# Patient Record
Sex: Female | Born: 1990 | Race: Black or African American | Hispanic: No | Smoking: Never smoker
Health system: Southern US, Community
[De-identification: ages and names within clinical notes are randomized; demographics above are authoritative.]

## PROBLEM LIST (undated history)

## (undated) DIAGNOSIS — E039 Hypothyroidism, unspecified: Secondary | ICD-10-CM

## (undated) DIAGNOSIS — E079 Disorder of thyroid, unspecified: Secondary | ICD-10-CM

## (undated) DIAGNOSIS — G932 Benign intracranial hypertension: Secondary | ICD-10-CM

## (undated) DIAGNOSIS — T783XXA Angioneurotic edema, initial encounter: Secondary | ICD-10-CM

## (undated) DIAGNOSIS — I10 Essential (primary) hypertension: Secondary | ICD-10-CM

## (undated) DIAGNOSIS — M329 Systemic lupus erythematosus, unspecified: Secondary | ICD-10-CM

## (undated) DIAGNOSIS — IMO0002 Reserved for concepts with insufficient information to code with codable children: Secondary | ICD-10-CM

## (undated) DIAGNOSIS — N289 Disorder of kidney and ureter, unspecified: Secondary | ICD-10-CM

## (undated) DIAGNOSIS — M3214 Glomerular disease in systemic lupus erythematosus: Secondary | ICD-10-CM

## (undated) HISTORY — PX: INSERTION OF DIALYSIS CATHETER: SHX1324

## (undated) HISTORY — PX: RENAL BIOPSY: SHX156

## (undated) HISTORY — DX: Angioneurotic edema, initial encounter: T78.3XXA

## (undated) HISTORY — PX: SHOULDER SURGERY: SHX246

---
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
## 2010-12-02 ENCOUNTER — Emergency Department (HOSPITAL_BASED_OUTPATIENT_CLINIC_OR_DEPARTMENT_OTHER)
Admission: EM | Admit: 2010-12-02 | Discharge: 2010-12-02 | Disposition: A | Discharge status: Home / Self Care | Payer: 59 | Attending: Emergency Medicine | Admitting: Emergency Medicine

## 2010-12-02 ENCOUNTER — Encounter: Payer: Self-pay | Admitting: *Deleted

## 2010-12-02 DIAGNOSIS — K589 Irritable bowel syndrome without diarrhea: Secondary | ICD-10-CM | POA: Insufficient documentation

## 2010-12-02 DIAGNOSIS — M549 Dorsalgia, unspecified: Secondary | ICD-10-CM | POA: Insufficient documentation

## 2010-12-02 DIAGNOSIS — R109 Unspecified abdominal pain: Secondary | ICD-10-CM | POA: Insufficient documentation

## 2010-12-02 LAB — BASIC METABOLIC PANEL
CO2: 27 mEq/L (ref 19–32)
Calcium: 8.1 mg/dL — ABNORMAL LOW (ref 8.4–10.5)
Creatinine, Ser: 0.6 mg/dL (ref 0.50–1.10)
GFR calc non Af Amer: >60 mL/min (ref >60)

## 2010-12-02 LAB — URINALYSIS, ROUTINE W REFLEX MICROSCOPIC
Hgb urine dipstick: NEGATIVE
Nitrite: NEGATIVE
Protein, ur: >300 mg/dL — AB
Specific Gravity, Urine: 1.028 (ref 1.005–1.030)
Urobilinogen, UA: 0.2 mg/dL (ref 0.0–1.0)

## 2010-12-02 LAB — URINE MICROSCOPIC-ADD ON

## 2010-12-02 LAB — PREGNANCY, URINE: Preg Test, Ur: NEGATIVE

## 2010-12-02 MED ORDER — DICYCLOMINE HCL 10 MG/ML IM SOLN
20.0000 mg | Freq: Once | INTRAMUSCULAR | Status: AC
  Administered 2010-12-02: 20 mg via INTRAMUSCULAR
  Filled 2010-12-02: qty 2

## 2010-12-02 NOTE — ED Notes (Signed)
Abd pain. Gas and bloating. Hx IBS.

## 2010-12-02 NOTE — ED Provider Notes (Signed)
History     CSN: [id number] Arrival date & time: 12/02/2010  2:31 PM  Chief Complaint  Patient presents with  . Abdominal Pain    (Consider location/radiation/quality/duration/timing/severity/associated sxs/prior treatment) HPI Comments: Pt states that she was being treated for ibs and she quit taking her medication because it didn't seem like it was working pt state that she feels like she is having a recurrence or symptoms  Patient is a 20 y.o. female presenting with abdominal pain. The history is provided by the patient. No language interpreter was used.  Abdominal Pain The primary symptoms of the illness include abdominal pain. The primary symptoms of the illness do not include fever, nausea or vomiting. The current episode started more than 2 days ago. The onset of the illness was sudden. The problem has not changed since onset. The patient states that she believes she is currently not pregnant. Additional symptoms associated with the illness include back pain. Symptoms associated with the illness do not include hematuria. Significant associated medical issues include GERD and inflammatory bowel disease.    Past Medical History  Diagnosis Date  . IBS (irritable bowel syndrome)     History reviewed. No pertinent past surgical history.  No family history on file.  History  Substance Use Topics  . Smoking status: Never Smoker   . Smokeless tobacco: Not on file  . Alcohol Use: No    OB History    Grav Para Term Preterm Abortions TAB SAB Ect Mult Living                  Review of Systems  Constitutional: Negative for fever.  Gastrointestinal: Positive for abdominal pain. Negative for nausea and vomiting.  Genitourinary: Negative for hematuria.  Musculoskeletal: Positive for back pain.  All other systems reviewed and are negative.    Allergies  Review of patient's allergies indicates no known allergies.  Home Medications  No current outpatient prescriptions on  file.  BP 113/69  Pulse 77  Temp(Src) 98.2 F (36.8 C) (Oral)  Resp 20  SpO2 100%  Physical Exam  Nursing note and vitals reviewed. Constitutional: She is oriented to person, place, and time. She appears well-developed and well-nourished.  HENT:  Head: Normocephalic and atraumatic.  Neck: Normal range of motion.  Cardiovascular: Normal rate and regular rhythm.   Pulmonary/Chest: Effort normal and breath sounds normal.  Abdominal: Bowel sounds are normal. There is no tenderness.  Musculoskeletal: Normal range of motion.  Neurological: She is alert and oriented to person, place, and time.  Skin: Skin is warm and dry.  Psychiatric: She has a normal mood and affect.    ED Course  Procedures (including critical care time)  Results for orders placed during the hospital encounter of 12/02/10  URINALYSIS, ROUTINE W REFLEX MICROSCOPIC      Component Value Range   Color, Urine YELLOW  YELLOW    Appearance CLEAR  CLEAR    Specific Gravity, Urine 1.028  1.005 - 1.030    pH 7.5  5.0 - 8.0    Glucose, UA NEGATIVE  NEGATIVE (mg/dL)   Hgb urine dipstick NEGATIVE  NEGATIVE    Bilirubin Urine NEGATIVE  NEGATIVE    Ketones, ur NEGATIVE  NEGATIVE (mg/dL)   Protein, ur >300 (*) NEGATIVE (mg/dL)   Urobilinogen, UA 0.2  0.0 - 1.0 (mg/dL)   Nitrite NEGATIVE  NEGATIVE    Leukocytes, UA NEGATIVE  NEGATIVE   PREGNANCY, URINE      Component Value Range  Preg Test, Ur NEGATIVE    BASIC METABOLIC PANEL      Component Value Range   Sodium 139  135 - 145 (mEq/L)   Potassium 3.5  3.5 - 5.1 (mEq/L)   Chloride 108  96 - 112 (mEq/L)   CO2 27  19 - 32 (mEq/L)   Glucose, Bld 107 (*) 70 - 99 (mg/dL)   BUN 8  6 - 23 (mg/dL)   Creatinine, Ser 0.60  0.50 - 1.10 (mg/dL)   Calcium 8.1 (*) 8.4 - 10.5 (mg/dL)   GFR calc non Af Amer >60  >60 (mL/min)   GFR calc Af Amer >60  >60 (mL/min)  URINE MICROSCOPIC-ADD ON      Component Value Range   Squamous Epithelial / LPF RARE  RARE    Bacteria, UA RARE   RARE    No results found.    No diagnosis found.    MDM  Pt is aware of her proteinuria and is being worked up by her pcp:pt in no acute distress:likely related to ibs;abdomen non surgical       Glendell Docker, NP 12/02/10 1557

## 2010-12-02 NOTE — ED Provider Notes (Signed)
Medical screening examination/treatment/procedure(s) were performed by non-physician practitioner and as supervising physician I was immediately available for consultation/collaboration.   Trisha Mangle, MD 12/02/10 1558

## 2010-12-02 NOTE — ED Notes (Signed)
Pt. Aware of results with BMP and Urine test

## 2011-09-04 ENCOUNTER — Emergency Department (HOSPITAL_BASED_OUTPATIENT_CLINIC_OR_DEPARTMENT_OTHER)
Admission: EM | Admit: 2011-09-04 | Discharge: 2011-09-05 | Disposition: A | Discharge status: Home / Self Care | Payer: 59 | Attending: Emergency Medicine | Admitting: Emergency Medicine

## 2011-09-04 ENCOUNTER — Emergency Department (HOSPITAL_BASED_OUTPATIENT_CLINIC_OR_DEPARTMENT_OTHER): Payer: 59

## 2011-09-04 ENCOUNTER — Encounter (HOSPITAL_BASED_OUTPATIENT_CLINIC_OR_DEPARTMENT_OTHER): Payer: Self-pay | Admitting: *Deleted

## 2011-09-04 DIAGNOSIS — R05 Cough: Secondary | ICD-10-CM

## 2011-09-04 DIAGNOSIS — R0982 Postnasal drip: Secondary | ICD-10-CM

## 2011-09-04 DIAGNOSIS — R059 Cough, unspecified: Secondary | ICD-10-CM | POA: Insufficient documentation

## 2011-09-04 DIAGNOSIS — Z79899 Other long term (current) drug therapy: Secondary | ICD-10-CM | POA: Insufficient documentation

## 2011-09-04 NOTE — ED Provider Notes (Signed)
History  This chart was scribed for [name], MD by Lovena Le Day. This patient was seen in room MH02/MH02 and the patient's care was started at 2317.  CSN: [id number]  Arrival date & time 09/04/11  2229   First MD Initiated Contact with Patient 09/04/11 2317      Chief Complaint  Patient presents with  . Cough     Patient is a 21 y.o. female presenting with cough. The history is provided by the patient. No language interpreter was used.  Cough This is a new problem. The current episode started more than 1 week ago. The problem occurs constantly. The problem has not changed since onset.The cough is productive of sputum. There has been no fever. Pertinent negatives include no chest pain, no chills, no headaches, no sore throat, no shortness of breath and no eye redness. She has tried nothing for the symptoms. The treatment provided no relief. Risk factors: road-trip to [facility] and stopped somewhere at a gas-station halfway there.  Her past medical history does not include COPD.    Anne Robinson is a 21 y.o. female who presents to the Emergency Department complaining of one week of gradual onset, gradually worsening, constant productive cough of yellow to clear mucus. She denies having any modifying factors. She reports taking benadryl and Claritin with no relief in her symptoms. She has not seen a MD for the symptoms prior to today. She reports a recent car trip to [place] one week ago but denies any new leg swelling. She is currently on oral contraceptive. She denies wheezing, sore throat, nausea, HA, back pain and urinary symptoms as associated symptoms. She has a h/o IBS. She denies smoking and alcohol use.  Pt followed by [facility]   Past Medical History  Diagnosis Date  . IBS (irritable bowel syndrome)     History reviewed. No pertinent past surgical history.  History reviewed. No pertinent family history.  History  Substance Use Topics  . Smoking status: Never  Smoker   . Smokeless tobacco: Not on file  . Alcohol Use: No    No OB history provided.  Review of Systems  Constitutional: Negative for fever and chills.  HENT: Negative for congestion, sore throat and neck pain.   Eyes: Negative for redness and itching.  Respiratory: Positive for cough. Negative for chest tightness and shortness of breath.   Cardiovascular: Negative for chest pain, palpitations and leg swelling.  Gastrointestinal: Negative for nausea, vomiting, abdominal pain and diarrhea.  Genitourinary: Negative for dysuria, urgency and hematuria.  Musculoskeletal: Negative for back pain.  Skin: Negative for rash.  Neurological: Negative for light-headedness and headaches.  All other systems reviewed and are negative.    Allergies  Review of patient's allergies indicates no known allergies.  Home Medications   Current Outpatient Rx  Name Route Sig Dispense Refill  . DIPHENHYDRAMINE HCL 25 MG PO TABS Oral Take 25 mg by mouth every 6 (six) hours as needed. For cough    . LEVONORGEST-ETH ESTRAD 91-DAY 0.15-0.03 MG PO TABS Oral Take 1 tablet by mouth daily.    Marland Kitchen LISINOPRIL 20 MG PO TABS Oral Take 20 mg by mouth daily.    Marland Kitchen LORATADINE 10 MG PO TABS Oral Take 10 mg by mouth daily.    Marland Kitchen MYCOPHENOLATE MOFETIL 500 MG PO TABS Oral Take 500 mg by mouth 2 (two) times daily.    Marland Kitchen OMEPRAZOLE 20 MG PO CPDR Oral Take 20 mg by mouth daily.    Marland Kitchen  POTASSIUM PO Oral Take 1 tablet by mouth daily as needed. For low potassium    . PREDNISONE 20 MG PO TABS Oral Take 20 mg by mouth daily.    Marland Kitchen VITAMIN D (CHOLECALCIFEROL) PO Oral Take 1 tablet by mouth daily.      Triage Vitals: BP 146/115  Pulse 103  Temp 98.4 F (36.9 C) (Oral)  Resp 18  Ht 4\' 11"  (1.499 m)  Wt 230 lb (104.327 kg)  BMI 46.45 kg/m2  SpO2 100%  LMP 08/12/2011  Physical Exam  Nursing note and vitals reviewed. Constitutional: She is oriented to person, place, and time. She appears well-developed and well-nourished. No  distress.  HENT:  Head: Normocephalic.  Right Ear: External ear normal.  Mouth/Throat: Oropharynx is clear and moist. No oropharyngeal exudate.       [place] noted, moist mucus membranes, normal TMs bilaterally  Eyes: Conjunctivae and EOM are normal. Pupils are equal, round, and reactive to light.       Cobblestoning of posterior oropharynx  Neck: Normal range of motion. Neck supple. No tracheal deviation present.  Cardiovascular: Normal rate and regular rhythm.  Exam reveals no gallop and no friction rub.   No murmur heard. Pulmonary/Chest: Effort normal and breath sounds normal. No respiratory distress. She has no wheezes. She exhibits no tenderness.  Abdominal: Soft. Bowel sounds are normal. She exhibits no distension. There is no tenderness. There is no rebound and no guarding.  Musculoskeletal: Normal range of motion. She exhibits no edema.  Lymphadenopathy:    She has no cervical adenopathy.  Neurological: She is alert and oriented to person, place, and time. She has normal reflexes. No cranial nerve deficit.  Skin: Skin is warm and dry.  Psychiatric: She has a normal mood and affect. Her behavior is normal.    ED Course  Procedures (including critical care time)  DIAGNOSTIC STUDIES: Oxygen Saturation is 100% on room air, normal by my interpretation.    COORDINATION OF CARE: 12:15AM-Discussed treatment plan which includes a chest x-ray and blood work with pt and pt agreed to plan.   Labs Reviewed  CBC WITH DIFFERENTIAL - Abnormal; Notable for the following:    Hemoglobin 11.8 (*)     HCT 35.9 (*)     All other components within normal limits  BASIC METABOLIC PANEL - Abnormal; Notable for the following:    Glucose, Bld 116 (*)     All other components within normal limits  PREGNANCY, URINE  D-DIMER, QUANTITATIVE   Dg Chest 2 View  09/04/2011  *RADIOLOGY REPORT*  Clinical Data: Cough.  CHEST - 2 VIEW  Comparison: None.  Findings: Heart and mediastinal contours are  within normal limits. No focal opacities or effusions.  No acute bony abnormality.  IMPRESSION: No active cardiopulmonary disease.  Original Report Authenticated By: Raelyn Number, M.D.     No diagnosis found.    MDM  Pt's symptoms are consistent with post nasal drip/   Advised pt to follow up with PCP and return if she develops fevers, chills, hemoptysis, chest pain or shortness of breath.  Patient verbalizes understanding and agrees to follow up      I personally performed the services described in this documentation, which was scribed in my presence. The recorded information has been reviewed and considered.    Carlisle Beers, MD 09/05/11 [phone]

## 2011-09-04 NOTE — ED Notes (Signed)
Pt has dry cough in triage

## 2011-09-04 NOTE — ED Notes (Signed)
Pt has no hx of asthma or COPD but presented to the ED with a slight tickle in her throat.

## 2011-09-04 NOTE — ED Notes (Signed)
Pt presents to ED today with cough for the last week.  Pt has tried several OTC meds with no relief in sx.  Pt states this happens every year but seems worse this time.

## 2011-09-05 LAB — CBC WITH DIFFERENTIAL/PLATELET
Basophils Relative: 0 % (ref 0–1)
Eosinophils Relative: 0 % (ref 0–5)
Hemoglobin: 11.8 g/dL — ABNORMAL LOW (ref 12.0–15.0)
Lymphs Abs: 3 10*3/uL (ref 0.7–4.0)
MCH: 29.6 pg (ref 26.0–34.0)
MCHC: 32.9 g/dL (ref 30.0–36.0)
MCV: 90.2 fL (ref 78.0–100.0)
Monocytes Absolute: 0.7 10*3/uL (ref 0.1–1.0)
Platelets: 274 10*3/uL (ref 150–400)
RBC: 3.98 MIL/uL (ref 3.87–5.11)

## 2011-09-05 LAB — BASIC METABOLIC PANEL
BUN: 13 mg/dL (ref 6–23)
CO2: 27 mEq/L (ref 19–32)
Chloride: 105 mEq/L (ref 96–112)
Creatinine, Ser: 0.8 mg/dL (ref 0.50–1.10)

## 2011-09-05 MED ORDER — BENZONATATE 100 MG PO CAPS: 100.0000 mg | ORAL_CAPSULE | Freq: Three times a day (TID) | ORAL | Status: AC

## 2011-09-05 MED ORDER — FLUTICASONE PROPIONATE 50 MCG/ACT NA SUSP: 2.0000 | Freq: Every day | NASAL | Status: DC

## 2011-09-05 NOTE — Discharge Instructions (Signed)
Cool Mist Vaporizers Vaporizers may help relieve the symptoms of a cough and cold. By adding water to the air, mucus may become thinner and less sticky. This makes it easier to breathe and cough up secretions. Vaporizers have not been proven to show they help with colds. You should not use a vaporizer if you are allergic to mold. Cool mist vaporizers do not cause serious burns like hot mist vaporizers ("steamers"). HOME CARE INSTRUCTIONS  Follow the package instructions for your vaporizer.   Use a vaporizer that holds a large volume of water (1 to 2 gallons [5.7 to 7.5 liters]).   Do not use anything other than distilled water in the vaporizer.   Do not run the vaporizer all of the time. This can cause mold or bacteria to grow in the vaporizer.   Clean the vaporizer after each time you use it.   Clean and dry the vaporizer well before you store it.   Stop using a vaporizer if you develop worsening respiratory symptoms.  Document Released: 11/19/2003 Document Revised: 02/10/2011 Document Reviewed: 10/16/2008 [facility] Patient Information 2012 [place].Cough, Adult  A cough is a reflex. It helps you clear your throat and airways. A cough can help heal your body. A cough can last 2 or 3 weeks (acute) or may last more than 8 weeks (chronic). Some common causes of a cough can include an infection, allergy, or a cold. HOME CARE  Only take medicine as told by your doctor.   If given, take your medicines (antibiotics) as told. Finish them even if you start to feel better.   Use a cold steam vaporizer or humidier in your home. This can help loosen thick spit (secretions).   Sleep so you are almost sitting up (semi-upright). Use pillows to do this. This helps reduce coughing.   Rest as needed.   Stop smoking if you smoke.  GET HELP RIGHT AWAY IF:  You have yellowish-white fluid (pus) in your thick spit.   Your cough gets worse.   Your medicine does not reduce coughing, and you are  losing sleep.   You cough up blood.   You have trouble breathing.   Your pain gets worse and medicine does not help.   You have a fever.  MAKE SURE YOU:   Understand these instructions.   Will watch your condition.   Will get help right away if you are not doing well or get worse.  Document Released: 11/04/2010 Document Revised: 02/10/2011 Document Reviewed: 11/04/2010 [facility] Patient Information 2012 [place].

## 2011-10-06 ENCOUNTER — Emergency Department (HOSPITAL_BASED_OUTPATIENT_CLINIC_OR_DEPARTMENT_OTHER)
Admission: EM | Admit: 2011-10-06 | Discharge: 2011-10-06 | Disposition: A | Discharge status: Home / Self Care | Payer: Self-pay | Attending: Emergency Medicine | Admitting: Emergency Medicine

## 2011-10-06 ENCOUNTER — Encounter (HOSPITAL_BASED_OUTPATIENT_CLINIC_OR_DEPARTMENT_OTHER): Payer: Self-pay | Admitting: Emergency Medicine

## 2011-10-06 DIAGNOSIS — E860 Dehydration: Secondary | ICD-10-CM | POA: Insufficient documentation

## 2011-10-06 DIAGNOSIS — R112 Nausea with vomiting, unspecified: Secondary | ICD-10-CM | POA: Insufficient documentation

## 2011-10-06 DIAGNOSIS — M329 Systemic lupus erythematosus, unspecified: Secondary | ICD-10-CM | POA: Insufficient documentation

## 2011-10-06 DIAGNOSIS — K589 Irritable bowel syndrome without diarrhea: Secondary | ICD-10-CM | POA: Insufficient documentation

## 2011-10-06 HISTORY — DX: Systemic lupus erythematosus, unspecified: M32.9

## 2011-10-06 HISTORY — DX: Reserved for concepts with insufficient information to code with codable children: IMO0002

## 2011-10-06 LAB — URINALYSIS, ROUTINE W REFLEX MICROSCOPIC
Glucose, UA: NEGATIVE mg/dL
Leukocytes, UA: NEGATIVE
Nitrite: NEGATIVE
Protein, ur: >300 mg/dL — AB
Urobilinogen, UA: 1 mg/dL (ref 0.0–1.0)

## 2011-10-06 LAB — COMPREHENSIVE METABOLIC PANEL
Albumin: 1.2 g/dL — ABNORMAL LOW (ref 3.5–5.2)
BUN: 10 mg/dL (ref 6–23)
Chloride: 108 mEq/L (ref 96–112)
Creatinine, Ser: 0.8 mg/dL (ref 0.50–1.10)
Total Bilirubin: 0.2 mg/dL — ABNORMAL LOW (ref 0.3–1.2)
Total Protein: 6.1 g/dL (ref 6.0–8.3)

## 2011-10-06 LAB — CBC WITH DIFFERENTIAL/PLATELET
Basophils Relative: 0 % (ref 0–1)
Eosinophils Absolute: 0 10*3/uL (ref 0.0–0.7)
HCT: 43.4 % (ref 36.0–46.0)
Hemoglobin: 14.5 g/dL (ref 12.0–15.0)
MCH: 29.2 pg (ref 26.0–34.0)
MCHC: 33.4 g/dL (ref 30.0–36.0)
MCV: 87.5 fL (ref 78.0–100.0)
Monocytes Absolute: 0.4 10*3/uL (ref 0.1–1.0)
Neutro Abs: 2.3 10*3/uL (ref 1.7–7.7)

## 2011-10-06 LAB — PREGNANCY, URINE: Preg Test, Ur: NEGATIVE

## 2011-10-06 LAB — URINE MICROSCOPIC-ADD ON

## 2011-10-06 MED ORDER — KETOROLAC TROMETHAMINE 30 MG/ML IJ SOLN
30.0000 mg | Freq: Once | INTRAMUSCULAR | Status: AC
  Administered 2011-10-06: 30 mg via INTRAVENOUS
  Filled 2011-10-06: qty 1

## 2011-10-06 MED ORDER — PROMETHAZINE HCL 25 MG PO TABS: 25.0000 mg | ORAL_TABLET | Freq: Four times a day (QID) | ORAL | Status: DC | PRN

## 2011-10-06 MED ORDER — SODIUM CHLORIDE 0.9 % IV SOLN
Freq: Once | INTRAVENOUS | Status: AC
  Administered 2011-10-06: 15:00:00 via INTRAVENOUS

## 2011-10-06 MED ORDER — ONDANSETRON HCL 4 MG/2ML IJ SOLN
4.0000 mg | Freq: Once | INTRAMUSCULAR | Status: AC
  Administered 2011-10-06: 4 mg via INTRAVENOUS
  Filled 2011-10-06: qty 2

## 2011-10-06 NOTE — ED Notes (Signed)
States yesterday had episode of n/v without diarrhea.  Thinks its because she stopped medicine.  States she has not vomited today but has nausea.

## 2011-10-06 NOTE — ED Provider Notes (Signed)
History     CSN: [id number]  Arrival date & time 10/06/11  1401   First MD Initiated Contact with Patient 10/06/11 1414      Chief Complaint  Patient presents with  . Emesis    (Consider location/radiation/quality/duration/timing/severity/associated sxs/prior treatment) Patient is a 21 y.o. female presenting with vomiting. The history is provided by the patient. No language interpreter was used.  Emesis  This is a new problem. The current episode started yesterday. The problem occurs 5 to 10 times per day. The problem has been gradually improving. The emesis has an appearance of stomach contents. There has been no fever. Pertinent negatives include no abdominal pain, no chills and no diarrhea.  Pt complains of feeling bloated and decreased urine output.  Pt reports she has had a kidney biopsy.   Pt reports she is followed by a nephrologist but is not currently being seen.   Pt stopped taking her medications.  Pt thinks she may be sick because she stop medications.  (Pt now has rx's)   Past Medical History  Diagnosis Date  . IBS (irritable bowel syndrome)   . Lupus     Past Surgical History  Procedure Date  . Renal biopsy     No family history on file.  History  Substance Use Topics  . Smoking status: Never Smoker   . Smokeless tobacco: Not on file  . Alcohol Use: No    OB History    Grav Para Term Preterm Abortions TAB SAB Ect Mult Living                  Review of Systems  Constitutional: Negative for chills.  Gastrointestinal: Positive for vomiting. Negative for abdominal pain and diarrhea.  Genitourinary: Positive for decreased urine volume.  All other systems reviewed and are negative.    Allergies  Review of patient's allergies indicates no known allergies.  Home Medications   Current Outpatient Rx  Name Route Sig Dispense Refill  . DIPHENHYDRAMINE HCL 25 MG PO TABS Oral Take 25 mg by mouth every 6 (six) hours as needed. For cough    . FLUTICASONE  PROPIONATE 50 MCG/ACT NA SUSP Nasal Place 2 sprays into the nose daily. 16 g 0  . LEVONORGEST-ETH ESTRAD 91-DAY 0.15-0.03 MG PO TABS Oral Take 1 tablet by mouth daily.    Marland Kitchen LISINOPRIL 20 MG PO TABS Oral Take 20 mg by mouth daily.    Marland Kitchen LORATADINE 10 MG PO TABS Oral Take 10 mg by mouth daily.    Marland Kitchen MYCOPHENOLATE MOFETIL 500 MG PO TABS Oral Take 500 mg by mouth 2 (two) times daily.    Marland Kitchen OMEPRAZOLE 20 MG PO CPDR Oral Take 20 mg by mouth daily.    Marland Kitchen POTASSIUM PO Oral Take 1 tablet by mouth daily as needed. For low potassium    . PREDNISONE 20 MG PO TABS Oral Take 20 mg by mouth daily.    Marland Kitchen VITAMIN D (CHOLECALCIFEROL) PO Oral Take 1 tablet by mouth daily.      BP 117/102  Pulse 73  Temp 98.2 F (36.8 C) (Oral)  Resp 16  Ht 4\' 11"  (1.499 m)  Wt 244 lb 8 oz (110.904 kg)  BMI 49.38 kg/m2  SpO2 97%  LMP 09/19/2011  Physical Exam  Nursing note and vitals reviewed. Constitutional: She is oriented to person, place, and time. She appears well-developed and well-nourished.  HENT:  Head: Normocephalic and atraumatic.  Right Ear: External ear normal.  Left Ear: External  ear normal.  Nose: Nose normal.  Mouth/Throat: Oropharynx is clear and moist.  Eyes: Conjunctivae and EOM are normal. Pupils are equal, round, and reactive to light.  Neck: Normal range of motion. Neck supple.  Cardiovascular: Normal rate and normal heart sounds.   Pulmonary/Chest: Effort normal and breath sounds normal.  Abdominal: Soft. There is no tenderness.  Musculoskeletal: Normal range of motion.  Neurological: She is alert and oriented to person, place, and time. She has normal reflexes.  Skin: Skin is warm and dry.  Psychiatric: She has a normal mood and affect.    ED Course  Procedures (including critical care time)  Labs Reviewed - No data to display No results found.   No diagnosis found.  Results for orders placed during the hospital encounter of 10/06/11  CBC WITH DIFFERENTIAL      Component Value  Range   WBC 4.1  4.0 - 10.5 K/uL   RBC 4.96  3.87 - 5.11 MIL/uL   Hemoglobin 14.5  12.0 - 15.0 g/dL   HCT 43.4  36.0 - 46.0 %   MCV 87.5  78.0 - 100.0 fL   MCH 29.2  26.0 - 34.0 pg   MCHC 33.4  30.0 - 36.0 g/dL   RDW 14.1  11.5 - 15.5 %   Platelets 289  150 - 400 K/uL   Neutrophils Relative 57  43 - 77 %   Lymphocytes Relative 34  12 - 46 %   Monocytes Relative 9  3 - 12 %   Eosinophils Relative 0  0 - 5 %   Basophils Relative 0  0 - 1 %   Neutro Abs 2.3  1.7 - 7.7 K/uL   Lymphs Abs 1.4  0.7 - 4.0 K/uL   Monocytes Absolute 0.4  0.1 - 1.0 K/uL   Eosinophils Absolute 0.0  0.0 - 0.7 K/uL   Basophils Absolute 0.0  0.0 - 0.1 K/uL  COMPREHENSIVE METABOLIC PANEL      Component Value Range   Sodium 141  135 - 145 mEq/L   Potassium 3.8  3.5 - 5.1 mEq/L   Chloride 108  96 - 112 mEq/L   CO2 25  19 - 32 mEq/L   Glucose, Bld 97  70 - 99 mg/dL   BUN 10  6 - 23 mg/dL   Creatinine, Ser 0.80  0.50 - 1.10 mg/dL   Calcium 8.3 (*) 8.4 - 10.5 mg/dL   Total Protein 6.1  6.0 - 8.3 g/dL   Albumin 1.2 (*) 3.5 - 5.2 g/dL   AST 19  0 - 37 U/L   ALT 10  0 - 35 U/L   Alkaline Phosphatase 82  39 - 117 U/L   Total Bilirubin 0.2 (*) 0.3 - 1.2 mg/dL   GFR calc non Af Amer >90  >90 mL/min   GFR calc Af Amer >90  >90 mL/min  URINALYSIS, ROUTINE W REFLEX MICROSCOPIC      Component Value Range   Color, Urine AMBER (*) YELLOW   APPearance CLEAR  CLEAR   Specific Gravity, Urine >1.046 (*) 1.005 - 1.030   pH 7.0  5.0 - 8.0   Glucose, UA NEGATIVE  NEGATIVE mg/dL   Hgb urine dipstick SMALL (*) NEGATIVE   Bilirubin Urine SMALL (*) NEGATIVE   Ketones, ur 15 (*) NEGATIVE mg/dL   Protein, ur >300 (*) NEGATIVE mg/dL   Urobilinogen, UA 1.0  0.0 - 1.0 mg/dL   Nitrite NEGATIVE  NEGATIVE   Leukocytes, UA NEGATIVE  NEGATIVE  PREGNANCY, URINE      Component Value Range   Preg Test, Ur NEGATIVE  NEGATIVE  URINE MICROSCOPIC-ADD ON      Component Value Range   Squamous Epithelial / LPF RARE  RARE   RBC / HPF 3-6   <3 RBC/hpf   Bacteria, UA RARE  RARE   No results found.   MDM  Pt given 2 liters IV fluids, pain and nausea have resolved.  Pt advised she needs to continue her medications.   Pt will see her Md for recheck        Fransico Meadow, [place] 10/06/11 1815

## 2011-10-06 NOTE — ED Notes (Signed)
Pt states that she is unable to give urine at this time d/t dehydration

## 2011-10-07 NOTE — ED Provider Notes (Signed)
Medical screening examination/treatment/procedure(s) were performed by non-physician practitioner and as supervising physician I was immediately available for consultation/collaboration.   Ezequiel Essex, MD 10/07/11 [phone]

## 2011-10-09 ENCOUNTER — Emergency Department (HOSPITAL_BASED_OUTPATIENT_CLINIC_OR_DEPARTMENT_OTHER): Payer: Self-pay

## 2011-10-09 ENCOUNTER — Encounter (HOSPITAL_BASED_OUTPATIENT_CLINIC_OR_DEPARTMENT_OTHER): Payer: Self-pay | Admitting: *Deleted

## 2011-10-09 ENCOUNTER — Emergency Department (HOSPITAL_BASED_OUTPATIENT_CLINIC_OR_DEPARTMENT_OTHER)
Admission: EM | Admit: 2011-10-09 | Discharge: 2011-10-09 | Disposition: A | Discharge status: Home / Self Care | Payer: Self-pay | Attending: Emergency Medicine | Admitting: Emergency Medicine

## 2011-10-09 DIAGNOSIS — K8020 Calculus of gallbladder without cholecystitis without obstruction: Secondary | ICD-10-CM | POA: Insufficient documentation

## 2011-10-09 DIAGNOSIS — M329 Systemic lupus erythematosus, unspecified: Secondary | ICD-10-CM | POA: Insufficient documentation

## 2011-10-09 DIAGNOSIS — K589 Irritable bowel syndrome without diarrhea: Secondary | ICD-10-CM | POA: Insufficient documentation

## 2011-10-09 LAB — CBC WITH DIFFERENTIAL/PLATELET
Basophils Absolute: 0 10*3/uL (ref 0.0–0.1)
Basophils Relative: 0 % (ref 0–1)
MCHC: 33.7 g/dL (ref 30.0–36.0)
Monocytes Absolute: 0.3 10*3/uL (ref 0.1–1.0)
Neutro Abs: 4.2 10*3/uL (ref 1.7–7.7)
Neutrophils Relative %: 77 % (ref 43–77)
RDW: 13.8 % (ref 11.5–15.5)

## 2011-10-09 LAB — URINE MICROSCOPIC-ADD ON

## 2011-10-09 LAB — COMPREHENSIVE METABOLIC PANEL
AST: 22 U/L (ref 0–37)
Albumin: 1.2 g/dL — ABNORMAL LOW (ref 3.5–5.2)
Chloride: 106 mEq/L (ref 96–112)
Creatinine, Ser: 0.7 mg/dL (ref 0.50–1.10)
Potassium: 3.8 mEq/L (ref 3.5–5.1)
Total Bilirubin: 0.1 mg/dL — ABNORMAL LOW (ref 0.3–1.2)

## 2011-10-09 LAB — URINALYSIS, ROUTINE W REFLEX MICROSCOPIC
Bilirubin Urine: NEGATIVE
Glucose, UA: NEGATIVE mg/dL
Ketones, ur: 15 mg/dL — AB
pH: 6.5 (ref 5.0–8.0)

## 2011-10-09 MED ORDER — ONDANSETRON HCL 4 MG/2ML IJ SOLN
4.0000 mg | Freq: Once | INTRAMUSCULAR | Status: AC
Start: 2011-10-09 — End: 2011-10-09
  Administered 2011-10-09: 4 mg via INTRAVENOUS
  Filled 2011-10-09: qty 2

## 2011-10-09 MED ORDER — GI COCKTAIL ~~LOC~~
30.0000 mL | Freq: Once | ORAL | Status: AC
  Administered 2011-10-09: 30 mL via ORAL
  Filled 2011-10-09: qty 30

## 2011-10-09 MED ORDER — HYDROCODONE-ACETAMINOPHEN 5-500 MG PO TABS: 1.0000 | ORAL_TABLET | Freq: Four times a day (QID) | ORAL | Status: AC | PRN

## 2011-10-09 NOTE — ED Provider Notes (Signed)
History     CSN: [id number]  Arrival date & time 10/09/11  1925   First MD Initiated Contact with Patient 10/09/11 1944      Chief Complaint  Patient presents with  . Abdominal Pain    (Consider location/radiation/quality/duration/timing/severity/associated sxs/prior treatment) HPI Comments: Pt states that she was seen a couple of days ago with epigastric pain and vomiting:pt states that she has been eating very mild food but she is continuing to have the symptoms:pt states that she has not had any fever:pt states that she sees a nephrologist for mild lupus  Patient is a 21 y.o. female presenting with abdominal pain. The history is provided by the patient. No language interpreter was used.  Abdominal Pain The primary symptoms of the illness include abdominal pain and nausea. The primary symptoms of the illness do not include vomiting, diarrhea or dysuria. The current episode started more than 2 days ago. The onset of the illness was gradual. The problem has not changed since onset. The patient states that she believes she is currently not pregnant. The patient has not had a change in bowel habit. Significant associated medical issues include GERD.    Past Medical History  Diagnosis Date  . IBS (irritable bowel syndrome)   . Lupus     Past Surgical History  Procedure Date  . Renal biopsy     History reviewed. No pertinent family history.  History  Substance Use Topics  . Smoking status: Never Smoker   . Smokeless tobacco: Not on file  . Alcohol Use: No    OB History    Grav Para Term Preterm Abortions TAB SAB Ect Mult Living                  Review of Systems  Constitutional: Negative.   Respiratory: Negative.   Cardiovascular: Negative.   Gastrointestinal: Positive for nausea and abdominal pain. Negative for vomiting and diarrhea.  Genitourinary: Negative for dysuria.    Allergies  Review of patient's allergies indicates no known allergies.  Home Medications    Current Outpatient Rx  Name Route Sig Dispense Refill  . FLUTICASONE PROPIONATE 50 MCG/ACT NA SUSP Nasal Place 2 sprays into the nose daily. 16 g 0  . LEVONORGEST-ETH ESTRAD 91-DAY 0.15-0.03 MG PO TABS Oral Take 1 tablet by mouth daily.    Marland Kitchen LORATADINE 10 MG PO TABS Oral Take 10 mg by mouth daily.    Marland Kitchen OMEPRAZOLE 20 MG PO CPDR Oral Take 20 mg by mouth daily.    Marland Kitchen GAS-X PO Oral Take 1 tablet by mouth daily as needed. For flatulence.      BP 121/76  Pulse 92  Temp 98.1 F (36.7 C) (Oral)  Resp 20  Ht 4\' 11"  (1.499 m)  Wt 244 lb (110.678 kg)  BMI 49.28 kg/m2  SpO2 98%  LMP 09/19/2011  Physical Exam  Nursing note and vitals reviewed. Constitutional: She is oriented to person, place, and time. She appears well-developed and well-nourished.  HENT:  Head: Atraumatic.  Eyes: Conjunctivae and EOM are normal.  Neck: Neck supple.  Cardiovascular: Normal rate and regular rhythm.   Pulmonary/Chest: Effort normal and breath sounds normal.  Abdominal: Soft. Bowel sounds are normal. There is tenderness in the right upper quadrant and epigastric area.  Musculoskeletal: Normal range of motion.  Neurological: She is alert and oriented to person, place, and time.  Skin: Skin is warm and dry.  Psychiatric: She has a normal mood and affect.    ED  Course  Procedures (including critical care time)  Labs Reviewed  URINALYSIS, ROUTINE W REFLEX MICROSCOPIC - Abnormal; Notable for the following:    Hgb urine dipstick MODERATE (*)     Ketones, ur 15 (*)     Protein, ur >300 (*)     All other components within normal limits  COMPREHENSIVE METABOLIC PANEL - Abnormal; Notable for the following:    Glucose, Bld 106 (*)     Calcium 8.3 (*)     Total Protein 5.9 (*)     Albumin 1.2 (*)     Total Bilirubin 0.1 (*)     All other components within normal limits  URINE MICROSCOPIC-ADD ON - Abnormal; Notable for the following:    Squamous Epithelial / LPF FEW (*)     Bacteria, UA MANY (*)      Casts HYALINE CASTS (*)     All other components within normal limits  PREGNANCY, URINE  CBC WITH DIFFERENTIAL  LIPASE, BLOOD   US Abdomen Complete  10/09/2011  *RADIOLOGY REPORT*  Clinical Data:  Right upper quadrant pain  ABDOMINAL ULTRASOUND COMPLETE  Comparison:  None.  Findings:  Gallbladder:  Echogenic shadowing gallstone noted measuring 12 mm. No associated Murphy's sign or wall thickening.  No pericholecystic fluid.  Wall thickness measures 2 mm.  Common Bile Duct:  Within normal limits in caliber.  Liver: No focal mass lesion identified.  Within normal limits in parenchymal echogenicity.  IVC:  Appears normal.  Pancreas:  No abnormality identified.  Spleen:  Within normal limits in size and echotexture.  Right kidney:  Normal in size and parenchymal echogenicity.  No evidence of mass or hydronephrosis.  Left kidney:  Normal in size and parenchymal echogenicity.  No evidence of mass or hydronephrosis.  Abdominal Aorta:  No aneurysm identified.  IMPRESSION: Limited exam because of patient body habitus and bowel gas.  Cholelithiasis.  No other acute finding.  Original Report Authenticated By: Jerilynn Mages. Daryll Brod, M.D.     1. Cholelithiases       MDM  Pt comfortable at this time:urine is consistent with previous and pt is follow by neurology:pt is okay to follow up with sugery        Glendell Docker, NP 10/09/11 2240

## 2011-10-09 NOTE — ED Notes (Signed)
Patient reports that she has abd pain after every time she eats.

## 2011-10-09 NOTE — ED Notes (Signed)
Pt presents to ED today without indwelling IV catheter.  Not charted as removed from previous [facility] encounter.

## 2011-10-09 NOTE — ED Provider Notes (Signed)
History/physical exam/procedure(s) were performed by non-physician practitioner and as supervising physician I was immediately available for consultation/collaboration. I have reviewed all notes and am in agreement with care and plan.   Shaune Pollack, MD 10/09/11 [phone]

## 2011-10-09 NOTE — ED Notes (Signed)
Pt presents to ED today with epigastric upper abdominal pain since Thurs.  Pt has been seen here recently for same

## 2012-07-17 ENCOUNTER — Encounter (HOSPITAL_BASED_OUTPATIENT_CLINIC_OR_DEPARTMENT_OTHER): Payer: Self-pay

## 2012-07-17 ENCOUNTER — Emergency Department (HOSPITAL_BASED_OUTPATIENT_CLINIC_OR_DEPARTMENT_OTHER)
Admission: EM | Admit: 2012-07-17 | Discharge: 2012-07-17 | Disposition: A | Discharge status: Home / Self Care | Payer: 59 | Attending: Emergency Medicine | Admitting: Emergency Medicine

## 2012-07-17 DIAGNOSIS — IMO0002 Reserved for concepts with insufficient information to code with codable children: Secondary | ICD-10-CM | POA: Insufficient documentation

## 2012-07-17 DIAGNOSIS — J029 Acute pharyngitis, unspecified: Secondary | ICD-10-CM | POA: Insufficient documentation

## 2012-07-17 DIAGNOSIS — J3489 Other specified disorders of nose and nasal sinuses: Secondary | ICD-10-CM | POA: Insufficient documentation

## 2012-07-17 DIAGNOSIS — J019 Acute sinusitis, unspecified: Secondary | ICD-10-CM | POA: Insufficient documentation

## 2012-07-17 DIAGNOSIS — Z79899 Other long term (current) drug therapy: Secondary | ICD-10-CM | POA: Insufficient documentation

## 2012-07-17 DIAGNOSIS — Z8739 Personal history of other diseases of the musculoskeletal system and connective tissue: Secondary | ICD-10-CM | POA: Insufficient documentation

## 2012-07-17 DIAGNOSIS — Z87448 Personal history of other diseases of urinary system: Secondary | ICD-10-CM | POA: Insufficient documentation

## 2012-07-17 DIAGNOSIS — Z8719 Personal history of other diseases of the digestive system: Secondary | ICD-10-CM | POA: Insufficient documentation

## 2012-07-17 HISTORY — DX: Glomerular disease in systemic lupus erythematosus: M32.14

## 2012-07-17 HISTORY — DX: Disorder of kidney and ureter, unspecified: N28.9

## 2012-07-17 MED ORDER — AZITHROMYCIN 250 MG PO TABS: 250.0000 mg | ORAL_TABLET | Freq: Every day | ORAL | Status: DC

## 2012-07-17 NOTE — ED Provider Notes (Signed)
History     CSN: [id number]  Arrival date & time 07/17/12  [id number]   First MD Initiated Contact with Patient 07/17/12 0914      Chief Complaint  Patient presents with  . Facial Pain    (Consider location/radiation/quality/duration/timing/severity/associated sxs/prior treatment) HPI Comments: Patient presents with a one month history of sinus congestion, facial pain.  She has seasonal allergies but is getting no relief with otc meds, allergy pills.  No fevers or chills.  Patient is a 22 y.o. female presenting with URI. The history is provided by the patient.  URI Presenting symptoms: congestion, facial pain and sore throat   Severity:  Moderate Duration:  1 month Timing:  Constant Progression:  Worsening Chronicity:  New Relieved by:  Nothing Worsened by:  Nothing tried Ineffective treatments: loratadine. Associated symptoms: no neck pain     Past Medical History  Diagnosis Date  . IBS (irritable bowel syndrome)   . Lupus   . Renal disorder   . Lupus nephritis     Past Surgical History  Procedure Laterality Date  . Renal biopsy      No family history on file.  History  Substance Use Topics  . Smoking status: Never Smoker   . Smokeless tobacco: Not on file  . Alcohol Use: No    OB History   Grav Para Term Preterm Abortions TAB SAB Ect Mult Living                  Review of Systems  HENT: Positive for congestion and sore throat. Negative for neck pain.   All other systems reviewed and are negative.    Allergies  Review of patient's allergies indicates no known allergies.  Home Medications   Current Outpatient Rx  Name  Route  Sig  Dispense  Refill  . lisinopril (PRINIVIL,ZESTRIL) 20 MG tablet   Oral   Take 20 mg by mouth daily.         . predniSONE (DELTASONE) 10 MG tablet   Oral   Take 10 mg by mouth daily.         . fluticasone (FLONASE) 50 MCG/ACT nasal spray   Nasal   Place 2 sprays into the nose daily.   16 g   0   .  levonorgestrel-ethinyl estradiol (SEASONALE,INTROVALE,JOLESSA) 0.15-0.03 MG tablet   Oral   Take 1 tablet by mouth daily.         Marland Kitchen loratadine (CLARITIN) 10 MG tablet   Oral   Take 10 mg by mouth daily.         Marland Kitchen omeprazole (PRILOSEC) 20 MG capsule   Oral   Take 20 mg by mouth daily.         . Simethicone (GAS-X PO)   Oral   Take 1 tablet by mouth daily as needed. For flatulence.           BP 135/76  Pulse 97  Temp(Src) 98 F (36.7 C) (Oral)  Resp 18  SpO2 98%  LMP 06/29/2012  Physical Exam  Nursing note and vitals reviewed. Constitutional: She is oriented to person, place, and time. She appears well-developed and well-nourished. No distress.  HENT:  Head: Normocephalic and atraumatic.  Mouth/Throat: Oropharynx is clear and moist.  Bilateral TM's are clear.  There is ttp over the frontal and maxillary sinuses.  Neck: Normal range of motion. Neck supple.  Cardiovascular: Normal rate and regular rhythm.  Exam reveals no gallop and no friction rub.   No murmur  heard. Pulmonary/Chest: Effort normal and breath sounds normal. No respiratory distress. She has no wheezes.  Abdominal: Soft. Bowel sounds are normal. She exhibits no distension. There is no tenderness.  Musculoskeletal: Normal range of motion.  Neurological: She is alert and oriented to person, place, and time.  Skin: Skin is warm and dry. She is not diaphoretic.    ED Course  Procedures (including critical care time)  Labs Reviewed - No data to display No results found.   No diagnosis found.    MDM  Will treat as sinusitis.          Veryl Speak, MD 07/17/12 [phone]

## 2012-07-17 NOTE — ED Notes (Signed)
Pt states she is having pain in sinus area with post-nasal drainage. Pain radiates from sinus area to throat/neck; with "throat scratchyness." Pt states she has been having congestion with mild episodes of coughing.

## 2012-12-10 ENCOUNTER — Encounter (HOSPITAL_BASED_OUTPATIENT_CLINIC_OR_DEPARTMENT_OTHER): Payer: Self-pay | Admitting: *Deleted

## 2012-12-10 ENCOUNTER — Emergency Department (HOSPITAL_BASED_OUTPATIENT_CLINIC_OR_DEPARTMENT_OTHER)
Admission: EM | Admit: 2012-12-10 | Discharge: 2012-12-10 | Disposition: A | Discharge status: Home / Self Care | Payer: 59 | Attending: Emergency Medicine | Admitting: Emergency Medicine

## 2012-12-10 DIAGNOSIS — Z8719 Personal history of other diseases of the digestive system: Secondary | ICD-10-CM | POA: Insufficient documentation

## 2012-12-10 DIAGNOSIS — IMO0002 Reserved for concepts with insufficient information to code with codable children: Secondary | ICD-10-CM | POA: Insufficient documentation

## 2012-12-10 DIAGNOSIS — Z79899 Other long term (current) drug therapy: Secondary | ICD-10-CM | POA: Insufficient documentation

## 2012-12-10 DIAGNOSIS — Z87448 Personal history of other diseases of urinary system: Secondary | ICD-10-CM | POA: Insufficient documentation

## 2012-12-10 DIAGNOSIS — Z792 Long term (current) use of antibiotics: Secondary | ICD-10-CM | POA: Insufficient documentation

## 2012-12-10 DIAGNOSIS — J069 Acute upper respiratory infection, unspecified: Secondary | ICD-10-CM | POA: Insufficient documentation

## 2012-12-10 DIAGNOSIS — Z8739 Personal history of other diseases of the musculoskeletal system and connective tissue: Secondary | ICD-10-CM | POA: Insufficient documentation

## 2012-12-10 MED ORDER — GUAIFENESIN-CODEINE 100-10 MG/5ML PO SOLN: 10.0000 mL | Freq: Three times a day (TID) | ORAL | Status: DC | PRN

## 2012-12-10 NOTE — ED Provider Notes (Signed)
CSN: [id number]     Arrival date & time 12/10/12  [id number] History   First MD Initiated Contact with Patient 12/10/12 [phone]     Chief Complaint  Patient presents with  . Cough  . Sore Throat  . Nasal Congestion   (Consider location/radiation/quality/duration/timing/severity/associated sxs/prior Treatment) Patient is a 22 y.o. female presenting with cough.  Cough Cough characteristics:  Productive Sputum characteristics:  Yellow Severity:  Moderate Onset quality:  Gradual Duration:  5 days Timing:  Constant Progression:  Worsening Chronicity:  New Context: weather changes   Context: not sick contacts   Relieved by:  Nothing Ineffective treatments: Tessalon, Flonase. Associated symptoms: rhinorrhea, sinus congestion and sore throat (Improving)   Associated symptoms: no chest pain, no fever and no shortness of breath     Past Medical History  Diagnosis Date  . IBS (irritable bowel syndrome)   . Lupus   . Renal disorder   . Lupus nephritis    Past Surgical History  Procedure Laterality Date  . Renal biopsy     No family history on file. History  Substance Use Topics  . Smoking status: Never Smoker   . Smokeless tobacco: Not on file  . Alcohol Use: No   OB History   Grav Para Term Preterm Abortions TAB SAB Ect Mult Living                 Review of Systems  Constitutional: Negative for fever.  HENT: Positive for sore throat (Improving) and rhinorrhea. Negative for congestion.   Respiratory: Positive for cough. Negative for shortness of breath.   Cardiovascular: Negative for chest pain.  Gastrointestinal: Negative for nausea, vomiting, abdominal pain and diarrhea.  All other systems reviewed and are negative.    Allergies  Review of patient's allergies indicates no known allergies.  Home Medications   Current Outpatient Rx  Name  Route  Sig  Dispense  Refill  . azithromycin (ZITHROMAX) 250 MG tablet   Oral   Take 1 tablet (250 mg total) by mouth daily. Take  first 2 tablets together, then 1 every day until finished.   6 tablet   0   . EXPIRED: fluticasone (FLONASE) 50 MCG/ACT nasal spray   Nasal   Place 2 sprays into the nose daily.   16 g   0   . levonorgestrel-ethinyl estradiol (SEASONALE,INTROVALE,JOLESSA) 0.15-0.03 MG tablet   Oral   Take 1 tablet by mouth daily.         Marland Kitchen lisinopril (PRINIVIL,ZESTRIL) 20 MG tablet   Oral   Take 20 mg by mouth daily.         Marland Kitchen loratadine (CLARITIN) 10 MG tablet   Oral   Take 10 mg by mouth daily.         Marland Kitchen omeprazole (PRILOSEC) 20 MG capsule   Oral   Take 20 mg by mouth daily.         . predniSONE (DELTASONE) 10 MG tablet   Oral   Take 10 mg by mouth daily.         . Simethicone (GAS-X PO)   Oral   Take 1 tablet by mouth daily as needed. For flatulence.          BP 121/85  Pulse 88  Temp(Src) 98.4 F (36.9 C) (Oral)  Resp 16  Ht 4\' 11"  (1.499 m)  Wt 245 lb (111.131 kg)  BMI 49.46 kg/m2  SpO2 99%  LMP 09/28/2012 Physical Exam  Nursing note and vitals reviewed. Constitutional: She  is oriented to person, place, and time. She appears well-developed and well-nourished. No distress.  HENT:  Head: Normocephalic and atraumatic.  Nose: No mucosal edema. Right sinus exhibits no maxillary sinus tenderness and no frontal sinus tenderness. Left sinus exhibits no maxillary sinus tenderness and no frontal sinus tenderness.  Mouth/Throat: Oropharynx is clear and moist.  Eyes: Conjunctivae are normal. Pupils are equal, round, and reactive to light. No scleral icterus.  Neck: Neck supple.  Cardiovascular: Normal rate, regular rhythm, normal heart sounds and intact distal pulses.   No murmur heard. Pulmonary/Chest: Effort normal and breath sounds normal. No stridor. No respiratory distress. She has no rales.  Abdominal: Soft. Bowel sounds are normal. She exhibits no distension. There is no tenderness.  Musculoskeletal: Normal range of motion. She exhibits no edema.  Neurological:  She is alert and oriented to person, place, and time.  Skin: Skin is warm and dry. No rash noted.  Psychiatric: She has a normal mood and affect. Her behavior is normal.    ED Course  Procedures (including critical care time) Labs Review Labs Reviewed - No data to display Imaging Review No results found.  MDM   1. Acute URI    22 year old well-appearing female with 5 days of cough and URI symptoms. Seen in urgent care 2 days ago and given Tessalon, which has not helped. Since that time her hoarse voice and and sore throat have near resolved.  Her cough has persisted. No fevers. Lungs clear. Very low suspicion for pneumonia. Likely URI + seasonal allergies.    Houston Siren, MD 12/10/12 [phone]

## 2012-12-10 NOTE — ED Notes (Signed)
Pt reports diagnosed with laryngitis on Saturday. Having sore throat, cough, congestion since last Wednesday.

## 2013-06-06 ENCOUNTER — Encounter (HOSPITAL_BASED_OUTPATIENT_CLINIC_OR_DEPARTMENT_OTHER): Payer: Self-pay | Admitting: Emergency Medicine

## 2013-06-06 ENCOUNTER — Emergency Department (HOSPITAL_BASED_OUTPATIENT_CLINIC_OR_DEPARTMENT_OTHER): Payer: 59

## 2013-06-06 ENCOUNTER — Emergency Department (HOSPITAL_BASED_OUTPATIENT_CLINIC_OR_DEPARTMENT_OTHER)
Admission: EM | Admit: 2013-06-06 | Discharge: 2013-06-06 | Disposition: A | Discharge status: Home / Self Care | Payer: 59 | Attending: Emergency Medicine | Admitting: Emergency Medicine

## 2013-06-06 DIAGNOSIS — M329 Systemic lupus erythematosus, unspecified: Secondary | ICD-10-CM | POA: Insufficient documentation

## 2013-06-06 DIAGNOSIS — Z79899 Other long term (current) drug therapy: Secondary | ICD-10-CM | POA: Insufficient documentation

## 2013-06-06 DIAGNOSIS — Z8742 Personal history of other diseases of the female genital tract: Secondary | ICD-10-CM | POA: Insufficient documentation

## 2013-06-06 DIAGNOSIS — N058 Unspecified nephritic syndrome with other morphologic changes: Secondary | ICD-10-CM | POA: Insufficient documentation

## 2013-06-06 DIAGNOSIS — Z792 Long term (current) use of antibiotics: Secondary | ICD-10-CM | POA: Insufficient documentation

## 2013-06-06 DIAGNOSIS — J209 Acute bronchitis, unspecified: Secondary | ICD-10-CM | POA: Insufficient documentation

## 2013-06-06 DIAGNOSIS — IMO0002 Reserved for concepts with insufficient information to code with codable children: Secondary | ICD-10-CM | POA: Insufficient documentation

## 2013-06-06 MED ORDER — ALBUTEROL SULFATE HFA 108 (90 BASE) MCG/ACT IN AERS
2.0000 | INHALATION_SPRAY | RESPIRATORY_TRACT | Status: DC | PRN
Start: 2013-06-06 — End: 2013-06-06
  Administered 2013-06-06: 2 via RESPIRATORY_TRACT
  Filled 2013-06-06: qty 6.7

## 2013-06-06 MED ORDER — HYDROCOD POLST-CHLORPHEN POLST 10-8 MG/5ML PO LQCR: 5.0000 mL | Freq: Two times a day (BID) | ORAL | Status: DC | PRN

## 2013-06-06 NOTE — ED Notes (Signed)
Cough, nasal drainage, and allergy sxs since Sunday

## 2013-06-06 NOTE — Discharge Instructions (Signed)
Acute Bronchitis Bronchitis is inflammation of the airways that extend from the windpipe into the lungs (bronchi). The inflammation often causes mucus to develop. This leads to a cough, which is the most common symptom of bronchitis.  In acute bronchitis, the condition usually develops suddenly and goes away over time, usually in a couple weeks. Smoking, allergies, and asthma can make bronchitis worse. Repeated episodes of bronchitis may cause further lung problems.  CAUSES Acute bronchitis is most often caused by the same virus that causes a cold. The virus can spread from person to person (contagious).  SIGNS AND SYMPTOMS   Cough.   Fever.   Coughing up mucus.   Body aches.   Chest congestion.   Chills.   Shortness of breath.   Sore throat.  DIAGNOSIS  Acute bronchitis is usually diagnosed through a physical exam. Tests, such as chest X-rays, are sometimes done to rule out other conditions.  TREATMENT  Acute bronchitis usually goes away in a couple weeks. Often times, no medical treatment is necessary. Medicines are sometimes given for relief of fever or cough. Antibiotics are usually not needed but may be prescribed in certain situations. In some cases, an inhaler may be recommended to help reduce shortness of breath and control the cough. A cool mist vaporizer may also be used to help thin bronchial secretions and make it easier to clear the chest.  HOME CARE INSTRUCTIONS  Get plenty of rest.   Drink enough fluids to keep your urine clear or pale yellow (unless you have a medical condition that requires fluid restriction). Increasing fluids may help thin your secretions and will prevent dehydration.   Only take over-the-counter or prescription medicines as directed by your health care provider.   Avoid smoking and secondhand smoke. Exposure to cigarette smoke or irritating chemicals will make bronchitis worse. If you are a smoker, consider using nicotine gum or skin  patches to help control withdrawal symptoms. Quitting smoking will help your lungs heal faster.   Reduce the chances of another bout of acute bronchitis by washing your hands frequently, avoiding people with cold symptoms, and trying not to touch your hands to your mouth, nose, or eyes.   Follow up with your health care provider as directed.  SEEK MEDICAL CARE IF: Your symptoms do not improve after 1 week of treatment.  SEEK IMMEDIATE MEDICAL CARE IF:  You develop an increased fever or chills.   You have chest pain.   You have severe shortness of breath.  You have bloody sputum.   You develop dehydration.  You develop fainting.  You develop repeated vomiting.  You develop a severe headache. MAKE SURE YOU:   Understand these instructions.  Will watch your condition.  Will get help right away if you are not doing well or get worse. Document Released: 03/31/2004 Document Revised: 10/24/2012 Document Reviewed: 08/14/2012 [employer] Patient Information 2014 [employer].  

## 2013-06-06 NOTE — ED Notes (Signed)
MD at bedside. 

## 2013-06-06 NOTE — ED Provider Notes (Addendum)
CSN: [id number]     Arrival date & time 06/06/13  0027 History   First MD Initiated Contact with Patient 06/06/13 0241     Chief Complaint  Patient presents with  . Cough     (Consider location/radiation/quality/duration/timing/severity/associated sxs/prior Treatment) HPI This is a 23 year old female with a four-day history of cough, sometimes severe enough to make her feel like she is going to vomit. It is occasionally productive of white or yellow sputum. She has had no fever. She feels she is not moving air as well as she should. She is also having nasal congestion which she attributes to allergies. She has been using Flonase and Zyrtec without relief of her nasal congestion. She has been taking guaifenesin with codeine without relief of her cough. She has not vomited or had diarrhea. She denies chest pain or abdominal pain.  Past Medical History  Diagnosis Date  . Lupus   . Renal disorder   . Lupus nephritis    Past Surgical History  Procedure Laterality Date  . Renal biopsy     History reviewed. No pertinent family history. History  Substance Use Topics  . Smoking status: Never Smoker   . Smokeless tobacco: Not on file  . Alcohol Use: No   OB History   Grav Para Term Preterm Abortions TAB SAB Ect Mult Living                 Review of Systems  All other systems reviewed and are negative.   Allergies  Review of patient's allergies indicates no known allergies.  Home Medications   Current Outpatient Rx  Name  Route  Sig  Dispense  Refill  . cetirizine (ZYRTEC) 10 MG tablet   Oral   Take 10 mg by mouth daily.         . fluticasone (FLONASE) 50 MCG/ACT nasal spray   Nasal   Place 2 sprays into the nose daily.   16 g   0   . guaiFENesin-codeine 100-10 MG/5ML syrup   Oral   Take 10 mLs by mouth 3 (three) times daily as needed for cough.   60 mL   0   . levonorgestrel-ethinyl estradiol (SEASONALE,INTROVALE,JOLESSA) 0.15-0.03 MG tablet   Oral   Take 1  tablet by mouth daily.         Marland Kitchen lisinopril (PRINIVIL,ZESTRIL) 20 MG tablet   Oral   Take 20 mg by mouth daily.         Marland Kitchen omeprazole (PRILOSEC) 20 MG capsule   Oral   Take 20 mg by mouth daily.         . predniSONE (DELTASONE) 10 MG tablet   Oral   Take 10 mg by mouth daily.         Marland Kitchen azithromycin (ZITHROMAX) 250 MG tablet   Oral   Take 1 tablet (250 mg total) by mouth daily. Take first 2 tablets together, then 1 every day until finished.   6 tablet   0   . loratadine (CLARITIN) 10 MG tablet   Oral   Take 10 mg by mouth daily.         . Simethicone (GAS-X PO)   Oral   Take 1 tablet by mouth daily as needed. For flatulence.          BP 143/81  Pulse 104  Temp(Src) 98.2 F (36.8 C) (Oral)  Resp 20  Ht 4\' 11"  (1.499 m)  Wt 250 lb (113.399 kg)  BMI 50.47 kg/m2  SpO2 99%  LMP 04/11/2013  Physical Exam General: Well-developed, well-nourished female in no acute distress; appearance consistent with age of record HENT: normocephalic; atraumatic; nasal congestion; no pharyngeal erythema or exudate Eyes: pupils equal, round and reactive to light; extraocular muscles intact Neck: supple Heart: regular rate and rhythm Lungs: Decreased air movement bilaterally with faint expiratory wheezing Abdomen: soft; nondistended; nontender; bowel sounds present Extremities: No deformity; full range of motion; pulses normal; trace edema of lower leg Neurologic: Awake, alert and oriented; motor function intact in all extremities and symmetric; no facial droop Skin: Warm and dry Psychiatric: Normal mood and affect   ED Course  Procedures (including critical care time)    MDM  Nursing notes and vitals signs, including pulse oximetry, reviewed.  Summary of this visit's results, reviewed by myself:  Imaging Studies: Dg Chest 2 View  06/06/2013   CLINICAL DATA:  Cough and nasal drainage.  Sore throat.  EXAM: CHEST  2 VIEW  COMPARISON:  Chest radiograph performed  09/04/2011  FINDINGS: The lungs are well-aerated. Mild vascular congestion is noted. There is no evidence of focal opacification, pleural effusion or pneumothorax.  The heart is normal in size; the mediastinal contour is within normal limits. No acute osseous abnormalities are seen.  IMPRESSION: Mild vascular congestion noted; lungs otherwise clear.   Electronically Signed   By: Garald Balding M.D.   On: 06/06/2013 00:54   2:51 AM We will treat with albuterol inhaler and switch her to Tussionex. She was advised of the mild vascular congestion noted on her chest x-ray. 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  3:08 AM Air movement improved, patient feels better after albuterol treatment.      Wynetta Fines, MD 06/06/13 [id number]  Wynetta Fines, MD 06/06/13 [place], MD 06/06/13 [id number]

## 2013-06-06 NOTE — ED Notes (Signed)
Patient transported to X-ray 

## 2013-06-15 ENCOUNTER — Encounter (HOSPITAL_BASED_OUTPATIENT_CLINIC_OR_DEPARTMENT_OTHER): Payer: Self-pay | Admitting: Emergency Medicine

## 2013-06-15 ENCOUNTER — Emergency Department (HOSPITAL_BASED_OUTPATIENT_CLINIC_OR_DEPARTMENT_OTHER)
Admission: EM | Admit: 2013-06-15 | Discharge: 2013-06-15 | Disposition: A | Discharge status: Home / Self Care | Payer: 59 | Attending: Emergency Medicine | Admitting: Emergency Medicine

## 2013-06-15 DIAGNOSIS — Z87448 Personal history of other diseases of urinary system: Secondary | ICD-10-CM | POA: Insufficient documentation

## 2013-06-15 DIAGNOSIS — T783XXA Angioneurotic edema, initial encounter: Secondary | ICD-10-CM

## 2013-06-15 DIAGNOSIS — M329 Systemic lupus erythematosus, unspecified: Secondary | ICD-10-CM | POA: Insufficient documentation

## 2013-06-15 DIAGNOSIS — Z79899 Other long term (current) drug therapy: Secondary | ICD-10-CM | POA: Insufficient documentation

## 2013-06-15 DIAGNOSIS — I10 Essential (primary) hypertension: Secondary | ICD-10-CM | POA: Insufficient documentation

## 2013-06-15 DIAGNOSIS — IMO0002 Reserved for concepts with insufficient information to code with codable children: Secondary | ICD-10-CM | POA: Insufficient documentation

## 2013-06-15 NOTE — ED Provider Notes (Signed)
CSN: [id number]     Arrival date & time 06/15/13  1131 History   First MD Initiated Contact with Patient 06/15/13 1213     Chief Complaint  Patient presents with  . Lip Swelling      (Consider location/radiation/quality/duration/timing/severity/associated sxs/prior Treatment) Patient is a 23 y.o. female presenting with allergic reaction. The history is provided by the patient. No language interpreter was used.  Allergic Reaction Presenting symptoms: swelling   Presenting symptoms: no difficulty swallowing and no rash   Severity:  Mild Context comment:  Swelling limited to upper lip starting last night. No rash, itching, trouble swallowing or SOB.   Past Medical History  Diagnosis Date  . Lupus   . Renal disorder   . Lupus nephritis    Past Surgical History  Procedure Laterality Date  . Renal biopsy     No family history on file. History  Substance Use Topics  . Smoking status: Never Smoker   . Smokeless tobacco: Not on file  . Alcohol Use: No   OB History   Grav Para Term Preterm Abortions TAB SAB Ect Mult Living                 Review of Systems  Constitutional: Negative for fever and chills.  HENT: Positive for facial swelling. Negative for sore throat and trouble swallowing.   Respiratory: Negative.  Negative for shortness of breath.   Cardiovascular: Negative.   Gastrointestinal: Negative.  Negative for nausea.  Skin: Negative.  Negative for rash.      Allergies  Review of patient's allergies indicates no known allergies.  Home Medications   Current Outpatient Rx  Name  Route  Sig  Dispense  Refill  . chlorpheniramine-HYDROcodone (TUSSIONEX PENNKINETIC ER) 10-8 MG/5ML LQCR   Oral   Take 5 mLs by mouth every 12 (twelve) hours as needed for cough.   70 mL   0   . cetirizine (ZYRTEC) 10 MG tablet   Oral   Take 10 mg by mouth daily.         Marland Kitchen EXPIRED: fluticasone (FLONASE) 50 MCG/ACT nasal spray   Nasal   Place 2 sprays into the nose daily.  16 g   0   . levonorgestrel-ethinyl estradiol (SEASONALE,INTROVALE,JOLESSA) 0.15-0.03 MG tablet   Oral   Take 1 tablet by mouth daily.         Marland Kitchen lisinopril (PRINIVIL,ZESTRIL) 20 MG tablet   Oral   Take 20 mg by mouth daily.         Marland Kitchen loratadine (CLARITIN) 10 MG tablet   Oral   Take 10 mg by mouth daily.         Marland Kitchen omeprazole (PRILOSEC) 20 MG capsule   Oral   Take 20 mg by mouth daily.         . predniSONE (DELTASONE) 10 MG tablet   Oral   Take 10 mg by mouth daily.         . Simethicone (GAS-X PO)   Oral   Take 1 tablet by mouth daily as needed. For flatulence.          BP 153/84  Pulse 125  Temp(Src) 99 F (37.2 C) (Oral)  Resp 18  SpO2 99%  LMP 04/11/2013 Physical Exam  Constitutional: She is oriented to person, place, and time. She appears well-developed and well-nourished.  HENT:  Upper lip uniformly swollen without ulceration, discoloration. No tongue or oropharyngeal swelling.   Eyes: Conjunctivae are normal.  Neck: Normal range of  motion.  Pulmonary/Chest: Effort normal. No stridor.  Neurological: She is alert and oriented to person, place, and time.  Skin: Skin is warm and dry.    ED Course  Procedures (including critical care time) Labs Review Labs Reviewed - No data to display Imaging Review No results found.   EKG Interpretation None      MDM   Final diagnoses:  None    1. Angioedema  Mild reaction of greater than 12 hours without migration or worsening. No other sxs of allergic reaction such as rash, urticaria, SOB, wheezing. Feel the symptoms related to ACE inhibitor and do not require further intervention other than cessation of ACE. Discussed care with patient. She will recheck with her nephrologist (taking Lisinopril for Lupus) Monday for medication reassessment.     Dewaine Oats, PA-C 06/15/13 1320

## 2013-06-15 NOTE — ED Notes (Signed)
Patient reports that she awoke this am with top lip swelling after eating spicy doritos last pm, felt the tingling then and took 1 benadryl at bedtime, no distress

## 2013-06-15 NOTE — Discharge Instructions (Signed)
Angioedema Angioedema is a sudden swelling of tissues, often of the skin. It can occur on the face or genitals or in the abdomen or other body parts. The swelling usually develops over a short period and gets better in 24 to 48 hours. It often begins during the night and is found when the person wakes up. The person may also get red, itchy patches of skin (hives). Angioedema can be dangerous if it involves swelling of the air passages.  Depending on the cause, episodes of angioedema may only happen once, come back in unpredictable patterns, or repeat for several years and then gradually fade away.  CAUSES  Angioedema can be caused by an allergic reaction to various triggers. It can also result from nonallergic causes, including reactions to drugs, immune system disorders, viral infections, or an abnormal gene that is passed to you from your parents (hereditary). For some people with angioedema, the cause is unknown.  Some things that can trigger angioedema include:   Foods.   Medicines, such as ACE inhibitors, ARBs, nonsteroidal anti-inflammatory agents, or estrogen.   Latex.   Animal saliva.   Insect stings.   Dyes used in X-rays.   Mild injury.   Dental work.  Surgery.  Stress.   Sudden changes in temperature.   Exercise. SIGNS AND SYMPTOMS   Swelling of the skin.  Hives. If these are present, there is also intense itching.  Redness in the affected area.   Pain in the affected area.  Swollen lips or tongue.  Breathing problems. This may happen if the air passages swell.  Wheezing. If internal organs are involved, there may be:   Nausea.   Abdominal pain.   Vomiting.   Difficulty swallowing.   Difficulty passing urine. DIAGNOSIS   Your health care provider will examine the affected area and take a medical and family history.  Various tests may be done to help determine the cause. Tests may include:  Allergy skin tests to see if the problem  is an allergic reaction.   Blood tests to check for hereditary angioedema.   Tests to check for underlying diseases that could cause the condition.   A review of your medicines, including over the counter medicines, may be done. TREATMENT  Treatment will depend on the cause of the angioedema. Possible treatments include:   Removal of anything that triggered the condition (such as stopping certain medicines).   Medicines to treat symptoms or prevent attacks. Medicines given may include:   Antihistamines.   Epinephrine injection.   Steroids.   Hospitalization may be required for severe attacks. If the air passages are affected, it can be an emergency. Tubes may need to be placed to keep the airway open. HOME CARE INSTRUCTIONS   Only take over-the-counter or prescription medicines as directed by your health care provider.  If you were given medicines for emergency allergy treatment, always carry them with you.  Wear a medical bracelet as directed by your health care provider.   Avoid known triggers. SEEK MEDICAL CARE IF:   You have repeat attacks of angioedema.   Your attacks are more frequent or more severe despite preventive measures.   You have hereditary angioedema and are considering having children. It is important to discuss the risks of passing the condition on to your children with your health care provider. SEEK IMMEDIATE MEDICAL CARE IF:   You have severe swelling of the mouth, tongue, or lips.  You have difficulty breathing.   You have difficulty swallowing.     You faint. MAKE SURE YOU:  Understand these instructions.  Will watch your condition.  Will get help right away if you are not doing well or get worse. Document Released: 05/02/2001 Document Revised: 12/12/2012 Document Reviewed: 10/15/2012 [employer] Patient Information 2014 [employer].  

## 2013-06-16 NOTE — ED Provider Notes (Signed)
Medical screening examination/treatment/procedure(s) were performed by non-physician practitioner and as supervising physician I was immediately available for consultation/collaboration.   EKG Interpretation None        Saddie Benders. Dorna Mai, MD 06/16/13 [id number]

## 2013-07-03 ENCOUNTER — Emergency Department (HOSPITAL_BASED_OUTPATIENT_CLINIC_OR_DEPARTMENT_OTHER): Payer: 59

## 2013-07-03 ENCOUNTER — Encounter (HOSPITAL_BASED_OUTPATIENT_CLINIC_OR_DEPARTMENT_OTHER): Payer: Self-pay | Admitting: Emergency Medicine

## 2013-07-03 ENCOUNTER — Emergency Department (HOSPITAL_BASED_OUTPATIENT_CLINIC_OR_DEPARTMENT_OTHER)
Admission: EM | Admit: 2013-07-03 | Discharge: 2013-07-03 | Disposition: A | Discharge status: Home / Self Care | Payer: 59 | Attending: Emergency Medicine | Admitting: Emergency Medicine

## 2013-07-03 DIAGNOSIS — Z8742 Personal history of other diseases of the female genital tract: Secondary | ICD-10-CM | POA: Insufficient documentation

## 2013-07-03 DIAGNOSIS — IMO0002 Reserved for concepts with insufficient information to code with codable children: Secondary | ICD-10-CM | POA: Insufficient documentation

## 2013-07-03 DIAGNOSIS — Z79899 Other long term (current) drug therapy: Secondary | ICD-10-CM | POA: Insufficient documentation

## 2013-07-03 DIAGNOSIS — M25569 Pain in unspecified knee: Secondary | ICD-10-CM

## 2013-07-03 DIAGNOSIS — N058 Unspecified nephritic syndrome with other morphologic changes: Secondary | ICD-10-CM | POA: Insufficient documentation

## 2013-07-03 DIAGNOSIS — M329 Systemic lupus erythematosus, unspecified: Secondary | ICD-10-CM | POA: Insufficient documentation

## 2013-07-03 LAB — BASIC METABOLIC PANEL
BUN: 12 mg/dL (ref 6–23)
CO2: 23 meq/L (ref 19–32)
Calcium: 9.1 mg/dL (ref 8.4–10.5)
Chloride: 104 mEq/L (ref 96–112)
Creatinine, Ser: 0.6 mg/dL (ref 0.50–1.10)
GFR calc Af Amer: >90 mL/min (ref >90)
GFR calc non Af Amer: >90 mL/min (ref >90)
GLUCOSE: 120 mg/dL — AB (ref 70–99)
POTASSIUM: 3.6 meq/L — AB (ref 3.7–5.3)
SODIUM: 138 meq/L (ref 137–147)

## 2013-07-03 MED ORDER — HYDROCODONE-ACETAMINOPHEN 5-325 MG PO TABS: 1.0000 | ORAL_TABLET | ORAL | Status: DC | PRN

## 2013-07-03 MED ORDER — KETOROLAC TROMETHAMINE 60 MG/2ML IM SOLN
30.0000 mg | Freq: Once | INTRAMUSCULAR | Status: AC
  Administered 2013-07-03: 30 mg via INTRAMUSCULAR
  Filled 2013-07-03: qty 2

## 2013-07-03 MED ORDER — HYDROCODONE-ACETAMINOPHEN 5-325 MG PO TABS: 1.0000 | ORAL_TABLET | Freq: Once | ORAL | Status: DC

## 2013-07-03 NOTE — ED Notes (Signed)
Pt states she is driving and needs to return to work-advised unable to give narcotics-pt agreeable-EDP notified-no new orders at this time

## 2013-07-03 NOTE — ED Provider Notes (Signed)
CSN: [id number]     Arrival date & time 07/03/13  1131 History   First MD Initiated Contact with Patient 07/03/13 1140     Chief Complaint  Patient presents with  . Knee Pain     (Consider location/radiation/quality/duration/timing/severity/associated sxs/prior Treatment) HPI  This is a 28 are old obese patient with history of lupus who presents with right knee pain. Patient reports bilateral knee pain for him through to 4 months worse on the right. She states over the last several days "it has gotten unbearable." She is taking Motrin, Aleve, and Tylenol without relief. Currently her pain is 8/10. She states that she called her neurologist about this but "I was at [place]." She denies any fevers or chills. She denies any injury.  Past Medical History  Diagnosis Date  . Lupus   . Renal disorder   . Lupus nephritis    Past Surgical History  Procedure Laterality Date  . Renal biopsy     No family history on file. History  Substance Use Topics  . Smoking status: Never Smoker   . Smokeless tobacco: Not on file  . Alcohol Use: No   OB History   Grav Para Term Preterm Abortions TAB SAB Ect Mult Living                 Review of Systems  Constitutional: Negative for fever and chills.  Musculoskeletal:       Bilateral knee pain right greater than left  All other systems reviewed and are negative.     Allergies  Lisinopril  Home Medications   Prior to Admission medications   Medication Sig Start Date End Date Taking? Authorizing Provider  cetirizine (ZYRTEC) 10 MG tablet Take 10 mg by mouth daily.    Historical Provider, MD  chlorpheniramine-HYDROcodone (TUSSIONEX PENNKINETIC ER) 10-8 MG/5ML LQCR Take 5 mLs by mouth every 12 (twelve) hours as needed for cough. 06/06/13   John L Molpus, MD  fluticasone (FLONASE) 50 MCG/ACT nasal spray Place 2 sprays into the nose daily. 09/05/11 06/06/13  April K Palumbo-Rasch, MD  levonorgestrel-ethinyl estradiol (SEASONALE,INTROVALE,JOLESSA)  0.15-0.03 MG tablet Take 1 tablet by mouth daily.    Historical Provider, MD  lisinopril (PRINIVIL,ZESTRIL) 20 MG tablet Take 20 mg by mouth daily.    Historical Provider, MD  loratadine (CLARITIN) 10 MG tablet Take 10 mg by mouth daily.    Historical Provider, MD  omeprazole (PRILOSEC) 20 MG capsule Take 20 mg by mouth daily.    Historical Provider, MD  predniSONE (DELTASONE) 10 MG tablet Take 10 mg by mouth daily.    Historical Provider, MD  Simethicone (GAS-X PO) Take 1 tablet by mouth daily as needed. For flatulence.    Historical Provider, MD   BP 134/80  Pulse 89  Temp(Src) 98.1 F (36.7 C) (Oral)  Resp 18  Ht 4\' 11"  (1.499 m)  Wt 249 lb (112.946 kg)  BMI 50.27 kg/m2  SpO2 97%  LMP 04/11/2013 Physical Exam  Nursing note and vitals reviewed. Constitutional: She is oriented to person, place, and time. No distress.  Obese  HENT:  Head: Normocephalic and atraumatic.  Eyes: Pupils are equal, round, and reactive to light.  Neck: Neck supple.  Cardiovascular: Normal rate, regular rhythm and normal heart sounds.   No murmur heard. Pulmonary/Chest: Effort normal and breath sounds normal. No respiratory distress.  Musculoskeletal:  Knee exam limited secondary to body habitus. Full range of motion of bilateral knees. No significant tenderness with range of motion. No overlying  skin changes.  No joint line tenderness. Difficult to assess effusion.  Neurological: She is alert and oriented to person, place, and time.  Skin: Skin is warm and dry.  Psychiatric: She has a normal mood and affect.    ED Course  Procedures (including critical care time) Labs Review Labs Reviewed  BASIC METABOLIC PANEL - Abnormal; Notable for the following:    Potassium 3.6 (*)    Glucose, Bld 120 (*)    All other components within normal limits    Imaging Review Dg Knee Complete 4 Views Right  07/03/2013   CLINICAL DATA:  Right knee pain for 2 months.  EXAM: RIGHT KNEE - COMPLETE 4+ VIEW  COMPARISON:   None.  FINDINGS: A small right knee joint effusion is questioned. There is no evidence of acute fracture or dislocation. Joint spaces appear preserved. No lytic or blastic osseous lesion is seen.  IMPRESSION: No acute osseous abnormality identified. Likely small knee joint effusion.   Electronically Signed   By: Logan Bores   On: 07/03/2013 13:19     EKG Interpretation None      MDM   Final diagnoses:  Knee pain    Patient presents with knee pain right greater than left worsening over the last several months. She denies any injury or fevers. Patient is morbidly obese and exam is limited secondary to body habitus. There is no signs of infection and have low suspicion at this time for infectious arthropathy. Patient has taken NSAIDs and Tylenol without relief. Ordered Norco for patient; however she is driving and has refused. Will check kidney function prior to giving Toradol. Plain films are suggestive of a small knee effusion. History physical exam is consistent with arthritis. Discuss with patient pain management and weight loss. She is to followup with her primary care physician.  After history, exam, and medical workup I feel the patient has been appropriately medically screened and is safe for discharge home. Pertinent diagnoses were discussed with the patient. Patient was given return precautions.     Merryl Hacker, MD 07/03/13 1340

## 2013-07-03 NOTE — Discharge Instructions (Signed)
Knee Pain Knee pain can be a result of an injury or other medical conditions. Treatment will depend on the cause of your pain. HOME CARE  Only take medicine as told by your doctor.  Keep a healthy weight. Being overweight can make the knee hurt more.  Stretch before exercising or playing sports.  If there is constant knee pain, change the way you exercise. Ask your doctor for advice.  Make sure shoes fit well. Choose the right shoe for the sport or activity.  Protect your knees. Wear kneepads if needed.  Rest when you are tired. GET HELP RIGHT AWAY IF:   Your knee pain does not stop.  Your knee pain does not get better.  Your knee joint feels hot to the touch.  You have a fever. MAKE SURE YOU:   Understand these instructions.  Will watch this condition.  Will get help right away if you are not doing well or get worse. Document Released: 05/20/2008 Document Revised: 05/16/2011 Document Reviewed: 05/20/2008 [facility] Patient Information 2014 [place], [place].  The patient has been given a narcotic pain medication at discharge. He/she was instructed to avoid operating heavy machinery or drive while taking this.

## 2013-07-03 NOTE — ED Notes (Signed)
C/o bilat knee pain "for months"-worse to right knee x 2 days-pt states has reports c/o to her nephrologist r/t lupus with no tx plan-pt denies any injury with pain onset

## 2014-03-16 IMAGING — CR DG CHEST 2V
2 series · 2 of 2 positions shown · non-contrast
Comparison: None.

CLINICAL DATA: Cough.

CHEST - 2 VIEW

[w chest pa]
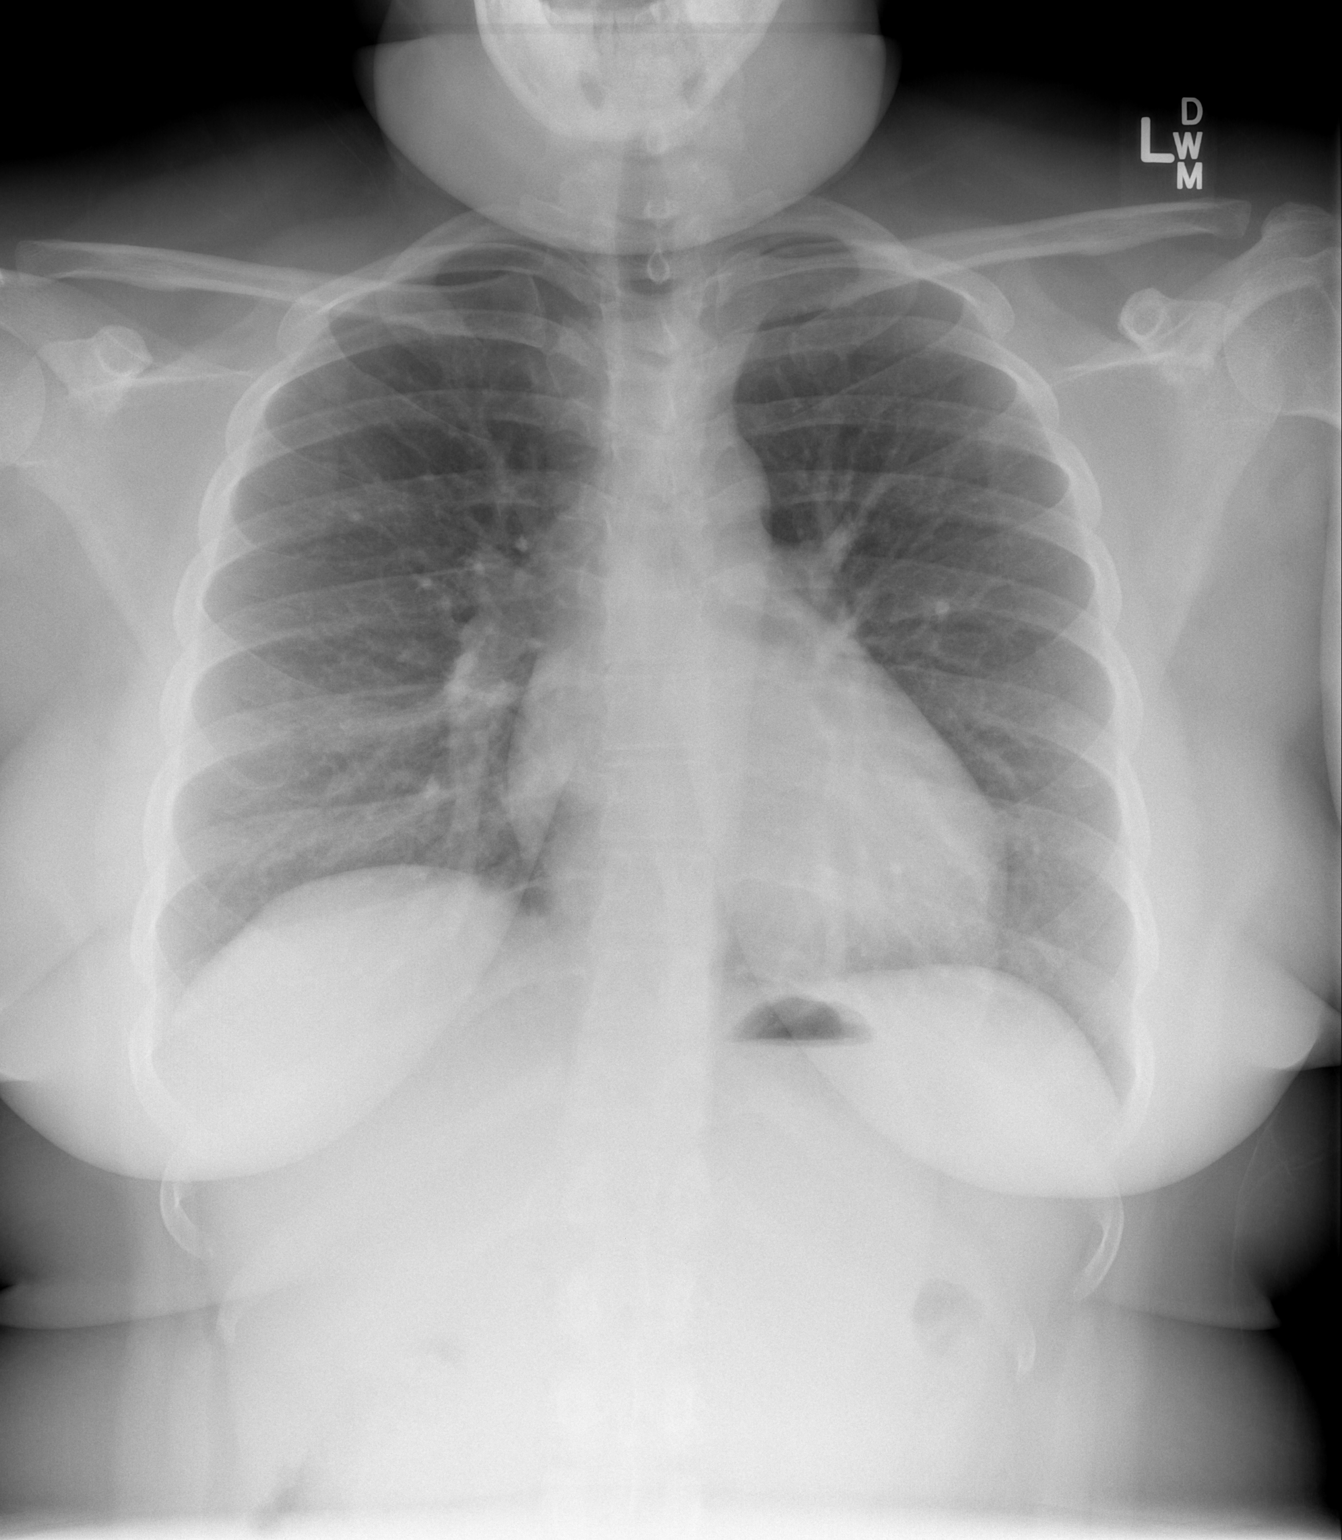

[w chest lat]
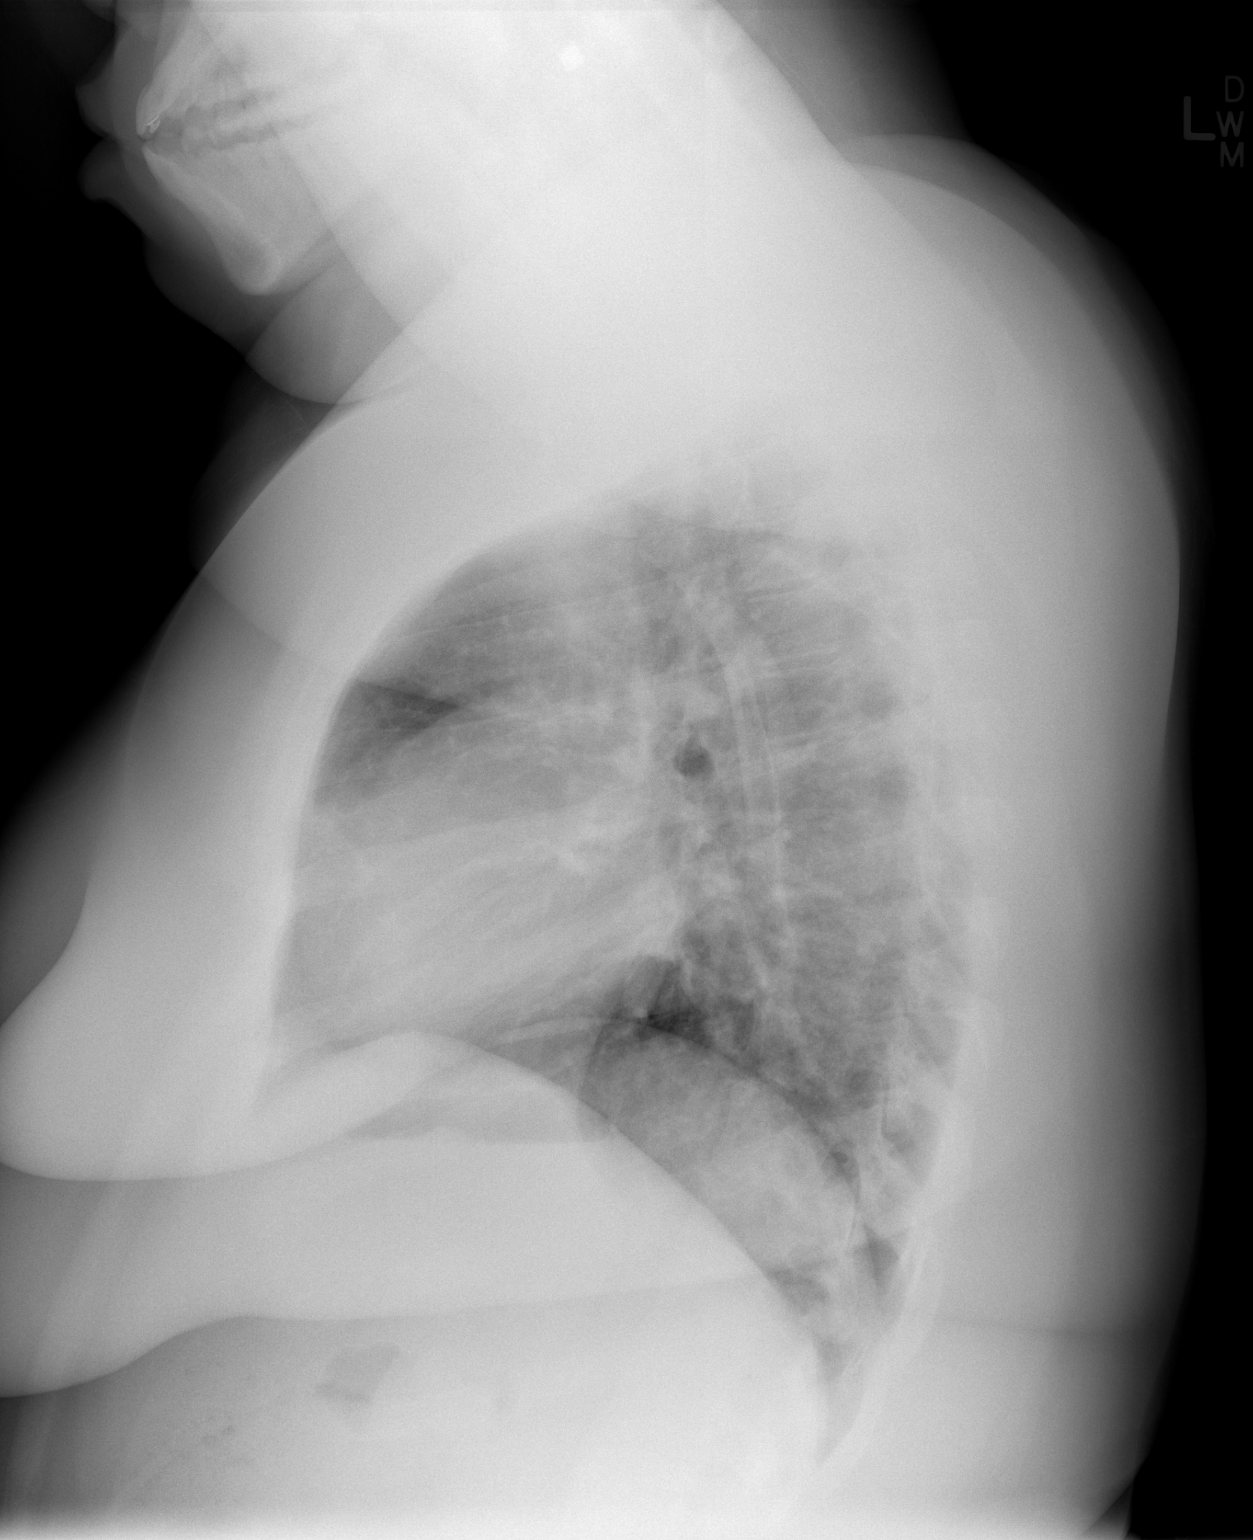

[2 of 2 positions shown; findings below may reference images not displayed]

FINDINGS: Heart and mediastinal contours are within normal limits.
No focal opacities or effusions.  No acute bony abnormality.
IMPRESSION: No active cardiopulmonary disease.

## 2015-11-16 ENCOUNTER — Emergency Department (HOSPITAL_BASED_OUTPATIENT_CLINIC_OR_DEPARTMENT_OTHER)
Admission: EM | Admit: 2015-11-16 | Discharge: 2015-11-16 | Disposition: A | Discharge status: Home / Self Care | Payer: 59 | Attending: Emergency Medicine | Admitting: Emergency Medicine

## 2015-11-16 ENCOUNTER — Encounter (HOSPITAL_BASED_OUTPATIENT_CLINIC_OR_DEPARTMENT_OTHER): Payer: Self-pay | Admitting: Adult Health

## 2015-11-16 DIAGNOSIS — M79602 Pain in left arm: Secondary | ICD-10-CM | POA: Diagnosis present

## 2015-11-16 DIAGNOSIS — M5412 Radiculopathy, cervical region: Secondary | ICD-10-CM

## 2015-11-16 DIAGNOSIS — Z79899 Other long term (current) drug therapy: Secondary | ICD-10-CM | POA: Diagnosis not present

## 2015-11-16 MED ORDER — PREDNISONE 10 MG PO TABS: 20.0000 mg | ORAL_TABLET | Freq: Two times a day (BID) | ORAL | 0 refills | Status: DC

## 2015-11-16 MED ORDER — ACETAMINOPHEN-CODEINE #3 300-30 MG PO TABS: 1.0000 | ORAL_TABLET | Freq: Four times a day (QID) | ORAL | 0 refills | Status: DC | PRN

## 2015-11-16 MED ORDER — TRAMADOL HCL 50 MG PO TABS: 50.0000 mg | ORAL_TABLET | Freq: Four times a day (QID) | ORAL | 0 refills | Status: DC | PRN

## 2015-11-16 NOTE — ED Notes (Signed)
MD at bedside. 

## 2015-11-16 NOTE — ED Triage Notes (Signed)
Presents with one year of bilateral arm pain, pt has lupus and sees a rhuematologist and was told if it got worse to call her, the pain is worse today and she is having difficulty gripping things, tried aleve and Ibuprofen with no relief. Pain is described as sharp.

## 2015-11-16 NOTE — Discharge Instructions (Addendum)
Prednisone as prescribed.  Tylenol No. 3 as prescribed as needed for pain.  Please follow-up with your primary Dr. or rheumatologist if not improving in the next week, and return to the ER if symptoms significantly worsen or change.

## 2015-11-16 NOTE — ED Provider Notes (Signed)
[facility] Provider Note   CSN: [id number] Arrival date & time: 11/16/15  1309  By signing my name below, I, Emmanuella Mensah, attest that this documentation has been prepared under the direction and in the presence of Veryl Speak, MD. Electronically Signed: Judithann Sauger, ED Scribe. 11/16/15. 3:21 PM.   History   Chief Complaint Chief Complaint  Patient presents with  . Arm Pain    HPI Comments: Anne Robinson is a 25 y.o. female with a hx of Lupus and Lupus nephritis who presents to the Emergency Department complaining of gradually worsening constant moderate bilateral arm pain (worse on left) onset 2 months ago. She notes that her pain has been intermittent for one year but has been constant for 2 months. She reports associated bilateral hand numbness and difficulty gripping things. No alleviating factors noted. Pt has tried Aleve and Ibuprofen with no significant relief. She explains that she has seen her rheumatologist in May 2017 for these symptoms when it was intermittent and was told she would receive "pneumonia shots" for relief but has not been able to. She denies that she is currently on steroids for her Lupus. She also denies any neck pain, headaches, or any visual disturbances.   The history is provided by the patient. No language interpreter was used.      Past Medical History:  Diagnosis Date  . Lupus ([place])   . Lupus nephritis ([place])   . Renal disorder     There are no active problems to display for this patient.   Past Surgical History:  Procedure Laterality Date  . RENAL BIOPSY      OB History    No data available       Home Medications    Prior to Admission medications   Medication Sig Start Date End Date Taking? Authorizing Provider  cetirizine (ZYRTEC) 10 MG tablet Take 10 mg by mouth daily.   Yes Historical Provider, MD  hydroxychloroquine (PLAQUENIL) 200 MG tablet Take 500 mg by mouth 2 (two) times daily.   Yes Historical  Provider, MD  mycophenolate (CELLCEPT) 500 MG tablet Take 500 mg by mouth 2 (two) times daily.   Yes Historical Provider, MD  chlorpheniramine-HYDROcodone (TUSSIONEX PENNKINETIC ER) 10-8 MG/5ML LQCR Take 5 mLs by mouth every 12 (twelve) hours as needed for cough. 06/06/13   John Molpus, MD  fluticasone (FLONASE) 50 MCG/ACT nasal spray Place 2 sprays into the nose daily. 09/05/11 06/06/13  April Palumbo, MD  HYDROcodone-acetaminophen (NORCO/VICODIN) 5-325 MG per tablet Take 1 tablet by mouth every 4 (four) hours as needed. 07/03/13   Merryl Hacker, MD  levonorgestrel-ethinyl estradiol (SEASONALE,INTROVALE,JOLESSA) 0.15-0.03 MG tablet Take 1 tablet by mouth daily.    Historical Provider, MD  lisinopril (PRINIVIL,ZESTRIL) 20 MG tablet Take 20 mg by mouth daily.    Historical Provider, MD  loratadine (CLARITIN) 10 MG tablet Take 10 mg by mouth daily.    Historical Provider, MD  omeprazole (PRILOSEC) 20 MG capsule Take 20 mg by mouth daily.    Historical Provider, MD  predniSONE (DELTASONE) 10 MG tablet Take 10 mg by mouth daily.    Historical Provider, MD  Simethicone (GAS-X PO) Take 1 tablet by mouth daily as needed. For flatulence.    Historical Provider, MD    Family History History reviewed. No pertinent family history.  Social History Social History  Substance Use Topics  . Smoking status: Never Smoker  . Smokeless tobacco: Not on file  . Alcohol use No  Allergies   Lisinopril   Review of Systems Review of Systems  A complete 10 system review of systems was obtained and all systems are negative except as noted in the HPI and PMH.    Physical Exam Updated Vital Signs BP 140/90   Pulse 93   Temp 98.5 F (36.9 C) (Oral)   Resp 18   Ht 4\' 11"  (1.499 m)   Wt 250 lb (113.4 kg)   LMP 10/07/2015 (Exact Date)   SpO2 98%   BMI 50.49 kg/m   Physical Exam  Constitutional: She is oriented to person, place, and time. She appears well-developed and well-nourished. No distress.    HENT:  Head: Normocephalic and atraumatic.  Eyes: EOM are normal.  Neck: Normal range of motion.  Cardiovascular: Normal rate, regular rhythm and normal heart sounds.   Pulmonary/Chest: Effort normal and breath sounds normal.  Abdominal: Soft. She exhibits no distension. There is no tenderness.  Musculoskeletal: Normal range of motion.  Left shoulder appears grossly normal.  Good ROM with the shoulder, elbow, and wrist. Strength 5/5 in BUE. She is able to flex, extend, and oppose all fingers. Sensation is intact throughout entire hand.   Neurological: She is alert and oriented to person, place, and time.  Skin: Skin is warm and dry.  Psychiatric: She has a normal mood and affect. Judgment normal.  Nursing note and vitals reviewed.    ED Treatments / Results  DIAGNOSTIC STUDIES: Oxygen Saturation is 98% on RA, normal by my interpretation.    COORDINATION OF CARE: 3:20 PM- Pt advised of plan for treatment and pt agrees. Pt will receive Prednisone and pain medication. Advised to follow up with Rheumatologist or PCP in one week if pain persists. Return precautions discussed.    Labs (all labs ordered are listed, but only abnormal results are displayed) Labs Reviewed - No data to display  EKG  EKG Interpretation None       Radiology No results found.  Procedures Procedures (including critical care time)  Medications Ordered in ED Medications - No data to display   Initial Impression / Assessment and Plan / ED Course  Veryl Speak, MD has reviewed the triage vital signs and the nursing notes.  Pertinent labs & imaging results that were available during my care of the patient were reviewed by me and considered in my medical decision making (see chart for details).  Clinical Course    Patient presents with complaints of pain in both arms that has worsened over the past month. She has a history of lupus and describes some numbness and tingling. She is neurologically  intact and appears in no distress. She will be treated with a course of prednisone for what may possibly be a cervical radiculopathy, or even possibly a flareup of her lupus. If she is not improving in the next week, she is to follow-up with her primary doctor.  Final Clinical Impressions(s) / ED Diagnoses   Final diagnoses:  None    New Prescriptions New Prescriptions   No medications on file   I personally performed the services described in this documentation, which was scribed in my presence. The recorded information has been reviewed and is accurate.        Veryl Speak, MD 11/19/15 [id number]

## 2016-05-15 ENCOUNTER — Emergency Department (HOSPITAL_BASED_OUTPATIENT_CLINIC_OR_DEPARTMENT_OTHER): Payer: 59

## 2016-05-15 ENCOUNTER — Encounter (HOSPITAL_BASED_OUTPATIENT_CLINIC_OR_DEPARTMENT_OTHER): Payer: Self-pay | Admitting: Emergency Medicine

## 2016-05-15 ENCOUNTER — Inpatient Hospital Stay (HOSPITAL_BASED_OUTPATIENT_CLINIC_OR_DEPARTMENT_OTHER)
Admission: EM | Admit: 2016-05-15 | Discharge: 2016-05-19 | DRG: 872 | Disposition: A | Discharge status: Home / Self Care | Payer: 59 | Attending: Nephrology | Admitting: Nephrology

## 2016-05-15 DIAGNOSIS — R590 Localized enlarged lymph nodes: Secondary | ICD-10-CM | POA: Diagnosis present

## 2016-05-15 DIAGNOSIS — A419 Sepsis, unspecified organism: Principal | ICD-10-CM | POA: Diagnosis present

## 2016-05-15 DIAGNOSIS — Z7951 Long term (current) use of inhaled steroids: Secondary | ICD-10-CM | POA: Diagnosis not present

## 2016-05-15 DIAGNOSIS — R7989 Other specified abnormal findings of blood chemistry: Secondary | ICD-10-CM | POA: Diagnosis present

## 2016-05-15 DIAGNOSIS — L02411 Cutaneous abscess of right axilla: Secondary | ICD-10-CM | POA: Diagnosis present

## 2016-05-15 DIAGNOSIS — R740 Nonspecific elevation of levels of transaminase and lactic acid dehydrogenase [LDH]: Secondary | ICD-10-CM | POA: Diagnosis present

## 2016-05-15 DIAGNOSIS — I959 Hypotension, unspecified: Secondary | ICD-10-CM

## 2016-05-15 DIAGNOSIS — M329 Systemic lupus erythematosus, unspecified: Secondary | ICD-10-CM | POA: Diagnosis present

## 2016-05-15 DIAGNOSIS — R748 Abnormal levels of other serum enzymes: Secondary | ICD-10-CM

## 2016-05-15 DIAGNOSIS — N179 Acute kidney failure, unspecified: Secondary | ICD-10-CM | POA: Diagnosis present

## 2016-05-15 DIAGNOSIS — Z6841 Body Mass Index (BMI) 40.0 and over, adult: Secondary | ICD-10-CM

## 2016-05-15 DIAGNOSIS — M328 Other forms of systemic lupus erythematosus: Secondary | ICD-10-CM | POA: Diagnosis not present

## 2016-05-15 DIAGNOSIS — R0602 Shortness of breath: Secondary | ICD-10-CM | POA: Diagnosis present

## 2016-05-15 DIAGNOSIS — M3215 Tubulo-interstitial nephropathy in systemic lupus erythematosus: Secondary | ICD-10-CM

## 2016-05-15 DIAGNOSIS — I248 Other forms of acute ischemic heart disease: Secondary | ICD-10-CM | POA: Diagnosis present

## 2016-05-15 DIAGNOSIS — Z888 Allergy status to other drugs, medicaments and biological substances status: Secondary | ICD-10-CM

## 2016-05-15 DIAGNOSIS — Z79899 Other long term (current) drug therapy: Secondary | ICD-10-CM | POA: Diagnosis not present

## 2016-05-15 DIAGNOSIS — I10 Essential (primary) hypertension: Secondary | ICD-10-CM | POA: Diagnosis present

## 2016-05-15 DIAGNOSIS — R000 Tachycardia, unspecified: Secondary | ICD-10-CM | POA: Diagnosis present

## 2016-05-15 DIAGNOSIS — R778 Other specified abnormalities of plasma proteins: Secondary | ICD-10-CM | POA: Diagnosis present

## 2016-05-15 HISTORY — DX: Essential (primary) hypertension: I10

## 2016-05-15 LAB — HEPATIC FUNCTION PANEL
ALBUMIN: 2.8 g/dL — AB (ref 3.5–5.0)
ALT: 470 U/L — AB (ref 14–54)
AST: 609 U/L — AB (ref 15–41)
Alkaline Phosphatase: 45 U/L (ref 38–126)
Bilirubin, Direct: 0.3 mg/dL (ref 0.1–0.5)
Indirect Bilirubin: 0.3 mg/dL (ref 0.3–0.9)
Total Bilirubin: 0.6 mg/dL (ref 0.3–1.2)
Total Protein: 8.4 g/dL — ABNORMAL HIGH (ref 6.5–8.1)

## 2016-05-15 LAB — CBC WITH DIFFERENTIAL/PLATELET
BASOS ABS: 0 10*3/uL (ref 0.0–0.1)
Basophils Relative: 0 %
EOS ABS: 0 10*3/uL (ref 0.0–0.7)
Eosinophils Relative: 0 %
HCT: 32.8 % — ABNORMAL LOW (ref 36.0–46.0)
Hemoglobin: 11.2 g/dL — ABNORMAL LOW (ref 12.0–15.0)
Lymphocytes Relative: 25 %
Lymphs Abs: 0.8 10*3/uL (ref 0.7–4.0)
MCH: 29.4 pg (ref 26.0–34.0)
MCHC: 34.1 g/dL (ref 30.0–36.0)
MCV: 86.1 fL (ref 78.0–100.0)
Monocytes Absolute: 0.1 10*3/uL (ref 0.1–1.0)
Monocytes Relative: 4 %
Neutro Abs: 2.4 10*3/uL (ref 1.7–7.7)
Neutrophils Relative %: 71 %
PLATELETS: 174 10*3/uL (ref 150–400)
RBC: 3.81 MIL/uL — AB (ref 3.87–5.11)
RDW: 14.5 % (ref 11.5–15.5)
WBC: 3.4 10*3/uL — AB (ref 4.0–10.5)

## 2016-05-15 LAB — INFLUENZA PANEL BY PCR (TYPE A & B)
INFLBPCR: NEGATIVE
Influenza A By PCR: NEGATIVE

## 2016-05-15 LAB — URINALYSIS, MICROSCOPIC (REFLEX)

## 2016-05-15 LAB — BASIC METABOLIC PANEL
Anion gap: 6 (ref 5–15)
BUN: 18 mg/dL (ref 6–20)
CO2: 19 mmol/L — ABNORMAL LOW (ref 22–32)
CREATININE: 1.33 mg/dL — AB (ref 0.44–1.00)
Calcium: 8.1 mg/dL — ABNORMAL LOW (ref 8.9–10.3)
Chloride: 103 mmol/L (ref 101–111)
GFR calc Af Amer: >60 mL/min (ref >60)
GFR, EST NON AFRICAN AMERICAN: 55 mL/min — AB (ref >60)
Glucose, Bld: 98 mg/dL (ref 65–99)
Potassium: 4.6 mmol/L (ref 3.5–5.1)
Sodium: 128 mmol/L — ABNORMAL LOW (ref 135–145)

## 2016-05-15 LAB — URINALYSIS, ROUTINE W REFLEX MICROSCOPIC
Glucose, UA: NEGATIVE mg/dL
Ketones, ur: 15 mg/dL — AB
LEUKOCYTES UA: NEGATIVE
Nitrite: NEGATIVE
SPECIFIC GRAVITY, URINE: 1.036 — AB (ref 1.005–1.030)
pH: 6 (ref 5.0–8.0)

## 2016-05-15 LAB — I-STAT CG4 LACTIC ACID, ED
LACTIC ACID, VENOUS: 1.51 mmol/L (ref 0.5–1.9)
Lactic Acid, Venous: 0.88 mmol/L (ref 0.5–1.9)

## 2016-05-15 LAB — SEDIMENTATION RATE: Sed Rate: 65 mm/hr — ABNORMAL HIGH (ref 0–22)

## 2016-05-15 LAB — TROPONIN I
TROPONIN I: 0.41 ng/mL — AB (ref <0.03)
TROPONIN I: 0.48 ng/mL — AB (ref <0.03)

## 2016-05-15 LAB — D-DIMER, QUANTITATIVE (NOT AT ARMC): D DIMER QUANT: 14.69 ug{FEU}/mL — AB (ref 0.00–0.50)

## 2016-05-15 LAB — PREGNANCY, URINE: PREG TEST UR: NEGATIVE

## 2016-05-15 MED ORDER — ONDANSETRON HCL 4 MG/2ML IJ SOLN
4.0000 mg | Freq: Four times a day (QID) | INTRAMUSCULAR | Status: DC | PRN
  Administered 2016-05-16: 4 mg via INTRAVENOUS
  Filled 2016-05-15: qty 2

## 2016-05-15 MED ORDER — SODIUM CHLORIDE 0.9 % IV BOLUS (SEPSIS)
1000.0000 mL | Freq: Once | INTRAVENOUS | Status: AC
  Administered 2016-05-15: 1000 mL via INTRAVENOUS

## 2016-05-15 MED ORDER — SODIUM CHLORIDE 0.9 % IV SOLN
1000.0000 mL | INTRAVENOUS | Status: DC
  Administered 2016-05-15: 1000 mL via INTRAVENOUS

## 2016-05-15 MED ORDER — MYCOPHENOLATE MOFETIL 250 MG PO CAPS: 500.0000 mg | ORAL_CAPSULE | Freq: Two times a day (BID) | ORAL | Status: DC

## 2016-05-15 MED ORDER — IBUPROFEN 400 MG PO TABS
400.0000 mg | ORAL_TABLET | Freq: Once | ORAL | Status: AC
  Administered 2016-05-15: 400 mg via ORAL
  Filled 2016-05-15: qty 1

## 2016-05-15 MED ORDER — VANCOMYCIN HCL IN DEXTROSE 1-5 GM/200ML-% IV SOLN
1000.0000 mg | Freq: Once | INTRAVENOUS | Status: AC
  Administered 2016-05-15: 1000 mg via INTRAVENOUS
  Filled 2016-05-15: qty 200

## 2016-05-15 MED ORDER — SODIUM CHLORIDE 0.9% FLUSH
3.0000 mL | Freq: Two times a day (BID) | INTRAVENOUS | Status: DC
  Administered 2016-05-16 – 2016-05-19 (×5): 3 mL via INTRAVENOUS

## 2016-05-15 MED ORDER — ENOXAPARIN SODIUM 60 MG/0.6ML ~~LOC~~ SOLN
60.0000 mg | SUBCUTANEOUS | Status: DC
  Administered 2016-05-16 – 2016-05-18 (×3): 60 mg via SUBCUTANEOUS
  Filled 2016-05-15 (×3): qty 0.6

## 2016-05-15 MED ORDER — PIPERACILLIN-TAZOBACTAM 3.375 G IVPB 30 MIN
3.3750 g | Freq: Once | INTRAVENOUS | Status: AC
  Administered 2016-05-15: 3.375 g via INTRAVENOUS
  Filled 2016-05-15 (×2): qty 50

## 2016-05-15 MED ORDER — IOPAMIDOL (ISOVUE-370) INJECTION 76%
100.0000 mL | Freq: Once | INTRAVENOUS | Status: AC | PRN
  Administered 2016-05-15: 100 mL via INTRAVENOUS

## 2016-05-15 MED ORDER — HYDROXYCHLOROQUINE SULFATE 200 MG PO TABS: 400.0000 mg | ORAL_TABLET | Freq: Every day | ORAL | Status: DC

## 2016-05-15 MED ORDER — VANCOMYCIN HCL IN DEXTROSE 750-5 MG/150ML-% IV SOLN
750.0000 mg | Freq: Two times a day (BID) | INTRAVENOUS | Status: DC
  Administered 2016-05-16: 750 mg via INTRAVENOUS
  Filled 2016-05-15 (×2): qty 150

## 2016-05-15 MED ORDER — PIPERACILLIN-TAZOBACTAM 3.375 G IVPB
3.3750 g | Freq: Three times a day (TID) | INTRAVENOUS | Status: DC
  Administered 2016-05-16 – 2016-05-19 (×11): 3.375 g via INTRAVENOUS
  Filled 2016-05-15 (×14): qty 50

## 2016-05-15 MED ORDER — ACETAMINOPHEN 325 MG PO TABS
650.0000 mg | ORAL_TABLET | Freq: Once | ORAL | Status: AC
  Administered 2016-05-15: 650 mg via ORAL
  Filled 2016-05-15: qty 2

## 2016-05-15 MED ORDER — SODIUM CHLORIDE 0.9 % IV SOLN: INTRAVENOUS | Status: DC

## 2016-05-15 MED ORDER — ALBUTEROL SULFATE (2.5 MG/3ML) 0.083% IN NEBU
2.5000 mg | INHALATION_SOLUTION | Freq: Once | RESPIRATORY_TRACT | Status: AC
  Administered 2016-05-15: 2.5 mg via RESPIRATORY_TRACT
  Filled 2016-05-15: qty 3

## 2016-05-15 MED ORDER — PLASMA-LYTE 148 IV SOLN
INTRAVENOUS | Status: DC
  Administered 2016-05-16: 02:00:00 via INTRAVENOUS
  Filled 2016-05-15: qty 500

## 2016-05-15 MED ORDER — LEVONORGEST-ETH ESTRAD 91-DAY 0.15-0.03 MG PO TABS
1.0000 | ORAL_TABLET | Freq: Every day | ORAL | Status: DC
  Administered 2016-05-16: 1 via ORAL

## 2016-05-15 NOTE — ED Triage Notes (Signed)
Pt c/o SHOB since yesterday, worse with exertion

## 2016-05-15 NOTE — ED Notes (Signed)
EDPA into room

## 2016-05-15 NOTE — ED Notes (Signed)
Two unsuccessful IV attempts by two separate staff members.

## 2016-05-15 NOTE — ED Notes (Signed)
ED Provider at bedside. 

## 2016-05-15 NOTE — Progress Notes (Addendum)
Sepsis, I suspect probably from arm pit abscess that just got drained on 3/8 by surgeon.  Locally looks fine but now has sepsis with fever.  No murmur on exam.  No cultures from abscess drainage it seems.  CT chest just shows reactive lymph nodes near abscess site, no PNA or lung findings.  Trop of 0.41 (presumably from sepsis)  Patient going to SDU.  Patient has gotten 2 L.  BP has been coming down, having them give the full weight based dosing (so another 2 L or something being given).  Also has h/o SLE on celcept and hydrochloroquin.  Zosyn and vanc.

## 2016-05-15 NOTE — ED Notes (Addendum)
No changes, talking on phone, sitting up in bed. Alert, NAD, calm, interactive, resps e/u, speaking in clear complete sentences, no dyspnea noted, skin W&D, VSS, BP remains low, (denies: pain, nausea, dizziness or visual changes). [facility].

## 2016-05-15 NOTE — ED Notes (Signed)
Resting/ sleeping, arousable to voice, alert, NAD, calm, interactive, resps e/u, speech clear, no dyspnea noted, skin W&D, VSS, (denies: pain, nausea, dizziness). States, "just feel tired".

## 2016-05-15 NOTE — Progress Notes (Addendum)
Pharmacy Antibiotic Note  Anne Robinson is a 26 y.o. female r/o sepsis.  Pharmacy has been consulted for vancomycin and zosyn dosing. Vancomycin 1000mg  and Zosyn 3.375gm has been ordered.  -Tmax= 102.9 -SCr= 1.33, CrCl ~ 70  Plan: -Zosyn 3.375gm IV q8h -Vancomycin 1000mg  IV x1  (for total of 2000mg  load) followed by 750mg  IV q12h   Height: 4\' 11"  (149.9 cm) Weight: 259 lb (117.5 kg) IBW/kg (Calculated) : 43.2  Temp (24hrs), Avg:101.4 F (38.6 C), Min:99.9 F (37.7 C), Max:102.9 F (39.4 C)  No results for input(s): WBC, CREATININE, LATICACIDVEN, VANCOTROUGH, VANCOPEAK, VANCORANDOM, GENTTROUGH, GENTPEAK, GENTRANDOM, TOBRATROUGH, TOBRAPEAK, TOBRARND, AMIKACINPEAK, AMIKACINTROU, AMIKACIN in the last 168 hours.  CrCl cannot be calculated (Patient's most recent lab result is older than the maximum 21 days allowed.).    Allergies  Allergen Reactions  . Lisinopril Swelling    Antimicrobials this admission: 3/11 vanc 3/11 zosyn  Dose adjustments this admission:   Microbiology results:  Thank you for allowing pharmacy to be a part of this patient's care.  Hildred Laser, Pharm D 05/15/2016 5:08 PM

## 2016-05-15 NOTE — ED Notes (Addendum)
Ambulated. SPO2 remained 100% throughout 50' walk. HR at rest 111 pre-walk, up to 130 during ambulation, HR back to 115 lying in stretcher. Pt denies pain, weakness, dizziness or nausea, reports walk was difficult d/t "just sob" (subjective) .

## 2016-05-15 NOTE — ED Provider Notes (Signed)
[facility] Provider Note   CSN: [id number] Arrival date & time: 05/15/16  1544   By signing my name below, I, Eunice Blase, attest that this documentation has been prepared under the direction and in the presence of Carmon Sails, PA-C. Electronically signed, Eunice Blase, ED Scribe. 05/15/16. 4:23 PM.   History   Chief Complaint Chief Complaint  Patient presents with  . Shortness of Breath   The history is provided by the patient and medical records. No language interpreter was used.    HPI Comments: Anne Robinson is a 26 y.o. female who presents to the Emergency Department complaining of SOB on exertion onset 05/14/2016. She states regular activities such as showering and getting dressed bring on her SOB, and rest improves her SOB over time. She notes associated nasal congestion and subjective fever. She adds she has not had any shortness of breath or chest pain like symptoms before. She reports a recent abscess I&D, and she states she was prescribed antibiotics and is on day 13 of 14.  Patient had surgical I&D three days ago, reports her abscess has gotten much better and has no pain, bleeding or discharge.  Patient states she had a yeast infection from antibiotics earlier this week which was treated and symptoms have since resolved. Otherwise, no recent new medications.  Pt on birth control pills x 5-6 years and she is a non-smoker. FMHx of multiple heart attacks and heart disease; her father had multiple heart attacks prior to the age of 28 and her uncle died ~62 years of age from heart disease. Hx of bronchitis noted, treated with an albuterol inhaler in the past.  No known lung disease including asthma or COPD.  No previous DVT/PE.  Pt denies leg swelling, chest pain, cough, N/V/D, bloody cough, sore throat, chills, recent surgery, prolonged travel, Hx of blood clots, Hx of asthma or Hx of Dm. No urinary or vaginal symptoms.   Past Medical History:  Diagnosis Date  .  Lupus   . Lupus nephritis ([place])   . Renal disorder     Patient Active Problem List   Diagnosis Date Noted  . Sepsis ([place]) 05/15/2016    Past Surgical History:  Procedure Laterality Date  . RENAL BIOPSY      OB History    No data available       Home Medications    Prior to Admission medications   Medication Sig Start Date End Date Taking? Authorizing Provider  acetaminophen-codeine (TYLENOL #3) 300-30 MG tablet Take 1-2 tablets by mouth every 6 (six) hours as needed for moderate pain. 11/16/15   Veryl Speak, MD  cetirizine (ZYRTEC) 10 MG tablet Take 10 mg by mouth daily.    Historical Provider, MD  chlorpheniramine-HYDROcodone (TUSSIONEX PENNKINETIC ER) 10-8 MG/5ML LQCR Take 5 mLs by mouth every 12 (twelve) hours as needed for cough. 06/06/13   John Molpus, MD  fluticasone (FLONASE) 50 MCG/ACT nasal spray Place 2 sprays into the nose daily. 09/05/11 06/06/13  April Palumbo, MD  HYDROcodone-acetaminophen (NORCO/VICODIN) 5-325 MG per tablet Take 1 tablet by mouth every 4 (four) hours as needed. 07/03/13   Merryl Hacker, MD  hydroxychloroquine (PLAQUENIL) 200 MG tablet Take 500 mg by mouth 2 (two) times daily.    Historical Provider, MD  levonorgestrel-ethinyl estradiol (SEASONALE,INTROVALE,JOLESSA) 0.15-0.03 MG tablet Take 1 tablet by mouth daily.    Historical Provider, MD  lisinopril (PRINIVIL,ZESTRIL) 20 MG tablet Take 20 mg by mouth daily.    Historical Provider, MD  loratadine (CLARITIN) 10 MG tablet Take 10 mg by mouth daily.    Historical Provider, MD  mycophenolate (CELLCEPT) 500 MG tablet Take 500 mg by mouth 2 (two) times daily.    Historical Provider, MD  omeprazole (PRILOSEC) 20 MG capsule Take 20 mg by mouth daily.    Historical Provider, MD  predniSONE (DELTASONE) 10 MG tablet Take 2 tablets (20 mg total) by mouth 2 (two) times daily. 11/16/15   Veryl Speak, MD  predniSONE (DELTASONE) 10 MG tablet Take 2 tablets (20 mg total) by mouth 2 (two) times daily. 11/16/15    Veryl Speak, MD  Simethicone (GAS-X PO) Take 1 tablet by mouth daily as needed. For flatulence.    Historical Provider, MD  traMADol (ULTRAM) 50 MG tablet Take 1 tablet (50 mg total) by mouth every 6 (six) hours as needed. 11/16/15   Veryl Speak, MD    Family History History reviewed. No pertinent family history.  Social History Social History  Substance Use Topics  . Smoking status: Never Smoker  . Smokeless tobacco: Never Used  . Alcohol use No     Allergies   Lisinopril   Review of Systems Review of Systems  Constitutional: Positive for fever. Negative for activity change, chills and diaphoresis.  HENT: Positive for congestion and rhinorrhea. Negative for sore throat.   Eyes: Negative for visual disturbance.  Respiratory: Positive for shortness of breath. Negative for cough and chest tightness.   Cardiovascular: Negative for chest pain, palpitations and leg swelling.  Gastrointestinal: Negative for abdominal pain, constipation, diarrhea, nausea and vomiting.  Genitourinary: Negative for dysuria.  Musculoskeletal: Negative for myalgias.  Skin: Negative for rash.  Neurological: Negative for dizziness, light-headedness and headaches.    Physical Exam Updated Vital Signs BP 105/66 (BP Location: Left Arm)   Pulse (!) 121   Temp 99.9 F (37.7 C) (Oral)   Resp (!) 28   Ht 4\' 11"  (1.499 m)   Wt 259 lb (117.5 kg)   LMP 04/08/2016   SpO2 99%   BMI 52.31 kg/m   Physical Exam  Constitutional: She is oriented to person, place, and time. She appears well-developed and well-nourished.  Non-toxic appearance.  HENT:  Head: Normocephalic and atraumatic.  Nose: Mucosal edema present.  Mouth/Throat: Uvula is midline, oropharynx is clear and moist and mucous membranes are normal. No trismus in the jaw. No uvula swelling. No oropharyngeal exudate, posterior oropharyngeal edema or posterior oropharyngeal erythema. Tonsils are 1+ on the right. Tonsils are 1+ on the left. No  tonsillar exudate.  Eyes: Conjunctivae, EOM and lids are normal. Pupils are equal, round, and reactive to light.  Neck: Trachea normal, normal range of motion and full passive range of motion without pain. Neck supple. No JVD present. No spinous process tenderness present. No neck rigidity. Normal range of motion present.  Cardiovascular: Regular rhythm, normal heart sounds and intact distal pulses.  Tachycardia present.   No murmur heard. Pulses:      Carotid pulses are 2+ on the right side, and 2+ on the left side.      Radial pulses are 2+ on the right side, and 2+ on the left side.       Dorsalis pedis pulses are 2+ on the right side, and 2+ on the left side.  No murmur, no pleuritic rub.  Pulmonary/Chest: Effort normal and breath sounds normal. Tachypnea noted. No respiratory distress. She has no decreased breath sounds. She has no wheezes. She has no rales. Chest wall is not  dull to percussion. She exhibits no tenderness.  Abdominal: Soft. Bowel sounds are normal. She exhibits no distension. There is no tenderness. There is no rebound and no guarding.  Musculoskeletal: Normal range of motion. She exhibits no edema or deformity.  No lower extremity edema  Lymphadenopathy:    She has no cervical adenopathy.  Neurological: She is alert and oriented to person, place, and time. No sensory deficit.  Skin: Skin is warm and dry. Capillary refill takes less than 2 seconds.  Psychiatric: She has a normal mood and affect. Her behavior is normal. Judgment and thought content normal.  Nursing note and vitals reviewed.    ED Treatments / Results  DIAGNOSTIC STUDIES: Oxygen Saturation is 99% on RA, normal by my interpretation.    COORDINATION OF CARE: 4:19 PM Discussed treatment plan with pt at bedside and pt agreed to plan. Will order labs, imaging, blood work and medications.  Labs (all labs ordered are listed, but only abnormal results are displayed) Labs Reviewed  TROPONIN I - Abnormal;  Notable for the following:       Result Value   Troponin I 0.41 (*)    All other components within normal limits  BASIC METABOLIC PANEL - Abnormal; Notable for the following:    Sodium 128 (*)    CO2 19 (*)    Creatinine, Ser 1.33 (*)    Calcium 8.1 (*)    GFR calc non Af Amer 55 (*)    All other components within normal limits  CBC WITH DIFFERENTIAL/PLATELET - Abnormal; Notable for the following:    WBC 3.4 (*)    RBC 3.81 (*)    Hemoglobin 11.2 (*)    HCT 32.8 (*)    All other components within normal limits  D-DIMER, QUANTITATIVE (NOT AT [facility]) - Abnormal; Notable for the following:    D-Dimer, Quant 14.69 (*)    All other components within normal limits  URINALYSIS, ROUTINE W REFLEX MICROSCOPIC - Abnormal; Notable for the following:    Color, Urine AMBER (*)    APPearance CLOUDY (*)    Specific Gravity, Urine 1.036 (*)    Hgb urine dipstick SMALL (*)    Bilirubin Urine SMALL (*)    Ketones, ur 15 (*)    Protein, ur >300 (*)    All other components within normal limits  HEPATIC FUNCTION PANEL - Abnormal; Notable for the following:    Total Protein 8.4 (*)    Albumin 2.8 (*)    AST 609 (*)    ALT 470 (*)    All other components within normal limits  URINALYSIS, MICROSCOPIC (REFLEX) - Abnormal; Notable for the following:    Bacteria, UA MANY (*)    Squamous Epithelial / LPF 6-30 (*)    All other components within normal limits  SEDIMENTATION RATE - Abnormal; Notable for the following:    Sed Rate 65 (*)    All other components within normal limits  TROPONIN I - Abnormal; Notable for the following:    Troponin I 0.48 (*)    All other components within normal limits  CULTURE, BLOOD (ROUTINE X 2)  CULTURE, BLOOD (ROUTINE X 2)  URINE CULTURE  AEROBIC CULTURE (SUPERFICIAL SPECIMEN)  PREGNANCY, URINE  INFLUENZA PANEL BY PCR (TYPE A & B)  C-REACTIVE PROTEIN  I-STAT CG4 LACTIC ACID, ED  I-STAT CG4 LACTIC ACID, ED    EKG  EKG Interpretation  Date/Time:  Sunday May 15 2016 16:06:05 EDT Ventricular Rate:  115 PR Interval:  QRS Duration: 74 QT Interval:  292 QTC Calculation: 404 R Axis:   81 Text Interpretation:  Sinus tachycardia Low voltage, precordial leads Abnormal Q suggests anterior infarct No old tracing to compare Confirmed by BELFI  MD, [place] ([id number]) on 05/15/2016 4:51:15 PM       Radiology Dg Chest 2 View  Result Date: 05/15/2016 CLINICAL DATA:  Fever, difficulty breathing for 2 days EXAM: CHEST  2 VIEW COMPARISON:  06/06/2013 FINDINGS: The heart size and mediastinal contours are within normal limits. Both lungs are clear. The visualized skeletal structures are unremarkable. IMPRESSION: No active cardiopulmonary disease. Electronically Signed   By: Kathreen Devoid   On: 05/15/2016 17:18   Ct Angio Chest Pe W/cm &/or Wo Cm  Result Date: 05/15/2016 CLINICAL DATA:  Shortness of breath since yesterday, elevated D-dimer EXAM: CT ANGIOGRAPHY CHEST WITH CONTRAST TECHNIQUE: Multidetector CT imaging of the chest was performed using the standard protocol during bolus administration of intravenous contrast. Multiplanar CT image reconstructions and MIPs were obtained to evaluate the vascular anatomy. CONTRAST:  100 mL Isovue 300 IV COMPARISON:  Chest radiographs dated 05/15/2016 FINDINGS: Cardiovascular: Satisfactory opacification of the pulmonary arteries to the lobar level. No evidence of pulmonary embolism. No evidence of thoracic aortic aneurysm or dissection. The heart is normal in size.  No pericardial effusion. Mediastinum/Nodes: Small mediastinal lymph nodes nodes which do not meet pathologic CT size criteria. Mildly prominent right perihilar nodes, also within normal limits. Enlarged bilateral axillary nodes measuring up to 2.0 cm on the left (series 7/ image 49) and 2.2 cm on the right (series 7/image 80). Triangular soft tissue along the anterior mediastinum (series 2/image 27), possibly reflecting residual thymic tissue. Visualized thyroid is  unremarkable. Lungs/Pleura: Lungs are clear. No suspicious pulmonary nodules. No focal consolidation. No pleural effusion or pneumothorax. Upper Abdomen: Visualized upper abdomen is unremarkable. Spleen is normal in size. Musculoskeletal: Visualized osseous structures are within normal limits. Review of the MIP images confirms the above findings. IMPRESSION: No evidence of pulmonary embolism. Bilateral axillary lymphadenopathy. This appearance may be reactive, related to a systemic disorder such as the patient's known lupus. However, if this patient does not have a known history of bilateral axillary lymphadenopathy, percutaneous sampling should be considered to exclude lymphoma. Electronically Signed   By: Julian Hy M.D.   On: 05/15/2016 19:00    Procedures Procedures (including critical care time)  Medications Ordered in ED Medications  albuterol (PROVENTIL) (2.5 MG/3ML) 0.083% nebulizer solution 2.5 mg (2.5 mg Nebulization Given 05/15/16 1652)  acetaminophen (TYLENOL) tablet 650 mg (650 mg Oral Given 05/15/16 1643)  piperacillin-tazobactam (ZOSYN) IVPB 3.375 g (0 g Intravenous Stopped 05/15/16 1803)  vancomycin (VANCOCIN) IVPB 1000 mg/200 mL premix (0 mg Intravenous Stopped 05/15/16 1928)  sodium chloride 0.9 % bolus 1,000 mL (0 mLs Intravenous Stopped 05/15/16 1749)  vancomycin (VANCOCIN) IVPB 1000 mg/200 mL premix (0 mg Intravenous Stopped 05/15/16 2033)  iopamidol (ISOVUE-370) 76 % injection 100 mL (100 mLs Intravenous Contrast Given 05/15/16 1817)  ibuprofen (ADVIL,MOTRIN) tablet 400 mg (400 mg Oral Given 05/15/16 1858)  sodium chloride 0.9 % bolus 1,000 mL (0 mLs Intravenous Stopped 05/15/16 2011)  sodium chloride 0.9 % bolus 1,000 mL (0 mLs Intravenous Stopped 05/15/16 2202)    And  sodium chloride 0.9 % bolus 1,000 mL (0 mLs Intravenous Stopped 05/15/16 2202)     Initial Impression / Assessment and Plan / ED Course  I have reviewed the triage vital signs and the nursing  notes.  Pertinent labs &  imaging results that were available during my care of the patient were reviewed by me and considered in my medical decision making (see chart for details).  Clinical Course as of May 16 2211  Sun May 15, 2016  1750 Sinus tachycardia Low voltage, precordial leads Abnormal Q suggests anterior infarct No old tracing to compare ED EKG [CG]  1750 FINDINGS: The heart size and mediastinal contours are within normal limits. Both lungs are clear. The visualized skeletal structures are unremarkable. DG Chest 2 View [CG]  1751 Temp: 99.9 F (37.7 C) [CG]  1751 Pulse Rate: (!) 122 [CG]  1751 BP: (!) 100/54 [CG]  1751 Resp: (!) 38 [CG]  1751 SpO2: 100 % [CG]  1751 Creatinine: (!) 1.33 [CG]  1751 AST: (!) 609 [CG]  1751 ALT: (!) 470 [CG]  1751 Alkaline Phosphatase: 45 [CG]  1751 Hemoglobin: (!) 11.2 [CG]  1751 WBC: (!) 3.4 [CG]  1751 D-Dimer, Quant: (!) 14.69 [CG]  1751 Troponin I: (!!) 0.41 [CG]  1825 Appearance: (!) CLOUDY [CG]  1825 Hgb urine dipstick: (!) SMALL [CG]  1825 Bilirubin Urine: (!) SMALL [CG]  1825 Ketones, ur: (!) 15 [CG]  1825 Protein: (!) >300 [CG]  1927 IMPRESSION: No evidence of pulmonary embolism.  Bilateral axillary lymphadenopathy. This appearance may be reactive, related to a systemic disorder such as the patient's known lupus.  However, if this patient does not have a known history of bilateral axillary lymphadenopathy, percutaneous sampling should be considered to exclude lymphoma. CT Angio Chest PE W/Cm &/Or Wo Cm [CG]  2146 Troponin I: (!!) 0.48 [CG]    Clinical Course User Index [CG] Kinnie Feil, PA-C   26 year old female with pertinent past medical history of obesity, SLE on CellCept and hydrochloroquine presents to the emergency department with exertional shortness of breath since yesterday. Patient had right axillary abscess surgically drained 3 days ago, she reports improvement, no discharge or bleeding. Patient states  she was prescribed antibiotics by her PCP 2 weeks ago and has continued to take antibiotics, is on day 13 of 14 without missing doses. On exam patient is febrile, tachycardic, tachypneic without hypoxia. Right axillary incision from abscess looks well, no tenderness, edema, discharge or surrounding cellulitis.  Regular rate rhythm, no murmur no pleuritic rub.  Lungs clear to auscultation bilaterally, abdomen is nontender.  Patient meets SIRS criteria, code sepsis activated. Patient received a total of 4 L of IV fluid in the ED, vanc and zosyn started. Chest x-ray and urinalysis did not show signs of pneumonia or urinary tract infection. No leukocytosis, white blood cells are 3.4.  Lactic acid normal.  Negative influenza.  Elevated LFTs.  Elevated creatinine at 1.33, likely AKI.  D-dimer 4.69.  No evidence of PE on CT angiogram, CT scan did show bilateral axillary lymphadenopathy likely from systemic lupus but unable to r/u lymphoma per radiology report.  Troponin 2 elevated, 0.41 and 0.48. EKG without acute ischemic changes.  Sed rate elevated at 64, CRP pending.  Fever has started to decrease. SBP remains soft around low 100s. The patient remains tachypneic around 25-35 and borderline tachycardic now around low 100s.  Blood cultures and wound cultures sent and pending.  Unclear as to source of SIRS, considering axillary abscess or myocarditis/pericarditis.  Overall patient does not appear uncomfortable, is not ill appearing. Is requesting food.  Discussed plan to transfer to [facility] with patient and husband at bedside, patient is surprised she needs admission but is agreeable.  Spoke to [facility] hospitalist Dr. Alcario Drought  who will admit.   Patient, ED treatment and discharge plan was discussed with supervising physician who also evaluated the patient and is agreeable with plan.   I personally performed the services described in this documentation, which was scribed in my presence. The recorded information has been  reviewed and is accurate.   Final Clinical Impressions(s) / ED Diagnoses   Final diagnoses:  Shortness of breath    New Prescriptions New Prescriptions   No medications on file     Arlean Hopping 05/15/16 2214    Malvin Johns, MD 05/15/16 [phone]

## 2016-05-15 NOTE — ED Notes (Signed)
One set blood cx obtained. Okay per Dr. Tamera Punt to hang antibiotics prior to obtaining 2nd set of blood cx d/t pt being a difficult stick.

## 2016-05-15 NOTE — H&P (Signed)
History and Physical    KOSISOCHUKWU GOLDBERG XNT:[id number] DOB: November 24, 1990 DOA: 05/15/2016  PCP: Hayden Rasmussen., MD  Patient coming from: Home  I have personally briefly reviewed patient's old medical records in [place]  Chief Complaint: SOB, fevers  HPI: Anne Robinson is a 26 y.o. female with medical history significant of SLE who presents to the ED with c/o DOE onset 05/14/16.  Associated nasal congestion and subjective fever (102.9 objectively in ED).  Developed new onset Nausea during my evaluation of the patient after arriving to [facility].  She has history significant for a recent I+D of a R armpit abscess, is on ABx day 13 out of 14.  Initially was insufficiently drained with just needle stick so had to be drained by surgeon 3 days ago.  Denies chest pain or abdominal pain.  Does have new onset nausea.  No cough.   ED Course: Tm 102.9, patient appears septic with initial HR 127, tachypnea to 38, BP 86 systolic initially.  All of this improves after IVF resuscitation.  She has now received 3.5L NS bolus by the time I am evaluating her (remainder of 4th L going in).  Influenza PCR negative, BCx pending, CTA chest shows bilateral axilary lymphadenopathy, probably due to SLE but cant entirely exclude lymphoma if this is new onset.   Review of Systems: As per HPI otherwise 10 point review of systems negative.   Past Medical History:  Diagnosis Date  . Lupus   . Lupus nephritis ([place])   . Renal disorder     Past Surgical History:  Procedure Laterality Date  . RENAL BIOPSY       reports that she has never smoked. She has never used smokeless tobacco. She reports that she does not drink alcohol or use drugs.  Allergies  Allergen Reactions  . Lisinopril Swelling    History reviewed. No pertinent family history. No recent illness in family nor sick contacts per patient.  Prior to Admission medications   Medication Sig Start Date End Date Taking? Authorizing Provider    cetirizine (ZYRTEC) 10 MG tablet Take 10 mg by mouth daily.    Historical Provider, MD  fluticasone (FLONASE) 50 MCG/ACT nasal spray Place 2 sprays into the nose daily. 09/05/11 06/06/13  April Palumbo, MD  hydroxychloroquine (PLAQUENIL) 200 MG tablet Take 500 mg by mouth 2 (two) times daily.    Historical Provider, MD  levonorgestrel-ethinyl estradiol (SEASONALE,INTROVALE,JOLESSA) 0.15-0.03 MG tablet Take 1 tablet by mouth daily.    Historical Provider, MD  lisinopril (PRINIVIL,ZESTRIL) 20 MG tablet Take 20 mg by mouth daily.    Historical Provider, MD  loratadine (CLARITIN) 10 MG tablet Take 10 mg by mouth daily.    Historical Provider, MD  mycophenolate (CELLCEPT) 500 MG tablet Take 500 mg by mouth 2 (two) times daily.    Historical Provider, MD  Simethicone (GAS-X PO) Take 1 tablet by mouth daily as needed. For flatulence.    Historical Provider, MD  traMADol (ULTRAM) 50 MG tablet Take 1 tablet (50 mg total) by mouth every 6 (six) hours as needed. 11/16/15   Veryl Speak, MD    Physical Exam: Vitals:   05/15/16 2154 05/15/16 2155 05/15/16 2201 05/15/16 2300  BP: (!) 86/49 (!) 86/49 132/99 98/76  Pulse: 102 105 101 99  Resp: 22 (!) 29 (!) 33 (!) 26  Temp:    98.4 F (36.9 C)  TempSrc:    Oral  SpO2: 98% 100% 100% 95%  Weight:  Height:        Constitutional: NAD, calm, comfortable Eyes: PERRL, lids and conjunctivae normal ENMT: Mucous membranes are moist. Posterior pharynx clear of any exudate or lesions.Normal dentition.  Neck: normal, supple, no masses, no thyromegaly Respiratory: clear to auscultation bilaterally, no wheezing, no crackles. Normal respiratory effort. No accessory muscle use.  Cardiovascular: Mildly tachycardic, regular. Abdomen: no tenderness, no masses palpated. No hepatosplenomegaly. Bowel sounds positive.  Musculoskeletal: no clubbing / cyanosis. No joint deformity upper and lower extremities. Good ROM, no contractures. Normal muscle tone.  Skin: Healing I+D  to R axilla Neurologic: CN 2-12 grossly intact. Sensation intact, DTR normal. Strength 5/5 in all 4.  Psychiatric: Normal judgment and insight. Alert and oriented x 3. Normal mood.    Labs on Admission: I have personally reviewed following labs and imaging studies  CBC:  Recent Labs Lab 05/15/16 1644  WBC 3.4*  NEUTROABS 2.4  HGB 11.2*  HCT 32.8*  MCV 86.1  PLT 277   Basic Metabolic Panel:  Recent Labs Lab 05/15/16 1644  NA 128*  K 4.6  CL 103  CO2 19*  GLUCOSE 98  BUN 18  CREATININE 1.33*  CALCIUM 8.1*   GFR: Estimated Creatinine Clearance: 73.8 mL/min (by C-G formula based on SCr of 1.33 mg/dL (H)). Liver Function Tests:  Recent Labs Lab 05/15/16 1644  AST 609*  ALT 470*  ALKPHOS 45  BILITOT 0.6  PROT 8.4*  ALBUMIN 2.8*   No results for input(s): LIPASE, AMYLASE in the last 168 hours. No results for input(s): AMMONIA in the last 168 hours. Coagulation Profile: No results for input(s): INR, PROTIME in the last 168 hours. Cardiac Enzymes:  Recent Labs Lab 05/15/16 1644 05/15/16 2007  TROPONINI 0.41* 0.48*   BNP (last 3 results) No results for input(s): PROBNP in the last 8760 hours. HbA1C: No results for input(s): HGBA1C in the last 72 hours. CBG: No results for input(s): GLUCAP in the last 168 hours. Lipid Profile: No results for input(s): CHOL, HDL, LDLCALC, TRIG, CHOLHDL, LDLDIRECT in the last 72 hours. Thyroid Function Tests: No results for input(s): TSH, T4TOTAL, FREET4, T3FREE, THYROIDAB in the last 72 hours. Anemia Panel: No results for input(s): VITAMINB12, FOLATE, FERRITIN, TIBC, IRON, RETICCTPCT in the last 72 hours. Urine analysis:    Component Value Date/Time   COLORURINE AMBER (A) 05/15/2016 1743   APPEARANCEUR CLOUDY (A) 05/15/2016 1743   LABSPEC 1.036 (H) 05/15/2016 1743   PHURINE 6.0 05/15/2016 1743   GLUCOSEU NEGATIVE 05/15/2016 1743   HGBUR SMALL (A) 05/15/2016 1743   BILIRUBINUR SMALL (A) 05/15/2016 1743   KETONESUR  15 (A) 05/15/2016 1743   PROTEINUR >300 (A) 05/15/2016 1743   UROBILINOGEN 1.0 10/09/2011 1941   NITRITE NEGATIVE 05/15/2016 1743   LEUKOCYTESUR NEGATIVE 05/15/2016 1743    Radiological Exams on Admission: Dg Chest 2 View  Result Date: 05/15/2016 CLINICAL DATA:  Fever, difficulty breathing for 2 days EXAM: CHEST  2 VIEW COMPARISON:  06/06/2013 FINDINGS: The heart size and mediastinal contours are within normal limits. Both lungs are clear. The visualized skeletal structures are unremarkable. IMPRESSION: No active cardiopulmonary disease. Electronically Signed   By: Kathreen Devoid   On: 05/15/2016 17:18   Ct Angio Chest Pe W/cm &/or Wo Cm  Result Date: 05/15/2016 CLINICAL DATA:  Shortness of breath since yesterday, elevated D-dimer EXAM: CT ANGIOGRAPHY CHEST WITH CONTRAST TECHNIQUE: Multidetector CT imaging of the chest was performed using the standard protocol during bolus administration of intravenous contrast. Multiplanar CT image reconstructions and  MIPs were obtained to evaluate the vascular anatomy. CONTRAST:  100 mL Isovue 300 IV COMPARISON:  Chest radiographs dated 05/15/2016 FINDINGS: Cardiovascular: Satisfactory opacification of the pulmonary arteries to the lobar level. No evidence of pulmonary embolism. No evidence of thoracic aortic aneurysm or dissection. The heart is normal in size.  No pericardial effusion. Mediastinum/Nodes: Small mediastinal lymph nodes nodes which do not meet pathologic CT size criteria. Mildly prominent right perihilar nodes, also within normal limits. Enlarged bilateral axillary nodes measuring up to 2.0 cm on the left (series 7/ image 49) and 2.2 cm on the right (series 7/image 80). Triangular soft tissue along the anterior mediastinum (series 2/image 27), possibly reflecting residual thymic tissue. Visualized thyroid is unremarkable. Lungs/Pleura: Lungs are clear. No suspicious pulmonary nodules. No focal consolidation. No pleural effusion or pneumothorax. Upper  Abdomen: Visualized upper abdomen is unremarkable. Spleen is normal in size. Musculoskeletal: Visualized osseous structures are within normal limits. Review of the MIP images confirms the above findings. IMPRESSION: No evidence of pulmonary embolism. Bilateral axillary lymphadenopathy. This appearance may be reactive, related to a systemic disorder such as the patient's known lupus. However, if this patient does not have a known history of bilateral axillary lymphadenopathy, percutaneous sampling should be considered to exclude lymphoma. Electronically Signed   By: Julian Hy M.D.   On: 05/15/2016 19:00    EKG: Independently reviewed.  Assessment/Plan Principal Problem:   Sepsis ([place]) Active Problems:   Elevated troponin   HTN (hypertension)   SLE (systemic lupus erythematosus) ([place])    1. Sepsis - ? systemic spread of infection from recent R axillary abscess in setting of immunosuppression for SLE. 1. Empiric zosyn and vanc 2. 4L IVF per sepsis pathway, another 0.5 NS bolus now, and Normosol at 125 cc/hr thereafter. 3. BCx and wound Cx pending 4. If no improvement, then perhaps consider lymph node Bx to r/o lymphoma as suggested on CT scan.  Far more suspicious that these are just reactive from SLE and/or the abscess that is also Axillary. 2. Elevated troponin - 1. Serial trops ordered 2. No ischemic changes on EKG 3. No chest pain 4. ? Myocarditis? But no rub, murmur, nor effusion on CT scan 3. SLE - will hold immunosuppression in presence of sepsis 1. Keep close eye on kidney function with this 4. HTN - mildly hypotensive still, holding home BP meds  DVT prophylaxis: Lovenox Code Status: Full Family Communication: Family at bedside Disposition Plan: Home after admit Consults called: None Admission status: Admit to inpatient   [place], [place] [facility] Pager [phone]  If 7AM-7PM, please contact day team taking care of patient [email or web address] Password  [facility]  05/15/2016, 11:56 PM

## 2016-05-16 LAB — CORTISOL: Cortisol, Plasma: 22.1 ug/dL

## 2016-05-16 LAB — BASIC METABOLIC PANEL
ANION GAP: 9 (ref 5–15)
BUN: 17 mg/dL (ref 6–20)
CO2: 14 mmol/L — ABNORMAL LOW (ref 22–32)
Calcium: 7 mg/dL — ABNORMAL LOW (ref 8.9–10.3)
Chloride: 109 mmol/L (ref 101–111)
Creatinine, Ser: 1.43 mg/dL — ABNORMAL HIGH (ref 0.44–1.00)
GFR calc Af Amer: 58 mL/min — ABNORMAL LOW (ref >60)
GFR, EST NON AFRICAN AMERICAN: 50 mL/min — AB (ref >60)
Glucose, Bld: 108 mg/dL — ABNORMAL HIGH (ref 65–99)
Potassium: 4.2 mmol/L (ref 3.5–5.1)
SODIUM: 132 mmol/L — AB (ref 135–145)

## 2016-05-16 LAB — HIV ANTIBODY (ROUTINE TESTING W REFLEX): HIV Screen 4th Generation wRfx: NONREACTIVE

## 2016-05-16 LAB — CBC
HCT: 30.9 % — ABNORMAL LOW (ref 36.0–46.0)
Hemoglobin: 10 g/dL — ABNORMAL LOW (ref 12.0–15.0)
MCH: 28.1 pg (ref 26.0–34.0)
MCHC: 32.4 g/dL (ref 30.0–36.0)
MCV: 86.8 fL (ref 78.0–100.0)
PLATELETS: 175 10*3/uL (ref 150–400)
RBC: 3.56 MIL/uL — AB (ref 3.87–5.11)
RDW: 15 % (ref 11.5–15.5)
WBC: 3.4 10*3/uL — AB (ref 4.0–10.5)

## 2016-05-16 LAB — TSH: TSH: 1.383 u[IU]/mL (ref 0.350–4.500)

## 2016-05-16 LAB — C-REACTIVE PROTEIN: CRP: 9.9 mg/dL — ABNORMAL HIGH (ref <1.0)

## 2016-05-16 LAB — MRSA PCR SCREENING: MRSA by PCR: NEGATIVE

## 2016-05-16 LAB — TROPONIN I
TROPONIN I: 0.19 ng/mL — AB (ref <0.03)
TROPONIN I: 0.32 ng/mL — AB (ref <0.03)
Troponin I: 0.15 ng/mL (ref <0.03)

## 2016-05-16 MED ORDER — METOPROLOL TARTRATE 12.5 MG HALF TABLET
12.5000 mg | ORAL_TABLET | Freq: Three times a day (TID) | ORAL | Status: DC
  Administered 2016-05-16 – 2016-05-17 (×3): 12.5 mg via ORAL
  Filled 2016-05-16 (×4): qty 1

## 2016-05-16 MED ORDER — ORAL CARE MOUTH RINSE: 15.0000 mL | Freq: Two times a day (BID) | OROMUCOSAL | Status: DC

## 2016-05-16 MED ORDER — SODIUM CHLORIDE 0.9 % IV SOLN
INTRAVENOUS | Status: DC
  Administered 2016-05-16 – 2016-05-18 (×2): via INTRAVENOUS

## 2016-05-16 MED ORDER — HYDROCORTISONE NA SUCCINATE PF 100 MG IJ SOLR
50.0000 mg | Freq: Three times a day (TID) | INTRAMUSCULAR | Status: DC
  Administered 2016-05-16 – 2016-05-17 (×3): 50 mg via INTRAVENOUS
  Filled 2016-05-16 (×3): qty 2

## 2016-05-16 MED ORDER — TRAMADOL HCL 50 MG PO TABS: 50.0000 mg | ORAL_TABLET | Freq: Four times a day (QID) | ORAL | Status: DC | PRN

## 2016-05-16 MED ORDER — ACETAMINOPHEN 325 MG PO TABS: 650.0000 mg | ORAL_TABLET | Freq: Four times a day (QID) | ORAL | Status: DC | PRN

## 2016-05-16 MED ORDER — CHLORHEXIDINE GLUCONATE 0.12 % MT SOLN
15.0000 mL | Freq: Two times a day (BID) | OROMUCOSAL | Status: DC
  Filled 2016-05-16: qty 15

## 2016-05-16 MED ORDER — SODIUM CHLORIDE 0.9 % IV BOLUS (SEPSIS)
500.0000 mL | Freq: Once | INTRAVENOUS | Status: AC
  Administered 2016-05-16: 500 mL via INTRAVENOUS

## 2016-05-16 MED ORDER — SIMETHICONE 80 MG PO CHEW: 80.0000 mg | CHEWABLE_TABLET | Freq: Four times a day (QID) | ORAL | Status: DC | PRN

## 2016-05-16 NOTE — Care Management Note (Addendum)
Case Management Note  Patient Details  Name: Anne Robinson MRN: [id number] Date of Birth: 1990-06-04  Subjective/Objective:   Presents from home with  Spouse, pta indep,Sepsis, elevated trops, hx of SLE, HTN.  She has PCP, Dr. Fabian November at [facility] on [address], she has medication coverage, has no problem getting meds.  She will have transportation at dc.    NCM will cont to follow for dc needs.                 Action/Plan:   Expected Discharge Date:                  Expected Discharge Plan:  Home/Self Care  In-House Referral:     Discharge planning Services  CM Consult  Post Acute Care Choice:    Choice offered to:     DME Arranged:    DME Agency:     HH Arranged:    HH Agency:     Status of Service:  In process, will continue to follow  If discussed at Long Length of Stay Meetings, dates discussed:    Additional Comments:  Zenon Mayo, RN 05/16/2016, 8:57 AM

## 2016-05-16 NOTE — Progress Notes (Signed)
[facility] TEAM 1 - Stepdown/ICU TEAM  Anne Robinson  JKD:[id number] DOB: [birth year] DOA: 05/15/2016 PCP: Hayden Rasmussen., MD    Brief Narrative:  26 y.o. female with history of SLE who who presented to the ED with c/o DOE onset 05/14/16 associated w/ nasal congestion and fever (102.9 in ED).  She had recently undergone an I+D of a R armpit abscess and was on ABx day 13 of 14 (though the wound was not fully opened until she saw a surgeon 3 days prior to this admit).   Subjective: Patient complains primarily of "shortness of breath."  She states that when she exerts herself even just in bed she feels that she can't catch her breath.  She denies chest pain fevers chills nausea or vomiting.  Assessment & Plan:  SIRS v/s Sepsis due to R axillary abscess Stop Vanc as MRSA screen negative - cont Zosyn - follow blood cx and wound cx    B axillary LAD Noted on CT chest - ?related to SLE or recent abscess - follow clinically - may require eventual bx   Sinus tachycardia - ?acute adrenal insufficieny  Likely due to sepsis + an element of adrenal insuff - unclear if pt has been taking steroids of late or not, though she reports she has not - begin stress dose hydrocortisone and monitor for response - wean off asap - check TSH - CTangio negative for PE   Elevated troponin  Due to tachycardia and sepsis - no indication for further eval, or further troponin testing in this 25yo female pt  Recent Labs Lab 05/15/16 1644 05/15/16 2007 05/16/16 0010 05/16/16 0458 05/16/16 1100  TROPONINI 0.41* 0.48* 0.32* 0.19* 0.15*   SLE Pt tells me she is on no meds at present, though prednisone is listed on her home med list   Transaminitis  ?due to shock liver - check viral hepatitis panel - follow trend   Acute kidney injury  Keep hydrated - follow tend - avoid nephrotoxins   Hx of HTN mildly hypotensive still, holding home BP meds - start stress dose steroids as discussed above  Morbid Obesity -  Body mass index is 52.31 kg/m.    DVT prophylaxis: lovenox  Code Status: FULL CODE Family Communication: no family present at time of exam  Disposition Plan: SDU  Consultants:  None   Procedures: None  Antimicrobials:  Zosyn 3/11 > Vanc 3/11 > 3/12  Objective: Blood pressure 98/69, pulse (!) 124, temperature 99.3 F (37.4 C), temperature source Oral, resp. rate (!) 40, height 4\' 11"  (1.499 m), weight 117.5 kg (259 lb), last menstrual period 04/08/2016, SpO2 99 %.  Intake/Output Summary (Last 24 hours) at 05/16/16 1442 Last data filed at 05/16/16 1221  Gross per 24 hour  Intake          5517.08 ml  Output                0 ml  Net          5517.08 ml   Filed Weights   05/15/16 1546  Weight: 117.5 kg (259 lb)    Examination:  General: No acute respiratory distress Lungs: Clear to auscultation bilaterally without wheezes or crackles Cardiovascular: Tachycardic but regular with no appreciable murmur or gallop Abdomen: Nontender, obese, soft, bowel sounds positive, no rebound, no ascites, no appreciable mass Extremities: No significant cyanosis, clubbing, or edema bilateral lower extremities  CBC:  Recent Labs Lab 05/15/16 1644 05/16/16 0458  WBC 3.4* 3.4*  NEUTROABS  2.4  --   HGB 11.2* 10.0*  HCT 32.8* 30.9*  MCV 86.1 86.8  PLT 174 169   Basic Metabolic Panel:  Recent Labs Lab 05/15/16 1644 05/16/16 0010  NA 128* 132*  K 4.6 4.2  CL 103 109  CO2 19* 14*  GLUCOSE 98 108*  BUN 18 17  CREATININE 1.33* 1.43*  CALCIUM 8.1* 7.0*   GFR: Estimated Creatinine Clearance: 68.6 mL/min (by C-G formula based on SCr of 1.43 mg/dL (H)).  Liver Function Tests:  Recent Labs Lab 05/15/16 1644  AST 609*  ALT 470*  ALKPHOS 45  BILITOT 0.6  PROT 8.4*  ALBUMIN 2.8*    Cardiac Enzymes:  Recent Labs Lab 05/15/16 1644 05/15/16 2007 05/16/16 0010 05/16/16 0458 05/16/16 1100  TROPONINI 0.41* 0.48* 0.32* 0.19* 0.15*    Recent Results (from the past 240  hour(s))  MRSA PCR Screening     Status: None   Collection Time: 05/16/16 12:00 AM  Result Value Ref Range Status   MRSA by PCR NEGATIVE NEGATIVE Final    Comment:        The GeneXpert MRSA Assay (FDA approved for NASAL specimens only), is one component of a comprehensive MRSA colonization surveillance program. It is not intended to diagnose MRSA infection nor to guide or monitor treatment for MRSA infections.   Aerobic Culture (superficial specimen)     Status: None (Preliminary result)   Collection Time: 05/16/16 12:01 AM  Result Value Ref Range Status   Specimen Description WOUND RIGHT ARM  Final   Special Requests NONE  Final   Gram Stain   Final    RARE WBC PRESENT, PREDOMINANTLY MONONUCLEAR RARE GRAM POSITIVE COCCOBACILLUS    Culture PENDING  Incomplete   Report Status PENDING  Incomplete     Scheduled Meds: . chlorhexidine  15 mL Mouth Rinse BID  . enoxaparin (LOVENOX) injection  60 mg Subcutaneous Q24H  . levonorgestrel-ethinyl estradiol  1 tablet Oral Daily  . mouth rinse  15 mL Mouth Rinse q12n4p  . piperacillin-tazobactam (ZOSYN)  IV  3.375 g Intravenous Q8H  . sodium chloride flush  3 mL Intravenous Q12H  . vancomycin  750 mg Intravenous Q12H     LOS: 1 day   Cherene Altes, MD [facility] Office  [phone] Pager - Text Page per Shea Evans as per below:  On-Call/Text Page:      [email or web address]      password [email or web address]  If 7PM-7AM, please contact night-coverage [email or web address] Password [facility] 05/16/2016, 2:42 PM

## 2016-05-17 LAB — CBC
HEMATOCRIT: 28.8 % — AB (ref 36.0–46.0)
HEMOGLOBIN: 9.6 g/dL — AB (ref 12.0–15.0)
MCH: 28.8 pg (ref 26.0–34.0)
MCHC: 33.3 g/dL (ref 30.0–36.0)
MCV: 86.5 fL (ref 78.0–100.0)
Platelets: 193 10*3/uL (ref 150–400)
RBC: 3.33 MIL/uL — ABNORMAL LOW (ref 3.87–5.11)
RDW: 15.1 % (ref 11.5–15.5)
WBC: 2.7 10*3/uL — AB (ref 4.0–10.5)

## 2016-05-17 LAB — COMPREHENSIVE METABOLIC PANEL
ALBUMIN: 2.3 g/dL — AB (ref 3.5–5.0)
ALT: 712 U/L — ABNORMAL HIGH (ref 14–54)
AST: 700 U/L — AB (ref 15–41)
Alkaline Phosphatase: 46 U/L (ref 38–126)
Anion gap: 5 (ref 5–15)
BUN: 14 mg/dL (ref 6–20)
CHLORIDE: 108 mmol/L (ref 101–111)
CO2: 17 mmol/L — ABNORMAL LOW (ref 22–32)
Calcium: 7 mg/dL — ABNORMAL LOW (ref 8.9–10.3)
Creatinine, Ser: 0.98 mg/dL (ref 0.44–1.00)
GFR calc Af Amer: >60 mL/min (ref >60)
Glucose, Bld: 156 mg/dL — ABNORMAL HIGH (ref 65–99)
Potassium: 4.9 mmol/L (ref 3.5–5.1)
Sodium: 130 mmol/L — ABNORMAL LOW (ref 135–145)
Total Bilirubin: 0.7 mg/dL (ref 0.3–1.2)
Total Protein: 7.4 g/dL (ref 6.5–8.1)

## 2016-05-17 LAB — HEPATITIS PANEL, ACUTE
HCV Ab: 0.1 s/co ratio (ref 0.0–0.9)
HEP B C IGM: NEGATIVE
Hep A IgM: NEGATIVE
Hepatitis B Surface Ag: NEGATIVE

## 2016-05-17 LAB — URINE CULTURE

## 2016-05-17 MED ORDER — HYDROCORTISONE NA SUCCINATE PF 100 MG IJ SOLR
50.0000 mg | Freq: Two times a day (BID) | INTRAMUSCULAR | Status: DC
  Administered 2016-05-17 – 2016-05-18 (×2): 50 mg via INTRAVENOUS
  Filled 2016-05-17 (×2): qty 2

## 2016-05-17 NOTE — Progress Notes (Signed)
NURSING PROGRESS NOTE  SILVIE OBREMSKI [id number] Transfer Data: 05/17/2016 6:24 PM Attending Provider: Damita Lack, MD KCL:EXNTZGY,FVCBS L., MD Code Status: Full  TERIN DIEROLF is a 26 y.o. female patient transferred from 4E -No acute distress noted.  -No complaints of shortness of breath.  -No complaints of chest pain.   Cardiac Monitoring: Box # 22 in place. Cardiac monitor yields:normal sinus rhythm.  Blood pressure 116/69, pulse 91, temperature 97.3 F (36.3 C), resp. rate 20, height 4\' 11"  (1.499 m), weight 117.5 kg (259 lb), last menstrual period 04/08/2016, SpO2 98 %.   IV Fluids:  IV in place, occlusive dsg intact without redness, IV cath antecubital right, condition patent and no redness normal saline.   Allergies:  Lisinopril  Past Medical History:   has a past medical history of HTN (hypertension) (05/15/2016); Lupus; Lupus nephritis ([place]); and Renal disorder.  Past Surgical History:   has a past surgical history that includes Renal biopsy.  Social History:   reports that she has never smoked. She has never used smokeless tobacco. She reports that she does not drink alcohol or use drugs.  Skin: foam dressing to incision under right arm  Patient/Family orientated to room. Information packet given to patient/family. Admission inpatient armband information verified with patient/family to include name and date of birth and placed on patient arm. Side rails up x 2, fall assessment and education completed with patient/family. Patient/family able to verbalize understanding of risk associated with falls and verbalized understanding to call for assistance before getting out of bed. Call light within reach. Patient/family able to voice and demonstrate understanding of unit orientation instructions.    Will continue to evaluate and treat per MD orders.

## 2016-05-17 NOTE — Progress Notes (Signed)
Report received from [place], [place] for transfer to [phone]

## 2016-05-17 NOTE — Progress Notes (Signed)
PROGRESS NOTE    Anne Robinson  GUY:[id number] DOB: 03/16/1990 DOA: 05/15/2016 PCP: Hayden Rasmussen., MD   Brief Narrative:  26 year old female with history of systemic lupus came to the ED with complaints of shortness of breath, nasal congestion and fever of 102.9 in ED. About 2 weeks ago she was diagnosed with right armpit abscess which she was initially treated with Bactrim but failed the therapy and her abscess, negative. She then saw a general surgeon who drained it 3 days prior to her admission which he continued to be febrile therefore came to the ED.   Assessment & Plan:   Principal Problem:   Sepsis ([place]) Active Problems:   Elevated troponin   HTN (hypertension)   SLE (systemic lupus erythematosus) ([place])  Sepsis likely secondary to right axillary abscess causing bacteremia-improved -Her blood cultures are negative to date, MRSA screening is negative. -Urine culture at this time shows multiple species present but speciation and sensitivities are still pending area -Continue IV Zosyn for now and follow cultures -Encourage oral intake. Continue stress dose steroid due to borderline low blood pressure. Continue IV fluids 100 mL an hour at this time.  Right axillary abscess with lymphadenopathy -Lymphadenopathy likely due to active infection but no signs of pneumonia at this time. -Continue treatment infection with IV antibiotics. I don't see any drainable abscess today. We'll continue to monitor the site -Continue to monitor improvement of her lymph nodes by repeating scans later/maybe outpatient. If they persist kidney biopsy otherwise follow him clinically for now.  Elevated troponin -Likely demand ischemia in the setting of history of present illness -They've trended down at this time. No further workup needed at this time.  Transaminitis -Likely due to shock liver from hypotension -Viral hepatitis panel at this time is negative. We'll continue to monitor her LFTs. If  they don't trend down she may require right upper quadrant ultrasound. I will hold off on at this time as she is not having any pain and tolerating her diet.  Acute kidney injury - improved -Likely was prerenal in nature as it has resolved with IV fluid hydration -Avoid nephrotoxic drugs and continue to monitor creatinine along with her urine output  SLE -She has not been any medication for about 2 years. -She has been on stress dose steroids here as she is listed as home dose of prednisone and at the time of presentation she was hypotensive. -Her blood pressure still remains borderline low therefore I will continue it for now  Morbid obesity with BMI over 50. Advised regarding weight loss  Even though in the EMR is documented as respiratory rate being around 30 I am not sure if that's true. Upon my evaluation her respiration little around 17-20.  DVT prophylaxis: Lovenox Code Status: Full Family Communication:  Patient comprehends condition well and no family present at bedside. Disposition Plan: Transfer to telemetry as she is doing well.  Consultants:   None  Procedures:   None  Antimicrobials:   Zosyn   Subjective: Patient states she feels much better this morning and does not any shortness of breath. No acute events overnight. She is tolerating oral diet.  Objective: Vitals:   05/16/16 2207 05/17/16 0436 05/17/16 0736 05/17/16 1143  BP: 91/72 104/71 102/69 (!) 97/58  Pulse: 93 85 86 88  Resp: 18 (!) 24 18 (!) 30  Temp: 98.3 F (36.8 C) 98.3 F (36.8 C) 98 F (36.7 C) 97.6 F (36.4 C)  TempSrc: Oral Oral Oral Oral  SpO2:  91% 98% 98% 100%  Weight:      Height:        Intake/Output Summary (Last 24 hours) at 05/17/16 1246 Last data filed at 05/17/16 1140  Gross per 24 hour  Intake             2125 ml  Output              550 ml  Net             1575 ml   Filed Weights   05/15/16 1546  Weight: 117.5 kg (259 lb)    Examination:  General exam: Appears  calm and comfortable. Obese Respiratory system: Clear to auscultation. Respiratory effort normal. Cardiovascular system: S1 & S2 heard, RRR. No JVD, murmurs, rubs, gallops or clicks. No pedal edema. Gastrointestinal system: Abdomen is nondistended, soft and nontender. No organomegaly or masses felt. Normal bowel sounds heard. Central nervous system: Alert and oriented. No focal neurological deficits. Extremities: Symmetric 5 x 5 power. Skin: No rashes, lesions or ulcers Right axillary bandage noted without any drainage.  Psychiatry: Judgement and insight appear normal. Mood & affect appropriate.     Data Reviewed:   CBC:  Recent Labs Lab 05/15/16 1644 05/16/16 0458 05/17/16 0159  WBC 3.4* 3.4* 2.7*  NEUTROABS 2.4  --   --   HGB 11.2* 10.0* 9.6*  HCT 32.8* 30.9* 28.8*  MCV 86.1 86.8 86.5  PLT 174 175 017   Basic Metabolic Panel:  Recent Labs Lab 05/15/16 1644 05/16/16 0010 05/17/16 0159  NA 128* 132* 130*  K 4.6 4.2 4.9  CL 103 109 108  CO2 19* 14* 17*  GLUCOSE 98 108* 156*  BUN 18 17 14   CREATININE 1.33* 1.43* 0.98  CALCIUM 8.1* 7.0* 7.0*   GFR: Estimated Creatinine Clearance: 100.1 mL/min (by C-G formula based on SCr of 0.98 mg/dL). Liver Function Tests:  Recent Labs Lab 05/15/16 1644 05/17/16 0159  AST 609* 700*  ALT 470* 712*  ALKPHOS 45 46  BILITOT 0.6 0.7  PROT 8.4* 7.4  ALBUMIN 2.8* 2.3*   No results for input(s): LIPASE, AMYLASE in the last 168 hours. No results for input(s): AMMONIA in the last 168 hours. Coagulation Profile: No results for input(s): INR, PROTIME in the last 168 hours. Cardiac Enzymes:  Recent Labs Lab 05/15/16 1644 05/15/16 2007 05/16/16 0010 05/16/16 0458 05/16/16 1100  TROPONINI 0.41* 0.48* 0.32* 0.19* 0.15*   BNP (last 3 results) No results for input(s): PROBNP in the last 8760 hours. HbA1C: No results for input(s): HGBA1C in the last 72 hours. CBG: No results for input(s): GLUCAP in the last 168 hours. Lipid  Profile: No results for input(s): CHOL, HDL, LDLCALC, TRIG, CHOLHDL, LDLDIRECT in the last 72 hours. Thyroid Function Tests:  Recent Labs  05/16/16 1543  TSH 1.383   Anemia Panel: No results for input(s): VITAMINB12, FOLATE, FERRITIN, TIBC, IRON, RETICCTPCT in the last 72 hours. Sepsis Labs:  Recent Labs Lab 05/15/16 1703 05/15/16 2019  LATICACIDVEN 1.51 0.88    Recent Results (from the past 240 hour(s))  Blood Culture (routine x 2)     Status: None (Preliminary result)   Collection Time: 05/15/16  5:11 PM  Result Value Ref Range Status   Specimen Description   Final    BLOOD RIGHT ARM BOTTLES DRAWN AEROBIC AND ANAEROBIC   Special Requests NONE  Final   Culture   Final    NO GROWTH < 24 HOURS Performed at [facility], [address] [place], [place] [address]    Report Status PENDING  Incomplete  Urine culture     Status: Abnormal   Collection Time: 05/15/16  5:43 PM  Result Value Ref Range Status   Specimen Description URINE, RANDOM  Final   Special Requests NONE  Final   Culture MULTIPLE SPECIES PRESENT, SUGGEST RECOLLECTION (A)  Final   Report Status 05/17/2016 FINAL  Final  Blood Culture (routine x 2)     Status: None (Preliminary result)   Collection Time: 05/15/16  6:00 PM  Result Value Ref Range Status   Specimen Description BLOOD FOREARM BOTTLES DRAWN AEROBIC AND ANAEROBIC  Final   Special Requests NONE  Final   Culture   Final    NO GROWTH < 24 HOURS Performed at [facility], [place] [address], [place], [place] [address]    Report Status PENDING  Incomplete  MRSA PCR Screening     Status: None   Collection Time: 05/16/16 12:00 AM  Result Value Ref Range Status   MRSA by PCR NEGATIVE NEGATIVE Final    Comment:        The GeneXpert MRSA Assay (FDA approved for NASAL specimens only), is one component of a comprehensive MRSA colonization surveillance program. It is not intended to diagnose MRSA infection nor to guide or monitor treatment  for MRSA infections.   Aerobic Culture (superficial specimen)     Status: None (Preliminary result)   Collection Time: 05/16/16 12:01 AM  Result Value Ref Range Status   Specimen Description WOUND RIGHT ARM  Final   Special Requests NONE  Final   Gram Stain   Final    RARE WBC PRESENT, PREDOMINANTLY MONONUCLEAR RARE GRAM POSITIVE COCCOBACILLUS    Culture PENDING  Incomplete   Report Status PENDING  Incomplete         Radiology Studies: Dg Chest 2 View  Result Date: 05/15/2016 CLINICAL DATA:  Fever, difficulty breathing for 2 days EXAM: CHEST  2 VIEW COMPARISON:  06/06/2013 FINDINGS: The heart size and mediastinal contours are within normal limits. Both lungs are clear. The visualized skeletal structures are unremarkable. IMPRESSION: No active cardiopulmonary disease. Electronically Signed   By: Kathreen Devoid   On: 05/15/2016 17:18   Ct Angio Chest Pe W/cm &/or Wo Cm  Result Date: 05/15/2016 CLINICAL DATA:  Shortness of breath since yesterday, elevated D-dimer EXAM: CT ANGIOGRAPHY CHEST WITH CONTRAST TECHNIQUE: Multidetector CT imaging of the chest was performed using the standard protocol during bolus administration of intravenous contrast. Multiplanar CT image reconstructions and MIPs were obtained to evaluate the vascular anatomy. CONTRAST:  100 mL Isovue 300 IV COMPARISON:  Chest radiographs dated 05/15/2016 FINDINGS: Cardiovascular: Satisfactory opacification of the pulmonary arteries to the lobar level. No evidence of pulmonary embolism. No evidence of thoracic aortic aneurysm or dissection. The heart is normal in size.  No pericardial effusion. Mediastinum/Nodes: Small mediastinal lymph nodes nodes which do not meet pathologic CT size criteria. Mildly prominent right perihilar nodes, also within normal limits. Enlarged bilateral axillary nodes measuring up to 2.0 cm on the left (series 7/ image 49) and 2.2 cm on the right (series 7/image 80). Triangular soft tissue along the anterior  mediastinum (series 2/image 27), possibly reflecting residual thymic tissue. Visualized thyroid is unremarkable. Lungs/Pleura: Lungs are clear. No suspicious pulmonary nodules. No focal consolidation. No pleural effusion or pneumothorax. Upper Abdomen: Visualized upper abdomen is unremarkable. Spleen is normal in size. Musculoskeletal: Visualized osseous structures are within normal limits. Review of  the MIP images confirms the above findings. IMPRESSION: No evidence of pulmonary embolism. Bilateral axillary lymphadenopathy. This appearance may be reactive, related to a systemic disorder such as the patient's known lupus. However, if this patient does not have a known history of bilateral axillary lymphadenopathy, percutaneous sampling should be considered to exclude lymphoma. Electronically Signed   By: Julian Hy M.D.   On: 05/15/2016 19:00        Scheduled Meds: . enoxaparin (LOVENOX) injection  60 mg Subcutaneous Q24H  . hydrocortisone sod succinate (SOLU-CORTEF) inj  50 mg Intravenous Q8H  . metoprolol tartrate  12.5 mg Oral TID  . piperacillin-tazobactam (ZOSYN)  IV  3.375 g Intravenous Q8H  . sodium chloride flush  3 mL Intravenous Q12H   Continuous Infusions: . sodium chloride 100 mL/hr at 05/16/16 1607     LOS: 2 days    Time spent: 35 mins     Paulla Mcclaskey Arsenio Loader, MD [facility] Pager [phone]   If 7PM-7AM, please contact night-coverage [email or web address] Password [facility] 05/17/2016, 12:46 PM

## 2016-05-18 DIAGNOSIS — M328 Other forms of systemic lupus erythematosus: Secondary | ICD-10-CM

## 2016-05-18 LAB — COMPREHENSIVE METABOLIC PANEL
ALT: 381 U/L — ABNORMAL HIGH (ref 14–54)
ANION GAP: 5 (ref 5–15)
AST: 188 U/L — AB (ref 15–41)
Albumin: 2.1 g/dL — ABNORMAL LOW (ref 3.5–5.0)
Alkaline Phosphatase: 41 U/L (ref 38–126)
BILIRUBIN TOTAL: 0.5 mg/dL (ref 0.3–1.2)
BUN: 10 mg/dL (ref 6–20)
CO2: 22 mmol/L (ref 22–32)
Calcium: 7.4 mg/dL — ABNORMAL LOW (ref 8.9–10.3)
Chloride: 109 mmol/L (ref 101–111)
Creatinine, Ser: 0.85 mg/dL (ref 0.44–1.00)
GFR calc Af Amer: >60 mL/min (ref >60)
Glucose, Bld: 178 mg/dL — ABNORMAL HIGH (ref 65–99)
POTASSIUM: 4.3 mmol/L (ref 3.5–5.1)
Sodium: 136 mmol/L (ref 135–145)
TOTAL PROTEIN: 6.6 g/dL (ref 6.5–8.1)

## 2016-05-18 LAB — AEROBIC CULTURE W GRAM STAIN (SUPERFICIAL SPECIMEN)

## 2016-05-18 LAB — CBC
HEMATOCRIT: 30.4 % — AB (ref 36.0–46.0)
Hemoglobin: 9.9 g/dL — ABNORMAL LOW (ref 12.0–15.0)
MCH: 28.4 pg (ref 26.0–34.0)
MCHC: 32.6 g/dL (ref 30.0–36.0)
MCV: 87.4 fL (ref 78.0–100.0)
Platelets: 258 10*3/uL (ref 150–400)
RBC: 3.48 MIL/uL — ABNORMAL LOW (ref 3.87–5.11)
RDW: 15.7 % — AB (ref 11.5–15.5)
WBC: 6.5 10*3/uL (ref 4.0–10.5)

## 2016-05-18 LAB — HEMOGLOBIN A1C
Hgb A1c MFr Bld: 5.7 % — ABNORMAL HIGH (ref 4.8–5.6)
MEAN PLASMA GLUCOSE: 117 mg/dL

## 2016-05-18 LAB — PROTIME-INR
INR: 1.21
PROTHROMBIN TIME: 15.4 s — AB (ref 11.4–15.2)

## 2016-05-18 LAB — AEROBIC CULTURE  (SUPERFICIAL SPECIMEN): CULTURE: NORMAL

## 2016-05-18 MED ORDER — METOPROLOL TARTRATE 12.5 MG HALF TABLET: 12.5000 mg | ORAL_TABLET | Freq: Two times a day (BID) | ORAL | Status: DC

## 2016-05-18 NOTE — Progress Notes (Signed)
PROGRESS NOTE    Anne Robinson  GOT:[id number] DOB: September 16, 1990 DOA: 05/15/2016 PCP: Hayden Rasmussen., MD   Brief Narrative: 26 year old female with history of systemic lupus came to the ED with complaints of shortness of breath, nasal congestion and fever of 102.9 in ED. About 2 weeks prior to admission,  she was diagnosed with right armpit abscess which she was initially treated with Bactrim but failed the therapy. She then saw a general surgeon who drained it 3 days prior to her admission after which she continued to be febrile therefore came to the ED.  Assessment & Plan:  # Presumed Sepsis likely secondary to right axillary abscess:  -Blood cultures are negative so far, continue Zosyn for now. May be able to switch to oral Augmentin by tomorrow. -Recommended to follow up with surgeon after discharge. -Bilateral axillary lymphadenopathy likely reactive. If does not improve then recommended outpatient follow-up and possible biopsy.  # Elevated troponin likely demand ischemia. Patient has no chest pain.   # transaminitis likely in the setting of hypotension. Liver enzymes are trending down today. Hepatitis panel negative. HIV negative. Discontinue Tylenol prn order.  #Acute kidney injury likely prerenal. Serum creatinine level improved. DC IVF  #SLE: Patient reported that she is in remission and not on medications. Patient was treated with  stress dose hydrocortisone. Blood pressure improved. I'll discontinue hydrocortisone. Recommended to follow-up with her rheumatologist outpatient to discuss if patient needs further evaluation.  #Morbid obesity: Recommended healthy diet and outpatient follow-up.  # Hypotension of unknown etiology: Blood pressure is improved. Discontinue metoprolol. Holding IV fluid. Encourage oral intake.  Principal Problem:   Sepsis ([place]) Active Problems:   Elevated troponin   HTN (hypertension)   SLE (systemic lupus erythematosus) ([facility])   DVT prophylaxis:  Lovenox subcutaneous Code Status: Full code Family Communication: No family present at bedside  Disposition Plan: Likely discharge home in 1-2 days  Consultants:   None  Procedures: None Antimicrobials: IV Zosyn since 3/12 Subjective: Patient was seen and examined at bedside. Denied fever, chills, nausea, vomiting, chest pain, shortness of breath, and headache or dizziness.  Objective: Vitals:   05/17/16 1818 05/18/16 0654 05/18/16 1003 05/18/16 1445  BP: 116/69 103/71 (!) 96/58 109/79  Pulse: 91 80 87 86  Resp: 20 17  20   Temp: 97.3 F (36.3 C) 97.6 F (36.4 C)  97.4 F (36.3 C)  TempSrc:  Oral  Oral  SpO2: 98% 100%  100%  Weight:      Height:        Intake/Output Summary (Last 24 hours) at 05/18/16 1505 Last data filed at 05/18/16 0118  Gross per 24 hour  Intake          1113.33 ml  Output                0 ml  Net          1113.33 ml   Filed Weights   05/15/16 1546  Weight: 117.5 kg (259 lb)    Examination:  General exam: Appears calm and comfortable  Respiratory system: Clear to auscultation. Respiratory effort normal. No wheezing or crackle Cardiovascular system: S1 & S2 heard, RRR.  No pedal edema. Gastrointestinal system: Abdomen is nondistended, soft and nontender. Normal bowel sounds heard. Central nervous system: Alert and oriented. No focal neurological deficits. Extremities: Symmetric 5 x 5 power. Skin: Right axillary lesion has bandage without any drainage. Psychiatry: Judgement and insight appear normal. Mood & affect appropriate.     Data  Reviewed: I have personally reviewed following labs and imaging studies  CBC:  Recent Labs Lab 05/15/16 1644 05/16/16 0458 05/17/16 0159 05/18/16 1229  WBC 3.4* 3.4* 2.7* 6.5  NEUTROABS 2.4  --   --   --   HGB 11.2* 10.0* 9.6* 9.9*  HCT 32.8* 30.9* 28.8* 30.4*  MCV 86.1 86.8 86.5 87.4  PLT 174 175 193 573   Basic Metabolic Panel:  Recent Labs Lab 05/15/16 1644 05/16/16 0010 05/17/16 0159  05/18/16 1229  NA 128* 132* 130* 136  K 4.6 4.2 4.9 4.3  CL 103 109 108 109  CO2 19* 14* 17* 22  GLUCOSE 98 108* 156* 178*  BUN 18 17 14 10   CREATININE 1.33* 1.43* 0.98 0.85  CALCIUM 8.1* 7.0* 7.0* 7.4*   GFR: Estimated Creatinine Clearance: 115.4 mL/min (by C-G formula based on SCr of 0.85 mg/dL). Liver Function Tests:  Recent Labs Lab 05/15/16 1644 05/17/16 0159 05/18/16 1229  AST 609* 700* 188*  ALT 470* 712* 381*  ALKPHOS 45 46 41  BILITOT 0.6 0.7 0.5  PROT 8.4* 7.4 6.6  ALBUMIN 2.8* 2.3* 2.1*   No results for input(s): LIPASE, AMYLASE in the last 168 hours. No results for input(s): AMMONIA in the last 168 hours. Coagulation Profile:  Recent Labs Lab 05/18/16 1229  INR 1.21   Cardiac Enzymes:  Recent Labs Lab 05/15/16 1644 05/15/16 2007 05/16/16 0010 05/16/16 0458 05/16/16 1100  TROPONINI 0.41* 0.48* 0.32* 0.19* 0.15*   BNP (last 3 results) No results for input(s): PROBNP in the last 8760 hours. HbA1C:  Recent Labs  05/17/16 0203  HGBA1C 5.7*   CBG: No results for input(s): GLUCAP in the last 168 hours. Lipid Profile: No results for input(s): CHOL, HDL, LDLCALC, TRIG, CHOLHDL, LDLDIRECT in the last 72 hours. Thyroid Function Tests:  Recent Labs  05/16/16 1543  TSH 1.383   Anemia Panel: No results for input(s): VITAMINB12, FOLATE, FERRITIN, TIBC, IRON, RETICCTPCT in the last 72 hours. Sepsis Labs:  Recent Labs Lab 05/15/16 1703 05/15/16 2019  LATICACIDVEN 1.51 0.88    Recent Results (from the past 240 hour(s))  Blood Culture (routine x 2)     Status: None (Preliminary result)   Collection Time: 05/15/16  5:11 PM  Result Value Ref Range Status   Specimen Description   Final    BLOOD RIGHT ARM BOTTLES DRAWN AEROBIC AND ANAEROBIC   Special Requests NONE  Final   Culture   Final    NO GROWTH 3 DAYS Performed at [facility], [place] [address], [place], [place] [address]    Report Status PENDING  Incomplete  Urine culture      Status: Abnormal   Collection Time: 05/15/16  5:43 PM  Result Value Ref Range Status   Specimen Description URINE, RANDOM  Final   Special Requests NONE  Final   Culture MULTIPLE SPECIES PRESENT, SUGGEST RECOLLECTION (A)  Final   Report Status 05/17/2016 FINAL  Final  Blood Culture (routine x 2)     Status: None (Preliminary result)   Collection Time: 05/15/16  6:00 PM  Result Value Ref Range Status   Specimen Description BLOOD FOREARM BOTTLES DRAWN AEROBIC AND ANAEROBIC  Final   Special Requests NONE  Final   Culture   Final    NO GROWTH 3 DAYS Performed at [facility], [place] [address] [place], [place] [address]    Report Status PENDING  Incomplete  MRSA PCR Screening     Status: None  Collection Time: 05/16/16 12:00 AM  Result Value Ref Range Status   MRSA by PCR NEGATIVE NEGATIVE Final    Comment:        The GeneXpert MRSA Assay (FDA approved for NASAL specimens only), is one component of a comprehensive MRSA colonization surveillance program. It is not intended to diagnose MRSA infection nor to guide or monitor treatment for MRSA infections.   Aerobic Culture (superficial specimen)     Status: None   Collection Time: 05/16/16 12:01 AM  Result Value Ref Range Status   Specimen Description WOUND RIGHT ARM  Final   Special Requests NONE  Final   Gram Stain   Final    RARE WBC PRESENT, PREDOMINANTLY MONONUCLEAR RARE GRAM POSITIVE COCCOBACILLUS    Culture NORMAL SKIN FLORA  Final   Report Status 05/18/2016 FINAL  Final         Radiology Studies: No results found.      Scheduled Meds: . enoxaparin (LOVENOX) injection  60 mg Subcutaneous Q24H  . hydrocortisone sod succinate (SOLU-CORTEF) inj  50 mg Intravenous Q12H  . metoprolol tartrate  12.5 mg Oral TID  . piperacillin-tazobactam (ZOSYN)  IV  3.375 g Intravenous Q8H  . sodium chloride flush  3 mL Intravenous Q12H   Continuous Infusions: . sodium chloride 100 mL/hr at 05/18/16 0347     LOS: 3  days    Dron Tanna Furry, MD [facility] Pager [phone]  If 7PM-7AM, please contact night-coverage [email or web address] Password [facility] 05/18/2016, 3:05 PM

## 2016-05-18 NOTE — Progress Notes (Signed)
Pharmacy Antibiotic Note  Anne Robinson is a 26 y.o. female r/o sepsis.  Pharmacy has been consulted for Zosyn dosing for sepsis with right axillary abscess, today is day #4. Right arm wound culture could be a contaminant, other cultures ngtd. Afebrile, renal function back to normal.   Plan: -Continue Zosyn 3.375 gm IV q8h (ibfused over 4 hrs) -Pharmacy signing off -Consider changing to Augmentin as an oral agent    Height: 4\' 11"  (149.9 cm) Weight: 259 lb (117.5 kg) IBW/kg (Calculated) : 43.2  Temp (24hrs), Avg:97.6 F (36.4 C), Min:97.3 F (36.3 C), Max:97.9 F (36.6 C)   Recent Labs Lab 05/15/16 1644 05/15/16 1703 05/15/16 2019 05/16/16 0010 05/16/16 0458 05/17/16 0159  WBC 3.4*  --   --   --  3.4* 2.7*  CREATININE 1.33*  --   --  1.43*  --  0.98  LATICACIDVEN  --  1.51 0.88  --   --   --     Estimated Creatinine Clearance: 100.1 mL/min (by C-G formula based on SCr of 0.98 mg/dL).    Allergies  Allergen Reactions  . Lisinopril Swelling    Antimicrobials this admission: Zosyn 3/11 >>  Vancomycin 3/11 >> 3/12  Dose adjustments this admission:   Microbiology results: 3/11 BCx: ngtd 3/11 UCx: multiple species present 3/12 R arm wound Cx: rare GP coccobacillus (pending)  3/12 MRSA PCR: negative   Thank you for allowing pharmacy to be a part of this patient's care.  Renold Genta, PharmD, BCPS Clinical Pharmacist Phone for toda y - [place] - [phone] 05/18/2016 9:43 AM

## 2016-05-19 DIAGNOSIS — I959 Hypotension, unspecified: Secondary | ICD-10-CM

## 2016-05-19 MED ORDER — AMOXICILLIN-POT CLAVULANATE 875-125 MG PO TABS: 1.0000 | ORAL_TABLET | Freq: Two times a day (BID) | ORAL | Status: DC

## 2016-05-19 MED ORDER — AMOXICILLIN-POT CLAVULANATE 875-125 MG PO TABS: 1.0000 | ORAL_TABLET | Freq: Two times a day (BID) | ORAL | 0 refills | Status: AC

## 2016-05-19 NOTE — Progress Notes (Signed)
Anne Robinson to be D/C'd Home per MD order.  Discussed with the patient and all questions fully answered.  VSS, Skin clean, dry and intact without evidence of skin break down, no evidence of skin tears noted. IV catheter discontinued intact. Site without signs and symptoms of complications. Dressing and pressure applied.  An After Visit Summary was printed and given to the patient. Patient received prescription.  D/c education completed with patient/family including follow up instructions, medication list, d/c activities limitations if indicated, with other d/c instructions as indicated by MD - patient able to verbalize understanding, all questions fully answered.   Patient instructed to return to ED, call 911, or call MD for any changes in condition.   Patient escorted via [place], and D/C home via private auto.  Anne Robinson 05/19/2016 1:04 PM

## 2016-05-19 NOTE — Discharge Summary (Signed)
Physician Discharge Summary  Anne Robinson ZOX:[id number] DOB: January 24, 1991 DOA: 05/15/2016  PCP: Hayden Rasmussen., MD  Admit date: 05/15/2016 Discharge date: 05/19/2016  Admitted From:home Disposition:home  Recommendations for Outpatient Follow-up:  1. Follow up with PCP in 1-2 weeks 2. Please obtain BMP/CBC in one week  Home Health:no Equipment/Devices:no Discharge Condition:stable CODE STATUS:full Diet recommendation:heart healthy  Brief/Interim Summary: 26 year old female with history of systemic lupus came to the ED with complaints of shortness of breath, nasal congestion and fever of 102.9 in ED. About 2 weeks prior to admission,  she was diagnosed with right armpit abscess which she was initially treated with Bactrim but failed the therapy. She then saw a general surgeon who drained it 3 days prior to her admission after which she continued to be febrile therefore came to the ED.  # Presumed Sepsis likely secondary to right axillary abscess:  -treated with zosyn for 5 days and switched to augmentin for 5 days on discharge. Blood culture is negative. Wound culture growing the skin flora. Patient clinically improved. Patient reported that she has follow-up with surgery within a week. I recommended patient to continue to follow-up with surgery and PCP as an outpatient. Patient verbalized understanding. -Bilateral axillary lymphadenopathy likely reactive. If does not improve then recommended outpatient follow-up and possible biopsy.  # Elevated troponin likely demand ischemia. Patient has no chest pain.   # transaminitis likely in the setting of hypotension. Liver enzymes are trending down . Hepatitis panel negative. HIV negative. Advised to follow-up with PCP.  #Acute kidney injury likely prerenal. Serum creatinine level improved.   #SLE: Patient reported that she is in remission and not on medications. Patient was not on steroid outpatient. I recommended patient to follow-up with  her rheumatologist.  #Morbid obesity: Recommended healthy diet and outpatient follow-up.  # Hypotension of unknown etiology: Blood pressure is improved. Encourage oral intake. Recommended to follow up with PCP. Patient stated that she does not have history of hypertension.  Discharge Diagnoses:  Principal Problem:   Sepsis ([place]) Active Problems:   Elevated troponin   Hypotension.   SLE (systemic lupus erythematosus) ([place])    Discharge Instructions  Discharge Instructions    Call MD for:  difficulty breathing, headache or visual disturbances    Complete by:  As directed    Call MD for:  extreme fatigue    Complete by:  As directed    Call MD for:  hives    Complete by:  As directed    Call MD for:  persistant dizziness or light-headedness    Complete by:  As directed    Call MD for:  persistant nausea and vomiting    Complete by:  As directed    Call MD for:  severe uncontrolled pain    Complete by:  As directed    Call MD for:  temperature >100.4    Complete by:  As directed    Diet - low sodium heart healthy    Complete by:  As directed    Discharge instructions    Complete by:  As directed    Please follow up with Surgery as already scheduled.   Increase activity slowly    Complete by:  As directed      Allergies as of 05/19/2016      Reactions   Lisinopril Swelling      Medication List    STOP taking these medications   fluticasone 50 MCG/ACT nasal spray Commonly known as:  FLONASE  traMADol 50 MG tablet Commonly known as:  ULTRAM     TAKE these medications   amoxicillin-clavulanate 875-125 MG tablet Commonly known as:  AUGMENTIN Take 1 tablet by mouth every 12 (twelve) hours.   GAS-X PO Take 1 tablet by mouth daily as needed (for flatulence).   levonorgestrel-ethinyl estradiol 0.15-0.03 MG tablet Commonly known as:  SEASONALE,INTROVALE,JOLESSA Take 1 tablet by mouth daily.   loratadine 10 MG tablet Commonly known as:  CLARITIN Take 10 mg by  mouth daily as needed for allergies.      Follow-up Information    Hayden Rasmussen., MD. Schedule an appointment as soon as possible for a visit in 1 week(s).   Specialty:  Family Medicine Contact information: [place] [address] [phone]          Allergies  Allergen Reactions  . Lisinopril Swelling    Consultations: None  Procedures/Studies: None  Subjective: Patient was seen and examined at bedside. Reported doing well. Denied headache, dizziness, nausea, vomiting, chest pain or shortness of breath. Denies any pain.  Discharge Exam: Vitals:   05/18/16 2206 05/19/16 0606  BP: 102/62 109/63  Pulse: 90 (!) 108  Resp: 18 20  Temp: 97.6 F (36.4 C) 98.6 F (37 C)   Vitals:   05/18/16 1003 05/18/16 1445 05/18/16 2206 05/19/16 0606  BP: (!) 96/58 109/79 102/62 109/63  Pulse: 87 86 90 (!) 108  Resp:  20 18 20   Temp:  97.4 F (36.3 C) 97.6 F (36.4 C) 98.6 F (37 C)  TempSrc:  Oral Oral   SpO2:  100% 100% 100%  Weight:      Height:        General: Pt is alert, awake, not in acute distress Cardiovascular: RRR, S1/S2 +, no rubs, no gallops Respiratory: CTA bilaterally, no wheezing, no rhonchi Abdominal: Soft, NT, ND, bowel sounds + Extremities: no edema, no cyanosis.     The results of significant diagnostics from this hospitalization (including imaging, microbiology, ancillary and laboratory) are listed below for reference.     Microbiology: Recent Results (from the past 240 hour(s))  Blood Culture (routine x 2)     Status: None (Preliminary result)   Collection Time: 05/15/16  5:11 PM  Result Value Ref Range Status   Specimen Description   Final    BLOOD RIGHT ARM BOTTLES DRAWN AEROBIC AND ANAEROBIC   Special Requests NONE  Final   Culture   Final    NO GROWTH 3 DAYS Performed at [facility], [address] [place], [place] [address]    Report Status PENDING  Incomplete  Urine culture     Status: Abnormal    Collection Time: 05/15/16  5:43 PM  Result Value Ref Range Status   Specimen Description URINE, RANDOM  Final   Special Requests NONE  Final   Culture MULTIPLE SPECIES PRESENT, SUGGEST RECOLLECTION (A)  Final   Report Status 05/17/2016 FINAL  Final  Blood Culture (routine x 2)     Status: None (Preliminary result)   Collection Time: 05/15/16  6:00 PM  Result Value Ref Range Status   Specimen Description BLOOD FOREARM BOTTLES DRAWN AEROBIC AND ANAEROBIC  Final   Special Requests NONE  Final   Culture   Final    NO GROWTH 3 DAYS Performed at [facility], [place] [address], [place], [place] [address]    Report Status PENDING  Incomplete  MRSA PCR Screening     Status: None  Collection Time: 05/16/16 12:00 AM  Result Value Ref Range Status   MRSA by PCR NEGATIVE NEGATIVE Final    Comment:        The GeneXpert MRSA Assay (FDA approved for NASAL specimens only), is one component of a comprehensive MRSA colonization surveillance program. It is not intended to diagnose MRSA infection nor to guide or monitor treatment for MRSA infections.   Aerobic Culture (superficial specimen)     Status: None   Collection Time: 05/16/16 12:01 AM  Result Value Ref Range Status   Specimen Description WOUND RIGHT ARM  Final   Special Requests NONE  Final   Gram Stain   Final    RARE WBC PRESENT, PREDOMINANTLY MONONUCLEAR RARE GRAM POSITIVE COCCOBACILLUS    Culture NORMAL SKIN FLORA  Final   Report Status 05/18/2016 FINAL  Final     Labs: BNP (last 3 results) No results for input(s): BNP in the last 8760 hours. Basic Metabolic Panel:  Recent Labs Lab 05/15/16 1644 05/16/16 0010 05/17/16 0159 05/18/16 1229  NA 128* 132* 130* 136  K 4.6 4.2 4.9 4.3  CL 103 109 108 109  CO2 19* 14* 17* 22  GLUCOSE 98 108* 156* 178*  BUN 18 17 14 10   CREATININE 1.33* 1.43* 0.98 0.85  CALCIUM 8.1* 7.0* 7.0* 7.4*   Liver Function Tests:  Recent Labs Lab 05/15/16 1644 05/17/16 0159  05/18/16 1229  AST 609* 700* 188*  ALT 470* 712* 381*  ALKPHOS 45 46 41  BILITOT 0.6 0.7 0.5  PROT 8.4* 7.4 6.6  ALBUMIN 2.8* 2.3* 2.1*   No results for input(s): LIPASE, AMYLASE in the last 168 hours. No results for input(s): AMMONIA in the last 168 hours. CBC:  Recent Labs Lab 05/15/16 1644 05/16/16 0458 05/17/16 0159 05/18/16 1229  WBC 3.4* 3.4* 2.7* 6.5  NEUTROABS 2.4  --   --   --   HGB 11.2* 10.0* 9.6* 9.9*  HCT 32.8* 30.9* 28.8* 30.4*  MCV 86.1 86.8 86.5 87.4  PLT 174 175 193 258   Cardiac Enzymes:  Recent Labs Lab 05/15/16 1644 05/15/16 2007 05/16/16 0010 05/16/16 0458 05/16/16 1100  TROPONINI 0.41* 0.48* 0.32* 0.19* 0.15*   BNP: Invalid input(s): POCBNP CBG: No results for input(s): GLUCAP in the last 168 hours. D-Dimer No results for input(s): DDIMER in the last 72 hours. Hgb A1c  Recent Labs  05/17/16 0203  HGBA1C 5.7*   Lipid Profile No results for input(s): CHOL, HDL, LDLCALC, TRIG, CHOLHDL, LDLDIRECT in the last 72 hours. Thyroid function studies  Recent Labs  05/16/16 1543  TSH 1.383   Anemia work up No results for input(s): VITAMINB12, FOLATE, FERRITIN, TIBC, IRON, RETICCTPCT in the last 72 hours. Urinalysis    Component Value Date/Time   COLORURINE AMBER (A) 05/15/2016 1743   APPEARANCEUR CLOUDY (A) 05/15/2016 1743   LABSPEC 1.036 (H) 05/15/2016 1743   PHURINE 6.0 05/15/2016 1743   GLUCOSEU NEGATIVE 05/15/2016 1743   HGBUR SMALL (A) 05/15/2016 1743   BILIRUBINUR SMALL (A) 05/15/2016 1743   KETONESUR 15 (A) 05/15/2016 1743   PROTEINUR >300 (A) 05/15/2016 1743   UROBILINOGEN 1.0 10/09/2011 1941   NITRITE NEGATIVE 05/15/2016 1743   LEUKOCYTESUR NEGATIVE 05/15/2016 1743   Sepsis Labs Invalid input(s): PROCALCITONIN,  WBC,  LACTICIDVEN Microbiology Recent Results (from the past 240 hour(s))  Blood Culture (routine x 2)     Status: None (Preliminary result)   Collection Time: 05/15/16  5:11 PM  Result Value Ref Range Status  Specimen Description   Final    BLOOD RIGHT ARM BOTTLES DRAWN AEROBIC AND ANAEROBIC   Special Requests NONE  Final   Culture   Final    NO GROWTH 3 DAYS Performed at [facility], [address] [place], [place] [address]    Report Status PENDING  Incomplete  Urine culture     Status: Abnormal   Collection Time: 05/15/16  5:43 PM  Result Value Ref Range Status   Specimen Description URINE, RANDOM  Final   Special Requests NONE  Final   Culture MULTIPLE SPECIES PRESENT, SUGGEST RECOLLECTION (A)  Final   Report Status 05/17/2016 FINAL  Final  Blood Culture (routine x 2)     Status: None (Preliminary result)   Collection Time: 05/15/16  6:00 PM  Result Value Ref Range Status   Specimen Description BLOOD FOREARM BOTTLES DRAWN AEROBIC AND ANAEROBIC  Final   Special Requests NONE  Final   Culture   Final    NO GROWTH 3 DAYS Performed at [facility], [place] [address], [place], [place] [address]    Report Status PENDING  Incomplete  MRSA PCR Screening     Status: None   Collection Time: 05/16/16 12:00 AM  Result Value Ref Range Status   MRSA by PCR NEGATIVE NEGATIVE Final    Comment:        The GeneXpert MRSA Assay (FDA approved for NASAL specimens only), is one component of a comprehensive MRSA colonization surveillance program. It is not intended to diagnose MRSA infection nor to guide or monitor treatment for MRSA infections.   Aerobic Culture (superficial specimen)     Status: None   Collection Time: 05/16/16 12:01 AM  Result Value Ref Range Status   Specimen Description WOUND RIGHT ARM  Final   Special Requests NONE  Final   Gram Stain   Final    RARE WBC PRESENT, PREDOMINANTLY MONONUCLEAR RARE GRAM POSITIVE COCCOBACILLUS    Culture NORMAL SKIN FLORA  Final   Report Status 05/18/2016 FINAL  Final     Time coordinating discharge: 27 minutes  SIGNED:   Rosita Fire, MD  [facility] 05/19/2016, 11:06 AM  If 7PM-7AM, please contact  night-coverage [email or web address] Password [email or web address]

## 2016-05-20 LAB — CULTURE, BLOOD (ROUTINE X 2)
CULTURE: NO GROWTH
CULTURE: NO GROWTH

## 2017-01-23 ENCOUNTER — Emergency Department (HOSPITAL_BASED_OUTPATIENT_CLINIC_OR_DEPARTMENT_OTHER): Payer: 59

## 2017-01-23 ENCOUNTER — Encounter (HOSPITAL_BASED_OUTPATIENT_CLINIC_OR_DEPARTMENT_OTHER): Payer: Self-pay | Admitting: *Deleted

## 2017-01-23 ENCOUNTER — Other Ambulatory Visit: Payer: Self-pay

## 2017-01-23 ENCOUNTER — Inpatient Hospital Stay (HOSPITAL_BASED_OUTPATIENT_CLINIC_OR_DEPARTMENT_OTHER)
Admission: EM | Admit: 2017-01-23 | Discharge: 2017-01-28 | DRG: 872 | Disposition: A | Discharge status: Home / Self Care | Payer: 59 | Attending: Family Medicine | Admitting: Family Medicine

## 2017-01-23 DIAGNOSIS — R0609 Other forms of dyspnea: Secondary | ICD-10-CM | POA: Diagnosis not present

## 2017-01-23 DIAGNOSIS — R21 Rash and other nonspecific skin eruption: Secondary | ICD-10-CM | POA: Diagnosis not present

## 2017-01-23 DIAGNOSIS — L03112 Cellulitis of left axilla: Secondary | ICD-10-CM | POA: Diagnosis present

## 2017-01-23 DIAGNOSIS — I10 Essential (primary) hypertension: Secondary | ICD-10-CM | POA: Diagnosis present

## 2017-01-23 DIAGNOSIS — N179 Acute kidney failure, unspecified: Secondary | ICD-10-CM | POA: Diagnosis not present

## 2017-01-23 DIAGNOSIS — Z6841 Body Mass Index (BMI) 40.0 and over, adult: Secondary | ICD-10-CM

## 2017-01-23 DIAGNOSIS — L03313 Cellulitis of chest wall: Secondary | ICD-10-CM | POA: Diagnosis present

## 2017-01-23 DIAGNOSIS — M329 Systemic lupus erythematosus, unspecified: Secondary | ICD-10-CM | POA: Diagnosis present

## 2017-01-23 DIAGNOSIS — R0682 Tachypnea, not elsewhere classified: Secondary | ICD-10-CM | POA: Diagnosis not present

## 2017-01-23 DIAGNOSIS — A419 Sepsis, unspecified organism: Secondary | ICD-10-CM | POA: Diagnosis present

## 2017-01-23 DIAGNOSIS — L02419 Cutaneous abscess of limb, unspecified: Secondary | ICD-10-CM

## 2017-01-23 DIAGNOSIS — E669 Obesity, unspecified: Secondary | ICD-10-CM | POA: Diagnosis present

## 2017-01-23 DIAGNOSIS — R0600 Dyspnea, unspecified: Secondary | ICD-10-CM | POA: Diagnosis present

## 2017-01-23 DIAGNOSIS — R231 Pallor: Secondary | ICD-10-CM | POA: Diagnosis present

## 2017-01-23 DIAGNOSIS — Z79899 Other long term (current) drug therapy: Secondary | ICD-10-CM

## 2017-01-23 DIAGNOSIS — I5030 Unspecified diastolic (congestive) heart failure: Secondary | ICD-10-CM | POA: Diagnosis not present

## 2017-01-23 DIAGNOSIS — L02412 Cutaneous abscess of left axilla: Secondary | ICD-10-CM | POA: Insufficient documentation

## 2017-01-23 DIAGNOSIS — L0390 Cellulitis, unspecified: Secondary | ICD-10-CM

## 2017-01-23 LAB — CBC WITH DIFFERENTIAL/PLATELET
BASOS PCT: 0 %
Basophils Absolute: 0 10*3/uL (ref 0.0–0.1)
Eosinophils Absolute: 0 10*3/uL (ref 0.0–0.7)
Eosinophils Relative: 0 %
HCT: 31.4 % — ABNORMAL LOW (ref 36.0–46.0)
Hemoglobin: 10.3 g/dL — ABNORMAL LOW (ref 12.0–15.0)
LYMPHS PCT: 18 %
Lymphs Abs: 0.9 10*3/uL (ref 0.7–4.0)
MCH: 28.8 pg (ref 26.0–34.0)
MCHC: 32.8 g/dL (ref 30.0–36.0)
MCV: 87.7 fL (ref 78.0–100.0)
MONO ABS: 0.1 10*3/uL (ref 0.1–1.0)
MONOS PCT: 1 %
NEUTROS ABS: 3.9 10*3/uL (ref 1.7–7.7)
NEUTROS PCT: 81 %
PLATELETS: 268 10*3/uL (ref 150–400)
RBC: 3.58 MIL/uL — ABNORMAL LOW (ref 3.87–5.11)
RDW: 13.6 % (ref 11.5–15.5)
WBC: 4.8 10*3/uL (ref 4.0–10.5)

## 2017-01-23 LAB — COMPREHENSIVE METABOLIC PANEL
ALBUMIN: 2.9 g/dL — AB (ref 3.5–5.0)
ALT: 38 U/L (ref 14–54)
ANION GAP: 4 — AB (ref 5–15)
AST: 90 U/L — ABNORMAL HIGH (ref 15–41)
Alkaline Phosphatase: 44 U/L (ref 38–126)
BUN: 14 mg/dL (ref 6–20)
CALCIUM: 8.2 mg/dL — AB (ref 8.9–10.3)
CHLORIDE: 109 mmol/L (ref 101–111)
CO2: 21 mmol/L — AB (ref 22–32)
Creatinine, Ser: 0.8 mg/dL (ref 0.44–1.00)
GFR calc non Af Amer: >60 mL/min (ref >60)
GLUCOSE: 99 mg/dL (ref 65–99)
POTASSIUM: 2.8 mmol/L — AB (ref 3.5–5.1)
SODIUM: 134 mmol/L — AB (ref 135–145)
Total Bilirubin: 0.6 mg/dL (ref 0.3–1.2)
Total Protein: 8.9 g/dL — ABNORMAL HIGH (ref 6.5–8.1)

## 2017-01-23 LAB — I-STAT CG4 LACTIC ACID, ED
LACTIC ACID, VENOUS: 2.26 mmol/L — AB (ref 0.5–1.9)
LACTIC ACID, VENOUS: 1.51 mmol/L (ref 0.5–1.9)

## 2017-01-23 MED ORDER — KETOROLAC TROMETHAMINE 15 MG/ML IJ SOLN
INTRAMUSCULAR | Status: AC
  Filled 2017-01-23: qty 1

## 2017-01-23 MED ORDER — VANCOMYCIN HCL IN DEXTROSE 1-5 GM/200ML-% IV SOLN
1000.0000 mg | Freq: Three times a day (TID) | INTRAVENOUS | Status: DC
  Administered 2017-01-24: 1000 mg via INTRAVENOUS
  Filled 2017-01-23: qty 200

## 2017-01-23 MED ORDER — PIPERACILLIN-TAZOBACTAM 3.375 G IVPB 30 MIN
3.3750 g | Freq: Once | INTRAVENOUS | Status: AC
  Administered 2017-01-23: 3.375 g via INTRAVENOUS
  Filled 2017-01-23 (×2): qty 50

## 2017-01-23 MED ORDER — DIAZEPAM 2 MG PO TABS
2.0000 mg | ORAL_TABLET | Freq: Once | ORAL | Status: AC
  Administered 2017-01-23: 2 mg via ORAL
  Filled 2017-01-23: qty 1

## 2017-01-23 MED ORDER — ONDANSETRON HCL 4 MG/2ML IJ SOLN
INTRAMUSCULAR | Status: AC
  Filled 2017-01-23: qty 2

## 2017-01-23 MED ORDER — SODIUM CHLORIDE 0.9 % IV SOLN
1000.0000 mL | INTRAVENOUS | Status: DC
  Administered 2017-01-23 – 2017-01-25 (×3): 1000 mL via INTRAVENOUS

## 2017-01-23 MED ORDER — KETOROLAC TROMETHAMINE 15 MG/ML IJ SOLN
15.0000 mg | Freq: Once | INTRAMUSCULAR | Status: AC
  Administered 2017-01-23: 15 mg via INTRAVENOUS
  Filled 2017-01-23: qty 1

## 2017-01-23 MED ORDER — POTASSIUM CHLORIDE CRYS ER 20 MEQ PO TBCR
40.0000 meq | EXTENDED_RELEASE_TABLET | Freq: Once | ORAL | Status: AC
  Administered 2017-01-23: 40 meq via ORAL
  Filled 2017-01-23: qty 2

## 2017-01-23 MED ORDER — PIPERACILLIN-TAZOBACTAM 3.375 G IVPB
3.3750 g | Freq: Three times a day (TID) | INTRAVENOUS | Status: DC
  Administered 2017-01-24 – 2017-01-25 (×5): 3.375 g via INTRAVENOUS
  Filled 2017-01-23 (×5): qty 50

## 2017-01-23 MED ORDER — VANCOMYCIN HCL IN DEXTROSE 1-5 GM/200ML-% IV SOLN
1000.0000 mg | Freq: Once | INTRAVENOUS | Status: AC
  Administered 2017-01-23: 1000 mg via INTRAVENOUS
  Filled 2017-01-23: qty 200

## 2017-01-23 MED ORDER — ONDANSETRON HCL 4 MG/2ML IJ SOLN
4.0000 mg | Freq: Once | INTRAMUSCULAR | Status: AC
  Administered 2017-01-23: 4 mg via INTRAVENOUS

## 2017-01-23 MED ORDER — SODIUM CHLORIDE 0.9 % IV BOLUS (SEPSIS)
1000.0000 mL | Freq: Once | INTRAVENOUS | Status: AC
  Administered 2017-01-23: 1000 mL via INTRAVENOUS

## 2017-01-23 MED ORDER — POTASSIUM CHLORIDE 10 MEQ/100ML IV SOLN
10.0000 meq | Freq: Once | INTRAVENOUS | Status: AC
  Administered 2017-01-23: 10 meq via INTRAVENOUS
  Filled 2017-01-23: qty 100

## 2017-01-23 NOTE — ED Triage Notes (Addendum)
Sob. She is in no distress at triage. Lungs are clear. Sisters state she was seen by her MD in [place] today for abscess recheck to her left axilla. It has been draining for almost 2 weeks. It has not been I&D'd. She has been on antibiotics for almost 2 weeks. Low grade fever today.

## 2017-01-23 NOTE — ED Notes (Signed)
Patient transported to X-ray 

## 2017-01-23 NOTE — ED Provider Notes (Signed)
[facility] Provider Note  CSN: [id number] Arrival date & time: 01/23/17 1910  Chief Complaint(s) Shortness of Breath and Abscess  HPI Anne Robinson is a 26 y.o. female with a history of SLE on CellCept with history of sepsis secondary to abscess presents to the emergency department with left axillary abscess that has been managed by her dermatologist with 10 days of doxycycline that was unsuccessfully treated.  Patient followed up with the dermatologist who changed her antibiotics to Bactrim however patient presented to the emergency department today due to feeling subjective fevers, feeling short of breath and generally fatigued.  Patient reports that the abscesses draining purulence.  States that her symptoms are similar to her previous admission for sepsis in March.  Denies any chest pain, nausea, vomiting, abdominal pain.  Denies any urinary symptoms.    The history is provided by the patient.    Past Medical History Past Medical History:  Diagnosis Date  . HTN (hypertension) 05/15/2016  . Lupus   . Lupus nephritis ([place])   . Renal disorder    Patient Active Problem List   Diagnosis Date Noted  . Abscess of axilla, left 01/23/2017  . Hypotension   . Sepsis ([place]) 05/15/2016  . Elevated troponin 05/15/2016  . HTN (hypertension) 05/15/2016  . SLE (systemic lupus erythematosus) ([place]) 05/15/2016   Home Medication(s) Prior to Admission medications   Medication Sig Start Date End Date Taking? Authorizing Provider  fluconazole (DIFLUCAN) 150 MG tablet Take 150 mg daily as needed by mouth (yeast infection).  01/11/17  Yes [provider]  ibuprofen (ADVIL,MOTRIN) 800 MG tablet Take 800 mg every 8 (eight) hours as needed by mouth for headache, mild pain or moderate pain.  01/13/17  Yes [provider]  loratadine (CLARITIN) 10 MG tablet Take 10 mg by mouth daily as needed for allergies.    Yes [provider]    sulfamethoxazole-trimethoprim (BACTRIM DS,SEPTRA DS) 800-160 MG tablet Take 1 tablet 2 (two) times daily by mouth. 01/18/17  Yes [provider]                                                                                                                                    Past Surgical History Past Surgical History:  Procedure Laterality Date  . RENAL BIOPSY     Family History History reviewed. No pertinent family history.  Social History Social History   Tobacco Use  . Smoking status: Never Smoker  . Smokeless tobacco: Never Used  Substance Use Topics  . Alcohol use: No  . Drug use: No   Allergies Lisinopril  Review of Systems Review of Systems All other systems are reviewed and are negative for acute change except as noted in the HPI  Physical Exam Vital Signs  I have reviewed the triage vital signs BP 111/79   Pulse (!) 142   Temp 99.4 F (37.4 C) (Oral)  Resp 18   Ht 4\' 11"  (1.499 m)   Wt 117.5 kg (259 lb)   LMP 12/19/2016   SpO2 100%   BMI 52.31 kg/m   Physical Exam  Constitutional: She is oriented to person, place, and time. She appears well-developed and well-nourished. She appears ill. No distress.  HENT:  Head: Normocephalic and atraumatic.  Nose: Nose normal.  Eyes: Conjunctivae and EOM are normal. Pupils are equal, round, and reactive to light. Right eye exhibits no discharge. Left eye exhibits no discharge. No scleral icterus.  Neck: Normal range of motion. Neck supple.  Cardiovascular: Regular rhythm. Tachycardia present. Exam reveals no gallop and no friction rub.  No murmur heard. Pulmonary/Chest: Effort normal and breath sounds normal. No stridor. No respiratory distress. She has no rales.  Abdominal: Soft. She exhibits no distension. There is no tenderness.  Musculoskeletal: She exhibits no edema or tenderness.       Arms: Neurological: She is alert and oriented to person, place, and time.  Skin: Skin is warm and dry. No rash  noted. She is not diaphoretic. No erythema.  Psychiatric: She has a normal mood and affect.  Vitals reviewed.   ED Results and Treatments Labs (all labs ordered are listed, but only abnormal results are displayed) Labs Reviewed  COMPREHENSIVE METABOLIC PANEL - Abnormal; Notable for the following components:      Result Value   Sodium 134 (*)    Potassium 2.8 (*)    CO2 21 (*)    Calcium 8.2 (*)    Total Protein 8.9 (*)    Albumin 2.9 (*)    AST 90 (*)    Anion gap 4 (*)    All other components within normal limits  CBC WITH DIFFERENTIAL/PLATELET - Abnormal; Notable for the following components:   RBC 3.58 (*)    Hemoglobin 10.3 (*)    HCT 31.4 (*)    All other components within normal limits  I-STAT CG4 LACTIC ACID, ED - Abnormal; Notable for the following components:   Lactic Acid, Venous 2.26 (*)    All other components within normal limits  CULTURE, BLOOD (ROUTINE X 2)  CULTURE, BLOOD (ROUTINE X 2)  URINALYSIS, ROUTINE W REFLEX MICROSCOPIC  I-STAT CG4 LACTIC ACID, ED                                                                                                                         EKG  EKG Interpretation  Date/Time:  Monday January 23 2017 19:40:59 EST Ventricular Rate:  136 PR Interval:    QRS Duration: 74 QT Interval:  295 QTC Calculation: 444 R Axis:   73 Text Interpretation:  Sinus tachycardia Low voltage, precordial leads No significant change since last tracing Confirmed by Addison Lank [id number]) on 01/23/2017 7:45:47 PM      Radiology Dg Chest 2 View  Result Date: 01/23/2017 CLINICAL DATA:  Dyspnea with tachycardia. EXAM: CHEST  2 VIEW COMPARISON:  None. FINDINGS: Slightly low lung volumes  with pulmonary vascular congestion and borderline cardiomegaly. No aortic aneurysm. Slight obscuration of the costophrenic angles may be secondary to trace pleural effusions. No acute osseous abnormality. IMPRESSION: Mild pulmonary vascular congestion and borderline  cardiomegaly. Obscuration of the costophrenic angles are suspicious for small pleural effusions. Electronically Signed   By: Ashley Royalty M.D.   On: 01/23/2017 20:12   Pertinent labs & imaging results that were available during my care of the patient were reviewed by me and considered in my medical decision making (see chart for details).  Medications Ordered in ED Medications  0.9 %  sodium chloride infusion (1,000 mLs Intravenous Transfusing/Transfer 01/23/17 2308)  piperacillin-tazobactam (ZOSYN) IVPB 3.375 g (not administered)  vancomycin (VANCOCIN) IVPB 1000 mg/200 mL premix (not administered)  sodium chloride 0.9 % bolus 1,000 mL (0 mLs Intravenous Stopped 01/23/17 2137)  ondansetron (ZOFRAN) injection 4 mg (4 mg Intravenous Given 01/23/17 2018)  ketorolac (TORADOL) 15 MG/ML injection 15 mg (15 mg Intravenous Given 01/23/17 2034)  piperacillin-tazobactam (ZOSYN) IVPB 3.375 g (0 g Intravenous Stopped 01/23/17 2140)  vancomycin (VANCOCIN) IVPB 1000 mg/200 mL premix (0 mg Intravenous Stopped 01/23/17 2155)  potassium chloride 10 mEq in 100 mL IVPB (0 mEq Intravenous Stopping Infusion hung by another clincian 01/23/17 2347)  potassium chloride SA (K-DUR,KLOR-CON) CR tablet 40 mEq (40 mEq Oral Given 01/23/17 2141)  diazepam (VALIUM) tablet 2 mg (2 mg Oral Given 01/23/17 2141)  acetaminophen (TYLENOL) tablet 1,000 mg (1,000 mg Oral Given 01/24/17 0046)                                                                                                                                    Procedures Procedures  (including critical care time)  Medical Decision Making / ED Course I have reviewed the nursing notes for this encounter and the patient's prior records (if available in EHR or on provided paperwork).    Patient with low-grade temperature, ill-appearing, tachycardic.  She is immunocompromise due to the CellCept.  Septic labs obtained which revealed elevated lactic acid.  Presentation is  concerning for sepsis secondary to axillary abscess.  Code sepsis was initiated and patient started on empiric antibiotics.  Patient given IV fluid bolus.  And admitted to medicine for further management.  Final Clinical Impression(s) / ED Diagnoses Final diagnoses:  Sepsis, due to unspecified organism [facility])  Axillary abscess      This chart was dictated using voice recognition software.  Despite best efforts to proofread,  errors can occur which can change the documentation meaning.   Fatima Blank, MD 01/24/17 [phone]

## 2017-01-23 NOTE — ED Notes (Signed)
ED Provider at bedside. 

## 2017-01-23 NOTE — ED Notes (Signed)
MD made aware of pt.

## 2017-01-23 NOTE — Progress Notes (Signed)
Pharmacy Antibiotic Note  Anne Robinson is a 26 y.o. female admitted on 01/23/2017 with cellulitis.  Pharmacy has been consulted for vancomycin and Zosyn dosing. Reported abscess on left axilla that has been draining for almost 2 weeks. She reports taking antibiotic during that time, but the antibiotic is not listed on medication reconciliation. WBC 4.8, afebrile, LA 2.26  Plan: Vancomycin 1000mg  IV every 8 hours.  Goal trough 15-20 mcg/mL. Zosyn 3.375g IV q8h (4 hour infusion).  Height: 4\' 11"  (149.9 cm) Weight: 259 lb (117.5 kg) IBW/kg (Calculated) : 43.2  Temp (24hrs), Avg:99.4 F (37.4 C), Min:99.4 F (37.4 C), Max:99.4 F (37.4 C)  Recent Labs  Lab 01/23/17 1935 01/23/17 1940  WBC 4.8  --   CREATININE 0.80  --   LATICACIDVEN  --  2.26*    Estimated Creatinine Clearance: 121.6 mL/min (by C-G formula based on SCr of 0.8 mg/dL).    Allergies  Allergen Reactions  . Lisinopril Swelling   Thank you for allowing pharmacy to be a part of this patient's care.  Jodean Lima Emie Sommerfeld 01/23/2017 9:26 PM

## 2017-01-23 NOTE — ED Notes (Signed)
Report given to [employer]

## 2017-01-24 ENCOUNTER — Encounter (HOSPITAL_COMMUNITY): Payer: Self-pay

## 2017-01-24 ENCOUNTER — Inpatient Hospital Stay (HOSPITAL_COMMUNITY): Payer: 59

## 2017-01-24 DIAGNOSIS — R0682 Tachypnea, not elsewhere classified: Secondary | ICD-10-CM

## 2017-01-24 DIAGNOSIS — L02412 Cutaneous abscess of left axilla: Secondary | ICD-10-CM | POA: Diagnosis present

## 2017-01-24 DIAGNOSIS — L02419 Cutaneous abscess of limb, unspecified: Secondary | ICD-10-CM

## 2017-01-24 DIAGNOSIS — R0600 Dyspnea, unspecified: Secondary | ICD-10-CM | POA: Diagnosis present

## 2017-01-24 DIAGNOSIS — R21 Rash and other nonspecific skin eruption: Secondary | ICD-10-CM | POA: Diagnosis present

## 2017-01-24 LAB — CBC
HEMATOCRIT: 29.4 % — AB (ref 36.0–46.0)
HEMOGLOBIN: 9.5 g/dL — AB (ref 12.0–15.0)
MCH: 28.5 pg (ref 26.0–34.0)
MCHC: 32.3 g/dL (ref 30.0–36.0)
MCV: 88.3 fL (ref 78.0–100.0)
Platelets: 247 10*3/uL (ref 150–400)
RBC: 3.33 MIL/uL — ABNORMAL LOW (ref 3.87–5.11)
RDW: 14 % (ref 11.5–15.5)
WBC: 4.7 10*3/uL (ref 4.0–10.5)

## 2017-01-24 LAB — COMPREHENSIVE METABOLIC PANEL
ALBUMIN: 2.3 g/dL — AB (ref 3.5–5.0)
ALK PHOS: 37 U/L — AB (ref 38–126)
ALT: 33 U/L (ref 14–54)
ANION GAP: 6 (ref 5–15)
AST: 74 U/L — AB (ref 15–41)
BUN: 16 mg/dL (ref 6–20)
CALCIUM: 7.6 mg/dL — AB (ref 8.9–10.3)
CO2: 20 mmol/L — AB (ref 22–32)
Chloride: 113 mmol/L — ABNORMAL HIGH (ref 101–111)
Creatinine, Ser: 1.19 mg/dL — ABNORMAL HIGH (ref 0.44–1.00)
GFR calc Af Amer: >60 mL/min (ref >60)
GFR calc non Af Amer: >60 mL/min (ref >60)
GLUCOSE: 122 mg/dL — AB (ref 65–99)
POTASSIUM: 4 mmol/L (ref 3.5–5.1)
SODIUM: 139 mmol/L (ref 135–145)
Total Bilirubin: 0.8 mg/dL (ref 0.3–1.2)
Total Protein: 7.3 g/dL (ref 6.5–8.1)

## 2017-01-24 LAB — URINALYSIS, ROUTINE W REFLEX MICROSCOPIC
Bilirubin Urine: NEGATIVE
GLUCOSE, UA: NEGATIVE mg/dL
KETONES UR: NEGATIVE mg/dL
Leukocytes, UA: NEGATIVE
NITRITE: NEGATIVE
PROTEIN: 30 mg/dL — AB
Specific Gravity, Urine: 1.013 (ref 1.005–1.030)
pH: 5 (ref 5.0–8.0)

## 2017-01-24 LAB — SEDIMENTATION RATE: Sed Rate: 60 mm/hr — ABNORMAL HIGH (ref 0–22)

## 2017-01-24 LAB — C-REACTIVE PROTEIN: CRP: 10.8 mg/dL — AB (ref <1.0)

## 2017-01-24 LAB — BRAIN NATRIURETIC PEPTIDE: B Natriuretic Peptide: 133.3 pg/mL — ABNORMAL HIGH (ref 0.0–100.0)

## 2017-01-24 LAB — VANCOMYCIN, TROUGH: Vancomycin Tr: 22 ug/mL (ref 15–20)

## 2017-01-24 MED ORDER — VITAMINS A & D EX OINT
TOPICAL_OINTMENT | CUTANEOUS | Status: AC
  Administered 2017-01-24: 1
  Filled 2017-01-24: qty 5

## 2017-01-24 MED ORDER — ENOXAPARIN SODIUM 40 MG/0.4ML ~~LOC~~ SOLN
40.0000 mg | SUBCUTANEOUS | Status: DC
  Administered 2017-01-24 – 2017-01-28 (×5): 40 mg via SUBCUTANEOUS
  Filled 2017-01-24 (×6): qty 0.4

## 2017-01-24 MED ORDER — HYDROMORPHONE HCL 1 MG/ML IJ SOLN
0.5000 mg | INTRAMUSCULAR | Status: DC | PRN
  Administered 2017-01-24: 0.5 mg via INTRAVENOUS
  Administered 2017-01-25: 1 mg via INTRAVENOUS
  Filled 2017-01-24 (×2): qty 1

## 2017-01-24 MED ORDER — BISACODYL 5 MG PO TBEC: 5.0000 mg | DELAYED_RELEASE_TABLET | Freq: Every day | ORAL | Status: DC | PRN

## 2017-01-24 MED ORDER — ACETAMINOPHEN 325 MG PO TABS: 650.0000 mg | ORAL_TABLET | Freq: Four times a day (QID) | ORAL | Status: DC | PRN

## 2017-01-24 MED ORDER — ONDANSETRON HCL 4 MG PO TABS: 4.0000 mg | ORAL_TABLET | Freq: Four times a day (QID) | ORAL | Status: DC | PRN

## 2017-01-24 MED ORDER — VANCOMYCIN HCL IN DEXTROSE 1-5 GM/200ML-% IV SOLN
1000.0000 mg | INTRAVENOUS | Status: DC
Start: 2017-01-25 — End: 2017-01-25
  Administered 2017-01-25: 1000 mg via INTRAVENOUS
  Filled 2017-01-24: qty 200

## 2017-01-24 MED ORDER — ONDANSETRON HCL 4 MG/2ML IJ SOLN
4.0000 mg | Freq: Four times a day (QID) | INTRAMUSCULAR | Status: DC | PRN
  Administered 2017-01-25 – 2017-01-28 (×2): 4 mg via INTRAVENOUS
  Filled 2017-01-24 (×2): qty 2

## 2017-01-24 MED ORDER — ACETAMINOPHEN 500 MG PO TABS
1000.0000 mg | ORAL_TABLET | Freq: Once | ORAL | Status: AC
  Administered 2017-01-24: 1000 mg via ORAL
  Filled 2017-01-24: qty 2

## 2017-01-24 MED ORDER — IBUPROFEN 200 MG PO TABS
400.0000 mg | ORAL_TABLET | Freq: Once | ORAL | Status: AC
  Administered 2017-01-24: 400 mg via ORAL
  Filled 2017-01-24: qty 2

## 2017-01-24 MED ORDER — ACETAMINOPHEN 650 MG RE SUPP
650.0000 mg | Freq: Four times a day (QID) | RECTAL | Status: DC | PRN
Start: 2017-01-24 — End: 2017-01-28

## 2017-01-24 NOTE — Progress Notes (Addendum)
PROGRESS NOTE    Anne Robinson  SVX:[id number] DOB: May 07, 1990 DOA: 01/23/2017 PCP: Karleen Hampshire., MD   Brief Narrative: (Start on day 1 of progress note - keep it brief and live) Anne Robinson is a 26 y.o. female with hx of SLE, R axillary abscess Mar '18 who presented to ED with 2 wk hx of drainage from L axilla, and a 24- 48 hr hx of fevers, chills and N/V and SOB.  In ED temp was 99 then 103deg.  BP's good, RR 25-35 and HR 120- 130.  She doesn't feel good. Asked to see for admission.   Pt has a left axillary abscess that has been managed by her dermatologist with 10 days of doxycycline that was unsuccessfully treated. Patient followed up with the dermatologist who changed her antibiotics to Bactrim.  However patient presented to the emergency department today due to feeling subjective fevers, feeling short of breath and generally fatigued. Patient reports that the abscesses has been draining pus for days.   Hx SLE x 5 yrs, was on Cellcept but her "markers" recently were good , not taking anything now.  Has a rash on both LE's, been there about 1 weeks, she is not sure what it is.   Assessment & Plan:   Principal Problem:   Abscess of axilla, left Active Problems:   Sepsis ([place])   HTN (hypertension)   SLE (systemic lupus erythematosus) (HCC)   Tachypnea   Dyspnea   Macular rash   Abscess of left axilla  1. Cellulitis  Possible Abscess  Fevers:  With previous hx of R sided abscess as well, consider hydradenitis (no hx of groin abscess).   1. Will ultrasound L axilla, likely will need surgery c/s  2. Continue vanc/zosyn   2. SLE - not on any meds, continue to monitor.  Check labs with rash as below.  3. Rash - Located on L arm and bilateral LE's. unclear cause, possibly related to lupus?  Some hand swelling as well.  Will check ESR, CRP (possibly elevated with cellulitis above), complements, ANA, anti dsDNA.  Consider steroid.  4. HTN - following BP's, no need for BP med  at this time  5. SOB -  CXR with borderline cardiomegaly, mild pulm vascular congestion and possible small pleural effusions.  She's got bibasilar crackles on exam.  Will check BNP.  Notes her SOB is better overall today.      DVT prophylaxis: lovenox Code Status: full  Family Communication: sister at bedside Disposition Plan: pending improvement   Consultants:   none  Procedures: (Don't include imaging studies which can be auto populated. Include things that cannot be auto populated i.e. Echo, Carotid and venous dopplers, Foley, Bipap, HD, tubes/drains, wound vac, central lines etc)  [place] pending  Antimicrobials: (specify start and planned stop date. Auto populated tables are space occupying and do not give end dates) Anti-infectives (From admission, onward)   Start     Dose/Rate Route Frequency Ordered Stop   01/25/17 0200  vancomycin (VANCOCIN) IVPB 1000 mg/200 mL premix     1,000 mg 200 mL/hr over 60 Minutes Intravenous Every 24 hours 01/24/17 1228     01/24/17 0400  piperacillin-tazobactam (ZOSYN) IVPB 3.375 g     3.375 g 12.5 mL/hr over 240 Minutes Intravenous Every 8 hours 01/23/17 2111     01/24/17 0200  vancomycin (VANCOCIN) IVPB 1000 mg/200 mL premix  Status:  Discontinued     1,000 mg 200 mL/hr over 60 Minutes  Intravenous Every 8 hours 01/23/17 2111 01/24/17 0835   01/23/17 2045  piperacillin-tazobactam (ZOSYN) IVPB 3.375 g     3.375 g 100 mL/hr over 30 Minutes Intravenous  Once 01/23/17 2031 01/23/17 2140   01/23/17 2045  vancomycin (VANCOCIN) IVPB 1000 mg/200 mL premix     1,000 mg 200 mL/hr over 60 Minutes Intravenous  Once 01/23/17 2031 01/23/17 2155      Subjective: Notes presented with SOB and fever.  Her SOB is better today.    Objective: Vitals:   01/24/17 0248 01/24/17 0500 01/24/17 0549 01/24/17 1400  BP:   124/81 132/83  Pulse:  (!) 110 (!) 109 96  Resp:   (!) 24   Temp: (!) 100.8 F (38.2 C)  98.4 F (36.9 C) 98.4 F (36.9 C)  TempSrc: Axillary   Oral Oral  SpO2:   100% 99%  Weight:  118 kg (260 lb 2.3 oz)    Height:        Intake/Output Summary (Last 24 hours) at 01/24/2017 1622 Last data filed at 01/24/2017 1500 Gross per 24 hour  Intake 2677.5 ml  Output 1 ml  Net 2676.5 ml   Filed Weights   01/23/17 1913 01/23/17 2347 01/24/17 0500  Weight: 117.5 kg (259 lb) 117.5 kg (259 lb 0.7 oz) 118 kg (260 lb 2.3 oz)    Examination:  General exam: Appears calm and comfortable  Respiratory system: Bibasilar crackles. Cardiovascular system: S1 & S2 heard, RRR. No JVD, murmurs, rubs, gallops or clicks. No pedal edema. Gastrointestinal system: Abdomen is nondistended, soft and nontender. No organomegaly or masses felt. Normal bowel sounds heard. Central nervous system: Alert and oriented. No focal neurological deficits. Extremities: Symmetric 5 x 5 power.  Swelling of hands, LE"s.  Skin: L axilla with large indurated area (no clear fluctuance) with central area that appears to have previously drained but is no longer.  Maculopapular rash on LE bilaterally, nonblanching rash to L arm Psychiatry: Judgement and insight appear normal. Mood & affect appropriate.     Data Reviewed: I have personally reviewed following labs and imaging studies  CBC: Recent Labs  Lab 01/23/17 1935 01/24/17 0448  WBC 4.8 4.7  NEUTROABS 3.9  --   HGB 10.3* 9.5*  HCT 31.4* 29.4*  MCV 87.7 88.3  PLT 268 817   Basic Metabolic Panel: Recent Labs  Lab 01/23/17 1935 01/24/17 0448  NA 134* 139  K 2.8* 4.0  CL 109 113*  CO2 21* 20*  GLUCOSE 99 122*  BUN 14 16  CREATININE 0.80 1.19*  CALCIUM 8.2* 7.6*   GFR: Estimated Creatinine Clearance: 81.9 mL/min (A) (by C-G formula based on SCr of 1.19 mg/dL (H)). Liver Function Tests: Recent Labs  Lab 01/23/17 1935 01/24/17 0448  AST 90* 74*  ALT 38 33  ALKPHOS 44 37*  BILITOT 0.6 0.8  PROT 8.9* 7.3  ALBUMIN 2.9* 2.3*   No results for input(s): LIPASE, AMYLASE in the last 168 hours. No  results for input(s): AMMONIA in the last 168 hours. Coagulation Profile: No results for input(s): INR, PROTIME in the last 168 hours. Cardiac Enzymes: No results for input(s): CKTOTAL, CKMB, CKMBINDEX, TROPONINI in the last 168 hours. BNP (last 3 results) No results for input(s): PROBNP in the last 8760 hours. HbA1C: No results for input(s): HGBA1C in the last 72 hours. CBG: No results for input(s): GLUCAP in the last 168 hours. Lipid Profile: No results for input(s): CHOL, HDL, LDLCALC, TRIG, CHOLHDL, LDLDIRECT in the last 72 hours.  Thyroid Function Tests: No results for input(s): TSH, T4TOTAL, FREET4, T3FREE, THYROIDAB in the last 72 hours. Anemia Panel: No results for input(s): VITAMINB12, FOLATE, FERRITIN, TIBC, IRON, RETICCTPCT in the last 72 hours. Sepsis Labs: Recent Labs  Lab 01/23/17 1940 01/23/17 2233  LATICACIDVEN 2.26* 1.51    Recent Results (from the past 240 hour(s))  Blood culture (routine x 2)     Status: None (Preliminary result)   Collection Time: 01/23/17  7:35 PM  Result Value Ref Range Status   Specimen Description BLOOD RIGHT ANTECUBITAL  Final   Special Requests   Final    BOTTLES DRAWN AEROBIC AND ANAEROBIC Blood Culture adequate volume   Culture   Final    NO GROWTH < 12 HOURS Performed at [facility], [place] [address] [place], [place] [address]    Report Status PENDING  Incomplete  Blood culture (routine x 2)     Status: None (Preliminary result)   Collection Time: 01/23/17  7:49 PM  Result Value Ref Range Status   Specimen Description BLOOD BLOOD RIGHT HAND  Final   Special Requests   Final    IN PEDIATRIC BOTTLE Blood Culture results may not be optimal due to an inadequate volume of blood received in culture bottles   Culture   Final    NO GROWTH < 12 HOURS Performed at [facility], [place] [address] [place], [place] [address]    Report Status PENDING  Incomplete         Radiology Studies: Dg Chest 2 View  Result Date:  01/23/2017 CLINICAL DATA:  Dyspnea with tachycardia. EXAM: CHEST  2 VIEW COMPARISON:  None. FINDINGS: Slightly low lung volumes with pulmonary vascular congestion and borderline cardiomegaly. No aortic aneurysm. Slight obscuration of the costophrenic angles may be secondary to trace pleural effusions. No acute osseous abnormality. IMPRESSION: Mild pulmonary vascular congestion and borderline cardiomegaly. Obscuration of the costophrenic angles are suspicious for small pleural effusions. Electronically Signed   By: Ashley Royalty M.D.   On: 01/23/2017 20:12        Scheduled Meds: . enoxaparin (LOVENOX) injection  40 mg Subcutaneous Q24H   Continuous Infusions: . sodium chloride 1,000 mL (01/24/17 0549)  . piperacillin-tazobactam (ZOSYN)  IV 3.375 g (01/24/17 1335)  . [START ON 01/25/2017] vancomycin       LOS: 1 day    Time spent: over 30 minutes    Fayrene Helper, MD [facility] Pager [phone]  If 7PM-7AM, please contact night-coverage [email or web address] Password [email or web address] 01/24/2017, 4:22 PM

## 2017-01-24 NOTE — H&P (Signed)
[facility] History and Physical  KATELIN KUTSCH TMA:[id number] DOB: 1990/04/21 DOA: 01/23/2017  Referring physician: Dr Leonette Monarch PCP: Karleen Hampshire., MD   Chief Complaint: Drainage L armpit  HPI: Anne Robinson is a 26 y.o. female with hx of SLE, R axillary abscess Mar '18 who presented to ED with 2 wk hx of drainage from L axilla, and a 24- 48 hr hx of fevers, chills and N/V and SOB.  In ED temp was 99 then 103deg.  BP's good, RR 25-35 and HR 120- 130.  She doesn't feel good. Asked to see for admission.   Pt has a left axillary abscess that has been managed by her dermatologist with 10 days of doxycycline that was unsuccessfully treated.  Patient followed up with the dermatologist who changed her antibiotics to Bactrim.  However patient presented to the emergency department today due to feeling subjective fevers, feeling short of breath and generally fatigued.  Patient reports that the abscesses has been draining pus for days.   Hx SLE x 5 yrs, was on Cellcept but her "markers" recently were good , not taking anything now.  Has a rash on both LE's, been there about 1 weeks, she is not sure what it is.   Chart: Mar 2018 > sepsis due to R axillary abscess, rx IV abx for 5 d then po abx at dc for 5 more days. BCxs negative. Poss hidradenitis suppurativa.  F/u with surg OP setting. LFT's were up then normalized.  HIV was neg.    ROS  denies CP  no joint pain   no HA  no blurry vision  no rash  no diarrhea  no dysuria  no difficulty voiding  no change in urine color    Past Medical History  Past Medical History:  Diagnosis Date  . HTN (hypertension) 05/15/2016  . Lupus   . Lupus nephritis ([place])   . Renal disorder    Past Surgical History  Past Surgical History:  Procedure Laterality Date  . RENAL BIOPSY     Family History History reviewed. No pertinent family history. Social History  reports that  has never smoked. she has never used smokeless tobacco. She reports that  she does not drink alcohol or use drugs. Allergies  Allergies  Allergen Reactions  . Lisinopril Swelling   Home medications Prior to Admission medications   Medication Sig Start Date End Date Taking? Authorizing Provider  levonorgestrel-ethinyl estradiol (SEASONALE,INTROVALE,JOLESSA) 0.15-0.03 MG tablet Take 1 tablet by mouth daily.    [provider]  loratadine (CLARITIN) 10 MG tablet Take 10 mg by mouth daily as needed for allergies.     [provider]  Simethicone (GAS-X PO) Take 1 tablet by mouth daily as needed (for flatulence).     [provider]   Liver Function Tests Recent Labs  Lab 01/23/17 1935  AST 90*  ALT 38  ALKPHOS 44  BILITOT 0.6  PROT 8.9*  ALBUMIN 2.9*   No results for input(s): LIPASE, AMYLASE in the last 168 hours. CBC Recent Labs  Lab 01/23/17 1935  WBC 4.8  NEUTROABS 3.9  HGB 10.3*  HCT 31.4*  MCV 87.7  PLT 256   Basic Metabolic Panel Recent Labs  Lab 01/23/17 1935  NA 134*  K 2.8*  CL 109  CO2 21*  GLUCOSE 99  BUN 14  CREATININE 0.80  CALCIUM 8.2*     Vitals:   01/23/17 2200 01/23/17 2230 01/23/17 2231 01/23/17 2347  BP: 119/87 117/83  134/84  Pulse: (!) 121   (!) 132  Resp: 13 (!) 25  (!) 36  Temp:   99.8 F (37.7 C) (!) 103.1 F (39.5 C)  TempSrc:   Oral Oral  SpO2: 97%   100%  Weight:    117.5 kg (259 lb 0.7 oz)  Height:    4\' 11"  (1.499 m)   Exam: Gen tired appearing AAF, obese, not in distress, RR 30, no ^wob, temp 103 deg F Rash bilat LE's, patchy macular rash Sclera anicteric, throat clear and moist  No jvd or bruits Chest clear bilat RRR no MRG tachy Abd soft ntnd no mass or ascites +bs  GU defer MS no joint effusions or deformity L axilla w/ tender erythematous nodular / indurated areas, no active drainage or fluctuance R axilla wnl Ext no LE edema / no wounds or ulcers Neuro is alert, Ox 3 , nf    Home meds: -diflucan 150 prn/ advil 800 tid prn/ claritin 10 qd prn/ bactrim  DS 800-160 1 bid   Na 134  K 2.8  BUN 14  Cr 0.8  CO2 21  Alb 2.9  WBC 5k  Hb 10    EKG (independ reviewed) > sinus tach, no acute changes CXR (independ reviewed) > mild vasc congestion and CM.    Assessment: 1. Fevers/ drainage L axilla - plan admit, IV vanc/ zosyn for cellulitis/ abscess likelihood.   2. SLE - not on any meds  3. LE rash - unclear cause, this is new for her 4. HTN - following BP's, no need for BP med at this time 5. SOB - pt tachypneic from acute infection/ fevers.  CXR and lungs are clear, no signs of CHF on exam.      Plan - as above       [place] D [facility] Pager [phone]   If 7PM-7AM, please contact night-coverage [email or web address] Password [email or web address] 01/24/2017, 12:53 AM

## 2017-01-24 NOTE — Progress Notes (Signed)
Spoke to Dr Florene Glen r/t <UOP; monitor closely

## 2017-01-24 NOTE — Progress Notes (Signed)
CRITICAL VALUE ALERT  Critical Value:  vanc trough  Date & Time Notied:  01/24/17  1010  Provider Notified: Dr Florene Glen   Orders Received/Actions taken: none at this time; vanc on hold per pharm

## 2017-01-24 NOTE — Progress Notes (Addendum)
Pharmacy Antibiotic Note  Anne Robinson is a 26 y.o. female with hx SLE and left axillae boil. She was treated with doxycycline x7 days then switched to bactrim by her PCP and her dermatologist added topical clindamycin + hibiclens with office visit on 11/19.  She presented to the ED on 11/19 with worsening of left axillary abscess. Broad abx with vancomycin and zosyn started on admission for cellulitis/abscess.  Today, 01/24/2017: - day #1 of vanc/zosyn - Tmax 103.1, wbc 4.7 - scr increased noticeably from 0.8 to 1.19 today.  Patient received one dose of toradol on 11/19 and combination of vancomycin and zosyn can be nephrotoxic. - vancomycin trough level drawn after two doses of 1gm q8h came back elevated at 22  Plan: - change vancomycin to 1gm IV q24h for est AUC 470 (goal 400-500).  Next dose due at 0200 on 11/21 - continue zosyn 3.375 gm IV q8h (infuse over 4 hrs) - f/u with scr in AM and urine output ________________________________________  Height: 4\' 11"  (149.9 cm) Weight: 260 lb 2.3 oz (118 kg) IBW/kg (Calculated) : 43.2  Temp (24hrs), Avg:100.3 F (37.9 C), Min:98.4 F (36.9 C), Max:103.1 F (39.5 C)  Recent Labs  Lab 01/23/17 1935 01/23/17 1940 01/23/17 2233 01/24/17 0448 01/24/17 0919  WBC 4.8  --   --  4.7  --   CREATININE 0.80  --   --  1.19*  --   LATICACIDVEN  --  2.26* 1.51  --   --   VANCOTROUGH  --   --   --   --  22*    Estimated Creatinine Clearance: 81.9 mL/min (A) (by C-G formula based on SCr of 1.19 mg/dL (H)).    Allergies  Allergen Reactions  . Lisinopril Swelling   Antimicrobials this admission:  11/19 vanc>> 11/19 zosyn>>  Dose adjustments this admission:  11/20 at 0919 VT = 22 (after two 1gm q8h doses, scr up from 0.8 to 1.19)  Microbiology results:  11/19 BCx x2:   Thank you for allowing pharmacy to be a part of this patient's care.  Dia Sitter P 01/24/2017 10:10 AM

## 2017-01-25 ENCOUNTER — Inpatient Hospital Stay (HOSPITAL_COMMUNITY): Payer: 59

## 2017-01-25 ENCOUNTER — Encounter (HOSPITAL_COMMUNITY): Payer: Self-pay

## 2017-01-25 DIAGNOSIS — L02412 Cutaneous abscess of left axilla: Secondary | ICD-10-CM

## 2017-01-25 DIAGNOSIS — N179 Acute kidney failure, unspecified: Secondary | ICD-10-CM

## 2017-01-25 LAB — BASIC METABOLIC PANEL
Anion gap: 4 — ABNORMAL LOW (ref 5–15)
BUN: 29 mg/dL — ABNORMAL HIGH (ref 6–20)
CHLORIDE: 113 mmol/L — AB (ref 101–111)
CO2: 19 mmol/L — ABNORMAL LOW (ref 22–32)
CREATININE: 3.32 mg/dL — AB (ref 0.44–1.00)
Calcium: 7.3 mg/dL — ABNORMAL LOW (ref 8.9–10.3)
GFR calc non Af Amer: 18 mL/min — ABNORMAL LOW (ref >60)
GFR, EST AFRICAN AMERICAN: 21 mL/min — AB (ref >60)
Glucose, Bld: 91 mg/dL (ref 65–99)
Potassium: 4.1 mmol/L (ref 3.5–5.1)
SODIUM: 136 mmol/L (ref 135–145)

## 2017-01-25 LAB — PROTEIN / CREATININE RATIO, URINE
CREATININE, URINE: 87.8 mg/dL
Protein Creatinine Ratio: 0.71 mg/mg{Cre} — ABNORMAL HIGH (ref 0.00–0.15)
Total Protein, Urine: 62 mg/dL

## 2017-01-25 LAB — HEPATIC FUNCTION PANEL
ALT: 35 U/L (ref 14–54)
AST: 68 U/L — ABNORMAL HIGH (ref 15–41)
Albumin: 2.2 g/dL — ABNORMAL LOW (ref 3.5–5.0)
Alkaline Phosphatase: 31 U/L — ABNORMAL LOW (ref 38–126)
Bilirubin, Direct: <0.1 mg/dL — ABNORMAL LOW (ref 0.1–0.5)
Total Bilirubin: 0.9 mg/dL (ref 0.3–1.2)
Total Protein: 7 g/dL (ref 6.5–8.1)

## 2017-01-25 LAB — CBC
HCT: 26.3 % — ABNORMAL LOW (ref 36.0–46.0)
HEMOGLOBIN: 8.7 g/dL — AB (ref 12.0–15.0)
MCH: 29 pg (ref 26.0–34.0)
MCHC: 33.1 g/dL (ref 30.0–36.0)
MCV: 87.7 fL (ref 78.0–100.0)
Platelets: 229 10*3/uL (ref 150–400)
RBC: 3 MIL/uL — AB (ref 3.87–5.11)
RDW: 14.6 % (ref 11.5–15.5)
WBC: 4.8 10*3/uL (ref 4.0–10.5)

## 2017-01-25 LAB — SODIUM, URINE, RANDOM: SODIUM UR: 65 mmol/L

## 2017-01-25 MED ORDER — DEXTROSE 5 % IV SOLN
2.0000 g | INTRAVENOUS | Status: DC
  Administered 2017-01-25 – 2017-01-27 (×3): 2 g via INTRAVENOUS
  Filled 2017-01-25 (×4): qty 2

## 2017-01-25 MED ORDER — DEXTROSE 5 % IV SOLN
1.0000 g | INTRAVENOUS | Status: DC
  Filled 2017-01-25: qty 10

## 2017-01-25 MED ORDER — DIPHENHYDRAMINE HCL 25 MG PO CAPS
25.0000 mg | ORAL_CAPSULE | ORAL | Status: DC | PRN
  Administered 2017-01-25: 25 mg via ORAL
  Filled 2017-01-25: qty 1

## 2017-01-25 MED ORDER — DOXYCYCLINE HYCLATE 100 MG PO TABS
100.0000 mg | ORAL_TABLET | Freq: Two times a day (BID) | ORAL | Status: DC
  Administered 2017-01-25 – 2017-01-28 (×6): 100 mg via ORAL
  Filled 2017-01-25 (×6): qty 1

## 2017-01-25 MED ORDER — DEXTROSE 5 % IV SOLN
2.0000 g | INTRAVENOUS | Status: DC
  Filled 2017-01-25: qty 2

## 2017-01-25 NOTE — Consult Note (Signed)
[facility] Consult Note  Anne Robinson 1990-06-16  [id number].    Requesting MD: Fayrene Helper MD Chief Complaint/Reason for Consult: Left axillary cellulitis  HPI:  Anne Robinson is a 26yo female [place] SLE, who was admitted to [facility] 11/19 with concern for left axillary abscess. Patient reports prior h/o right axillary abscess in 05/2016; she underwent bedside I&D and required IV antibiotics, but no surgical debridement. States that about 2-3 weeks ago she noticed an area in her left axilla that was painful and started draining purulent material. She completed a 10 day course of doxycycline but symptoms did not improve. She was seen by a dermatologist who changed her antibiotics to Bactrim and started her on some prescription soap. Patient states that before she was able to start these new prescriptions she started having fevers, chills, n/v, SOB, and fatigue so she decided to come to the ED. She was admitted for broad spectrum antibiotics. Ultrasound was performed yesterday and showed soft tissue edema/cellulitis, no abscess, 2 enlarged lymph nodes. Patient states that she is feeling much better since admission. She is still tender in the left axilla, but pain is much improved. Drainage has stopped. WBC now 4.8, blood cultures NGTD, afebrile, VSS. She is on vancomycin and zosyn.  Nonsmoker Works in call center for [employer]  ROS: Review of Systems  Constitutional: Positive for chills and fever.  HENT: Negative.   Eyes: Negative.   Respiratory: Positive for shortness of breath.   Cardiovascular: Negative.   Gastrointestinal: Positive for nausea and vomiting.  Genitourinary: Negative.   Musculoskeletal: Negative.   Skin:       Left axillary cellulitis  Neurological: Negative.     All systems reviewed and otherwise negative except for as above  History reviewed. No pertinent family history.  Past Medical History:  Diagnosis Date  . HTN (hypertension) 05/15/2016  .  Lupus   . Lupus nephritis ([place])   . Renal disorder     Past Surgical History:  Procedure Laterality Date  . RENAL BIOPSY      Social History:  reports that  has never smoked. she has never used smokeless tobacco. She reports that she does not drink alcohol or use drugs.  Allergies:  Allergies  Allergen Reactions  . Lisinopril Swelling    Medications Prior to Admission  Medication Sig Dispense Refill  . fluconazole (DIFLUCAN) 150 MG tablet Take 150 mg daily as needed by mouth (yeast infection).   0  . ibuprofen (ADVIL,MOTRIN) 800 MG tablet Take 800 mg every 8 (eight) hours as needed by mouth for headache, mild pain or moderate pain.   0  . loratadine (CLARITIN) 10 MG tablet Take 10 mg by mouth daily as needed for allergies.     Marland Kitchen sulfamethoxazole-trimethoprim (BACTRIM DS,SEPTRA DS) 800-160 MG tablet Take 1 tablet 2 (two) times daily by mouth.  0    Prior to Admission medications   Medication Sig Start Date End Date Taking? Authorizing Provider  fluconazole (DIFLUCAN) 150 MG tablet Take 150 mg daily as needed by mouth (yeast infection).  01/11/17  Yes [provider]  ibuprofen (ADVIL,MOTRIN) 800 MG tablet Take 800 mg every 8 (eight) hours as needed by mouth for headache, mild pain or moderate pain.  01/13/17  Yes [provider]  loratadine (CLARITIN) 10 MG tablet Take 10 mg by mouth daily as needed for allergies.    Yes [provider]  sulfamethoxazole-trimethoprim (BACTRIM DS,SEPTRA DS) 800-160 MG tablet Take 1 tablet 2 (two)  times daily by mouth. 01/18/17  Yes [provider]    Blood pressure 116/72, pulse 97, temperature 98.3 F (36.8 C), temperature source Oral, resp. rate 18, height '4\' 11"'$  (1.499 m), weight 257 lb 15 oz (117 kg), last menstrual period 12/19/2016, SpO2 100 %. Physical Exam: General: pleasant, WD/WN AA female who is laying in bed in NAD HEENT: head is normocephalic, atraumatic.  Sclera are noninjected.  Pupils equal and  round.  Ears and nose without any masses or lesions.  Mouth is pink and moist. Dentition fair Heart: regular, rate, and rhythm.  No obvious murmurs, gallops, or rubs noted.  Palpable pedal pulses bilaterally Lungs: CTAB, no wheezes, rhonchi, or rales noted.  Respiratory effort nonlabored Abd: soft, NT/ND, +BS, no masses, hernias, or organomegaly MS: all 4 extremities are symmetrical with no cyanosis, clubbing, or edema. Skin: warm and dry with no other masses, lesions, or rashes Psych: A&Ox3 with an appropriate affect. Neuro: cranial nerves grossly intact, extremity CSM intact bilaterally, normal speech LUE: left axilla indurated but no fluctuance, no erythema, 2cm linear area likely where abscess previous drained but is no longer open or draining, no purulent material expressed with palpation  Results for orders placed or performed during the hospital encounter of 01/23/17 (from the past 48 hour(s))  Comprehensive metabolic panel     Status: Abnormal   Collection Time: 01/23/17  7:35 PM  Result Value Ref Range   Sodium 134 (L) 135 - 145 mmol/L   Potassium 2.8 (L) 3.5 - 5.1 mmol/L   Chloride 109 101 - 111 mmol/L   CO2 21 (L) 22 - 32 mmol/L   Glucose, Bld 99 65 - 99 mg/dL   BUN 14 6 - 20 mg/dL   Creatinine, Ser 0.80 0.44 - 1.00 mg/dL   Calcium 8.2 (L) 8.9 - 10.3 mg/dL   Total Protein 8.9 (H) 6.5 - 8.1 g/dL   Albumin 2.9 (L) 3.5 - 5.0 g/dL   AST 90 (H) 15 - 41 U/L   ALT 38 14 - 54 U/L   Alkaline Phosphatase 44 38 - 126 U/L   Total Bilirubin 0.6 0.3 - 1.2 mg/dL   GFR calc non Af Amer >60 >60 mL/min   GFR calc Af Amer >60 >60 mL/min    Comment: (NOTE) The eGFR has been calculated using the CKD EPI equation. This calculation has not been validated in all clinical situations. eGFR's persistently <60 mL/min signify possible Chronic Kidney Disease.    Anion gap 4 (L) 5 - 15  CBC with Differential     Status: Abnormal   Collection Time: 01/23/17  7:35 PM  Result Value Ref Range   WBC  4.8 4.0 - 10.5 K/uL   RBC 3.58 (L) 3.87 - 5.11 MIL/uL   Hemoglobin 10.3 (L) 12.0 - 15.0 g/dL   HCT 31.4 (L) 36.0 - 46.0 %   MCV 87.7 78.0 - 100.0 fL   MCH 28.8 26.0 - 34.0 pg   MCHC 32.8 30.0 - 36.0 g/dL   RDW 13.6 11.5 - 15.5 %   Platelets 268 150 - 400 K/uL   Neutrophils Relative % 81 %   Neutro Abs 3.9 1.7 - 7.7 K/uL   Lymphocytes Relative 18 %   Lymphs Abs 0.9 0.7 - 4.0 K/uL   Monocytes Relative 1 %   Monocytes Absolute 0.1 0.1 - 1.0 K/uL   Eosinophils Relative 0 %   Eosinophils Absolute 0.0 0.0 - 0.7 K/uL   Basophils Relative 0 %   Basophils  Absolute 0.0 0.0 - 0.1 K/uL  Blood culture (routine x 2)     Status: None (Preliminary result)   Collection Time: 01/23/17  7:35 PM  Result Value Ref Range   Specimen Description BLOOD RIGHT ANTECUBITAL    Special Requests      BOTTLES DRAWN AEROBIC AND ANAEROBIC Blood Culture adequate volume   Culture      NO GROWTH < 12 HOURS Performed at [facility] [address] [place], [place] [address]    Report Status PENDING   I-Stat CG4 Lactic Acid, ED     Status: Abnormal   Collection Time: 01/23/17  7:40 PM  Result Value Ref Range   Lactic Acid, Venous 2.26 (HH) 0.5 - 1.9 mmol/L   Comment NOTIFIED PHYSICIAN   Blood culture (routine x 2)     Status: None (Preliminary result)   Collection Time: 01/23/17  7:49 PM  Result Value Ref Range   Specimen Description BLOOD BLOOD RIGHT HAND    Special Requests      IN PEDIATRIC BOTTLE Blood Culture results may not be optimal due to an inadequate volume of blood received in culture bottles   Culture      NO GROWTH < 12 HOURS Performed at [facility] [address] [place], [place] [address]    Report Status PENDING   I-Stat CG4 Lactic Acid, ED     Status: None   Collection Time: 01/23/17 10:33 PM  Result Value Ref Range   Lactic Acid, Venous 1.51 0.5 - 1.9 mmol/L  CBC     Status: Abnormal   Collection Time: 01/24/17  4:48 AM  Result Value Ref Range   WBC 4.7 4.0 - 10.5  K/uL   RBC 3.33 (L) 3.87 - 5.11 MIL/uL   Hemoglobin 9.5 (L) 12.0 - 15.0 g/dL   HCT 29.4 (L) 36.0 - 46.0 %   MCV 88.3 78.0 - 100.0 fL   MCH 28.5 26.0 - 34.0 pg   MCHC 32.3 30.0 - 36.0 g/dL   RDW 14.0 11.5 - 15.5 %   Platelets 247 150 - 400 K/uL  Comprehensive metabolic panel     Status: Abnormal   Collection Time: 01/24/17  4:48 AM  Result Value Ref Range   Sodium 139 135 - 145 mmol/L   Potassium 4.0 3.5 - 5.1 mmol/L   Chloride 113 (H) 101 - 111 mmol/L   CO2 20 (L) 22 - 32 mmol/L   Glucose, Bld 122 (H) 65 - 99 mg/dL   BUN 16 6 - 20 mg/dL   Creatinine, Ser 1.19 (H) 0.44 - 1.00 mg/dL   Calcium 7.6 (L) 8.9 - 10.3 mg/dL   Total Protein 7.3 6.5 - 8.1 g/dL   Albumin 2.3 (L) 3.5 - 5.0 g/dL   AST 74 (H) 15 - 41 U/L   ALT 33 14 - 54 U/L   Alkaline Phosphatase 37 (L) 38 - 126 U/L   Total Bilirubin 0.8 0.3 - 1.2 mg/dL   GFR calc non Af Amer >60 >60 mL/min   GFR calc Af Amer >60 >60 mL/min    Comment: (NOTE) The eGFR has been calculated using the CKD EPI equation. This calculation has not been validated in all clinical situations. eGFR's persistently <60 mL/min signify possible Chronic Kidney Disease.    Anion gap 6 5 - 15  Vancomycin, trough     Status: Abnormal   Collection Time: 01/24/17  9:19 AM  Result Value Ref Range   Vancomycin Tr  22 (HH) 15 - 20 ug/mL    Comment: CRITICAL RESULT CALLED TO, READ BACK BY AND VERIFIED WITH: Phineas Real RN [phone] [email or web address]   Brain natriuretic peptide     Status: Abnormal   Collection Time: 01/24/17  1:28 PM  Result Value Ref Range   B Natriuretic Peptide 133.3 (H) 0.0 - 100.0 pg/mL  Urinalysis, Routine w reflex microscopic     Status: Abnormal   Collection Time: 01/24/17  6:19 PM  Result Value Ref Range   Color, Urine YELLOW YELLOW   APPearance CLOUDY (A) CLEAR   Specific Gravity, Urine 1.013 1.005 - 1.030   pH 5.0 5.0 - 8.0   Glucose, UA NEGATIVE NEGATIVE mg/dL   Hgb urine dipstick MODERATE (A) NEGATIVE   Bilirubin Urine  NEGATIVE NEGATIVE   Ketones, ur NEGATIVE NEGATIVE mg/dL   Protein, ur 30 (A) NEGATIVE mg/dL   Nitrite NEGATIVE NEGATIVE   Leukocytes, UA NEGATIVE NEGATIVE   RBC / HPF 6-30 0 - 5 RBC/hpf   WBC, UA 6-30 0 - 5 WBC/hpf   Bacteria, UA RARE (A) NONE SEEN   Squamous Epithelial / LPF 6-30 (A) NONE SEEN   Budding Yeast PRESENT   Sedimentation rate     Status: Abnormal   Collection Time: 01/24/17  9:12 PM  Result Value Ref Range   Sed Rate 60 (H) 0 - 22 mm/hr  C-reactive protein     Status: Abnormal   Collection Time: 01/24/17  9:12 PM  Result Value Ref Range   CRP 10.8 (H) <1.0 mg/dL    Comment: Performed at [facility], [place] [address] [place], [place] [address]  CBC     Status: Abnormal   Collection Time: 01/25/17  4:25 AM  Result Value Ref Range   WBC 4.8 4.0 - 10.5 K/uL   RBC 3.00 (L) 3.87 - 5.11 MIL/uL   Hemoglobin 8.7 (L) 12.0 - 15.0 g/dL   HCT 26.3 (L) 36.0 - 46.0 %   MCV 87.7 78.0 - 100.0 fL   MCH 29.0 26.0 - 34.0 pg   MCHC 33.1 30.0 - 36.0 g/dL   RDW 14.6 11.5 - 15.5 %   Platelets 229 150 - 400 K/uL  Basic metabolic panel     Status: Abnormal   Collection Time: 01/25/17  4:25 AM  Result Value Ref Range   Sodium 136 135 - 145 mmol/L   Potassium 4.1 3.5 - 5.1 mmol/L   Chloride 113 (H) 101 - 111 mmol/L   CO2 19 (L) 22 - 32 mmol/L   Glucose, Bld 91 65 - 99 mg/dL   BUN 29 (H) 6 - 20 mg/dL   Creatinine, Ser 3.32 (H) 0.44 - 1.00 mg/dL    Comment: DELTA CHECK NOTED REPEATED TO VERIFY    Calcium 7.3 (L) 8.9 - 10.3 mg/dL   GFR calc non Af Amer 18 (L) >60 mL/min   GFR calc Af Amer 21 (L) >60 mL/min    Comment: (NOTE) The eGFR has been calculated using the CKD EPI equation. This calculation has not been validated in all clinical situations. eGFR's persistently <60 mL/min signify possible Chronic Kidney Disease.    Anion gap 4 (L) 5 - 15  Hepatic function panel     Status: Abnormal   Collection Time: 01/25/17  4:25 AM  Result Value Ref Range   Total Protein 7.0 6.5  - 8.1 g/dL   Albumin 2.2 (L) 3.5 - 5.0 g/dL   AST 68 (H) 15 - 41 U/L  ALT 35 14 - 54 U/L   Alkaline Phosphatase 31 (L) 38 - 126 U/L   Total Bilirubin 0.9 0.3 - 1.2 mg/dL   Bilirubin, Direct <0.1 (L) 0.1 - 0.5 mg/dL   Indirect Bilirubin NOT CALCULATED 0.3 - 0.9 mg/dL   Dg Chest 2 View  Result Date: 01/23/2017 CLINICAL DATA:  Dyspnea with tachycardia. EXAM: CHEST  2 VIEW COMPARISON:  None. FINDINGS: Slightly low lung volumes with pulmonary vascular congestion and borderline cardiomegaly. No aortic aneurysm. Slight obscuration of the costophrenic angles may be secondary to trace pleural effusions. No acute osseous abnormality. IMPRESSION: Mild pulmonary vascular congestion and borderline cardiomegaly. Obscuration of the costophrenic angles are suspicious for small pleural effusions. Electronically Signed   By: Ashley Royalty M.D.   On: 01/23/2017 20:12   [place] Chest Soft Tissue  Result Date: 01/24/2017 CLINICAL DATA:  Cellulitis. Fever. Possible abscess in the left axilla. EXAM: ULTRASOUND OF Left AXILLARY SOFT TISSUES TECHNIQUE: Ultrasound examination of the axillary soft tissues was performed in the area of clinical concern. COMPARISON:  None. FINDINGS: Ultrasound was performed in the inferior left axilla/ lateral chest wall in the area of clinical concern. There is diffuse soft tissue edema. Minimal superficial fluid was noted just deep to a wound, however no organized fluid collection was seen to indicate abscess. Two enlarged lymph nodes measure 4.4 x 1.9 x 2.4 cm and 4.4 x 1.5 x 2.8 cm, both with cortical thickening but still with visible fatty hila and likely reactive in etiology. IMPRESSION: Left axillary soft tissue edema consistent with cellulitis. No evidence of abscess. Electronically Signed   By: Logan Bores M.D.   On: 01/24/2017 19:49   Anti-infectives (From admission, onward)   Start     Dose/Rate Route Frequency Ordered Stop   01/25/17 0200  vancomycin (VANCOCIN) IVPB 1000 mg/200 mL  premix  Status:  Discontinued     1,000 mg 200 mL/hr over 60 Minutes Intravenous Every 24 hours 01/24/17 1228 01/25/17 0742   01/24/17 0400  piperacillin-tazobactam (ZOSYN) IVPB 3.375 g     3.375 g 12.5 mL/hr over 240 Minutes Intravenous Every 8 hours 01/23/17 2111     01/24/17 0200  vancomycin (VANCOCIN) IVPB 1000 mg/200 mL premix  Status:  Discontinued     1,000 mg 200 mL/hr over 60 Minutes Intravenous Every 8 hours 01/23/17 2111 01/24/17 0835   01/23/17 2045  piperacillin-tazobactam (ZOSYN) IVPB 3.375 g     3.375 g 100 mL/hr over 30 Minutes Intravenous  Once 01/23/17 2031 01/23/17 2140   01/23/17 2045  vancomycin (VANCOCIN) IVPB 1000 mg/200 mL premix     1,000 mg 200 mL/hr over 60 Minutes Intravenous  Once 01/23/17 2031 01/23/17 2155        Assessment/Plan Lupus H/o right axillary abscess 05/2016  Left axillary cellulitis - likely had an abscess that has successfully drained - u/s 11/20 shows soft tissue edema/cellulitis, no abscess, 2 enlarged lymph nodes - WBC now 4.8, blood cultures NGTD, afebrile, VSS  ID - zosyn 11/19>>, vancomycin 11/19>> VTE - SCDs, lovenox FEN - IVF, clear liquids Foley - none Follow up - dermatology  Plan - Patient likely had a left axillary abscess that successfully drained. U/s shows cellulitis, no abscess. She has no fluctuance on exam. Would recommend continuing antibiotics for cellulitis. She does not need any surgical debridement at this time. Recommend following up with dermatologist if this is secondary to hidradenitis.  Wellington Hampshire, [facility] 01/25/2017, 9:27 AM Pager: [phone] Consults: [phone] Mon-Fri  7:00 am-4:30 pm Sat-Sun 7:00 am-11:30 am

## 2017-01-25 NOTE — Progress Notes (Signed)
PROGRESS NOTE  NDIDI NESBY  STM:[id number] DOB: 1990-06-27 DOA: 01/23/2017 PCP: Karleen Hampshire., MD  Outpatient Specialists: Rheumatology, Dr. Gerilyn Nestle Brief Narrative: Evadean Sproule Scottis a 26 y.o.femalewith hx of SLE, R axillary abscess Mar '18 who presented to ED with 2 wk hx of drainage from L axilla, and a 24- 48 hr hx of fevers, chills and N/V and SOB. In ED temp was 99 then 103deg. BP's good, RR 25-35 and HR 120- 130. She doesn't feel good. Asked to see for admission.   Pt has a left axillary abscess that has been managed by her dermatologist with 10 days of doxycycline that was unsuccessfully treated. Patient followed up with the dermatologist who changed her antibiotics to Bactrim. However patient presented to the emergency department today due to feeling subjective fevers, feeling short of breath and generally fatigued. Patient reports that the abscesseshas been draining pus for days.   Hx SLE x 5 yrs, was on Cellcept but her "markers" recently were good , not taking anything now. Has a rash on both LE's, been there about 1 weeks, she is not sure what it is.   Assessment & Plan: Principal Problem:   Abscess of axilla, left Active Problems:   Sepsis ([place])   HTN (hypertension)   SLE (systemic lupus erythematosus) (HCC)   Tachypnea   Dyspnea   Macular rash   Abscess of left axilla  Left axilla abscess and cellulitis: Spontaneously ruptures with no residual abscess on exam/U/S.  - Continue antibiotics. Stopped vancomycin due to concern for nephrotoxicity. Will continue ceftriaxone/doxycycline given concern for purulent infection. - Has been evaluated by general surgery, no indication for I&D or debridement. - Monitor cultures.   Acute renal failure: With supratherapeutic vancomycin, NSAID use, and infection. Pr:Cr checked, 0.7. Renal U/S without hydronephrosis.  - Monitor UOP, BMP in AM.  - Consulted nephrology, no indication for hemodialysis at this time.  - Hold  vancomycin, avoiding nephrotoxins.   SLE and history of lupus nephritis: No longer on cellcept, plaquenil, prednisone. Followed by Dr. Gerilyn Nestle every 6 months since inflammatory markers have improved. ESR, CRP elevated, though not very helpful in setting of infection.  - Concern with proteinuria and renal failure for nephritis flare as above. No other indications of synovitis/serositis.  - Complement, dsDNA pending.   Edema/rash: Diffusely, possibly related to SLE.  - Continue to monitor. If this and renal failure fail to improve, would consider empiric steroid.   Dyspnea: Possibly related to volume overload, borderline cardiomegaly on CSR, vascular congestion.  - Check echocardiogram  DVT prophylaxis: Lovenox Code Status: Full Family Communication: None at bedside Disposition Plan: DC home once improving.  Consultants:   Nephrology  Procedures:   None  Antimicrobials:  Vancomycin/zosyn 11/20 - 11/21  Ceftriaxone, doxycycline 11/21 >>    Subjective: Swelling in hands has worsened overnight, causing pain which was improved with dilaudid but now having diffuse itching. No chest pain. Minimal UOP so far today.   Objective: Vitals:   01/24/17 0549 01/24/17 1400 01/25/17 0300 01/25/17 1432  BP: 124/81 132/83 116/72 (!) 140/91  Pulse: (!) 109 96 97 83  Resp: (!) '24  18 19  ' Temp: 98.4 F (36.9 C) 98.4 F (36.9 C) 98.3 F (36.8 C) 97.9 F (36.6 C)  TempSrc: Oral Oral Oral Oral  SpO2: 100% 99% 100% 100%  Weight:   117 kg (257 lb 15 oz)   Height:        Intake/Output Summary (Last 24 hours) at 01/25/2017 1514 Last  data filed at 01/25/2017 1005 Gross per 24 hour  Intake 3155 ml  Output 900 ml  Net 2255 ml   Filed Weights   01/23/17 2347 01/24/17 0500 01/25/17 0300  Weight: 117.5 kg (259 lb 0.7 oz) 118 kg (260 lb 2.3 oz) 117 kg (257 lb 15 oz)    Gen: Obese 26yo female in no distress  Pulm: Non-labored breathing room air. Clear to auscultation bilaterally.  CV:  Regular rate and rhythm. No murmur, rub, or gallop. No JVD GI: Abdomen soft, non-tender, non-distended, with normoactive bowel sounds. No organomegaly or masses felt. Ext: Diffusely edematous upper and lower extremities Skin: Left axilla with focal tender induration without fluctuance.  Neuro: Alert and oriented. No focal neurological deficits. Psych: Judgement and insight appear normal. Mood & affect appropriate.   Data Reviewed: I have personally reviewed following labs and imaging studies  CBC: Recent Labs  Lab 01/23/17 1935 01/24/17 0448 01/25/17 0425  WBC 4.8 4.7 4.8  NEUTROABS 3.9  --   --   HGB 10.3* 9.5* 8.7*  HCT 31.4* 29.4* 26.3*  MCV 87.7 88.3 87.7  PLT 268 247 032   Basic Metabolic Panel: Recent Labs  Lab 01/23/17 1935 01/24/17 0448 01/25/17 0425  NA 134* 139 136  K 2.8* 4.0 4.1  CL 109 113* 113*  CO2 21* 20* 19*  GLUCOSE 99 122* 91  BUN 14 16 29*  CREATININE 0.80 1.19* 3.32*  CALCIUM 8.2* 7.6* 7.3*   GFR: Estimated Creatinine Clearance: 29.2 mL/min (A) (by C-G formula based on SCr of 3.32 mg/dL (H)). Liver Function Tests: Recent Labs  Lab 01/23/17 1935 01/24/17 0448 01/25/17 0425  AST 90* 74* 68*  ALT 38 33 35  ALKPHOS 44 37* 31*  BILITOT 0.6 0.8 0.9  PROT 8.9* 7.3 7.0  ALBUMIN 2.9* 2.3* 2.2*   No results for input(s): LIPASE, AMYLASE in the last 168 hours. No results for input(s): AMMONIA in the last 168 hours. Coagulation Profile: No results for input(s): INR, PROTIME in the last 168 hours. Cardiac Enzymes: No results for input(s): CKTOTAL, CKMB, CKMBINDEX, TROPONINI in the last 168 hours. BNP (last 3 results) No results for input(s): PROBNP in the last 8760 hours. HbA1C: No results for input(s): HGBA1C in the last 72 hours. CBG: No results for input(s): GLUCAP in the last 168 hours. Lipid Profile: No results for input(s): CHOL, HDL, LDLCALC, TRIG, CHOLHDL, LDLDIRECT in the last 72 hours. Thyroid Function Tests: No results for  input(s): TSH, T4TOTAL, FREET4, T3FREE, THYROIDAB in the last 72 hours. Anemia Panel: No results for input(s): VITAMINB12, FOLATE, FERRITIN, TIBC, IRON, RETICCTPCT in the last 72 hours. Urine analysis:    Component Value Date/Time   COLORURINE YELLOW 01/24/2017 1819   APPEARANCEUR CLOUDY (A) 01/24/2017 1819   LABSPEC 1.013 01/24/2017 1819   PHURINE 5.0 01/24/2017 1819   GLUCOSEU NEGATIVE 01/24/2017 1819   HGBUR MODERATE (A) 01/24/2017 1819   BILIRUBINUR NEGATIVE 01/24/2017 1819   KETONESUR NEGATIVE 01/24/2017 1819   PROTEINUR 30 (A) 01/24/2017 1819   UROBILINOGEN 1.0 10/09/2011 1941   NITRITE NEGATIVE 01/24/2017 1819   LEUKOCYTESUR NEGATIVE 01/24/2017 1819   Recent Results (from the past 240 hour(s))  Blood culture (routine x 2)     Status: None (Preliminary result)   Collection Time: 01/23/17  7:35 PM  Result Value Ref Range Status   Specimen Description BLOOD RIGHT ANTECUBITAL  Final   Special Requests   Final    BOTTLES DRAWN AEROBIC AND ANAEROBIC Blood Culture adequate volume  Culture   Final    NO GROWTH < 12 HOURS Performed at [facility], [place] [address] [place], [place] [address]    Report Status PENDING  Incomplete  Blood culture (routine x 2)     Status: None (Preliminary result)   Collection Time: 01/23/17  7:49 PM  Result Value Ref Range Status   Specimen Description BLOOD BLOOD RIGHT HAND  Final   Special Requests   Final    IN PEDIATRIC BOTTLE Blood Culture results may not be optimal due to an inadequate volume of blood received in culture bottles   Culture   Final    NO GROWTH < 12 HOURS Performed at [facility], [place] [address] [place], [place] [address]    Report Status PENDING  Incomplete      Radiology Studies: Dg Chest 2 View  Result Date: 01/23/2017 CLINICAL DATA:  Dyspnea with tachycardia. EXAM: CHEST  2 VIEW COMPARISON:  None. FINDINGS: Slightly low lung volumes with pulmonary vascular congestion and borderline cardiomegaly. No  aortic aneurysm. Slight obscuration of the costophrenic angles may be secondary to trace pleural effusions. No acute osseous abnormality. IMPRESSION: Mild pulmonary vascular congestion and borderline cardiomegaly. Obscuration of the costophrenic angles are suspicious for small pleural effusions. Electronically Signed   By: Ashley Royalty M.D.   On: 01/23/2017 20:12   US Renal  Result Date: 01/25/2017 CLINICAL DATA:  Acute renal failure EXAM: RENAL / URINARY TRACT ULTRASOUND COMPLETE COMPARISON:  None. FINDINGS: Right Kidney: Length: 13.5 cm. Increased echotexture throughout the left kidney. No hydronephrosis or mass. Left Kidney: Length: 14.1 cm. Increased echotexture throughout the left kidney. No hydronephrosis or mass. Bladder: Appears normal for degree of bladder distention. IMPRESSION: Increased echotexture within the kidneys bilaterally. No hydronephrosis. Electronically Signed   By: Rolm Baptise M.D.   On: 01/25/2017 09:36   [place] Chest Soft Tissue  Result Date: 01/24/2017 CLINICAL DATA:  Cellulitis. Fever. Possible abscess in the left axilla. EXAM: ULTRASOUND OF Left AXILLARY SOFT TISSUES TECHNIQUE: Ultrasound examination of the axillary soft tissues was performed in the area of clinical concern. COMPARISON:  None. FINDINGS: Ultrasound was performed in the inferior left axilla/ lateral chest wall in the area of clinical concern. There is diffuse soft tissue edema. Minimal superficial fluid was noted just deep to a wound, however no organized fluid collection was seen to indicate abscess. Two enlarged lymph nodes measure 4.4 x 1.9 x 2.4 cm and 4.4 x 1.5 x 2.8 cm, both with cortical thickening but still with visible fatty hila and likely reactive in etiology. IMPRESSION: Left axillary soft tissue edema consistent with cellulitis. No evidence of abscess. Electronically Signed   By: Logan Bores M.D.   On: 01/24/2017 19:49    Scheduled Meds: . enoxaparin (LOVENOX) injection  40 mg Subcutaneous Q24H    Continuous Infusions: . piperacillin-tazobactam (ZOSYN)  IV 3.375 g (01/25/17 1330)     LOS: 2 days   Time spent: 25 minutes.  Vance Gather, MD [facility] Pager [phone]  If 7PM-7AM, please contact night-coverage [email or web address] Password [facility] 01/25/2017, 3:14 PM

## 2017-01-25 NOTE — Consult Note (Signed)
[facility] Renal Consultation Note  Requesting MD: Bonner Puna Indication for Consultation: AKI  HPI:  Anne Robinson is a 26 y.o. female with past medical history significant for lupus and lupus nephritis followed by [facility] nephrology Dr. Dimas Aguas in [facility] but hasnt been there in a year (their office is closed for the holidays) .  Said had been on steroids and cellcept in the past for proteinuria but had weaned off maybe over a year ago and seemed to be in remission.  She also sees a Dr. Gerilyn Nestle a rheumatologist.  She was doing well until she failed outpatient management of a left axillary abscess.  She was admitted and started on IV antibiotics, Zosyn and vancomycin. Her presenting creatinine was 0.8. It was 1.19 the next day, then worsened to 3.32 today and that is the reason for consult. She had been receiving ibuprofen and Toradol and also had a vancomycin trough of 22.  Urine output recorded yesterday was 650, and 250 today. She's not had any significantly low blood pressures. The NSAIDs have been stopped and vancomycin stopped as well. Her only other complaint is that she swollen, and she is 5 L positive since admission. Interestingly, most recent urinalysis only showed 30 of protein - protein to creatinine ratio 0.7  which was an improvement from ones in the past. She also does not have urinary cellularity.  Lupus markers are pending  Creatinine, Ser  Date/Time Value Ref Range Status  01/25/2017 04:25 AM 3.32 (H) 0.44 - 1.00 mg/dL Final    Comment:    DELTA CHECK NOTED REPEATED TO VERIFY   01/24/2017 04:48 AM 1.19 (H) 0.44 - 1.00 mg/dL Final  01/23/2017 07:35 PM 0.80 0.44 - 1.00 mg/dL Final  05/18/2016 12:29 PM 0.85 0.44 - 1.00 mg/dL Final  05/17/2016 01:59 AM 0.98 0.44 - 1.00 mg/dL Final  05/16/2016 12:10 AM 1.43 (H) 0.44 - 1.00 mg/dL Final  05/15/2016 04:44 PM 1.33 (H) 0.44 - 1.00 mg/dL Final  07/03/2013 12:28 PM 0.60 0.50 - 1.10 mg/dL Final  10/09/2011 08:20 PM 0.70  0.50 - 1.10 mg/dL Final  10/06/2011 02:58 PM 0.80 0.50 - 1.10 mg/dL Final  09/04/2011 11:45 PM 0.80 0.50 - 1.10 mg/dL Final  12/02/2010 03:16 PM 0.60 0.50 - 1.10 mg/dL Final     PMHx:   Past Medical History:  Diagnosis Date  . HTN (hypertension) 05/15/2016  . Lupus   . Lupus nephritis ([place])   . Renal disorder     Past Surgical History:  Procedure Laterality Date  . RENAL BIOPSY      Family Hx: History reviewed. No pertinent family history.  Social History:  reports that  has never smoked. she has never used smokeless tobacco. She reports that she does not drink alcohol or use drugs.  Allergies:  Allergies  Allergen Reactions  . Lisinopril Swelling    Medications: Prior to Admission medications   Medication Sig Start Date End Date Taking? Authorizing Provider  fluconazole (DIFLUCAN) 150 MG tablet Take 150 mg daily as needed by mouth (yeast infection).  01/11/17  Yes [provider]  ibuprofen (ADVIL,MOTRIN) 800 MG tablet Take 800 mg every 8 (eight) hours as needed by mouth for headache, mild pain or moderate pain.  01/13/17  Yes [provider]  loratadine (CLARITIN) 10 MG tablet Take 10 mg by mouth daily as needed for allergies.    Yes [provider]  sulfamethoxazole-trimethoprim (BACTRIM DS,SEPTRA DS) 800-160 MG tablet Take 1 tablet 2 (two) times daily by  mouth. 01/18/17  Yes [provider]    I have reviewed the patient's current medications.  Labs:  Results for orders placed or performed during the hospital encounter of 01/23/17 (from the past 48 hour(s))  Comprehensive metabolic panel     Status: Abnormal   Collection Time: 01/23/17  7:35 PM  Result Value Ref Range   Sodium 134 (L) 135 - 145 mmol/L   Potassium 2.8 (L) 3.5 - 5.1 mmol/L   Chloride 109 101 - 111 mmol/L   CO2 21 (L) 22 - 32 mmol/L   Glucose, Bld 99 65 - 99 mg/dL   BUN 14 6 - 20 mg/dL   Creatinine, Ser 0.80 0.44 - 1.00 mg/dL   Calcium 8.2 (L) 8.9 - 10.3 mg/dL    Total Protein 8.9 (H) 6.5 - 8.1 g/dL   Albumin 2.9 (L) 3.5 - 5.0 g/dL   AST 90 (H) 15 - 41 U/L   ALT 38 14 - 54 U/L   Alkaline Phosphatase 44 38 - 126 U/L   Total Bilirubin 0.6 0.3 - 1.2 mg/dL   GFR calc non Af Amer >60 >60 mL/min   GFR calc Af Amer >60 >60 mL/min    Comment: (NOTE) The eGFR has been calculated using the CKD EPI equation. This calculation has not been validated in all clinical situations. eGFR's persistently <60 mL/min signify possible Chronic Kidney Disease.    Anion gap 4 (L) 5 - 15  CBC with Differential     Status: Abnormal   Collection Time: 01/23/17  7:35 PM  Result Value Ref Range   WBC 4.8 4.0 - 10.5 K/uL   RBC 3.58 (L) 3.87 - 5.11 MIL/uL   Hemoglobin 10.3 (L) 12.0 - 15.0 g/dL   HCT 31.4 (L) 36.0 - 46.0 %   MCV 87.7 78.0 - 100.0 fL   MCH 28.8 26.0 - 34.0 pg   MCHC 32.8 30.0 - 36.0 g/dL   RDW 13.6 11.5 - 15.5 %   Platelets 268 150 - 400 K/uL   Neutrophils Relative % 81 %   Neutro Abs 3.9 1.7 - 7.7 K/uL   Lymphocytes Relative 18 %   Lymphs Abs 0.9 0.7 - 4.0 K/uL   Monocytes Relative 1 %   Monocytes Absolute 0.1 0.1 - 1.0 K/uL   Eosinophils Relative 0 %   Eosinophils Absolute 0.0 0.0 - 0.7 K/uL   Basophils Relative 0 %   Basophils Absolute 0.0 0.0 - 0.1 K/uL  Blood culture (routine x 2)     Status: None (Preliminary result)   Collection Time: 01/23/17  7:35 PM  Result Value Ref Range   Specimen Description BLOOD RIGHT ANTECUBITAL    Special Requests      BOTTLES DRAWN AEROBIC AND ANAEROBIC Blood Culture adequate volume   Culture      NO GROWTH < 12 HOURS Performed at [facility], [address], [place], [place] [address]    Report Status PENDING   I-Stat CG4 Lactic Acid, ED     Status: Abnormal   Collection Time: 01/23/17  7:40 PM  Result Value Ref Range   Lactic Acid, Venous 2.26 (HH) 0.5 - 1.9 mmol/L   Comment NOTIFIED PHYSICIAN   Blood culture (routine x 2)     Status: None (Preliminary result)   Collection Time: 01/23/17  7:49 PM   Result Value Ref Range   Specimen Description BLOOD BLOOD RIGHT HAND    Special Requests      IN PEDIATRIC BOTTLE Blood  Culture results may not be optimal due to an inadequate volume of blood received in culture bottles   Culture      NO GROWTH < 12 HOURS Performed at [facility] [address] [place], [place] [address]    Report Status PENDING   I-Stat CG4 Lactic Acid, ED     Status: None   Collection Time: 01/23/17 10:33 PM  Result Value Ref Range   Lactic Acid, Venous 1.51 0.5 - 1.9 mmol/L  CBC     Status: Abnormal   Collection Time: 01/24/17  4:48 AM  Result Value Ref Range   WBC 4.7 4.0 - 10.5 K/uL   RBC 3.33 (L) 3.87 - 5.11 MIL/uL   Hemoglobin 9.5 (L) 12.0 - 15.0 g/dL   HCT 29.4 (L) 36.0 - 46.0 %   MCV 88.3 78.0 - 100.0 fL   MCH 28.5 26.0 - 34.0 pg   MCHC 32.3 30.0 - 36.0 g/dL   RDW 14.0 11.5 - 15.5 %   Platelets 247 150 - 400 K/uL  Comprehensive metabolic panel     Status: Abnormal   Collection Time: 01/24/17  4:48 AM  Result Value Ref Range   Sodium 139 135 - 145 mmol/L   Potassium 4.0 3.5 - 5.1 mmol/L   Chloride 113 (H) 101 - 111 mmol/L   CO2 20 (L) 22 - 32 mmol/L   Glucose, Bld 122 (H) 65 - 99 mg/dL   BUN 16 6 - 20 mg/dL   Creatinine, Ser 1.19 (H) 0.44 - 1.00 mg/dL   Calcium 7.6 (L) 8.9 - 10.3 mg/dL   Total Protein 7.3 6.5 - 8.1 g/dL   Albumin 2.3 (L) 3.5 - 5.0 g/dL   AST 74 (H) 15 - 41 U/L   ALT 33 14 - 54 U/L   Alkaline Phosphatase 37 (L) 38 - 126 U/L   Total Bilirubin 0.8 0.3 - 1.2 mg/dL   GFR calc non Af Amer >60 >60 mL/min   GFR calc Af Amer >60 >60 mL/min    Comment: (NOTE) The eGFR has been calculated using the CKD EPI equation. This calculation has not been validated in all clinical situations. eGFR's persistently <60 mL/min signify possible Chronic Kidney Disease.    Anion gap 6 5 - 15  Vancomycin, trough     Status: Abnormal   Collection Time: 01/24/17  9:19 AM  Result Value Ref Range   Vancomycin Tr 22 (HH) 15 - 20 ug/mL     Comment: CRITICAL RESULT CALLED TO, READ BACK BY AND VERIFIED WITH: Phineas Real RN [id number] [email or web address]   Brain natriuretic peptide     Status: Abnormal   Collection Time: 01/24/17  1:28 PM  Result Value Ref Range   B Natriuretic Peptide 133.3 (H) 0.0 - 100.0 pg/mL  Urinalysis, Routine w reflex microscopic     Status: Abnormal   Collection Time: 01/24/17  6:19 PM  Result Value Ref Range   Color, Urine YELLOW YELLOW   APPearance CLOUDY (A) CLEAR   Specific Gravity, Urine 1.013 1.005 - 1.030   pH 5.0 5.0 - 8.0   Glucose, UA NEGATIVE NEGATIVE mg/dL   Hgb urine dipstick MODERATE (A) NEGATIVE   Bilirubin Urine NEGATIVE NEGATIVE   Ketones, ur NEGATIVE NEGATIVE mg/dL   Protein, ur 30 (A) NEGATIVE mg/dL   Nitrite NEGATIVE NEGATIVE   Leukocytes, UA NEGATIVE NEGATIVE   RBC / HPF 6-30 0 - 5 RBC/hpf   WBC, UA 6-30 0 - 5 WBC/hpf   Bacteria,  UA RARE (A) NONE SEEN   Squamous Epithelial / LPF 6-30 (A) NONE SEEN   Budding Yeast PRESENT   Sedimentation rate     Status: Abnormal   Collection Time: 01/24/17  9:12 PM  Result Value Ref Range   Sed Rate 60 (H) 0 - 22 mm/hr  C-reactive protein     Status: Abnormal   Collection Time: 01/24/17  9:12 PM  Result Value Ref Range   CRP 10.8 (H) <1.0 mg/dL    Comment: Performed at [facility] [address], [place], [place] [address]  CBC     Status: Abnormal   Collection Time: 01/25/17  4:25 AM  Result Value Ref Range   WBC 4.8 4.0 - 10.5 K/uL   RBC 3.00 (L) 3.87 - 5.11 MIL/uL   Hemoglobin 8.7 (L) 12.0 - 15.0 g/dL   HCT 26.3 (L) 36.0 - 46.0 %   MCV 87.7 78.0 - 100.0 fL   MCH 29.0 26.0 - 34.0 pg   MCHC 33.1 30.0 - 36.0 g/dL   RDW 14.6 11.5 - 15.5 %   Platelets 229 150 - 400 K/uL  Basic metabolic panel     Status: Abnormal   Collection Time: 01/25/17  4:25 AM  Result Value Ref Range   Sodium 136 135 - 145 mmol/L   Potassium 4.1 3.5 - 5.1 mmol/L   Chloride 113 (H) 101 - 111 mmol/L   CO2 19 (L) 22 - 32 mmol/L   Glucose, Bld 91 65 -  99 mg/dL   BUN 29 (H) 6 - 20 mg/dL   Creatinine, Ser 3.32 (H) 0.44 - 1.00 mg/dL    Comment: DELTA CHECK NOTED REPEATED TO VERIFY    Calcium 7.3 (L) 8.9 - 10.3 mg/dL   GFR calc non Af Amer 18 (L) >60 mL/min   GFR calc Af Amer 21 (L) >60 mL/min    Comment: (NOTE) The eGFR has been calculated using the CKD EPI equation. This calculation has not been validated in all clinical situations. eGFR's persistently <60 mL/min signify possible Chronic Kidney Disease.    Anion gap 4 (L) 5 - 15  Hepatic function panel     Status: Abnormal   Collection Time: 01/25/17  4:25 AM  Result Value Ref Range   Total Protein 7.0 6.5 - 8.1 g/dL   Albumin 2.2 (L) 3.5 - 5.0 g/dL   AST 68 (H) 15 - 41 U/L   ALT 35 14 - 54 U/L   Alkaline Phosphatase 31 (L) 38 - 126 U/L   Total Bilirubin 0.9 0.3 - 1.2 mg/dL   Bilirubin, Direct <0.1 (L) 0.1 - 0.5 mg/dL   Indirect Bilirubin NOT CALCULATED 0.3 - 0.9 mg/dL  Protein / creatinine ratio, urine     Status: Abnormal   Collection Time: 01/25/17 10:13 AM  Result Value Ref Range   Creatinine, Urine 87.80 mg/dL   Total Protein, Urine 62 mg/dL    Comment: NO NORMAL RANGE ESTABLISHED FOR THIS TEST   Protein Creatinine Ratio 0.71 (H) 0.00 - 0.15 mg/mg[Cre]    Comment: Performed at [facility], [place] [address] [place], [place] [address]  Sodium, urine, random     Status: None   Collection Time: 01/25/17 10:13 AM  Result Value Ref Range   Sodium, Ur 65 mmol/L    Comment: Performed at [facility] [address], [place], [place] [address]     ROS:  A comprehensive review of systems was negative except for: Cardiovascular:  positive for Left axillary pain Musculoskeletal: positive for Edema  Physical Exam: Vitals:   01/24/17 1400 01/25/17 0300  BP: 132/83 116/72  Pulse: 96 97  Resp:  18  Temp: 98.4 F (36.9 C) 98.3 F (36.8 C)  SpO2: 99% 100%     General: Pleasant, obese black female alert, no acute distress HEENT: Pupils are equally round and  reactive to light, extra motions are intact, mucous members are moist Neck: Possible JVD Heart: Tachycardic Lungs: Mostly clear Abdomen: Obese, nontender Extremities: Edema 2 upper extremities and hands but no synovitis. Distal lower extremity edema as well Skin: Warm and dry Neuro: Alert, nonfocal  Assessment/Plan: 26 year old black female with 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 Now with AKI in the setting of an axillary abscess with antibiotic and NSAID therapy 1.Renal- AKI. Differential would include an infection related acute kidney injury, analgesic nephropathy, supra therapeutic vancomycin level versus flare of lupus nephritis. Given the proteinuria is actually improved from the past and that she has no hematuria I am less suspicious of an acute lupus nephritis flare.  Lupus parameters are pending- sedimentation rate and CRP are elevated but they would be elevated in the setting of infection. Offending medications have been stopped, and infection appears to be improving. If AKI due to these things I would suspect it will improve and hope that that is the case 2. Hypertension/volume  - I think her tank is full. I will stop IV fluids 3. Axillary abscess  - has improved with antibiotic therapy. Antibiotics now trimmed to Zosyn only   Cathern Tahir A 01/25/2017, 1:55 PM

## 2017-01-25 NOTE — Progress Notes (Signed)
Notified MD of increased bilateral hand swelling with limited movement and pain. Patient stated that since her admission this has gotten worse. Also decrease in urine output since her admission. More labs were drawn as well as heat applied to hands. Will continue to monitor closely.

## 2017-01-26 ENCOUNTER — Inpatient Hospital Stay (HOSPITAL_COMMUNITY): Payer: 59

## 2017-01-26 DIAGNOSIS — N179 Acute kidney failure, unspecified: Secondary | ICD-10-CM | POA: Diagnosis not present

## 2017-01-26 DIAGNOSIS — R0609 Other forms of dyspnea: Secondary | ICD-10-CM

## 2017-01-26 DIAGNOSIS — A419 Sepsis, unspecified organism: Principal | ICD-10-CM

## 2017-01-26 DIAGNOSIS — L03313 Cellulitis of chest wall: Secondary | ICD-10-CM

## 2017-01-26 DIAGNOSIS — M329 Systemic lupus erythematosus, unspecified: Secondary | ICD-10-CM

## 2017-01-26 DIAGNOSIS — I10 Essential (primary) hypertension: Secondary | ICD-10-CM

## 2017-01-26 DIAGNOSIS — R231 Pallor: Secondary | ICD-10-CM

## 2017-01-26 DIAGNOSIS — I5030 Unspecified diastolic (congestive) heart failure: Secondary | ICD-10-CM

## 2017-01-26 LAB — URINALYSIS, ROUTINE W REFLEX MICROSCOPIC
Bilirubin Urine: NEGATIVE
GLUCOSE, UA: NEGATIVE mg/dL
KETONES UR: NEGATIVE mg/dL
LEUKOCYTES UA: NEGATIVE
Nitrite: NEGATIVE
PROTEIN: NEGATIVE mg/dL
Specific Gravity, Urine: 1.008 (ref 1.005–1.030)
pH: 6 (ref 5.0–8.0)

## 2017-01-26 LAB — ECHOCARDIOGRAM COMPLETE
Height: 59 in
Weight: 4194.03 oz

## 2017-01-26 LAB — RENAL FUNCTION PANEL
ANION GAP: 7 (ref 5–15)
Albumin: 2.2 g/dL — ABNORMAL LOW (ref 3.5–5.0)
BUN: 35 mg/dL — ABNORMAL HIGH (ref 6–20)
CALCIUM: 7.8 mg/dL — AB (ref 8.9–10.3)
CO2: 18 mmol/L — AB (ref 22–32)
Chloride: 112 mmol/L — ABNORMAL HIGH (ref 101–111)
Creatinine, Ser: 4.38 mg/dL — ABNORMAL HIGH (ref 0.44–1.00)
GFR calc non Af Amer: 13 mL/min — ABNORMAL LOW (ref >60)
GFR, EST AFRICAN AMERICAN: 15 mL/min — AB (ref >60)
Glucose, Bld: 88 mg/dL (ref 65–99)
PHOSPHORUS: 5.5 mg/dL — AB (ref 2.5–4.6)
Potassium: 3.9 mmol/L (ref 3.5–5.1)
SODIUM: 137 mmol/L (ref 135–145)

## 2017-01-26 LAB — CREATININE, SERUM
Creatinine, Ser: 4.31 mg/dL — ABNORMAL HIGH (ref 0.44–1.00)
GFR calc Af Amer: 15 mL/min — ABNORMAL LOW (ref >60)
GFR calc non Af Amer: 13 mL/min — ABNORMAL LOW (ref >60)

## 2017-01-26 LAB — C3 COMPLEMENT: C3 Complement: 19 mg/dL — ABNORMAL LOW (ref 82–167)

## 2017-01-26 LAB — C4 COMPLEMENT

## 2017-01-26 MED ORDER — SODIUM CHLORIDE 0.9 % IV SOLN
250.0000 mg | Freq: Every day | INTRAVENOUS | Status: DC
  Administered 2017-01-26: 250 mg via INTRAVENOUS
  Filled 2017-01-26: qty 2
  Filled 2017-01-26: qty 4
  Filled 2017-01-26: qty 2

## 2017-01-26 NOTE — Progress Notes (Signed)
  Echocardiogram 2D Echocardiogram has been performed.  Merrie Roof F 01/26/2017, 9:31 AM

## 2017-01-26 NOTE — Progress Notes (Signed)
Subjective:  UOP picked up but creatinine worse.  Complements low- ANA and anti DSDNA still pending. Received solumedrol this AM.  U/S big kidneys- echogenic   Objective Vital signs in last 24 hours: Vitals:   01/25/17 0300 01/25/17 1432 01/25/17 2042 01/26/17 0403  BP: 116/72 (!) 140/91 (!) 150/99 134/89  Pulse: 97 83 98 (!) 103  Resp: 18 19 20 16   Temp: 98.3 F (36.8 C) 97.9 F (36.6 C) 98.9 F (37.2 C) 97.8 F (36.6 C)  TempSrc: Oral Oral Oral Oral  SpO2: 100% 100% 100% 100%  Weight: 117 kg (257 lb 15 oz)   118.9 kg (262 lb 2 oz)  Height:       Weight change: 1.9 kg (4 lb 3 oz)  Intake/Output Summary (Last 24 hours) at 01/26/2017 1224 Last data filed at 01/26/2017 1057 Gross per 24 hour  Intake 290 ml  Output 1750 ml  Net -1460 ml   Assessment/Plan: 26 year old black female with 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 Now with AKI in the setting of an axillary abscess with antibiotic and NSAID therapy 1.Renal- AKI. Differential would include an infection related acute kidney injury, analgesic nephropathy, supra therapeutic vancomycin level versus flare of lupus nephritis. Given the proteinuria is actually improved from the past and that she has no hematuria I am less suspicious of an acute lupus nephritis flare.  Lupus parameters are pending but complements are low- got some solumedrol.  Sedimentation rate and CRP are elevated but they would be elevated in the setting of infection. Offending medications have been stopped, and infection appears to be improving. If AKI due to these things I would suspect it will improve and hope that that is the case- making better urine so that is good sign 2. Hypertension/volume  - I think her tank is full.  stopped IV fluids and now with better UOP edema is improving.  Hesitant to push it with lasix.  BP may rise due to steroids- no BP meds for now 3. Axillary abscess  - has improved with antibiotic  therapy. Antibiotics now trimmed to ceftriaxone only       Christopher Hink A    Labs: Basic Metabolic Panel: Recent Labs  Lab 01/24/17 0448 01/25/17 0425 01/26/17 0444  NA 139 136 137  K 4.0 4.1 3.9  CL 113* 113* 112*  CO2 20* 19* 18*  GLUCOSE 122* 91 88  BUN 16 29* 35*  CREATININE 1.19* 3.32* 4.38*  4.31*  CALCIUM 7.6* 7.3* 7.8*  PHOS  --   --  5.5*   Liver Function Tests: Recent Labs  Lab 01/23/17 1935 01/24/17 0448 01/25/17 0425 01/26/17 0444  AST 90* 74* 68*  --   ALT 38 33 35  --   ALKPHOS 44 37* 31*  --   BILITOT 0.6 0.8 0.9  --   PROT 8.9* 7.3 7.0  --   ALBUMIN 2.9* 2.3* 2.2* 2.2*   No results for input(s): LIPASE, AMYLASE in the last 168 hours. No results for input(s): AMMONIA in the last 168 hours. CBC: Recent Labs  Lab 01/23/17 1935 01/24/17 0448 01/25/17 0425  WBC 4.8 4.7 4.8  NEUTROABS 3.9  --   --   HGB 10.3* 9.5* 8.7*  HCT 31.4* 29.4* 26.3*  MCV 87.7 88.3 87.7  PLT 268 247 229   Cardiac Enzymes: No results for input(s): CKTOTAL, CKMB, CKMBINDEX, TROPONINI in the last 168 hours. CBG: No results for input(s): GLUCAP in the last  168 hours.  Iron Studies: No results for input(s): IRON, TIBC, TRANSFERRIN, FERRITIN in the last 72 hours. Studies/Results: US Renal  Result Date: 01/25/2017 CLINICAL DATA:  Acute renal failure EXAM: RENAL / URINARY TRACT ULTRASOUND COMPLETE COMPARISON:  None. FINDINGS: Right Kidney: Length: 13.5 cm. Increased echotexture throughout the left kidney. No hydronephrosis or mass. Left Kidney: Length: 14.1 cm. Increased echotexture throughout the left kidney. No hydronephrosis or mass. Bladder: Appears normal for degree of bladder distention. IMPRESSION: Increased echotexture within the kidneys bilaterally. No hydronephrosis. Electronically Signed   By: Rolm Baptise M.D.   On: 01/25/2017 09:36   [place] Chest Soft Tissue  Result Date: 01/24/2017 CLINICAL DATA:  Cellulitis. Fever. Possible abscess in the left  axilla. EXAM: ULTRASOUND OF Left AXILLARY SOFT TISSUES TECHNIQUE: Ultrasound examination of the axillary soft tissues was performed in the area of clinical concern. COMPARISON:  None. FINDINGS: Ultrasound was performed in the inferior left axilla/ lateral chest wall in the area of clinical concern. There is diffuse soft tissue edema. Minimal superficial fluid was noted just deep to a wound, however no organized fluid collection was seen to indicate abscess. Two enlarged lymph nodes measure 4.4 x 1.9 x 2.4 cm and 4.4 x 1.5 x 2.8 cm, both with cortical thickening but still with visible fatty hila and likely reactive in etiology. IMPRESSION: Left axillary soft tissue edema consistent with cellulitis. No evidence of abscess. Electronically Signed   By: Logan Bores M.D.   On: 01/24/2017 19:49   Medications: Infusions: . cefTRIAXone (ROCEPHIN)  IV Stopped (01/25/17 2051)  . methylPREDNISolone (SOLU-MEDROL) injection Stopped (01/26/17 1048)    Scheduled Medications: . doxycycline  100 mg Oral Q12H  . enoxaparin (LOVENOX) injection  40 mg Subcutaneous Q24H    have reviewed scheduled and prn medications.  Physical Exam: General: so pleasant and nontoxic appearing Heart: tachy Lungs: clear Abdomen: soft, non tender Extremities: pitting edema- diffuse rash on extremities     01/26/2017,12:24 PM  LOS: 3 days

## 2017-01-26 NOTE — Progress Notes (Signed)
PROGRESS NOTE  Anne Robinson  LHT:[id number] DOB: 01-27-1991 DOA: 01/23/2017 PCP: Karleen Hampshire., MD  Outpatient Specialists: Rheumatology, Dr. Gerilyn Nestle Brief Narrative: Anne Vera Scottis a 26 y.o.femalewith hx of SLE, R axillary abscess Mar '18 who presented to ED with 2 wk hx of drainage from L axilla, and a 24- 48 hr hx of fevers, chills and N/V and SOB. In ED temp was 99 then 103deg. BP's good, RR 25-35 and HR 120- 130. She doesn't feel good. Asked to see for admission.   Pt has a left axillary abscess that has been managed by her dermatologist with 10 days of doxycycline that was unsuccessfully treated. Patient followed up with the dermatologist who changed her antibiotics to Bactrim. However patient presented to the emergency department today due to feeling subjective fevers, feeling short of breath and generally fatigued. Patient reports that the abscesseshas been draining pus for days.   Hx SLE x 5 yrs, was on Cellcept but her "markers" recently were good , not taking anything now. Has a rash on both LE's, been there about 1 weeks, she is not sure what it is.   Assessment & Plan: Principal Problem:   Abscess of axilla, left Active Problems:   Sepsis ([place])   HTN (hypertension)   SLE (systemic lupus erythematosus) ([facility])   Tachypnea   Dyspnea   Macular rash   Abscess of left axilla  Left axilla abscess and cellulitis: Spontaneously ruptured with no residual abscess on exam or U/S. Has been evaluated by general surgery, no indication for I&D or debridement. - Continue antibiotics. Stopped vancomycin due to concern for nephrotoxicity. Will continue ceftriaxone/doxycycline. - Monitor blood cultures from 11/19.   Acute renal failure: With supratherapeutic vancomycin trough, NSAID use, and infection. Pr:Cr checked, 0.7. Renal U/S with enlarged kidneys, increased echogenicity, and no hydronephrosis. Discussed with nephrology, Dr. Moshe Cipro who is dubious of possible lupus  nephritis.  - Monitor UOP (improving), and BMP (SCr rate of rise has improved) - Consulted nephrology, no indication for hemodialysis at this time.  - Hold vancomycin, avoiding nephrotoxins.  SLE with acute exacerbation: No longer on cellcept, plaquenil, prednisone. Followed by Dr. Gerilyn Nestle every 6 months since inflammatory markers have improved. ESR, CRP elevated, and complement very low. ds DNA still pending.  - Start steroids today, continue IV steroids daily x3 days, then taper down. Holding MMF for now.  - Will need early follow up with rheumatology.   Edema/rash: Diffusely, possibly related to SLE. Improving in LE's with augmented UOP. - Continue to monitor, no indication to augment diuresis at this time.   Dyspnea: Possibly related to volume overload, borderline cardiomegaly on CXR, vascular congestion. BNP not impressively elevated and echocardiogram showing only mild diastolic dysfunction. - Monitor with diuresis.   DVT prophylaxis: Lovenox Code Status: Full Family Communication: Sister at bedside Disposition Plan: DC home once improving.  Consultants:   Nephrology  Procedures:   Echocardiogram 01/26/2017: - Left ventricle: The cavity size was normal. Wall thickness was   increased in a pattern of mild LVH. Systolic function was normal.   The estimated ejection fraction was in the range of 60% to 65%.   Wall motion was normal; there were no regional wall motion   abnormalities. Doppler parameters are consistent with abnormal   left ventricular relaxation (grade 1 diastolic dysfunction). - Aortic valve: Valve area (VTI): 1.57 cm^2. Valve area (Vmax):   1.48 cm^2. Valve area (Vmean): 1.55 cm^2. - Left atrium: The atrium was mildly dilated. - Pericardium, extracardiac:  A trivial pericardial effusion was   identified.  Impressions: - Normal LV systolic function; mild LVH; mild diastolic   dysfunction; mild LAE; trivial pericardial  effusion.  Antimicrobials:  Vancomycin/zosyn 11/20 - 11/21  Ceftriaxone, doxycycline 11/21 >>    Subjective: Swelling in hands is stable, though feet swelling has improved, feel less dense. UOP increased. Denies chest pain and does not report dyspnea today.   Objective: Vitals:   01/25/17 0300 01/25/17 1432 01/25/17 2042 01/26/17 0403  BP: 116/72 (!) 140/91 (!) 150/99 134/89  Pulse: 97 83 98 (!) 103  Resp: _0 Temp: 98.3 F (36.8 C) 97.9 F (36.6 C) 98.9 F (37.2 C) 97.8 F (36.6 C)  TempSrc: Oral Oral Oral Oral  SpO2: 100% 100% 100% 100%  Weight: 117 kg (257 lb 15 oz)   118.9 kg (262 lb 2 oz)  Height:        Intake/Output Summary (Last 24 hours) at 01/26/2017 1428 Last data filed at 01/26/2017 1057 Gross per 24 hour  Intake 290 ml  Output 1750 ml  Net -1460 ml   Filed Weights   01/24/17 0500 01/25/17 0300 01/26/17 0403  Weight: 118 kg (260 lb 2.3 oz) 117 kg (257 lb 15 oz) 118.9 kg (262 lb 2 oz)   Gen: Obese, pleasant 26yo female in no distress  Pulm: Non-labored breathing room air. Clear to auscultation bilaterally.  CV: Regular rate and rhythm. No murmur, rub, or gallop. No JVD GI: Abdomen soft, non-tender, non-distended, with normoactive bowel sounds. No organomegaly or masses felt. Ext: Diffusely edematous upper and lower extremities, though feet and legs have improved.  Skin: Left axilla with focal tender induration without fluctuance. Livedo reticularis is stable from prior exams in legs. Neuro: Alert and oriented. No focal neurological deficits. Psych: Judgement and insight appear normal. Mood & affect appropriate.   Data Reviewed: I have personally reviewed following labs and imaging studies  CBC: Recent Labs  Lab 01/23/17 1935 01/24/17 0448 01/25/17 0425  WBC 4.8 4.7 4.8  NEUTROABS 3.9  --   --   HGB 10.3* 9.5* 8.7*  HCT 31.4* 29.4* 26.3*  MCV 87.7 88.3 87.7  PLT 268 247 242   Basic Metabolic Panel: Recent Labs  Lab 01/23/17 1935  01/24/17 0448 01/25/17 0425 01/26/17 0444  NA 134* 139 136 137  K 2.8* 4.0 4.1 3.9  CL 109 113* 113* 112*  CO2 21* 20* 19* 18*  GLUCOSE 99 122* 91 88  BUN 14 16 29* 35*  CREATININE 0.80 1.19* 3.32* 4.38*  4.31*  CALCIUM 8.2* 7.6* 7.3* 7.8*  PHOS  --   --   --  5.5*   GFR: Estimated Creatinine Clearance: 22.7 mL/min (A) (by C-G formula based on SCr of 4.31 mg/dL (H)). Liver Function Tests: Recent Labs  Lab 01/23/17 1935 01/24/17 0448 01/25/17 0425 01/26/17 0444  AST 90* 74* 68*  --   ALT 38 33 35  --   ALKPHOS 44 37* 31*  --   BILITOT 0.6 0.8 0.9  --   PROT 8.9* 7.3 7.0  --   ALBUMIN 2.9* 2.3* 2.2* 2.2*   No results for input(s): LIPASE, AMYLASE in the last 168 hours. No results for input(s): AMMONIA in the last 168 hours. Coagulation Profile: No results for input(s): INR, PROTIME in the last 168 hours. Cardiac Enzymes: No results for input(s): CKTOTAL, CKMB, CKMBINDEX, TROPONINI in the last 168 hours. BNP (last 3 results) No results for input(s): PROBNP in the last 8760  hours. HbA1C: No results for input(s): HGBA1C in the last 72 hours. CBG: No results for input(s): GLUCAP in the last 168 hours. Lipid Profile: No results for input(s): CHOL, HDL, LDLCALC, TRIG, CHOLHDL, LDLDIRECT in the last 72 hours. Thyroid Function Tests: No results for input(s): TSH, T4TOTAL, FREET4, T3FREE, THYROIDAB in the last 72 hours. Anemia Panel: No results for input(s): VITAMINB12, FOLATE, FERRITIN, TIBC, IRON, RETICCTPCT in the last 72 hours. Urine analysis:    Component Value Date/Time   COLORURINE STRAW (A) 01/25/2017 0222   APPEARANCEUR CLEAR 01/25/2017 0222   LABSPEC 1.008 01/25/2017 0222   PHURINE 6.0 01/25/2017 0222   GLUCOSEU NEGATIVE 01/25/2017 0222   HGBUR MODERATE (A) 01/25/2017 0222   BILIRUBINUR NEGATIVE 01/25/2017 0222   KETONESUR NEGATIVE 01/25/2017 0222   PROTEINUR NEGATIVE 01/25/2017 0222   UROBILINOGEN 1.0 10/09/2011 1941   NITRITE NEGATIVE 01/25/2017 0222    LEUKOCYTESUR NEGATIVE 01/25/2017 0222   Recent Results (from the past 240 hour(s))  Blood culture (routine x 2)     Status: None (Preliminary result)   Collection Time: 01/23/17  7:35 PM  Result Value Ref Range Status   Specimen Description BLOOD RIGHT ANTECUBITAL  Final   Special Requests   Final    BOTTLES DRAWN AEROBIC AND ANAEROBIC Blood Culture adequate volume   Culture   Final    NO GROWTH 2 DAYS Performed at [facility], [place] [address] [place], [place] [address]    Report Status PENDING  Incomplete  Blood culture (routine x 2)     Status: None (Preliminary result)   Collection Time: 01/23/17  7:49 PM  Result Value Ref Range Status   Specimen Description BLOOD BLOOD RIGHT HAND  Final   Special Requests   Final    IN PEDIATRIC BOTTLE Blood Culture results may not be optimal due to an inadequate volume of blood received in culture bottles   Culture   Final    NO GROWTH 2 DAYS Performed at [facility], [place] [address], [place], [place] [address]    Report Status PENDING  Incomplete      Radiology Studies: US Renal  Result Date: 01/25/2017 CLINICAL DATA:  Acute renal failure EXAM: RENAL / URINARY TRACT ULTRASOUND COMPLETE COMPARISON:  None. FINDINGS: Right Kidney: Length: 13.5 cm. Increased echotexture throughout the left kidney. No hydronephrosis or mass. Left Kidney: Length: 14.1 cm. Increased echotexture throughout the left kidney. No hydronephrosis or mass. Bladder: Appears normal for degree of bladder distention. IMPRESSION: Increased echotexture within the kidneys bilaterally. No hydronephrosis. Electronically Signed   By: Rolm Baptise M.D.   On: 01/25/2017 09:36   [place] Chest Soft Tissue  Result Date: 01/24/2017 CLINICAL DATA:  Cellulitis. Fever. Possible abscess in the left axilla. EXAM: ULTRASOUND OF Left AXILLARY SOFT TISSUES TECHNIQUE: Ultrasound examination of the axillary soft tissues was performed in the area of clinical concern. COMPARISON:  None.  FINDINGS: Ultrasound was performed in the inferior left axilla/ lateral chest wall in the area of clinical concern. There is diffuse soft tissue edema. Minimal superficial fluid was noted just deep to a wound, however no organized fluid collection was seen to indicate abscess. Two enlarged lymph nodes measure 4.4 x 1.9 x 2.4 cm and 4.4 x 1.5 x 2.8 cm, both with cortical thickening but still with visible fatty hila and likely reactive in etiology. IMPRESSION: Left axillary soft tissue edema consistent with cellulitis. No evidence of abscess. Electronically Signed   By: Logan Bores M.D.   On:  01/24/2017 19:49    Scheduled Meds: . doxycycline  100 mg Oral Q12H  . enoxaparin (LOVENOX) injection  40 mg Subcutaneous Q24H   Continuous Infusions: . cefTRIAXone (ROCEPHIN)  IV Stopped (01/25/17 2051)  . methylPREDNISolone (SOLU-MEDROL) injection Stopped (01/26/17 1048)     LOS: 3 days   Time spent: 35 minutes.  Vance Gather, MD [facility] Pager [phone]  If 7PM-7AM, please contact night-coverage [email or web address] Password [facility] 01/26/2017, 2:28 PM

## 2017-01-27 DIAGNOSIS — L03313 Cellulitis of chest wall: Secondary | ICD-10-CM

## 2017-01-27 LAB — RENAL FUNCTION PANEL
ALBUMIN: 2.4 g/dL — AB (ref 3.5–5.0)
ANION GAP: 7 (ref 5–15)
BUN: 43 mg/dL — AB (ref 6–20)
CALCIUM: 8.4 mg/dL — AB (ref 8.9–10.3)
CO2: 18 mmol/L — ABNORMAL LOW (ref 22–32)
Chloride: 113 mmol/L — ABNORMAL HIGH (ref 101–111)
Creatinine, Ser: 4.39 mg/dL — ABNORMAL HIGH (ref 0.44–1.00)
GFR calc Af Amer: 15 mL/min — ABNORMAL LOW (ref >60)
GFR, EST NON AFRICAN AMERICAN: 13 mL/min — AB (ref >60)
GLUCOSE: 159 mg/dL — AB (ref 65–99)
PHOSPHORUS: 6.4 mg/dL — AB (ref 2.5–4.6)
Potassium: 4.5 mmol/L (ref 3.5–5.1)
SODIUM: 138 mmol/L (ref 135–145)

## 2017-01-27 LAB — FANA STAINING PATTERNS

## 2017-01-27 LAB — ANTI-DNA ANTIBODY, DOUBLE-STRANDED

## 2017-01-27 LAB — ANTINUCLEAR ANTIBODIES, IFA: ANA Ab, IFA: POSITIVE — AB

## 2017-01-27 MED ORDER — PREDNISONE 20 MG PO TABS
40.0000 mg | ORAL_TABLET | Freq: Every day | ORAL | Status: DC
Start: 2017-01-28 — End: 2017-01-28
  Administered 2017-01-28: 40 mg via ORAL
  Filled 2017-01-27: qty 2

## 2017-01-27 NOTE — Progress Notes (Signed)
Subjective:  UOP picked up- creatinine stable.  Complements low-  anti DSDNA very positive. On solumedrol since Thursday.  Feels better, edema down  Objective Vital signs in last 24 hours: Vitals:   01/26/17 0403 01/26/17 1904 01/26/17 1957 01/27/17 0452  BP: 134/89 (!) 159/121 (!) 153/97 (!) 150/99  Pulse: (!) 103 93 83 72  Resp: 16 15 18 16   Temp: 97.8 F (36.6 C) 98.5 F (36.9 C) 98 F (36.7 C) 98.1 F (36.7 C)  TempSrc: Oral Axillary Oral Oral  SpO2: 100% 100% 100% 100%  Weight: 118.9 kg (262 lb 2 oz)   117.3 kg (258 lb 9.6 oz)  Height:       Weight change: -1.6 kg (-8.4 oz)  Intake/Output Summary (Last 24 hours) at 01/27/2017 1255 Last data filed at 01/27/2017 0930 Gross per 24 hour  Intake 530 ml  Output 1300 ml  Net -770 ml   Assessment/Plan: 26 year old black female with 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 Now with AKI in the setting of an axillary abscess with antibiotic and NSAID therapy 1.Renal- AKI. I feel is more an infection related acute kidney injury/ analgesic nephropathy/supra therapeutic vancomycin level versus flare of lupus nephritis since  proteinuria is actually improved from the past and that she has no hematuria   Lupus parameters seemingly positive and complements are low- got some solumedrol.  Sedimentation rate and CRP are elevated but they would be elevated in the setting of infection. Offending medications have been stopped, and infection appears to be improving. - making better urine- crt stable so that is good sign 2. Hypertension/volume  - I think her tank is full.  stopped IV fluids and now with better UOP edema is improving.  She says edema is better- I think some UOP has been missed.  BP may rise due to steroids- no BP meds for now 3. Axillary abscess  - has improved with antibiotic therapy. Antibiotics now trimmed to ceftriaxone only 4. Dispo- just from renal standpoint, I would feel comfortable with  discharge once creatinine starts to decline- I can communicate with Dr. Dimas Aguas (her priamry nephrologist) for follow up      [place]: Basic Metabolic Panel: Recent Labs  Lab 01/25/17 0425 01/26/17 0444 01/27/17 0648  NA 136 137 138  K 4.1 3.9 4.5  CL 113* 112* 113*  CO2 19* 18* 18*  GLUCOSE 91 88 159*  BUN 29* 35* 43*  CREATININE 3.32* 4.38*  4.31* 4.39*  CALCIUM 7.3* 7.8* 8.4*  PHOS  --  5.5* 6.4*   Liver Function Tests: Recent Labs  Lab 01/23/17 1935 01/24/17 0448 01/25/17 0425 01/26/17 0444 01/27/17 0648  AST 90* 74* 68*  --   --   ALT 38 33 35  --   --   ALKPHOS 44 37* 31*  --   --   BILITOT 0.6 0.8 0.9  --   --   PROT 8.9* 7.3 7.0  --   --   ALBUMIN 2.9* 2.3* 2.2* 2.2* 2.4*   No results for input(s): LIPASE, AMYLASE in the last 168 hours. No results for input(s): AMMONIA in the last 168 hours. CBC: Recent Labs  Lab 01/23/17 1935 01/24/17 0448 01/25/17 0425  WBC 4.8 4.7 4.8  NEUTROABS 3.9  --   --   HGB 10.3* 9.5* 8.7*  HCT 31.4* 29.4* 26.3*  MCV 87.7 88.3 87.7  PLT 268 247 229   Cardiac Enzymes: No  results for input(s): CKTOTAL, CKMB, CKMBINDEX, TROPONINI in the last 168 hours. CBG: No results for input(s): GLUCAP in the last 168 hours.  Iron Studies: No results for input(s): IRON, TIBC, TRANSFERRIN, FERRITIN in the last 72 hours. Studies/Results: No results found. Medications: Infusions: . cefTRIAXone (ROCEPHIN)  IV Stopped (01/26/17 2025)  . methylPREDNISolone (SOLU-MEDROL) injection Stopped (01/26/17 1048)    Scheduled Medications: . doxycycline  100 mg Oral Q12H  . enoxaparin (LOVENOX) injection  40 mg Subcutaneous Q24H    have reviewed scheduled and prn medications.  Physical Exam: General: so pleasant and nontoxic appearing Heart: tachy Lungs: clear Abdomen: soft, non tender Extremities: pitting edema- diffuse rash on extremities - both better     01/27/2017,12:55 PM  LOS: 4 days

## 2017-01-27 NOTE — Progress Notes (Signed)
PROGRESS NOTE  Anne Robinson  EML:[id number] DOB: Oct 20, 1990 DOA: 01/23/2017 PCP: Karleen Hampshire., MD  Outpatient Specialists: Rheumatology, Dr. Gerilyn Nestle Brief Narrative: Anne Robinson a 26 y.o.femalewith hx of SLE, R axillary abscess Mar '18 who presented to ED with 2 wk hx of drainage from L axilla, and a 27- 48 hr hx of fevers, chills and N/V and SOB. In ED temp was 99 then 103deg. BP's good, RR 25-35 and HR 120- 130. She doesn't feel good. Asked to see for admission.   Pt has a left axillary abscess that has been managed by her dermatologist with 10 days of doxycycline that was unsuccessfully treated. Patient followed up with the dermatologist who changed her antibiotics to Bactrim. However patient presented to the emergency department today due to feeling subjective fevers, feeling short of breath and generally fatigued. Patient reports that the abscesseshas been draining pus for days.   Hx SLE x 5 yrs, was on Cellcept but her "markers" recently were good, not taking anything now. Has a rash on both LE's, been there about 1 weeks, she is not sure what it is.   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 Has been evaluated by general surgery, no indication for I&D or debridement. - Continue antibiotics. Stopped vancomycin due to concern for nephrotoxicity. Continuing ceftriaxone/doxycycline. - Monitor blood cultures from 11/19, NGTD x4 days  Acute renal failure: With supratherapeutic vancomycin trough, NSAID use, and infection. Pr:Cr checked, 0.7, actually improving from prior, no hematuria. Renal  U/S with enlarged kidneys, increased echogenicity, and no hydronephrosis. - Monitor UOP (improving), and BMP (SCr plateau on 11/23 at 4.39) [facility] nephrology, Dr. Moshe Cipro, who will get in touch with pt's nephrologist, Dr. Dimas Aguas.  - If renal function improving, pt may be discharged from renal perspective.  - Hold vancomycin, and NSAIDs, avoiding nephrotoxins. Renally dose medications.  SLE with acute exacerbation: No longer on cellcept, plaquenil, prednisone. Followed by Dr. Gerilyn Nestle every 6 months since inflammatory markers have improved. ESR, CRP elevated, ds DNA very high (>300) complement very low (C3 is 19, C4 is undetectable <2).  - Discussed with patient's rheumatologist by phone. Solumedrol 249m IV started 11/22. Plan is to taper to prednisone 440mdaily. Her rheumatologist's office will call her on Monday to schedule follow up within 1 week. - Will follow up with rheumatology.   Edema/rash: Diffusely, suspect related to SLE. Improving. - Continue to monitor, no indication to augment diuresis at this time.   Dyspnea: Resolved. Possibly related to volume overload, borderline cardiomegaly on CXR, vascular congestion. BNP not impressively elevated and echocardiogram showing only mild diastolic dysfunction.  DVT prophylaxis: Lovenox Code Status: Full Family Communication: None at bedside Disposition Plan: DC home once improving.  Consultants:   Nephrology, Dr. GoLouie BostonProcedures:   Echocardiogram 01/26/2017: - Left ventricle: The cavity size was normal. Wall thickness was   increased in a pattern of mild LVH. Systolic function was normal.   The estimated ejection fraction was in the range of 60% to 65%.   Wall motion was normal; there were no regional wall motion   abnormalities. Doppler  parameters are consistent with abnormal   left ventricular relaxation (grade 1 diastolic dysfunction). - Aortic valve: Valve area (VTI): 1.57 cm^2. Valve area (Vmax):   1.48  cm^2. Valve area (Vmean): 1.55 cm^2. - Left atrium: The atrium was mildly dilated. - Pericardium, extracardiac: A trivial pericardial effusion was   identified.  Impressions: - Normal LV systolic function; mild LVH; mild diastolic   dysfunction; mild LAE; trivial pericardial effusion.  Antimicrobials:  Vancomycin/zosyn 11/20 - 11/21  Ceftriaxone, doxycycline 11/21 >>    Subjective: Swelling improving throughout, feet feel less dense. Rash is stable, not bothersome. UOP picked up. Left axilla tenderness is stable/slightly improved.   Objective: Vitals:   01/26/17 0403 01/26/17 1904 01/26/17 1957 01/27/17 0452  BP: 134/89 (!) 159/121 (!) 153/97 (!) 150/99  Pulse: (!) 103 93 83 72  Resp: _0 Temp: 97.8 F (36.6 C) 98.5 F (36.9 C) 98 F (36.7 C) 98.1 F (36.7 C)  TempSrc: Oral Axillary Oral Oral  SpO2: 100% 100% 100% 100%  Weight: 118.9 kg (262 lb 2 oz)   117.3 kg (258 lb 9.6 oz)  Height:        Intake/Output Summary (Last 24 hours) at 01/27/2017 1426 Last data filed at 01/27/2017 0930 Gross per 24 hour  Intake 530 ml  Output 1300 ml  Net -770 ml   Filed Weights   01/25/17 0300 01/26/17 0403 01/27/17 0452  Weight: 117 kg (257 lb 15 oz) 118.9 kg (262 lb 2 oz) 117.3 kg (258 lb 9.6 oz)   Gen: Obese, pleasant 26yo female in no distress  Pulm: Non-labored breathing room air. Clear to auscultation bilaterally.  CV: Regular rate and rhythm. No murmur, rub, or gallop. No JVD GI: Abdomen soft, obese, non-tender, non-distended, with normoactive bowel sounds. No organomegaly or masses felt. Ext: Diffusely edematous upper and lower extremities, improved from prior exams.  Skin: Left axilla with focal tender induration without fluctuance, open superficial wound is stable without purulence. 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 Neuro: Alert and oriented. No focal neurological deficits. Psych: Judgement and insight appear normal. Mood &  affect appropriate.   Data Reviewed: I have personally reviewed following labs and imaging studies  CBC: Recent Labs  Lab 01/23/17 1935 01/24/17 0448 01/25/17 0425  WBC 4.8 4.7 4.8  NEUTROABS 3.9  --   --   HGB 10.3* 9.5* 8.7*  HCT 31.4* 29.4* 26.3*  MCV 87.7 88.3 87.7  PLT 268 247 160   Basic Metabolic Panel: Recent Labs  Lab 01/23/17 1935 01/24/17 0448 01/25/17 0425 01/26/17 0444 01/27/17 0648  NA 134* 139 136 137 138  K 2.8* 4.0 4.1 3.9 4.5  CL 109 113* 113* 112* 113*  CO2 21* 20* 19* 18* 18*  GLUCOSE 99 122* 91 88 159*  BUN 14 16 29* 35* 43*  CREATININE 0.80 1.19* 3.32* 4.38*  4.31* 4.39*  CALCIUM 8.2* 7.6* 7.3* 7.8* 8.4*  PHOS  --   --   --  5.5* 6.4*   GFR: Estimated Creatinine Clearance: 22.1 mL/min (A) (by C-G formula based on SCr of 4.39 mg/dL (H)). Liver Function Tests: Recent Labs  Lab 01/23/17 1935 01/24/17 0448 01/25/17 0425 01/26/17 0444 01/27/17 0648  AST 90* 74* 68*  --   --   ALT 38 33 35  --   --   ALKPHOS 44 37* 31*  --   --   BILITOT 0.6 0.8 0.9  --   --  PROT 8.9* 7.3 7.0  --   --   ALBUMIN 2.9* 2.3* 2.2* 2.2* 2.4*   No results for input(s): LIPASE, AMYLASE in the last 168 hours. No results for input(s): AMMONIA in the last 168 hours. Coagulation Profile: No results for input(s): INR, PROTIME in the last 168 hours. Cardiac Enzymes: No results for input(s): CKTOTAL, CKMB, CKMBINDEX, TROPONINI in the last 168 hours. BNP (last 3 results) No results for input(s): PROBNP in the last 8760 hours. HbA1C: No results for input(s): HGBA1C in the last 72 hours. CBG: No results for input(s): GLUCAP in the last 168 hours. Lipid Profile: No results for input(s): CHOL, HDL, LDLCALC, TRIG, CHOLHDL, LDLDIRECT in the last 72 hours. Thyroid Function Tests: No results for input(s): TSH, T4TOTAL, FREET4, T3FREE, THYROIDAB in the last 72 hours. Anemia Panel: No results for input(s): VITAMINB12, FOLATE, FERRITIN, TIBC, IRON, RETICCTPCT in the last  72 hours. Urine analysis:    Component Value Date/Time   COLORURINE STRAW (A) 01/25/2017 0222   APPEARANCEUR CLEAR 01/25/2017 0222   LABSPEC 1.008 01/25/2017 0222   PHURINE 6.0 01/25/2017 0222   GLUCOSEU NEGATIVE 01/25/2017 0222   HGBUR MODERATE (A) 01/25/2017 0222   BILIRUBINUR NEGATIVE 01/25/2017 0222   KETONESUR NEGATIVE 01/25/2017 0222   PROTEINUR NEGATIVE 01/25/2017 0222   UROBILINOGEN 1.0 10/09/2011 1941   NITRITE NEGATIVE 01/25/2017 0222   LEUKOCYTESUR NEGATIVE 01/25/2017 0222   Recent Results (from the past 240 hour(s))  Blood culture (routine x 2)     Status: None (Preliminary result)   Collection Time: 01/23/17  7:35 PM  Result Value Ref Range Status   Specimen Description BLOOD RIGHT ANTECUBITAL  Final   Special Requests   Final    BOTTLES DRAWN AEROBIC AND ANAEROBIC Blood Culture adequate volume   Culture   Final    NO GROWTH 4 DAYS Performed at [facility], [place] [address], [place], [place] [address]    Report Status PENDING  Incomplete  Blood culture (routine x 2)     Status: None (Preliminary result)   Collection Time: 01/23/17  7:49 PM  Result Value Ref Range Status   Specimen Description BLOOD BLOOD RIGHT HAND  Final   Special Requests   Final    IN PEDIATRIC BOTTLE Blood Culture results may not be optimal due to an inadequate volume of blood received in culture bottles   Culture   Final    NO GROWTH 4 DAYS Performed at [facility], [place] [address] [place], [place] [address]    Report Status PENDING  Incomplete      Radiology Studies: No results found.  Scheduled Meds: . doxycycline  100 mg Oral Q12H  . enoxaparin (LOVENOX) injection  40 mg Subcutaneous Q24H   Continuous Infusions: . cefTRIAXone (ROCEPHIN)  IV Stopped (01/26/17 2025)  . methylPREDNISolone (SOLU-MEDROL) injection Stopped (01/26/17 1048)     LOS: 4 days   Time spent: 35 minutes.  Vance Gather, MD [facility] Pager [phone]  If 7PM-7AM, please contact  night-coverage [email or web address] Password [email or web address] 01/27/2017, 2:26 PM

## 2017-01-28 LAB — CULTURE, BLOOD (ROUTINE X 2)
Culture: NO GROWTH
Culture: NO GROWTH
Special Requests: ADEQUATE

## 2017-01-28 LAB — RENAL FUNCTION PANEL
ALBUMIN: 2.3 g/dL — AB (ref 3.5–5.0)
ANION GAP: 6 (ref 5–15)
BUN: 44 mg/dL — ABNORMAL HIGH (ref 6–20)
CHLORIDE: 114 mmol/L — AB (ref 101–111)
CO2: 19 mmol/L — AB (ref 22–32)
Calcium: 8.5 mg/dL — ABNORMAL LOW (ref 8.9–10.3)
Creatinine, Ser: 3.89 mg/dL — ABNORMAL HIGH (ref 0.44–1.00)
GFR calc Af Amer: 17 mL/min — ABNORMAL LOW (ref >60)
GFR calc non Af Amer: 15 mL/min — ABNORMAL LOW (ref >60)
GLUCOSE: 94 mg/dL (ref 65–99)
PHOSPHORUS: 5.5 mg/dL — AB (ref 2.5–4.6)
POTASSIUM: 3.9 mmol/L (ref 3.5–5.1)
Sodium: 139 mmol/L (ref 135–145)

## 2017-01-28 MED ORDER — CEPHALEXIN 250 MG PO CAPS: 250.0000 mg | ORAL_CAPSULE | Freq: Three times a day (TID) | ORAL | 0 refills | Status: DC

## 2017-01-28 MED ORDER — ONDANSETRON HCL 4 MG PO TABS: 4.0000 mg | ORAL_TABLET | Freq: Four times a day (QID) | ORAL | 0 refills | Status: DC | PRN

## 2017-01-28 MED ORDER — PREDNISONE 20 MG PO TABS: 40.0000 mg | ORAL_TABLET | Freq: Every day | ORAL | 0 refills | Status: DC

## 2017-01-28 NOTE — Progress Notes (Signed)
Patient is a&ox4 ambulatory without assist. Discharge instructions reviewed. Questions, concerns denied. Dressing remains CDI.

## 2017-01-28 NOTE — Progress Notes (Signed)
Subjective:  1600 of UOP - creatinine down.  Complements low-  anti DSDNA, ANA very positive. On solumedrol since Thursday.  Feels better, edema down- weight down  Objective Vital signs in last 24 hours: Vitals:   01/27/17 0452 01/27/17 1530 01/27/17 2045 01/28/17 0413  BP: (!) 150/99 (!) 149/89 (!) 160/95 (!) 159/99  Pulse: 72 86 90 90  Resp: 16 16 (!) 24 (!) 22  Temp: 98.1 F (36.7 C) 97.9 F (36.6 C) 98.4 F (36.9 C) 98.5 F (36.9 C)  TempSrc: Oral Oral Oral Oral  SpO2: 100% 100% 100% 99%  Weight: 117.3 kg (258 lb 9.6 oz)   115.9 kg (255 lb 8.2 oz)  Height:       Weight change: -1.4 kg (-1.4 oz)  Intake/Output Summary (Last 24 hours) at 01/28/2017 1214 Last data filed at 01/28/2017 1100 Gross per 24 hour  Intake 390 ml  Output 1150 ml  Net -760 ml   Assessment/Plan: 26 year old black female with 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 Now with AKI in the setting of an axillary abscess with antibiotic and NSAID therapy 1.Renal- AKI. I feel is more an infection related acute kidney injury/ analgesic nephropathy/supra therapeutic vancomycin  versus flare of lupus nephritis since  proteinuria is actually improved from the past and that she has no hematuria   Lupus parameters seemingly positive and complements are low- got some solumedrol, now pred.  . Offending renal medications have been stopped, and infection appears to be improving. - making better urine- crt better today  so that is good sign 2. Hypertension/volume  - her tank is full.  stopped IV fluids and now with better UOP edema is improving.  She says edema is better-  BP may rise due to steroids- no BP meds for now as would not want to give ATN 3. Axillary abscess  - has improved with antibiotic therapy. Antibiotics now trimmed to ceftriaxone only 4. Dispo- just from renal standpoint, I would feel comfortable with discharge - follow up  with Dr. Dimas Aguas (her priamry nephrologist)       [place]: Basic Metabolic Panel: Recent Labs  Lab 01/26/17 0444 01/27/17 0648 01/28/17 0503  NA 137 138 139  K 3.9 4.5 3.9  CL 112* 113* 114*  CO2 18* 18* 19*  GLUCOSE 88 159* 94  BUN 35* 43* 44*  CREATININE 4.38*  4.31* 4.39* 3.89*  CALCIUM 7.8* 8.4* 8.5*  PHOS 5.5* 6.4* 5.5*   Liver Function Tests: Recent Labs  Lab 01/23/17 1935 01/24/17 0448 01/25/17 0425 01/26/17 0444 01/27/17 0648 01/28/17 0503  AST 90* 74* 68*  --   --   --   ALT 38 33 35  --   --   --   ALKPHOS 44 37* 31*  --   --   --   BILITOT 0.6 0.8 0.9  --   --   --   PROT 8.9* 7.3 7.0  --   --   --   ALBUMIN 2.9* 2.3* 2.2* 2.2* 2.4* 2.3*   No results for input(s): LIPASE, AMYLASE in the last 168 hours. No results for input(s): AMMONIA in the last 168 hours. CBC: Recent Labs  Lab 01/23/17 1935 01/24/17 0448 01/25/17 0425  WBC 4.8 4.7 4.8  NEUTROABS 3.9  --   --   HGB 10.3* 9.5* 8.7*  HCT 31.4* 29.4* 26.3*  MCV 87.7 88.3 87.7  PLT 268 247 229  Cardiac Enzymes: No results for input(s): CKTOTAL, CKMB, CKMBINDEX, TROPONINI in the last 168 hours. CBG: No results for input(s): GLUCAP in the last 168 hours.  Iron Studies: No results for input(s): IRON, TIBC, TRANSFERRIN, FERRITIN in the last 72 hours. Studies/Results: No results found. Medications: Infusions: . cefTRIAXone (ROCEPHIN)  IV Stopped (01/27/17 2125)    Scheduled Medications: . doxycycline  100 mg Oral Q12H  . enoxaparin (LOVENOX) injection  40 mg Subcutaneous Q24H  . predniSONE  40 mg Oral Q breakfast    have reviewed scheduled and prn medications.  Physical Exam: General: so pleasant and nontoxic appearing Heart: tachy Lungs: clear Abdomen: soft, non tender Extremities: pitting edema- diffuse rash on extremities - both better     01/28/2017,12:14 PM  LOS: 5 days

## 2017-01-28 NOTE — Discharge Summary (Signed)
Physician Discharge Summary  Anne Robinson:[id number] DOB: 1990-04-06 DOA: 01/23/2017  PCP: Karleen Hampshire., MD  Admit date: 01/23/2017 Discharge date: 01/28/2017  Admitted From: Home Disposition: Home   Recommendations for Outpatient Follow-up:  1. Follow up with PCP in 1-2 weeks 2. Please obtain BMP/CBC in one week 3. Follow up with rheumatology in the next week.  Home Health: None Equipment/Devices: None Discharge Condition: Stable CODE STATUS: Full Diet recommendation: Regular  Brief/Interim Summary: Anne Ryser Scottis a 26yo female with a history of SLE no longer on therapy who presented to the ED for left axillary abscess that had not improved despite a course of doxycycline and bactrim as an outpatient. She also reported an unbothersome rash on her legs for the past week, told by her dermatologist to be related to lupus. On arrival, she was febrile to 103F and tachycardic, admitted for IV antibiotics. Surgery was consulted, though the abscess had drained spontaneously, U/S without fluid collection. Vancomycin was started initially but stopped with AKI that developed after admission. Cr 0.8 up to 3.3 with associated edema, though proteinuria was insignificant initially, improved from prior, and ultimately resolved. Vancomycin and NSAIDs stopped, nephrology consulted. Complement and dsDNA were sent and ultimately showed very low complement and markedly elevated dsDNA. IV methylprednisolone was given with improvement in edema, and later transitioned to prednisone after conferring with the patient's rheumatologist by phone. Renal function has begun to recover, urine output is good. Ceftriaxone was started with improvement in cellulitis and defervescence.    Discharge Diagnoses:  Principal Problem:   Abscess of left axilla Active Problems:   Sepsis ([place])   HTN (hypertension)   SLE (systemic lupus erythematosus) (HCC)   Tachypnea   Dyspnea   Macular rash   Acute renal failure  (HCC)   Exacerbation of systemic lupus erythematosus (HCC)   Livedo reticularis   Cellulitis of chest wall  Left axilla abscess and cellulitis: Spontaneously ruptured with no residual abscess on exam or U/S. Has been evaluated by general surgery, no indication for I&D or debridement. - Continue antibiotics. Stopped vancomycin due to concern for nephrotoxicity. Continued ceftriaxone, converted to keflex at discharge at renal dosing. - Monitor blood cultures from 11/19, NGTD at discharge.  Acute renal failure: With supratherapeutic vancomycin trough, NSAID use, and infection. Pr:Cr checked, 0.7, actually improving from prior, no hematuria. Renal U/S with enlarged kidneys, increased echogenicity, and no hydronephrosis. - UOP improving, SCr plateau on 11/23 at 4.39 and improved on 11/24 to 3.89. Will need follow up labs. - Consulted nephrology, Dr. Moshe Cipro, who will get in touch with pt's nephrologist, Dr. Dimas Aguas.   SLE with acute exacerbation: No longer on cellcept, plaquenil, prednisone for unclear reasons. Followed by Dr. Gerilyn Nestle every 6 months since inflammatory markers have improved. ESR, CRP elevated, ds DNA very high (>300) complement very low (C3 is 19, C4 is undetectable <2).  - Discussed with patient's rheumatologist by phone. Solumedrol 221m IV started 11/22. Plan is to taper to prednisone 47mdaily. Her rheumatologist's office will call her on Monday to schedule follow up within 1 week.  Edema/rash: Diffusely, suspect related to SLE. Improving. - Continue to monitor, no indication to augment diuresis at this time.   Dyspnea: Resolved. Possibly related to volume overload, borderline cardiomegaly on CXR, vascular congestion. BNP not impressively elevated and echocardiogram showing only mild diastolic dysfunction.  Discharge Instructions Discharge Instructions    Call MD for:  persistant nausea and vomiting   Complete by:  As directed  Call MD for:  redness,  tenderness, or signs of infection (pain, swelling, redness, odor or green/yellow discharge around incision site)   Complete by:  As directed    Call MD for:  temperature >100.4   Complete by:  As directed    Discharge instructions   Complete by:  As directed    You were admitted for a skin infection which has improved. You will continue antibiotics (keflex three times daily for 7 days) to treat this. Keep the wound covered.   You developed kidney failure which has stabilized and started improving. There was evidence of a lupus flare which seems to be improving on steroids. You will continue prednisone 106m daily until you follow up with Dr. ZEarnest Conroy If you don't hear from the office by Monday afternoon, call to schedule an appointment. You will also need to follow up with your nephrologist and get recheck of kidney function. Avoid NSAIDs completely (including ibuprofen, advil, motrin, aleve, naproxen, excedrin, etc.) as these can worsen kidney problems.   If your symptoms worsen, seek medical attention.     Allergies as of 01/28/2017      Reactions   Lisinopril Swelling      Medication List    STOP taking these medications   ibuprofen 800 MG tablet Commonly known as:  ADVIL,MOTRIN   sulfamethoxazole-trimethoprim 800-160 MG tablet Commonly known as:  BACTRIM DS,SEPTRA DS     TAKE these medications   cephALEXin 250 MG capsule Commonly known as:  KEFLEX Take 1 capsule (250 mg total) by mouth 3 (three) times daily.   fluconazole 150 MG tablet Commonly known as:  DIFLUCAN Take 150 mg daily as needed by mouth (yeast infection).   loratadine 10 MG tablet Commonly known as:  CLARITIN Take 10 mg by mouth daily as needed for allergies.   ondansetron 4 MG tablet Commonly known as:  ZOFRAN Take 1 tablet (4 mg total) by mouth every 6 (six) hours as needed for nausea.   predniSONE 20 MG tablet Commonly known as:  DELTASONE Take 2 tablets (40 mg total) by mouth daily with breakfast.       Follow-up Information    FKarleen Hampshire, MD Follow up.   Specialty:  Internal Medicine Contact information: [phone]       IDimas Aguas MD. Schedule an appointment as soon as possible for a visit in 1 week(s).   Specialty:  Internal Medicine Contact information: [address] [phone]       ZHermelinda Medicus MD. Schedule an appointment as soon as possible for a visit.   Specialty:  Internal Medicine Contact information: [address] [phone]         Allergies  Allergen Reactions  . Lisinopril Swelling    Consultations:  Nephrology, Dr. GLouie Boston     Procedures/Studies: Dg Chest 2 View  Result Date: 01/23/2017 CLINICAL DATA:  Dyspnea with tachycardia. EXAM: CHEST  2 VIEW COMPARISON:  None. FINDINGS: Slightly low lung volumes with pulmonary vascular congestion and borderline cardiomegaly. No aortic aneurysm. Slight obscuration of the costophrenic angles may be secondary to trace pleural effusions. No acute osseous abnormality. IMPRESSION: Mild pulmonary vascular congestion and borderline cardiomegaly. Obscuration of the costophrenic angles are suspicious for small pleural effusions. Electronically Signed   By: DAshley RoyaltyM.D.   On: 01/23/2017 20:12   [place]  Result Date: 01/25/2017 CLINICAL  DATA:  Acute renal failure EXAM: RENAL / URINARY TRACT ULTRASOUND COMPLETE COMPARISON:  None. FINDINGS: Right Kidney: Length: 13.5 cm. Increased echotexture throughout the left kidney. No hydronephrosis or mass. Left Kidney: Length: 14.1 cm. Increased echotexture throughout the left kidney. No hydronephrosis or mass. Bladder: Appears normal for degree of bladder distention. IMPRESSION: Increased echotexture within the kidneys bilaterally. No hydronephrosis. Electronically Signed   By: Rolm Baptise M.D.   On: 01/25/2017 09:36   [place]  Chest Soft Tissue  Result Date: 01/24/2017 CLINICAL DATA:  Cellulitis. Fever. Possible abscess in the left axilla. EXAM: ULTRASOUND OF Left AXILLARY SOFT TISSUES TECHNIQUE: Ultrasound examination of the axillary soft tissues was performed in the area of clinical concern. COMPARISON:  None. FINDINGS: Ultrasound was performed in the inferior left axilla/ lateral chest wall in the area of clinical concern. There is diffuse soft tissue edema. Minimal superficial fluid was noted just deep to a wound, however no organized fluid collection was seen to indicate abscess. Two enlarged lymph nodes measure 4.4 x 1.9 x 2.4 cm and 4.4 x 1.5 x 2.8 cm, both with cortical thickening but still with visible fatty hila and likely reactive in etiology. IMPRESSION: Left axillary soft tissue edema consistent with cellulitis. No evidence of abscess. Electronically Signed   By: Logan Bores M.D.   On: 01/24/2017 19:49    Echocardiogram 01/26/2017: - Left ventricle: The cavity size was normal. Wall thickness was increased in a pattern of mild LVH. Systolic function was normal. The estimated ejection fraction was in the range of 60% to 65%. Wall motion was normal; there were no regional wall motion abnormalities. Doppler parameters are consistent with abnormal left ventricular relaxation (grade 1 diastolic dysfunction). - Aortic valve: Valve area (VTI): 1.57 cm^2. Valve area (Vmax): 1.48 cm^2. Valve area (Vmean): 1.55 cm^2. - Left atrium: The atrium was mildly dilated. - Pericardium, extracardiac: A trivial pericardial effusion was identified.  Impressions: - Normal LV systolic function; mild LVH; mild diastolic dysfunction; mild LAE; trivial pericardial effusion.  Subjective: No complaints. Swelling continues to go down. Now completely gone in LE's, hand with improved ROM and no pain. Left axilla wound is stable, not bothersome.  Discharge Exam: Vitals:   01/28/17 0413 01/28/17 1412  BP: (!)  159/99 (!) 172/98  Pulse: 90 69  Resp: (!) 22 20  Temp: 98.5 F (36.9 C) 98.2 F (36.8 C)  SpO2: 99% 100%   General: Pleasant, obese female in no distress Cardiovascular: RRR, S1/S2 +, no rubs, no gallops Respiratory: CTA bilaterally, no wheezing, no rhonchi Abdominal: Soft, NT, ND, bowel sounds + Ext: Diffusely edematous upper extremity worst in hands but improved from prior exams, and resolved edema in LE's  Skin: Left axilla with focal tender induration without fluctuance, open superficial wound is stable without purulence. 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  Labs: BNP (last 3 results) Recent Labs    01/24/17 1328  BNP 462.7*   Basic Metabolic Panel: Recent Labs  Lab 01/24/17 0448 01/25/17 0425 01/26/17 0444 01/27/17 0648 01/28/17 0503  NA 139 136 137 138 139  K 4.0 4.1 3.9 4.5 3.9  CL 113* 113* 112* 113* 114*  CO2 20* 19* 18* 18* 19*  GLUCOSE 122* 91 88 159* 94  BUN 16 29* 35* 43* 44*  CREATININE 1.19* 3.32* 4.38*  4.31* 4.39* 3.89*  CALCIUM 7.6* 7.3* 7.8* 8.4* 8.5*  PHOS  --   --  5.5* 6.4* 5.5*   Liver Function Tests: Recent  Labs  Lab 01/23/17 1935 01/24/17 0448 01/25/17 0425 01/26/17 0444 01/27/17 0648 01/28/17 0503  AST 90* 74* 68*  --   --   --   ALT 38 33 35  --   --   --   ALKPHOS 44 37* 31*  --   --   --   BILITOT 0.6 0.8 0.9  --   --   --   PROT 8.9* 7.3 7.0  --   --   --   ALBUMIN 2.9* 2.3* 2.2* 2.2* 2.4* 2.3*   No results for input(s): LIPASE, AMYLASE in the last 168 hours. No results for input(s): AMMONIA in the last 168 hours. CBC: Recent Labs  Lab 01/23/17 1935 01/24/17 0448 01/25/17 0425  WBC 4.8 4.7 4.8  NEUTROABS 3.9  --   --   HGB 10.3* 9.5* 8.7*  HCT 31.4* 29.4* 26.3*  MCV 87.7 88.3 87.7  PLT 268 247 229   Cardiac Enzymes: No results for input(s): CKTOTAL, CKMB, CKMBINDEX, TROPONINI in the last 168 hours. BNP: Invalid input(s): POCBNP CBG: No results for input(s): GLUCAP in the  last 168 hours. D-Dimer No results for input(s): DDIMER in the last 72 hours. Hgb A1c No results for input(s): HGBA1C in the last 72 hours. Lipid Profile No results for input(s): CHOL, HDL, LDLCALC, TRIG, CHOLHDL, LDLDIRECT in the last 72 hours. Thyroid function studies No results for input(s): TSH, T4TOTAL, T3FREE, THYROIDAB in the last 72 hours.  Invalid input(s): FREET3 Anemia work up No results for input(s): VITAMINB12, FOLATE, FERRITIN, TIBC, IRON, RETICCTPCT in the last 72 hours. Urinalysis    Component Value Date/Time   COLORURINE STRAW (A) 01/25/2017 0222   APPEARANCEUR CLEAR 01/25/2017 0222   LABSPEC 1.008 01/25/2017 0222   PHURINE 6.0 01/25/2017 0222   GLUCOSEU NEGATIVE 01/25/2017 0222   HGBUR MODERATE (A) 01/25/2017 0222   BILIRUBINUR NEGATIVE 01/25/2017 0222   KETONESUR NEGATIVE 01/25/2017 0222   PROTEINUR NEGATIVE 01/25/2017 0222   UROBILINOGEN 1.0 10/09/2011 1941   NITRITE NEGATIVE 01/25/2017 0222   LEUKOCYTESUR NEGATIVE 01/25/2017 0222    Microbiology Recent Results (from the past 240 hour(s))  Blood culture (routine x 2)     Status: None   Collection Time: 01/23/17  7:35 PM  Result Value Ref Range Status   Specimen Description BLOOD RIGHT ANTECUBITAL  Final   Special Requests   Final    BOTTLES DRAWN AEROBIC AND ANAEROBIC Blood Culture adequate volume   Culture   Final    NO GROWTH 5 DAYS Performed at [facility], [address] [place], [place] [address]    Report Status 01/28/2017 FINAL  Final  Blood culture (routine x 2)     Status: None   Collection Time: 01/23/17  7:49 PM  Result Value Ref Range Status   Specimen Description BLOOD BLOOD RIGHT HAND  Final   Special Requests   Final    IN PEDIATRIC BOTTLE Blood Culture results may not be optimal due to an inadequate volume of blood received in culture bottles   Culture   Final    NO GROWTH 5 DAYS Performed at [facility], [place] [address], [place], [place] [address]    Report Status  01/28/2017 FINAL  Final    Time coordinating discharge: Approximately 40 minutes  Vance Gather, MD  [facility] 01/28/2017, 3:36 PM Pager [phone]

## 2017-03-15 ENCOUNTER — Emergency Department (HOSPITAL_BASED_OUTPATIENT_CLINIC_OR_DEPARTMENT_OTHER): Payer: 59

## 2017-03-15 ENCOUNTER — Other Ambulatory Visit: Payer: Self-pay

## 2017-03-15 ENCOUNTER — Emergency Department (HOSPITAL_BASED_OUTPATIENT_CLINIC_OR_DEPARTMENT_OTHER)
Admission: EM | Admit: 2017-03-15 | Discharge: 2017-03-16 | Disposition: A | Discharge status: Home / Self Care | Payer: 59 | Attending: Emergency Medicine | Admitting: Emergency Medicine

## 2017-03-15 ENCOUNTER — Encounter (HOSPITAL_BASED_OUTPATIENT_CLINIC_OR_DEPARTMENT_OTHER): Payer: Self-pay | Admitting: *Deleted

## 2017-03-15 DIAGNOSIS — Z79899 Other long term (current) drug therapy: Secondary | ICD-10-CM | POA: Diagnosis not present

## 2017-03-15 DIAGNOSIS — R51 Headache: Secondary | ICD-10-CM | POA: Insufficient documentation

## 2017-03-15 DIAGNOSIS — H53149 Visual discomfort, unspecified: Secondary | ICD-10-CM | POA: Insufficient documentation

## 2017-03-15 DIAGNOSIS — I10 Essential (primary) hypertension: Secondary | ICD-10-CM | POA: Diagnosis not present

## 2017-03-15 DIAGNOSIS — H538 Other visual disturbances: Secondary | ICD-10-CM | POA: Insufficient documentation

## 2017-03-15 DIAGNOSIS — R519 Headache, unspecified: Secondary | ICD-10-CM

## 2017-03-15 DIAGNOSIS — M542 Cervicalgia: Secondary | ICD-10-CM | POA: Insufficient documentation

## 2017-03-15 LAB — CBC WITH DIFFERENTIAL/PLATELET
BASOS PCT: 0 %
Basophils Absolute: 0 10*3/uL (ref 0.0–0.1)
Eosinophils Absolute: 0 10*3/uL (ref 0.0–0.7)
Eosinophils Relative: 0 %
HEMATOCRIT: 32.3 % — AB (ref 36.0–46.0)
Hemoglobin: 10.5 g/dL — ABNORMAL LOW (ref 12.0–15.0)
Lymphocytes Relative: 9 %
Lymphs Abs: 0.7 10*3/uL (ref 0.7–4.0)
MCH: 29.3 pg (ref 26.0–34.0)
MCHC: 32.5 g/dL (ref 30.0–36.0)
MCV: 90.2 fL (ref 78.0–100.0)
MONO ABS: 0.2 10*3/uL (ref 0.1–1.0)
MONOS PCT: 3 %
NEUTROS ABS: 6.6 10*3/uL (ref 1.7–7.7)
Neutrophils Relative %: 88 %
Platelets: 389 10*3/uL (ref 150–400)
RBC: 3.58 MIL/uL — ABNORMAL LOW (ref 3.87–5.11)
RDW: 15.6 % — ABNORMAL HIGH (ref 11.5–15.5)
WBC: 7.5 10*3/uL (ref 4.0–10.5)

## 2017-03-15 LAB — BASIC METABOLIC PANEL
Anion gap: 9 (ref 5–15)
BUN: 13 mg/dL (ref 6–20)
CHLORIDE: 103 mmol/L (ref 101–111)
CO2: 20 mmol/L — AB (ref 22–32)
CREATININE: 0.81 mg/dL (ref 0.44–1.00)
Calcium: 9.6 mg/dL (ref 8.9–10.3)
GFR calc Af Amer: >60 mL/min (ref >60)
GFR calc non Af Amer: >60 mL/min (ref >60)
GLUCOSE: 121 mg/dL — AB (ref 65–99)
Potassium: 4.3 mmol/L (ref 3.5–5.1)
SODIUM: 132 mmol/L — AB (ref 135–145)

## 2017-03-15 LAB — PREGNANCY, URINE: Preg Test, Ur: NEGATIVE

## 2017-03-15 MED ORDER — KETOROLAC TROMETHAMINE 15 MG/ML IJ SOLN
15.0000 mg | Freq: Once | INTRAMUSCULAR | Status: AC
  Administered 2017-03-15: 15 mg via INTRAVENOUS
  Filled 2017-03-15: qty 1

## 2017-03-15 MED ORDER — METOCLOPRAMIDE HCL 5 MG/ML IJ SOLN
10.0000 mg | Freq: Once | INTRAMUSCULAR | Status: AC
  Administered 2017-03-15: 10 mg via INTRAVENOUS
  Filled 2017-03-15: qty 2

## 2017-03-15 MED ORDER — METOCLOPRAMIDE HCL 10 MG PO TABS: 10.0000 mg | ORAL_TABLET | Freq: Three times a day (TID) | ORAL | 0 refills | Status: DC | PRN

## 2017-03-15 MED ORDER — SODIUM CHLORIDE 0.9 % IV BOLUS (SEPSIS)
1000.0000 mL | Freq: Once | INTRAVENOUS | Status: AC
  Administered 2017-03-15: 1000 mL via INTRAVENOUS

## 2017-03-15 MED ORDER — DIPHENHYDRAMINE HCL 25 MG PO CAPS
50.0000 mg | ORAL_CAPSULE | Freq: Once | ORAL | Status: AC
  Administered 2017-03-15: 50 mg via ORAL
  Filled 2017-03-15: qty 2

## 2017-03-15 NOTE — ED Notes (Signed)
C/o rt side ha x 2 weeks  w blurred vision  Went to eye md  Dx w swollen optic nerv started on acetazolamide  Has hd 3 caps.  Still having pain

## 2017-03-15 NOTE — Discharge Instructions (Signed)
If you develop worsening headache, new or changing blurry vision or double vision, vomiting, or new or other concerning symptoms, return to the ER immediately.  See your doctor in 2 days.  If your symptoms are not improved you may need a lumbar puncture.  We discussed doing this tonight and the current plan is to defer and see if the medication helps.  However if your blurry vision is not improving over the next couple days you need to come back to the ER for further evaluation.

## 2017-03-15 NOTE — ED Triage Notes (Signed)
Pt c/o h/a x 4 days

## 2017-03-15 NOTE — ED Provider Notes (Addendum)
[name] EMERGENCY DEPARTMENT Provider Note   CSN: [id number] Arrival date & time: 03/15/17  1727     History   Chief Complaint Chief Complaint  Patient presents with  . Headache    HPI Anne Robinson is a 27 y.o. female.  HPI  27 year old female with a history of hypertension and lupus presents with headache.  Headache has been progressive for about 2 weeks.  Started off mild and has progressively worsened.  It is focused around her right eye and forehead.  Radiates to the neck.  No fevers or neck stiffness.  She has had blurry vision that has started office spots in her vision and has progressed over the course of the 2 weeks as well. No double vision. She has been hearing a whooshing in her right ear. No vomiting.  No weakness or numbness.  Saw her ophthalmologist a couple days ago and was prescribed acetazolamide which she has started taking last night but has not noticed a significant difference. has tried Tylenol and ibuprofen.  Headache is currently a 9/10.  Has been referred to a neurologist and is scheduled to get an MRI as an outpatient but neither of these have happened yet.  Past Medical History:  Diagnosis Date  . HTN (hypertension) 05/15/2016  . Lupus   . Lupus nephritis ([place])   . Renal disorder     Patient Active Problem List   Diagnosis Date Noted  . Cellulitis of chest wall   . Acute renal failure ([place]) 01/26/2017  . Exacerbation of systemic lupus erythematosus ([place]) 01/26/2017  . 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 Tachypnea 01/24/2017  . Dyspnea 01/24/2017  . Macular rash 01/24/2017  . Abscess of left axilla 01/24/2017  . Abscess of axilla, left 01/23/2017  . Hypotension   . Sepsis ([place]) 05/15/2016  . Elevated troponin 05/15/2016  . HTN (hypertension) 05/15/2016  . SLE (systemic lupus erythematosus) ([place]) 05/15/2016    Past Surgical History:  Procedure Laterality Date  . RENAL BIOPSY      OB History    No data available       Home  Medications    Prior to Admission medications   Medication Sig Start Date End Date Taking? Authorizing Provider  fluconazole (DIFLUCAN) 150 MG tablet Take 150 mg daily as needed by mouth (yeast infection).  01/11/17   [provider]  loratadine (CLARITIN) 10 MG tablet Take 10 mg by mouth daily as needed for allergies.     [provider]  metoCLOPramide (REGLAN) 10 MG tablet Take 1 tablet (10 mg total) by mouth every 8 (eight) hours as needed for nausea (nausea/headache). 03/15/17   Sherwood Gambler, MD  ondansetron (ZOFRAN) 4 MG tablet Take 1 tablet (4 mg total) by mouth every 6 (six) hours as needed for nausea. 01/28/17   Patrecia Pour, MD  predniSONE (DELTASONE) 20 MG tablet Take 2 tablets (40 mg total) by mouth daily with breakfast. 01/29/17   Patrecia Pour, MD    Family History History reviewed. No pertinent family history.  Social History Social History   Tobacco Use  . Smoking status: Never Smoker  . Smokeless tobacco: Never Used  Substance Use Topics  . Alcohol use: No  . Drug use: No     Allergies   Lisinopril   Review of Systems Review of Systems  Constitutional: Negative for fever.  Eyes: Positive for photophobia and visual disturbance.  Gastrointestinal: Negative for nausea and vomiting.  Musculoskeletal: Positive for neck pain. Negative  for neck stiffness.  Neurological: Positive for headaches.  All other systems reviewed and are negative.    Physical Exam Updated Vital Signs BP 126/78 (BP Location: Right Arm)   Pulse 89   Temp 98.5 F (36.9 C) (Oral)   Resp 18   Ht 4\' 11"  (1.499 m)   Wt 115.7 kg (255 lb)   SpO2 100%   BMI 51.50 kg/m   Physical Exam  Constitutional: She is oriented to person, place, and time. She appears well-developed and well-nourished.  Non-toxic appearance. She does not appear ill. No distress.  obese  HENT:  Head: Normocephalic and atraumatic.  Right Ear: External ear normal.  Left Ear: External ear normal.    Nose: Nose normal.  Eyes: EOM are normal. Pupils are equal, round, and reactive to light. Right eye exhibits no discharge. Left eye exhibits no discharge.  photophobia  Neck: Normal range of motion. Neck supple.  Cardiovascular: Normal rate, regular rhythm and normal heart sounds.  Pulmonary/Chest: Effort normal and breath sounds normal.  Abdominal: Soft. There is no tenderness.  Neurological: She is alert and oriented to person, place, and time.  CN 3-12 grossly intact. 5/5 strength in all 4 extremities. Grossly normal sensation. Normal finger to nose.   Skin: Skin is warm and dry.  Nursing note and vitals reviewed.    ED Treatments / Results  Labs (all labs ordered are listed, but only abnormal results are displayed) Labs Reviewed  CBC WITH DIFFERENTIAL/PLATELET - Abnormal; Notable for the following components:      Result Value   RBC 3.58 (*)    Hemoglobin 10.5 (*)    HCT 32.3 (*)    RDW 15.6 (*)    All other components within normal limits  BASIC METABOLIC PANEL - Abnormal; Notable for the following components:   Sodium 132 (*)    CO2 20 (*)    Glucose, Bld 121 (*)    All other components within normal limits  PREGNANCY, URINE    EKG  EKG Interpretation None       Radiology Ct Head Wo Contrast  Result Date: 03/15/2017 CLINICAL DATA:  27 year old female with right-sided headache and blurred vision. Diplopia. EXAM: CT HEAD WITHOUT CONTRAST TECHNIQUE: Contiguous axial images were obtained from the base of the skull through the vertex without intravenous contrast. COMPARISON:  None. FINDINGS: Brain: No evidence of acute infarction, hemorrhage, hydrocephalus, extra-axial collection or mass lesion/mass effect. Vascular: No hyperdense vessel or unexpected calcification. Skull: Normal. Negative for fracture or focal lesion. Sinuses/Orbits: No acute finding. Other: Nodular densities in the subcutaneous soft tissues anterior to the external auditory canals bilaterally measure  1.3 x 1.3 cm on the right. This can be further evaluated with ultrasound. IMPRESSION: 1. Unremarkable noncontrast CT of the brain. 2. Bilateral anterior auricular subcutaneous nodules, right larger than left. Nonemergent ultrasound may provide better evaluation. Electronically Signed   By: Anner Crete M.D.   On: 03/15/2017 23:34    Procedures Procedures (including critical care time)  Medications Ordered in ED Medications  ketorolac (TORADOL) 15 MG/ML injection 15 mg (not administered)  sodium chloride 0.9 % bolus 1,000 mL (1,000 mLs Intravenous New Bag/Given 03/15/17 2248)  metoCLOPramide (REGLAN) injection 10 mg (10 mg Intravenous Given 03/15/17 2254)  diphenhydrAMINE (BENADRYL) capsule 50 mg (50 mg Oral Given 03/15/17 2254)     Initial Impression / Assessment and Plan / ED Course  I have reviewed the triage vital signs and the nursing notes.  Pertinent labs & imaging results that  were available during my care of the patient were reviewed by me and considered in my medical decision making (see chart for details).     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  Patient has had the progressive symptoms for a couple weeks.  Her neuro exam is unremarkable.  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  CT does not show any other cause of the symptoms.  Lab work is at patient's baseline.  She was given Reglan and fluids and feels good improvement of her headache.  She cannot tell if the blurry vision is better or the same.  We had a long discussion about LP with her family at the bedside.  Discussed risk and benefits, including that it can be diagnostic and therapeutic.  Discussed that if she develops vision loss this could be permanent.  She understands this and at this time given improvement in her headache she wants to wait and see if the acetazolamide kicks in.  We discussed that she  should not wait too long and I suggested in the next day or 2 she should return if not improving.  If her symptoms are worsening at any point she needs to return sooner than that.  She understands this and verbalized return precautions.  She is ambulating normally.  Final Clinical Impressions(s) / ED Diagnoses   Final diagnoses:  Frontal headache    ED Discharge Orders        Ordered    metoCLOPramide (REGLAN) 10 MG tablet  Every 8 hours PRN     03/15/17 2352       Sherwood Gambler, MD 03/15/17 2358    Sherwood Gambler, MD 03/15/17 2359

## 2018-11-25 IMAGING — CR DG CHEST 2V
2 series · 2 of 2 positions shown · non-contrast
Comparison: 06/06/2013

CLINICAL DATA: Fever, difficulty breathing for 2 days

EXAM:
CHEST  2 VIEW

[w chest pa]
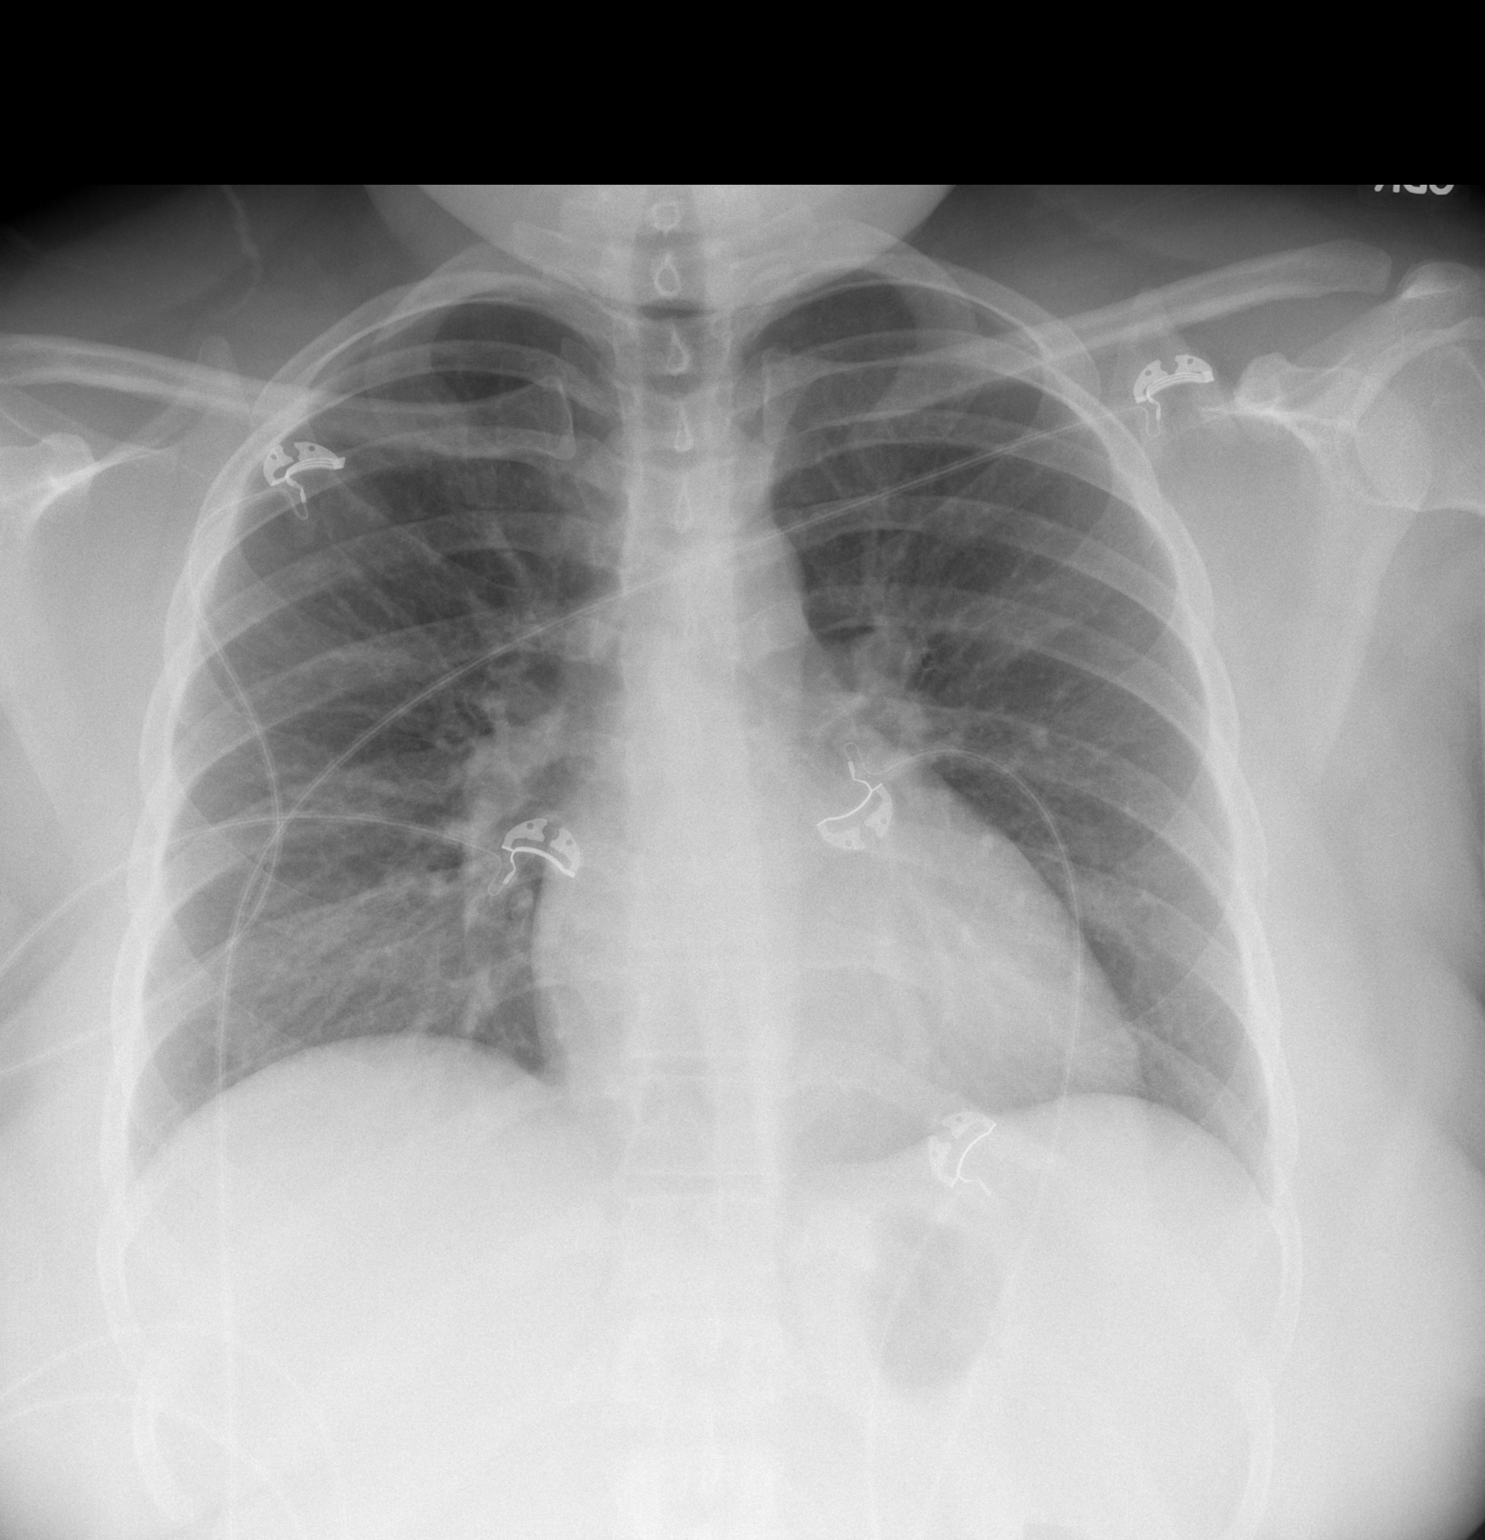

[w chest lat]
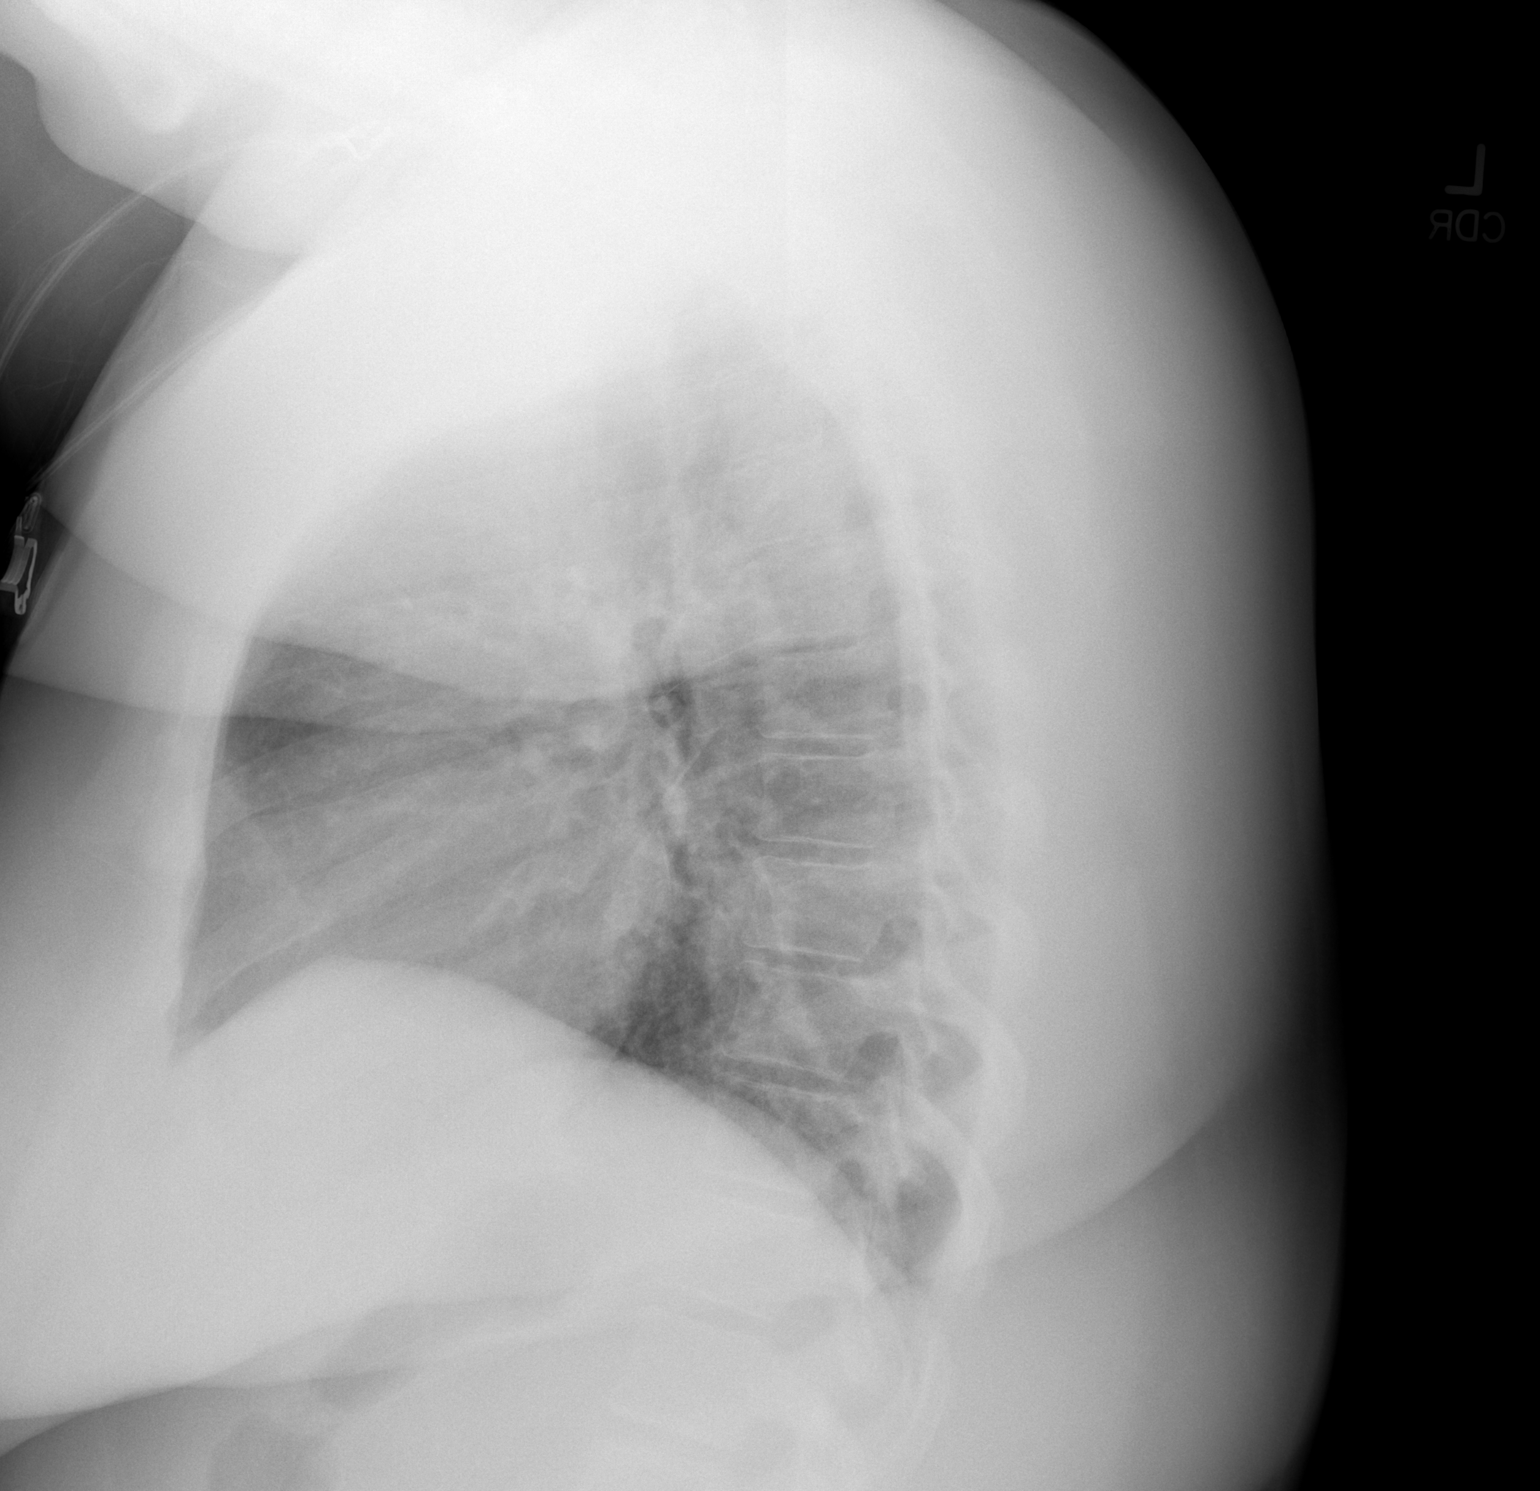

[2 of 2 positions shown; findings below may reference images not displayed]

FINDINGS: The heart size and mediastinal contours are within normal limits.
Both lungs are clear. The visualized skeletal structures are
unremarkable.
IMPRESSION: No active cardiopulmonary disease.

## 2018-11-27 HISTORY — PX: VENTRICULAR ATRIAL SHUNT: SHX2657

## 2019-08-07 ENCOUNTER — Other Ambulatory Visit: Payer: Self-pay

## 2019-08-07 ENCOUNTER — Encounter (HOSPITAL_BASED_OUTPATIENT_CLINIC_OR_DEPARTMENT_OTHER): Payer: Self-pay | Admitting: *Deleted

## 2019-08-07 ENCOUNTER — Emergency Department (HOSPITAL_BASED_OUTPATIENT_CLINIC_OR_DEPARTMENT_OTHER): Payer: 59

## 2019-08-07 ENCOUNTER — Emergency Department (HOSPITAL_BASED_OUTPATIENT_CLINIC_OR_DEPARTMENT_OTHER)
Admission: EM | Admit: 2019-08-07 | Discharge: 2019-08-08 | Disposition: A | Discharge status: Home / Self Care | Payer: 59 | Attending: Emergency Medicine | Admitting: Emergency Medicine

## 2019-08-07 DIAGNOSIS — Z888 Allergy status to other drugs, medicaments and biological substances status: Secondary | ICD-10-CM | POA: Insufficient documentation

## 2019-08-07 DIAGNOSIS — R0789 Other chest pain: Secondary | ICD-10-CM | POA: Insufficient documentation

## 2019-08-07 DIAGNOSIS — Z79899 Other long term (current) drug therapy: Secondary | ICD-10-CM | POA: Insufficient documentation

## 2019-08-07 DIAGNOSIS — M3210 Systemic lupus erythematosus, organ or system involvement unspecified: Secondary | ICD-10-CM | POA: Diagnosis not present

## 2019-08-07 DIAGNOSIS — I10 Essential (primary) hypertension: Secondary | ICD-10-CM | POA: Insufficient documentation

## 2019-08-07 LAB — CBC WITH DIFFERENTIAL/PLATELET
Abs Immature Granulocytes: 0.47 10*3/uL — ABNORMAL HIGH (ref 0.00–0.07)
Basophils Absolute: 0 10*3/uL (ref 0.0–0.1)
Basophils Relative: 1 %
Eosinophils Absolute: 0.1 10*3/uL (ref 0.0–0.5)
Eosinophils Relative: 3 %
HCT: 31.8 % — ABNORMAL LOW (ref 36.0–46.0)
Hemoglobin: 10 g/dL — ABNORMAL LOW (ref 12.0–15.0)
Immature Granulocytes: 11 %
Lymphocytes Relative: 29 %
Lymphs Abs: 1.2 10*3/uL (ref 0.7–4.0)
MCH: 28.3 pg (ref 26.0–34.0)
MCHC: 31.4 g/dL (ref 30.0–36.0)
MCV: 90.1 fL (ref 80.0–100.0)
Monocytes Absolute: 0.3 10*3/uL (ref 0.1–1.0)
Monocytes Relative: 7 %
Neutro Abs: 2.1 10*3/uL (ref 1.7–7.7)
Neutrophils Relative %: 49 %
Platelets: 269 10*3/uL (ref 150–400)
RBC: 3.53 MIL/uL — ABNORMAL LOW (ref 3.87–5.11)
RDW: 16.4 % — ABNORMAL HIGH (ref 11.5–15.5)
Smear Review: NORMAL
WBC: 4.2 10*3/uL (ref 4.0–10.5)
nRBC: 0 % (ref 0.0–0.2)

## 2019-08-07 LAB — COMPREHENSIVE METABOLIC PANEL
ALT: 12 U/L (ref 0–44)
AST: 23 U/L (ref 15–41)
Albumin: 3.5 g/dL (ref 3.5–5.0)
Alkaline Phosphatase: 52 U/L (ref 38–126)
Anion gap: 19 — ABNORMAL HIGH (ref 5–15)
BUN: 41 mg/dL — ABNORMAL HIGH (ref 6–20)
CO2: 23 mmol/L (ref 22–32)
Calcium: 8.2 mg/dL — ABNORMAL LOW (ref 8.9–10.3)
Chloride: 91 mmol/L — ABNORMAL LOW (ref 98–111)
Creatinine, Ser: 19.19 mg/dL — ABNORMAL HIGH (ref 0.44–1.00)
GFR calc Af Amer: 2 mL/min — ABNORMAL LOW (ref >60)
GFR calc non Af Amer: 2 mL/min — ABNORMAL LOW (ref >60)
Glucose, Bld: 99 mg/dL (ref 70–99)
Potassium: 3.3 mmol/L — ABNORMAL LOW (ref 3.5–5.1)
Sodium: 133 mmol/L — ABNORMAL LOW (ref 135–145)
Total Bilirubin: 0.8 mg/dL (ref 0.3–1.2)
Total Protein: 7.5 g/dL (ref 6.5–8.1)

## 2019-08-07 LAB — TROPONIN I (HIGH SENSITIVITY)
Troponin I (High Sensitivity): 9 ng/L (ref <18)
Troponin I (High Sensitivity): 8 ng/L (ref <18)

## 2019-08-07 MED ORDER — IOHEXOL 350 MG/ML SOLN
100.0000 mL | Freq: Once | INTRAVENOUS | Status: AC | PRN
  Administered 2019-08-07: 80 mL via INTRAVENOUS

## 2019-08-07 NOTE — ED Provider Notes (Signed)
[facility] EMERGENCY DEPARTMENT Provider Note   CSN: [id number] Arrival date & time: 08/07/19  2117     History Chief Complaint  Patient presents with  . Chest Pain    Anne Robinson is a 29 y.o. female.  29 yo F with a chief complaints of chest pain.  This is been an off-and-on problem for her.  Started this morning feels like a pressure across her chest.  Seems to come and go at random.  Has some exertional component to it but also positional 1.  Denies trauma to the chest denies cough congestion or fever denies history of MI.  Denies history of PE or DVT.  She was on Eliquis after she had had a clot that was related with her hemodialysis catheter.  The history is provided by the patient.  Chest Pain Pain location:  Substernal area Pain quality: pressure   Pain radiates to:  Does not radiate Pain severity:  Severe Onset quality:  Gradual Duration:  1 day Timing:  Constant Progression:  Worsening Chronicity:  New Relieved by:  Nothing Worsened by:  Nothing Ineffective treatments:  None tried Associated symptoms: shortness of breath   Associated symptoms: no dizziness, no fever, no headache, no nausea, no palpitations and no vomiting        Past Medical History:  Diagnosis Date  . HTN (hypertension) 05/15/2016  . Lupus ([place])   . Lupus nephritis ([place])   . Renal disorder     Patient Active Problem List   Diagnosis Date Noted  . Cellulitis of chest wall   . Acute renal failure ([place]) 01/26/2017  . Exacerbation of systemic lupus erythematosus ([place]) 01/26/2017  . 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 Tachypnea 01/24/2017  . Dyspnea 01/24/2017  . Macular rash 01/24/2017  . Abscess of left axilla 01/24/2017  . Abscess of axilla, left 01/23/2017  . Hypotension   . Sepsis ([place]) 05/15/2016  . Elevated troponin 05/15/2016  . HTN (hypertension) 05/15/2016  . SLE (systemic lupus erythematosus) ([place]) 05/15/2016    Past Surgical History:  Procedure Laterality Date   . RENAL BIOPSY       OB History   No obstetric history on file.     History reviewed. No pertinent family history.  Social History   Tobacco Use  . Smoking status: Never Smoker  . Smokeless tobacco: Never Used  Substance Use Topics  . Alcohol use: No  . Drug use: No    Home Medications Prior to Admission medications   Medication Sig Start Date End Date Taking? Authorizing Provider  amLODipine (NORVASC) 10 MG tablet Take by mouth. 05/10/19  Yes [provider]  EPINEPHrine 0.3 mg/0.3 mL IJ SOAJ injection Inject into the muscle. 07/12/13  Yes [provider]  mycophenolate (CELLCEPT) 500 MG tablet  02/12/18  Yes [provider]  nebivolol (BYSTOLIC) 10 MG tablet  14/43/15  Yes [provider]  predniSONE (DELTASONE) 10 MG tablet Take by mouth. 05/20/19  Yes [provider]  temazepam (RESTORIL) 15 MG capsule Take by mouth. 01/21/19  Yes [provider]  calcium carbonate (OS-CAL) 1250 (500 Ca) MG chewable tablet Chew by mouth.    [provider]  fluconazole (DIFLUCAN) 150 MG tablet Take 150 mg daily as needed by mouth (yeast infection).  01/11/17   [provider]  labetalol (NORMODYNE) 300 MG tablet Take 300 mg by mouth 3 (three) times daily. 06/07/19   [provider]  loratadine (CLARITIN) 10 MG tablet Take 10 mg  by mouth daily as needed for allergies.     [provider]  metoCLOPramide (REGLAN) 10 MG tablet Take 1 tablet (10 mg total) by mouth every 8 (eight) hours as needed for nausea (nausea/headache). 03/15/17   Sherwood Gambler, MD  ondansetron (ZOFRAN) 4 MG tablet Take 1 tablet (4 mg total) by mouth every 6 (six) hours as needed for nausea. 01/28/17   Patrecia Pour, MD  predniSONE (DELTASONE) 20 MG tablet Take 2 tablets (40 mg total) by mouth daily with breakfast. 01/29/17   Patrecia Pour, MD    Allergies    Lisinopril  Review of Systems   Review of Systems  Constitutional: Negative for  chills and fever.  HENT: Negative for congestion and rhinorrhea.   Eyes: Negative for redness and visual disturbance.  Respiratory: Positive for shortness of breath. Negative for wheezing.   Cardiovascular: Positive for chest pain. Negative for palpitations.  Gastrointestinal: Negative for nausea and vomiting.  Genitourinary: Negative for dysuria and urgency.  Musculoskeletal: Negative for arthralgias and myalgias.  Skin: Negative for pallor and wound.  Neurological: Negative for dizziness and headaches.    Physical Exam Updated Vital Signs BP 110/78 (BP Location: Right Arm)   Pulse 89   Temp 98 F (36.7 C)   Resp 20   Ht 4\' 11"  (1.499 m)   LMP 07/09/2019   SpO2 99%   BMI 51.50 kg/m   Physical Exam Vitals and nursing note reviewed.  Constitutional:      General: She is not in acute distress.    Appearance: She is well-developed. She is not diaphoretic.  HENT:     Head: Normocephalic and atraumatic.  Eyes:     Pupils: Pupils are equal, round, and reactive to light.  Cardiovascular:     Rate and Rhythm: Regular rhythm. Tachycardia present.     Heart sounds: No murmur. No friction rub. No gallop.   Pulmonary:     Effort: Pulmonary effort is normal.     Breath sounds: No wheezing or rales.  Chest:     Chest wall: Tenderness (tenderness along the sternal border) present.  Abdominal:     General: There is no distension.     Palpations: Abdomen is soft.     Tenderness: There is no abdominal tenderness.  Musculoskeletal:        General: No tenderness.     Cervical back: Normal range of motion and neck supple.  Skin:    General: Skin is warm and dry.  Neurological:     Mental Status: She is alert and oriented to person, place, and time.  Psychiatric:        Behavior: Behavior normal.     ED Results / Procedures / Treatments   Labs (all labs ordered are listed, but only abnormal results are displayed) Labs Reviewed  CBC WITH DIFFERENTIAL/PLATELET - Abnormal;  Notable for the following components:      Result Value   RBC 3.53 (*)    Hemoglobin 10.0 (*)    HCT 31.8 (*)    RDW 16.4 (*)    Abs Immature Granulocytes 0.47 (*)    All other components within normal limits  COMPREHENSIVE METABOLIC PANEL - Abnormal; Notable for the following components:   Sodium 133 (*)    Potassium 3.3 (*)    Chloride 91 (*)    BUN 41 (*)    Creatinine, Ser 19.19 (*)    Calcium 8.2 (*)    GFR calc non Af Amer 2 (*)  GFR calc Af Amer 2 (*)    Anion gap 19 (*)    All other components within normal limits  TROPONIN I (HIGH SENSITIVITY)  TROPONIN I (HIGH SENSITIVITY)    EKG EKG Interpretation  Date/Time:  Wednesday August 07 2019 21:30:14 EDT Ventricular Rate:  119 PR Interval:    QRS Duration: 79 QT Interval:  365 QTC Calculation: 514 R Axis:   28 Text Interpretation: Sinus tachycardia Borderline T abnormalities, anterior leads Prolonged QT interval Baseline wander in lead(s) V2 No significant change since last tracing Confirmed by Deno Etienne [phone]) on 08/07/2019 9:36:15 PM   Radiology CT Angio Chest PE W and/or Wo Contrast  Result Date: 08/07/2019 CLINICAL DATA:  Chest heaviness EXAM: CT ANGIOGRAPHY CHEST WITH CONTRAST TECHNIQUE: Multidetector CT imaging of the chest was performed using the standard protocol during bolus administration of intravenous contrast. Multiplanar CT image reconstructions and MIPs were obtained to evaluate the vascular anatomy. CONTRAST:  11mL OMNIPAQUE IOHEXOL 350 MG/ML SOLN COMPARISON:  07/19/2019 FINDINGS: Cardiovascular: Thoracic aorta shows no significant atherosclerotic calcifications. No cardiac enlargement is seen. Pulmonary artery shows a normal branching pattern without filling defect to suggest pulmonary embolism. 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 Mediastinum/Nodes: Thoracic inlet is within normal limits. No hilar or mediastinal adenopathy is noted. The  esophagus as visualized is within normal limits. Lungs/Pleura: Lungs are clear. No pleural effusion or pneumothorax. Upper Abdomen: Visualized upper abdomen is within normal limits. Musculoskeletal: No chest wall abnormality. No acute or significant osseous findings. Review of the MIP images confirms the above findings. IMPRESSION: No evidence of pulmonary emboli. 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 No other focal abnormality is noted. Electronically Signed   By: Inez Catalina M.D.   On: 08/07/2019 22:12    Procedures Procedures (including critical care time)  Medications Ordered in ED Medications  iohexol (OMNIPAQUE) 350 MG/ML injection 100 mL (80 mLs Intravenous Contrast Given 08/07/19 2202)    ED Course  I have reviewed the triage vital signs and the nursing notes.  Pertinent labs & imaging results that were available during my care of the patient were reviewed by me and considered in my medical decision making (see chart for details).    MDM Rules/Calculators/A&P                      29 yo F with a chief complaints of chest pain.  Patient has significant pain on exam though she will to agree with me that this is reproduces her symptoms.  She was just in the hospital with what she reports is similar symptoms though looking back at the notes it sounds like they were concerned that her peritoneal dialysis was failing and so she was placed in the hospital.  At that time she just continue peritoneal dialysis and seemed to do better and was discharged home.  Will obtain a CT angiogram of the chest delta troponin lab work reassess.  Patient's lab work is similar to when she was discharged at [facility].  She has a persistent anion gap of unknown etiology.  On her visit she had had a salicylate level, lactate all normal.  Her creatinine is significantly elevated but again is similar to when she was recently discharged.  Troponins negative PE scan is  negative.  Signed out to Dr. Randal Buba, please see their note for further details of care in the ED.  The patients results and plan were reviewed and discussed.   Any x-rays performed were independently reviewed by myself.   Differential diagnosis were considered with the presenting HPI.  Medications  iohexol (OMNIPAQUE) 350 MG/ML injection 100 mL (80 mLs Intravenous Contrast Given 08/07/19 2202)    Vitals:   08/07/19 2125 08/07/19 2132 08/07/19 2321 08/08/19 0005  BP:  127/88 107/82 110/78  Pulse:  (!) 116 90 89  Resp:    20  Temp:  98 F (36.7 C)  98 F (36.7 C)  TempSrc:  Oral    SpO2:  100% 100% 99%  Height: 4\' 11"  (1.499 m)       Final diagnoses:  Chest wall pain      Final Clinical Impression(s) / ED Diagnoses Final diagnoses:  Chest wall pain    Rx / DC Orders ED Discharge Orders    None       Deno Etienne, DO 08/08/19 1837

## 2019-08-07 NOTE — ED Notes (Signed)
ED Provider at bedside. 

## 2019-08-07 NOTE — ED Notes (Signed)
Pt transported to CT ?

## 2019-08-07 NOTE — ED Triage Notes (Signed)
Pt c/o  Mid sternal chest pain, SOB with exertion  x 2 days

## 2019-08-12 ENCOUNTER — Other Ambulatory Visit: Payer: Self-pay

## 2019-08-12 ENCOUNTER — Emergency Department (HOSPITAL_BASED_OUTPATIENT_CLINIC_OR_DEPARTMENT_OTHER)
Admission: EM | Admit: 2019-08-12 | Discharge: 2019-08-12 | Disposition: A | Discharge status: Home / Self Care | Payer: 59 | Attending: Emergency Medicine | Admitting: Emergency Medicine

## 2019-08-12 ENCOUNTER — Encounter (HOSPITAL_BASED_OUTPATIENT_CLINIC_OR_DEPARTMENT_OTHER): Payer: Self-pay

## 2019-08-12 DIAGNOSIS — Z79899 Other long term (current) drug therapy: Secondary | ICD-10-CM | POA: Insufficient documentation

## 2019-08-12 DIAGNOSIS — R079 Chest pain, unspecified: Secondary | ICD-10-CM | POA: Diagnosis present

## 2019-08-12 DIAGNOSIS — R109 Unspecified abdominal pain: Secondary | ICD-10-CM | POA: Insufficient documentation

## 2019-08-12 DIAGNOSIS — R112 Nausea with vomiting, unspecified: Secondary | ICD-10-CM | POA: Diagnosis not present

## 2019-08-12 DIAGNOSIS — Z888 Allergy status to other drugs, medicaments and biological substances status: Secondary | ICD-10-CM | POA: Insufficient documentation

## 2019-08-12 DIAGNOSIS — I10 Essential (primary) hypertension: Secondary | ICD-10-CM | POA: Insufficient documentation

## 2019-08-12 LAB — CBC WITH DIFFERENTIAL/PLATELET
Abs Immature Granulocytes: 0.69 10*3/uL — ABNORMAL HIGH (ref 0.00–0.07)
Basophils Absolute: 0.1 10*3/uL (ref 0.0–0.1)
Basophils Relative: 1 %
Eosinophils Absolute: 0.1 10*3/uL (ref 0.0–0.5)
Eosinophils Relative: 2 %
HCT: 34.9 % — ABNORMAL LOW (ref 36.0–46.0)
Hemoglobin: 10.8 g/dL — ABNORMAL LOW (ref 12.0–15.0)
Immature Granulocytes: 14 %
Lymphocytes Relative: 14 %
Lymphs Abs: 0.7 10*3/uL (ref 0.7–4.0)
MCH: 28.1 pg (ref 26.0–34.0)
MCHC: 30.9 g/dL (ref 30.0–36.0)
MCV: 90.9 fL (ref 80.0–100.0)
Monocytes Absolute: 0.3 10*3/uL (ref 0.1–1.0)
Monocytes Relative: 6 %
Neutro Abs: 3.1 10*3/uL (ref 1.7–7.7)
Neutrophils Relative %: 63 %
Platelets: 258 10*3/uL (ref 150–400)
RBC: 3.84 MIL/uL — ABNORMAL LOW (ref 3.87–5.11)
RDW: 16.9 % — ABNORMAL HIGH (ref 11.5–15.5)
WBC: 5 10*3/uL (ref 4.0–10.5)
nRBC: 0 % (ref 0.0–0.2)

## 2019-08-12 LAB — COMPREHENSIVE METABOLIC PANEL
ALT: 13 U/L (ref 0–44)
AST: 25 U/L (ref 15–41)
Albumin: 3.6 g/dL (ref 3.5–5.0)
Alkaline Phosphatase: 60 U/L (ref 38–126)
Anion gap: 20 — ABNORMAL HIGH (ref 5–15)
BUN: 36 mg/dL — ABNORMAL HIGH (ref 6–20)
CO2: 22 mmol/L (ref 22–32)
Calcium: 7.9 mg/dL — ABNORMAL LOW (ref 8.9–10.3)
Chloride: 91 mmol/L — ABNORMAL LOW (ref 98–111)
Creatinine, Ser: 22.93 mg/dL — ABNORMAL HIGH (ref 0.44–1.00)
GFR calc Af Amer: 2 mL/min — ABNORMAL LOW (ref >60)
GFR calc non Af Amer: 2 mL/min — ABNORMAL LOW (ref >60)
Glucose, Bld: 108 mg/dL — ABNORMAL HIGH (ref 70–99)
Potassium: 3.2 mmol/L — ABNORMAL LOW (ref 3.5–5.1)
Sodium: 133 mmol/L — ABNORMAL LOW (ref 135–145)
Total Bilirubin: 0.5 mg/dL (ref 0.3–1.2)
Total Protein: 7.9 g/dL (ref 6.5–8.1)

## 2019-08-12 LAB — LIPASE, BLOOD: Lipase: 81 U/L — ABNORMAL HIGH (ref 11–51)

## 2019-08-12 MED ORDER — METOCLOPRAMIDE HCL 10 MG PO TABS
10.0000 mg | ORAL_TABLET | Freq: Three times a day (TID) | ORAL | 0 refills | Status: DC | PRN
Start: 2019-08-12 — End: 2019-09-02

## 2019-08-12 MED ORDER — METOCLOPRAMIDE HCL 5 MG/ML IJ SOLN
5.0000 mg | Freq: Once | INTRAMUSCULAR | Status: AC
  Administered 2019-08-12: 5 mg via INTRAVENOUS
  Filled 2019-08-12: qty 2

## 2019-08-12 MED ORDER — SODIUM CHLORIDE 0.9 % IV BOLUS
250.0000 mL | Freq: Once | INTRAVENOUS | Status: AC
  Administered 2019-08-12: 250 mL via INTRAVENOUS

## 2019-08-12 NOTE — ED Triage Notes (Signed)
Pt c/o "chest heaviness", SOB, n/v x 2 months-states she is awaiting a GI study for same c/o-NAD-to triage in w/c

## 2019-08-12 NOTE — ED Provider Notes (Signed)
[facility] EMERGENCY DEPARTMENT Provider Note   CSN: [id number] Arrival date & time: 08/12/19  1131     History Chief Complaint  Patient presents with  . Chest Pain    Anne Robinson is a 29 y.o. female.  29 yo F with a cc of n/v.  This is been an ongoing issue for her.  She has seen a gastroenterologist and they are planning for a EGD next week.  She unfortunately felt that her symptoms have worsened and so came to the ED for evaluation.  Tells me that she has been vomiting about every other night or so.  Worsened over the past few days had a couple episodes yesterday and then a couple of this morning.  Is almost exclusively happen while she is trying to sleep.  She is usually on peritoneal dialysis when it happens.  Tells me that she feels sweaty all over and then feels a bit uncomfortable and then vomits.  She was seen by GI about 5 days ago and told to start omeprazole which she is not taking because she told me that that has not what is wrong with her.  I actually saw her in the ED a week ago for chest discomfort.  She had a negative CT angiogram of the chest and delta troponin.  The history is provided by the patient.  Chest Pain Associated symptoms: abdominal pain, nausea and vomiting   Associated symptoms: no dizziness, no fever, no headache, no palpitations and no shortness of breath   Illness Severity:  Moderate Onset quality:  Gradual Duration:  1 month Timing:  Intermittent Progression:  Worsening Chronicity:  Recurrent Associated symptoms: abdominal pain, chest pain, nausea and vomiting   Associated symptoms: no congestion, no fever, no headaches, no myalgias, no rhinorrhea, no shortness of breath and no wheezing        Past Medical History:  Diagnosis Date  . HTN (hypertension) 05/15/2016  . Lupus ([place])   . Lupus nephritis ([place])   . Renal disorder     Patient Active Problem List   Diagnosis Date Noted  . Cellulitis of chest wall   . Acute renal  failure ([place]) 01/26/2017  . Exacerbation of systemic lupus erythematosus ([place]) 01/26/2017  . 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 Tachypnea 01/24/2017  . Dyspnea 01/24/2017  . Macular rash 01/24/2017  . Abscess of left axilla 01/24/2017  . Abscess of axilla, left 01/23/2017  . Hypotension   . Sepsis ([place]) 05/15/2016  . Elevated troponin 05/15/2016  . HTN (hypertension) 05/15/2016  . SLE (systemic lupus erythematosus) ([place]) 05/15/2016    Past Surgical History:  Procedure Laterality Date  . RENAL BIOPSY       OB History   No obstetric history on file.     No family history on file.  Social History   Tobacco Use  . Smoking status: Never Smoker  . Smokeless tobacco: Never Used  Substance Use Topics  . Alcohol use: No  . Drug use: No    Home Medications Prior to Admission medications   Medication Sig Start Date End Date Taking? Authorizing Provider  amLODipine (NORVASC) 10 MG tablet Take by mouth. 05/10/19   [provider]  calcium carbonate (OS-CAL) 1250 (500 Ca) MG chewable tablet Chew by mouth.    [provider]  EPINEPHrine 0.3 mg/0.3 mL IJ SOAJ injection Inject into the muscle. 07/12/13   [provider]  fluconazole (DIFLUCAN) 150 MG tablet Take 150 mg daily as needed by mouth (  yeast infection).  01/11/17   [provider]  labetalol (NORMODYNE) 300 MG tablet Take 300 mg by mouth 3 (three) times daily. 06/07/19   [provider]  loratadine (CLARITIN) 10 MG tablet Take 10 mg by mouth daily as needed for allergies.     [provider]  metoCLOPramide (REGLAN) 10 MG tablet Take 1 tablet (10 mg total) by mouth every 8 (eight) hours as needed for nausea (nausea/headache). 03/15/17   Sherwood Gambler, MD  metoCLOPramide (REGLAN) 10 MG tablet Take 1 tablet (10 mg total) by mouth every 8 (eight) hours as needed for nausea (nausea/headache). 08/12/19   Deno Etienne, DO  mycophenolate (CELLCEPT) 500 MG tablet  02/12/18   [provider]  nebivolol (BYSTOLIC) 10 MG tablet  22/48/25   [provider]  ondansetron (ZOFRAN) 4 MG tablet Take 1 tablet (4 mg total) by mouth every 6 (six) hours as needed for nausea. 01/28/17   Patrecia Pour, MD  predniSONE (DELTASONE) 10 MG tablet Take by mouth. 05/20/19   [provider]  predniSONE (DELTASONE) 20 MG tablet Take 2 tablets (40 mg total) by mouth daily with breakfast. 01/29/17   Patrecia Pour, MD  temazepam (RESTORIL) 15 MG capsule Take by mouth. 01/21/19   [provider]    Allergies    Lisinopril  Review of Systems   Review of Systems  Constitutional: Negative for chills and fever.  HENT: Negative for congestion and rhinorrhea.   Eyes: Negative for redness and visual disturbance.  Respiratory: Negative for shortness of breath and wheezing.   Cardiovascular: Positive for chest pain. Negative for palpitations.  Gastrointestinal: Positive for abdominal pain, nausea and vomiting.  Genitourinary: Negative for dysuria and urgency.  Musculoskeletal: Negative for arthralgias and myalgias.  Skin: Negative for pallor and wound.  Neurological: Negative for dizziness and headaches.    Physical Exam Updated Vital Signs BP 122/87 (BP Location: Right Arm)   Pulse (!) 113   Temp 98.8 F (37.1 C) (Oral)   Resp 20   LMP 07/11/2019   SpO2 100%   Physical Exam Vitals and nursing note reviewed.  Constitutional:      General: She is not in acute distress.    Appearance: She is well-developed. She is not diaphoretic.  HENT:     Head: Normocephalic and atraumatic.  Eyes:     Pupils: Pupils are equal, round, and reactive to light.  Cardiovascular:     Rate and Rhythm: Normal rate and regular rhythm.     Heart sounds: No murmur. No friction rub. No gallop.   Pulmonary:     Effort: Pulmonary effort is normal.     Breath sounds: No wheezing or rales.  Abdominal:     General: There is no distension.     Palpations: Abdomen is soft.      Tenderness: There is no abdominal tenderness.     Comments: Benign abdominal exam  Musculoskeletal:        General: No tenderness.     Cervical back: Normal range of motion and neck supple.  Skin:    General: Skin is warm and dry.  Neurological:     Mental Status: She is alert and oriented to person, place, and time.  Psychiatric:        Behavior: Behavior normal.     ED Results / Procedures / Treatments   Labs (all labs ordered are listed, but only abnormal results are displayed) Labs Reviewed  CBC WITH DIFFERENTIAL/PLATELET - Abnormal;  Notable for the following components:      Result Value   RBC 3.84 (*)    Hemoglobin 10.8 (*)    HCT 34.9 (*)    RDW 16.9 (*)    Abs Immature Granulocytes 0.69 (*)    All other components within normal limits  COMPREHENSIVE METABOLIC PANEL - Abnormal; Notable for the following components:   Sodium 133 (*)    Potassium 3.2 (*)    Chloride 91 (*)    Glucose, Bld 108 (*)    BUN 36 (*)    Creatinine, Ser 22.93 (*)    Calcium 7.9 (*)    GFR calc non Af Amer 2 (*)    GFR calc Af Amer 2 (*)    Anion gap 20 (*)    All other components within normal limits  LIPASE, BLOOD - Abnormal; Notable for the following components:   Lipase 81 (*)    All other components within normal limits  PATHOLOGIST SMEAR REVIEW    EKG None  Radiology No results found.  Procedures Procedures (including critical care time)  Medications Ordered in ED Medications  sodium chloride 0.9 % bolus 250 mL (250 mLs Intravenous New Bag/Given 08/12/19 1207)  metoCLOPramide (REGLAN) injection 5 mg (5 mg Intravenous Given 08/12/19 1208)    ED Course  I have reviewed the triage vital signs and the nursing notes.  Pertinent labs & imaging results that were available during my care of the patient were reviewed by me and considered in my medical decision making (see chart for details).    MDM Rules/Calculators/A&P                      29 yo F with a chief complaint of  chest pain though the patient is really complaining of recurrent nausea and vomiting.  She was seen less than a week ago and had a negative delta troponin and negative PET scan and her pain was more likely musculoskeletal.  Do not feel that I need to work that up any further.  I will do a laboratory evaluation to assess for pancreatitis or hepatitis or electrolyte abnormality.  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  We will give a small dose of Reglan here and consider starting her on Reglan as an outpatient while she awaits her EGD.  We will have her call her gastroenterologist after discharge here if able.  Lab work is similar to her prior.  No significant uremia, no hyperkalemia.  Persistent elevated anion gap.  Her lipase is mildly elevated but she has no significant epigastric pain.    Patient feeling better after Reglan.  We will do a outpatient prescription for the same.  Have her call her GI doctor and discuss her visit here.  12:59 PM:  I have discussed the diagnosis/risks/treatment options with the patient and family and believe the pt to be eligible for discharge home to follow-up with GI. We also discussed returning to the ED immediately if new or worsening sx occur. We discussed the sx which are most concerning (e.g., sudden worsening pain, fever, inability to tolerate by mouth) that necessitate immediate return. Medications administered to the patient during their visit and any new prescriptions provided to the patient are listed below.  Medications given during this visit Medications  sodium chloride 0.9 % bolus 250 mL (250 mLs Intravenous New Bag/Given 08/12/19 1207)  metoCLOPramide (REGLAN) injection 5  mg (5 mg Intravenous Given 08/12/19 1208)     The patient appears reasonably screen and/or stabilized for discharge and I doubt any other medical condition or other [facility] requiring further screening,  evaluation, or treatment in the ED at this time prior to discharge.   Final Clinical Impression(s) / ED Diagnoses Final diagnoses:  Nausea and vomiting in adult    Rx / DC Orders ED Discharge Orders         Ordered    metoCLOPramide (REGLAN) 10 MG tablet  Every 8 hours PRN     08/12/19 [place], Deloise Marchant, DO 08/12/19 1259

## 2019-08-12 NOTE — Discharge Instructions (Signed)
Please call your gastroenterologist and let them know that you had a visit to the emergency department.  Let them know that I started you on Reglan and see if that would impact any of the studies that they have planned for you.  They wanted you on omeprazole and so he can try and take that and see if it helps her symptoms as well.  As we discussed laying back flat can make you have reflux symptoms so if you can try not to have anything to eat or drink including water 4 hours prior to going to bed and that may help you with your symptoms.

## 2019-08-12 NOTE — ED Notes (Signed)
ED Provider at bedside. 

## 2019-08-13 LAB — PATHOLOGIST SMEAR REVIEW

## 2019-09-02 ENCOUNTER — Encounter (HOSPITAL_BASED_OUTPATIENT_CLINIC_OR_DEPARTMENT_OTHER): Payer: Self-pay | Admitting: Emergency Medicine

## 2019-09-02 ENCOUNTER — Other Ambulatory Visit: Payer: Self-pay

## 2019-09-02 DIAGNOSIS — I10 Essential (primary) hypertension: Secondary | ICD-10-CM | POA: Diagnosis not present

## 2019-09-02 DIAGNOSIS — Z79899 Other long term (current) drug therapy: Secondary | ICD-10-CM | POA: Insufficient documentation

## 2019-09-02 DIAGNOSIS — R2243 Localized swelling, mass and lump, lower limb, bilateral: Secondary | ICD-10-CM | POA: Insufficient documentation

## 2019-09-02 DIAGNOSIS — R21 Rash and other nonspecific skin eruption: Secondary | ICD-10-CM | POA: Diagnosis present

## 2019-09-02 NOTE — ED Triage Notes (Signed)
Patient presents with complaints of "lesions" to bilateral breasts and left lower extremities, bilateral lower extremity swelling; sores in mouth; seen by pcp for sores on chest and lower extremity; states onset 2 weeks ago.

## 2019-09-02 NOTE — ED Triage Notes (Signed)
Patient does home peritoneal dialysis nightly; nad noted.

## 2019-09-03 ENCOUNTER — Emergency Department (HOSPITAL_BASED_OUTPATIENT_CLINIC_OR_DEPARTMENT_OTHER)
Admission: EM | Admit: 2019-09-03 | Discharge: 2019-09-03 | Disposition: A | Discharge status: Home / Self Care | Payer: 59 | Attending: Emergency Medicine | Admitting: Emergency Medicine

## 2019-09-03 ENCOUNTER — Encounter (HOSPITAL_BASED_OUTPATIENT_CLINIC_OR_DEPARTMENT_OTHER): Payer: Self-pay | Admitting: Emergency Medicine

## 2019-09-03 DIAGNOSIS — L989 Disorder of the skin and subcutaneous tissue, unspecified: Secondary | ICD-10-CM

## 2019-09-03 DIAGNOSIS — M329 Systemic lupus erythematosus, unspecified: Secondary | ICD-10-CM

## 2019-09-03 DIAGNOSIS — K120 Recurrent oral aphthae: Secondary | ICD-10-CM

## 2019-09-03 NOTE — ED Provider Notes (Signed)
[facility] EMERGENCY DEPARTMENT Provider Note   CSN: [id number] Arrival date & time: 09/02/19  2243     History Chief Complaint  Patient presents with  . Rash  . Foot Swelling    Anne Robinson is a 29 y.o. female.  The history is provided by the patient.  Rash Location: breasts hands legs and now tongue ulcers  Quality: dryness and painful   Quality: not weeping   Pain details:    Quality:  Aching   Severity:  Moderate   Onset quality:  Gradual   Timing:  Constant   Progression:  Worsening Severity:  Moderate Onset quality:  Gradual Timing:  Constant Progression:  Worsening Chronicity:  Recurrent Context: not plant contact and not sick contacts   Relieved by:  Nothing Worsened by:  Nothing Ineffective treatments: prednisone  Associated symptoms: no abdominal pain, no diarrhea, no fatigue, no fever, no headaches, no hoarse voice, no induration, no joint pain, no nausea, no periorbital edema, no shortness of breath, no sore throat, no throat swelling, no tongue swelling, no URI, not vomiting and not wheezing        Past Medical History:  Diagnosis Date  . HTN (hypertension) 05/15/2016  . Lupus ([place])   . Lupus nephritis ([place])   . Renal disorder     Patient Active Problem List   Diagnosis Date Noted  . Cellulitis of chest wall   . Acute renal failure ([place]) 01/26/2017  . Exacerbation of systemic lupus erythematosus ([place]) 01/26/2017  . 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 Tachypnea 01/24/2017  . Dyspnea 01/24/2017  . Macular rash 01/24/2017  . Abscess of left axilla 01/24/2017  . Abscess of axilla, left 01/23/2017  . Hypotension   . Sepsis ([place]) 05/15/2016  . Elevated troponin 05/15/2016  . HTN (hypertension) 05/15/2016  . SLE (systemic lupus erythematosus) ([place]) 05/15/2016    Past Surgical History:  Procedure Laterality Date  . RENAL BIOPSY       OB History   No obstetric history on file.     History reviewed. No pertinent family  history.  Social History   Tobacco Use  . Smoking status: Never Smoker  . Smokeless tobacco: Never Used  Vaping Use  . Vaping Use: Never used  Substance Use Topics  . Alcohol use: No  . Drug use: No    Home Medications Prior to Admission medications   Medication Sig Start Date End Date Taking? Authorizing Provider  famotidine (PEPCID) 20 MG tablet Take by mouth. 01/29/18 09/14/19 Yes [provider]  furosemide (LASIX) 20 MG tablet Take by mouth. 10/08/18  Yes [provider]  HYDROcodone-acetaminophen (NORCO/VICODIN) 5-325 MG tablet Take by mouth. 01/27/19  Yes [provider]  labetalol (NORMODYNE) 300 MG tablet Take by mouth. 05/01/18  Yes [provider]  pantoprazole (PROTONIX) 40 MG tablet Take by mouth. 08/29/19 08/28/20 Yes [provider]  amLODipine (NORVASC) 10 MG tablet Take by mouth. 05/10/19   [provider]  calcitRIOL (ROCALTROL) 0.25 MCG capsule  06/09/19   [provider]  calcium carbonate (OS-CAL) 1250 (500 Ca) MG chewable tablet Chew by mouth.    [provider]  EPINEPHrine 0.3 mg/0.3 mL IJ SOAJ injection Inject into the muscle. 07/12/13   [provider]  fluconazole (DIFLUCAN) 150 MG tablet Take 150 mg daily as needed by mouth (yeast infection).  01/11/17   [provider]  fluticasone (FLONASE) 50 MCG/ACT nasal spray Place into the nose.    [provider]  labetalol (NORMODYNE) 300 MG tablet Take 300 mg by mouth 3 (three) times daily. 06/07/19   [provider]  loratadine (CLARITIN) 10 MG tablet Take 10 mg by mouth daily as needed for allergies.     [provider]  mycophenolate (CELLCEPT) 500 MG tablet  02/12/18   [provider]  nebivolol (BYSTOLIC) 10 MG tablet  16/10/96   [provider]  ondansetron (ZOFRAN) 4 MG tablet Take 1 tablet (4 mg total) by mouth every 6 (six) hours as needed for nausea. 01/28/17   Patrecia Pour, MD    predniSONE (DELTASONE) 10 MG tablet Take by mouth. 05/20/19   [provider]  predniSONE (DELTASONE) 20 MG tablet Take 2 tablets (40 mg total) by mouth daily with breakfast. 01/29/17   Patrecia Pour, MD  temazepam (RESTORIL) 15 MG capsule Take by mouth. 01/21/19   [provider]    Allergies    Lisinopril  Review of Systems   Review of Systems  Constitutional: Negative for fatigue and fever.  HENT: Negative for hoarse voice and sore throat.   Respiratory: Negative for shortness of breath and wheezing.   Cardiovascular: Negative for chest pain.  Gastrointestinal: Negative for abdominal pain, diarrhea, nausea and vomiting.  Genitourinary: Negative for difficulty urinating.  Musculoskeletal: Negative for arthralgias.  Skin: Positive for rash.  Neurological: Negative for headaches.  Psychiatric/Behavioral: Negative for agitation.  All other systems reviewed and are negative.   Physical Exam Updated Vital Signs BP (!) 139/104 (BP Location: Right Wrist)   Pulse 99   Temp 98.8 F (37.1 C) (Oral)   Resp 18   Ht 4\' 11"  (1.499 m)   Wt 115 kg   LMP 07/11/2019   SpO2 100%   BMI 51.21 kg/m   Physical Exam Vitals and nursing note reviewed.  Constitutional:      General: She is not in acute distress.    Appearance: Normal appearance.  HENT:     Head: Normocephalic and atraumatic.     Nose: Nose normal.     Mouth/Throat:     Mouth: Mucous membranes are moist.     Comments: Isolated aphthous ulcer of the tongue  Eyes:     Conjunctiva/sclera: Conjunctivae normal.     Pupils: Pupils are equal, round, and reactive to light.  Cardiovascular:     Rate and Rhythm: Normal rate and regular rhythm.     Pulses: Normal pulses.     Heart sounds: Normal heart sounds.  Pulmonary:     Effort: Pulmonary effort is normal.     Breath sounds: Normal breath sounds.  Abdominal:     General: Abdomen is flat. Bowel sounds are normal.     Tenderness: There is no abdominal  tenderness. There is no guarding or rebound.  Musculoskeletal:        General: Normal range of motion.     Cervical back: Normal range of motion and neck supple.  Skin:    General: Skin is warm and dry.     Capillary Refill: Capillary refill takes less than 2 seconds.     Comments: Discoid lesions on the chest and hands and legs,  No warmth no tenderness no fluctuance   Neurological:     General: No focal deficit present.     Mental Status: She is alert and oriented to person, place, and time.     Deep Tendon Reflexes: Reflexes normal.  Psychiatric:        Mood and Affect: Mood normal.  Behavior: Behavior normal.     ED Results / Procedures / Treatments   Labs (all labs ordered are listed, but only abnormal results are displayed) Labs Reviewed - No data to display  EKG None  Radiology No results found.  Procedures Procedures (including critical care time)  Medications Ordered in ED Medications - No data to display  ED Course  I have reviewed the triage vital signs and the nursing notes.  Pertinent labs & imaging results that were available during my care of the patient were reviewed by me and considered in my medical decision making (see chart for details).    Patient's rheumatologist has seen the lesions and they are related to lupus but have progressed on current treatment.  Call the rheumatologist in the am to be seen.  I would have started steroids but patient is already on these.  She will need evaluation for DMARD treatment.    Anne Robinson was evaluated in Emergency Department on 09/03/2019 for the symptoms described in the history of present illness. She was evaluated in the context of the global COVID-19 pandemic, which necessitated consideration that the patient might be at risk for infection with the SARS-CoV-2 virus that causes COVID-19. Institutional protocols and algorithms that pertain to the evaluation of patients at risk for COVID-19 are in a state  of rapid change based on information released by regulatory bodies including the CDC and federal and state organizations. These policies and algorithms were followed during the patient's care in the ED.  Final Clinical Impression(s) / ED Diagnoses Return for intractable cough, coughing up blood,fevers >100.4 unrelieved by medication, shortness of breath, intractable vomiting, chest pain, shortness of breath, weakness,numbness, changes in speech, facial asymmetry,abdominal pain, passing out,Inability to tolerate liquids or food, cough, altered mental status or any concerns. No signs of systemic illness or infection. The patient is nontoxic-appearing on exam and vital signs are within normal limits.   I have reviewed the triage vital signs and the nursing notes. Pertinent labs &imaging results that were available during my care of the patient were reviewed by me and considered in my medical decision making (see chart for details).After history, exam, and medical workup I feel the patient has beenappropriately medically screened and is safe for discharge home. Pertinent diagnoses were discussed with the patient. Patient was given return precautions.   Selin Eisler, MD 09/03/19 [id number]

## 2019-11-03 ENCOUNTER — Emergency Department (HOSPITAL_BASED_OUTPATIENT_CLINIC_OR_DEPARTMENT_OTHER)
Admission: EM | Admit: 2019-11-03 | Discharge: 2019-11-03 | Disposition: A | Discharge status: Home / Self Care | Payer: 59 | Attending: Emergency Medicine | Admitting: Emergency Medicine

## 2019-11-03 ENCOUNTER — Other Ambulatory Visit: Payer: Self-pay

## 2019-11-03 ENCOUNTER — Encounter (HOSPITAL_BASED_OUTPATIENT_CLINIC_OR_DEPARTMENT_OTHER): Payer: Self-pay | Admitting: *Deleted

## 2019-11-03 ENCOUNTER — Emergency Department (HOSPITAL_BASED_OUTPATIENT_CLINIC_OR_DEPARTMENT_OTHER): Payer: 59

## 2019-11-03 DIAGNOSIS — I120 Hypertensive chronic kidney disease with stage 5 chronic kidney disease or end stage renal disease: Secondary | ICD-10-CM | POA: Insufficient documentation

## 2019-11-03 DIAGNOSIS — Z79899 Other long term (current) drug therapy: Secondary | ICD-10-CM | POA: Diagnosis not present

## 2019-11-03 DIAGNOSIS — N186 End stage renal disease: Secondary | ICD-10-CM | POA: Diagnosis not present

## 2019-11-03 DIAGNOSIS — R519 Headache, unspecified: Secondary | ICD-10-CM | POA: Diagnosis not present

## 2019-11-03 DIAGNOSIS — Z992 Dependence on renal dialysis: Secondary | ICD-10-CM | POA: Insufficient documentation

## 2019-11-03 DIAGNOSIS — R110 Nausea: Secondary | ICD-10-CM | POA: Diagnosis not present

## 2019-11-03 MED ORDER — DIPHENHYDRAMINE HCL 25 MG PO CAPS
25.0000 mg | ORAL_CAPSULE | Freq: Once | ORAL | Status: AC
  Administered 2019-11-03: 25 mg via ORAL
  Filled 2019-11-03: qty 1

## 2019-11-03 MED ORDER — METOCLOPRAMIDE HCL 5 MG/ML IJ SOLN
5.0000 mg | Freq: Once | INTRAMUSCULAR | Status: AC
  Administered 2019-11-03: 5 mg via INTRAMUSCULAR
  Filled 2019-11-03: qty 2

## 2019-11-03 MED ORDER — HYDROCODONE-ACETAMINOPHEN 5-325 MG PO TABS
1.0000 | ORAL_TABLET | Freq: Once | ORAL | Status: AC
  Administered 2019-11-03: 1 via ORAL
  Filled 2019-11-03: qty 1

## 2019-11-03 NOTE — Discharge Instructions (Signed)
Please read and follow all provided instructions.  Your diagnoses today include:  1. Acute nonintractable headache, unspecified headache type     Tests performed today include:  CT of your head which was normal and did not show any serious cause of your headache or problems with the shunt  Vital signs. See below for your results today.   Medications:  In the Emergency Department you received:  Reglan - antinausea/headache medication  Benadryl - antihistamine to counteract potential side effects of reglan  Vicodin - oral narcotic pain medication  Take any prescribed medications only as directed.  Additional information:  Follow any educational materials contained in this packet.  You are having a headache. No specific cause was found today for your headache. It may have been a migraine or other cause of headache. Stress, anxiety, fatigue, and depression are common triggers for headaches.   Your headache today does not appear to be life-threatening or require hospitalization, but often the exact cause of headaches is not determined in the emergency department. Therefore, follow-up with your doctor is very important to find out what may have caused your headache and whether or not you need any further diagnostic testing or treatment.   Sometimes headaches can appear benign (not harmful), but then more serious symptoms can develop which should prompt an immediate re-evaluation by your doctor or the emergency department.  BE VERY CAREFUL not to take multiple medicines containing Tylenol (also called acetaminophen). Doing so can lead to an overdose which can damage your liver and cause liver failure and possibly death.   Follow-up instructions: Please follow-up with your primary care provider in the next 3 days for further evaluation of your symptoms.   Return instructions:   Please return to the Emergency Department if you experience worsening symptoms.  Return if the medications  do not resolve your headache, if it recurs, or if you have multiple episodes of vomiting or cannot keep down fluids.  Return if you have a change from the usual headache.  RETURN IMMEDIATELY IF you:  Develop a sudden, severe headache  Develop confusion or become poorly responsive or faint  Develop a fever above 100.92F or problem breathing  Have a change in speech, vision, swallowing, or understanding  Develop new weakness, numbness, tingling, incoordination in your arms or legs  Have a seizure  Please return if you have any other emergent concerns.  Additional Information:  Your vital signs today were: BP 133/82    Pulse 100    Temp 97.8 F (36.6 C)    Resp 16    Ht 4\' 11"  (1.499 m)    Wt 113.4 kg    SpO2 99%    BMI 50.49 kg/m  If your blood pressure (BP) was elevated above 135/85 this visit, please have this repeated by your doctor within one month. --------------

## 2019-11-03 NOTE — ED Provider Notes (Signed)
[facility] EMERGENCY DEPARTMENT Provider Note   CSN: [id number] Arrival date & time: 11/03/19  1712     History Chief Complaint  Patient presents with  . Headache    Anne Robinson is a 29 y.o. female.  Patient with history of lupus, end-stage renal disease on peritoneal dialysis, history of shunt placement in the last year --presents to the emergency department for headache.  Headache began yesterday and has gradually worsened.  She has had nausea without vomiting.  No vision changes.  She is ambulating at baseline without confusion per family.  She has taken Tylenol 3 at home with mild improvement.  She states that headache is similar to when she found that she needed a shunt.  Onset of symptoms acute.  Light and sound make the symptoms worse.  She does have a history of occasional headaches.        Past Medical History:  Diagnosis Date  . HTN (hypertension) 05/15/2016  . Lupus ([place])   . Lupus nephritis ([place])   . Renal disorder     Patient Active Problem List   Diagnosis Date Noted  . Cellulitis of chest wall   . Acute renal failure ([place]) 01/26/2017  . Exacerbation of systemic lupus erythematosus ([place]) 01/26/2017  . 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 Tachypnea 01/24/2017  . Dyspnea 01/24/2017  . Macular rash 01/24/2017  . Abscess of left axilla 01/24/2017  . Abscess of axilla, left 01/23/2017  . Hypotension   . Sepsis ([place]) 05/15/2016  . Elevated troponin 05/15/2016  . HTN (hypertension) 05/15/2016  . SLE (systemic lupus erythematosus) ([place]) 05/15/2016    Past Surgical History:  Procedure Laterality Date  . RENAL BIOPSY       OB History   No obstetric history on file.     History reviewed. No pertinent family history.  Social History   Tobacco Use  . Smoking status: Never Smoker  . Smokeless tobacco: Never Used  Vaping Use  . Vaping Use: Never used  Substance Use Topics  . Alcohol use: No  . Drug use: No    Home Medications Prior  to Admission medications   Medication Sig Start Date End Date Taking? Authorizing Provider  amLODipine (NORVASC) 10 MG tablet Take by mouth. 05/10/19   [provider]  calcitRIOL (ROCALTROL) 0.25 MCG capsule  06/09/19   [provider]  calcium carbonate (OS-CAL) 1250 (500 Ca) MG chewable tablet Chew by mouth.    [provider]  EPINEPHrine 0.3 mg/0.3 mL IJ SOAJ injection Inject into the muscle. 07/12/13   [provider]  famotidine (PEPCID) 20 MG tablet Take by mouth. 01/29/18 09/14/19  [provider]  fluconazole (DIFLUCAN) 150 MG tablet Take 150 mg daily as needed by mouth (yeast infection).  01/11/17   [provider]  fluticasone (FLONASE) 50 MCG/ACT nasal spray Place into the nose.    [provider]  furosemide (LASIX) 20 MG tablet Take by mouth. 10/08/18   [provider]  HYDROcodone-acetaminophen (NORCO/VICODIN) 5-325 MG tablet Take by mouth. 01/27/19   [provider]  labetalol (NORMODYNE) 300 MG tablet Take 300 mg by mouth 3 (three) times daily. 06/07/19   [provider]  labetalol (NORMODYNE) 300 MG tablet Take by mouth. 05/01/18   [provider]  loratadine (CLARITIN) 10 MG tablet Take 10 mg by mouth daily as needed for allergies.     [provider]  mycophenolate (CELLCEPT) 500 MG tablet  02/12/18   [provider]  nebivolol (BYSTOLIC) 10 MG tablet  82/50/53   [provider]  ondansetron (ZOFRAN) 4 MG tablet Take 1 tablet (4 mg total) by mouth every 6 (six) hours as needed for nausea. 01/28/17   Patrecia Pour, MD  pantoprazole (PROTONIX) 40 MG tablet Take by mouth. 08/29/19 08/28/20  [provider]  predniSONE (DELTASONE) 10 MG tablet Take by mouth. 05/20/19   [provider]  predniSONE (DELTASONE) 20 MG tablet Take 2 tablets (40 mg total) by mouth daily with breakfast. 01/29/17   Patrecia Pour, MD  temazepam (RESTORIL) 15 MG capsule Take by  mouth. 01/21/19   [provider]    Allergies    Lisinopril  Review of Systems   Review of Systems  Constitutional: Negative for fever.  HENT: Negative for congestion, dental problem, rhinorrhea and sinus pressure.   Eyes: Positive for photophobia. Negative for discharge, redness and visual disturbance.  Respiratory: Negative for shortness of breath.   Cardiovascular: Negative for chest pain.  Gastrointestinal: Positive for nausea. Negative for vomiting.  Musculoskeletal: Negative for gait problem, neck pain and neck stiffness.  Skin: Negative for rash.  Neurological: Positive for headaches. Negative for syncope, speech difficulty, weakness, light-headedness and numbness.  Psychiatric/Behavioral: Negative for confusion.    Physical Exam Updated Vital Signs BP (!) 177/114 (BP Location: Left Arm)   Pulse (!) 120   Temp 98.8 F (37.1 C) (Oral)   Resp 18   Ht 4\' 11"  (1.499 m)   Wt 113.4 kg   SpO2 100%   BMI 50.49 kg/m   Physical Exam Vitals and nursing note reviewed.  Constitutional:      General: She is not in acute distress.    Appearance: She is well-developed.  HENT:     Head: Normocephalic and atraumatic.     Comments: Shunt palpable.  No obvious breaks externally.    Right Ear: Tympanic membrane, ear canal and external ear normal.     Left Ear: Tympanic membrane, ear canal and external ear normal.     Nose: Nose normal.     Mouth/Throat:     Pharynx: Uvula midline.  Eyes:     General: Lids are normal.     Extraocular Movements:     Right eye: No nystagmus.     Left eye: No nystagmus.     Conjunctiva/sclera: Conjunctivae normal.     Pupils: Pupils are equal, round, and reactive to light.  Neck:     Comments: Full range of motion, no signs of meningismus. Cardiovascular:     Rate and Rhythm: Normal rate and regular rhythm.     Heart sounds: No murmur heard.   Pulmonary:     Effort: Pulmonary effort is normal. No respiratory distress.     Breath  sounds: Normal breath sounds. No wheezing, rhonchi or rales.  Abdominal:     Palpations: Abdomen is soft.     Tenderness: There is no abdominal tenderness. There is no guarding or rebound.  Musculoskeletal:     Cervical back: Normal range of motion and neck supple. No tenderness or bony tenderness.     Right lower leg: No edema.     Left lower leg: No edema.  Skin:    General: Skin is warm and dry.     Findings: No rash.  Neurological:     General: No focal deficit present.     Mental Status: She is alert and oriented to person, place, and time. Mental status is at baseline.  GCS: GCS eye subscore is 4. GCS verbal subscore is 5. GCS motor subscore is 6.     Cranial Nerves: No cranial nerve deficit.     Sensory: No sensory deficit.     Motor: No weakness.     Coordination: Coordination normal.     Gait: Gait normal.  Psychiatric:        Mood and Affect: Mood normal.     ED Results / Procedures / Treatments   Labs (all labs ordered are listed, but only abnormal results are displayed) Labs Reviewed - No data to display  EKG None  Radiology CT Head Wo Contrast  Result Date: 11/03/2019 CLINICAL DATA:  Chronic headache increasing in frequency, hydrocephalus, ventriculostomy catheter EXAM: CT HEAD WITHOUT CONTRAST TECHNIQUE: Contiguous axial images were obtained from the base of the skull through the vertex without intravenous contrast. COMPARISON:  03/15/2017 FINDINGS: Brain: No acute infarct or hemorrhage. Ventriculostomy catheter via right frontal approach, tip traversing the frontal horns of the lateral ventricles. No evidence of hydrocephalus. Midline structures are unremarkable. No acute extra-axial fluid collections. No mass effect. Vascular: No hyperdense vessel or unexpected calcification. Skull: Normal. Negative for fracture or focal lesion. Sinuses/Orbits: No acute finding. Other: None. IMPRESSION: 1. No acute intracranial process. 2. Ventriculostomy catheter as above, with  decompression of the lateral ventricles. Electronically Signed   By: Randa Ngo M.D.   On: 11/03/2019 22:11    Procedures Procedures (including critical care time)  Medications Ordered in ED Medications  metoCLOPramide (REGLAN) injection 5 mg (5 mg Intramuscular Given 11/03/19 2207)  diphenhydrAMINE (BENADRYL) capsule 25 mg (25 mg Oral Given 11/03/19 2208)  HYDROcodone-acetaminophen (NORCO/VICODIN) 5-325 MG per tablet 1 tablet (1 tablet Oral Given 11/03/19 2252)    ED Course  I have reviewed the triage vital signs and the nursing notes.  Pertinent labs & imaging results that were available during my care of the patient were reviewed by me and considered in my medical decision making (see chart for details).  Patient seen and examined. Work-up initiated. CT ordered, given shunt. Pt discussed with Dr. Gilford Raid.   Vital signs reviewed and are as follows: BP (!) 177/114 (BP Location: Left Arm)   Pulse (!) 120   Temp 98.8 F (37.1 C) (Oral)   Resp 18   Ht 4\' 11"  (1.499 m)   Wt 113.4 kg   SpO2 100%   BMI 50.49 kg/m   CT is reassuring. Pt rechecked, sleeping soundly in room. She still reports HA, wants to go home. Will give dose of oral vicodin prior to discharge.     MDM Rules/Calculators/A&P                          Patient with HA, history of same. Does have a shunt  CT tonight is negative. Patient without high-risk features of headache including: sudden onset/thunderclap HA, no similar headache in past, altered mental status, accompanying seizure, headache with exertion, age > 28, neck or shoulder pain, fever, use of anticoagulation, family history of spontaneous SAH, concomitant drug use, toxic exposure.  Patient is on immunocompromising medications however does not have fever, meningismus or confusion.  Patient has a normal complete neurological exam, normal vital signs, normal level of consciousness, no signs of meningismus, is well-appearing/non-toxic appearing, no signs of  trauma.   No dangerous or life-threatening conditions suspected or identified by history, physical exam, and by work-up. No indications for hospitalization identified.   Final Clinical Impression(s) / ED  Diagnoses Final diagnoses:  Acute nonintractable headache, unspecified headache type    Rx / DC Orders ED Discharge Orders    None       Carlisle Cater, Hershal Coria 11/03/19 2311    Isla Pence, MD 11/03/19 [id number]

## 2019-11-03 NOTE — ED Triage Notes (Signed)
HA x 2 days. Hx of same.

## 2019-11-05 ENCOUNTER — Emergency Department (HOSPITAL_BASED_OUTPATIENT_CLINIC_OR_DEPARTMENT_OTHER)
Admission: EM | Admit: 2019-11-05 | Discharge: 2019-11-05 | Disposition: A | Discharge status: Home / Self Care | Payer: 59 | Attending: Emergency Medicine | Admitting: Emergency Medicine

## 2019-11-05 ENCOUNTER — Encounter (HOSPITAL_BASED_OUTPATIENT_CLINIC_OR_DEPARTMENT_OTHER): Payer: Self-pay | Admitting: *Deleted

## 2019-11-05 ENCOUNTER — Emergency Department (HOSPITAL_BASED_OUTPATIENT_CLINIC_OR_DEPARTMENT_OTHER): Payer: 59

## 2019-11-05 ENCOUNTER — Other Ambulatory Visit: Payer: Self-pay

## 2019-11-05 DIAGNOSIS — Z79899 Other long term (current) drug therapy: Secondary | ICD-10-CM | POA: Diagnosis not present

## 2019-11-05 DIAGNOSIS — Z20822 Contact with and (suspected) exposure to covid-19: Secondary | ICD-10-CM | POA: Diagnosis not present

## 2019-11-05 DIAGNOSIS — N19 Unspecified kidney failure: Secondary | ICD-10-CM | POA: Diagnosis not present

## 2019-11-05 DIAGNOSIS — R519 Headache, unspecified: Secondary | ICD-10-CM | POA: Insufficient documentation

## 2019-11-05 DIAGNOSIS — I10 Essential (primary) hypertension: Secondary | ICD-10-CM | POA: Diagnosis not present

## 2019-11-05 LAB — CBC WITH DIFFERENTIAL/PLATELET
Abs Immature Granulocytes: 0.24 10*3/uL — ABNORMAL HIGH (ref 0.00–0.07)
Basophils Absolute: 0 10*3/uL (ref 0.0–0.1)
Basophils Relative: 1 %
Eosinophils Absolute: 0.1 10*3/uL (ref 0.0–0.5)
Eosinophils Relative: 3 %
HCT: 34.6 % — ABNORMAL LOW (ref 36.0–46.0)
Hemoglobin: 10.6 g/dL — ABNORMAL LOW (ref 12.0–15.0)
Immature Granulocytes: 5 %
Lymphocytes Relative: 20 %
Lymphs Abs: 0.9 10*3/uL (ref 0.7–4.0)
MCH: 28.7 pg (ref 26.0–34.0)
MCHC: 30.6 g/dL (ref 30.0–36.0)
MCV: 93.8 fL (ref 80.0–100.0)
Monocytes Absolute: 0.4 10*3/uL (ref 0.1–1.0)
Monocytes Relative: 10 %
Neutro Abs: 2.8 10*3/uL (ref 1.7–7.7)
Neutrophils Relative %: 61 %
Platelets: 53 10*3/uL — ABNORMAL LOW (ref 150–400)
RBC: 3.69 MIL/uL — ABNORMAL LOW (ref 3.87–5.11)
RDW: 19.1 % — ABNORMAL HIGH (ref 11.5–15.5)
WBC: 4.5 10*3/uL (ref 4.0–10.5)
nRBC: 0 % (ref 0.0–0.2)

## 2019-11-05 LAB — MAGNESIUM: Magnesium: 1.6 mg/dL — ABNORMAL LOW (ref 1.7–2.4)

## 2019-11-05 LAB — BASIC METABOLIC PANEL
Anion gap: 17 — ABNORMAL HIGH (ref 5–15)
BUN: 98 mg/dL — ABNORMAL HIGH (ref 6–20)
CO2: 20 mmol/L — ABNORMAL LOW (ref 22–32)
Calcium: 8.6 mg/dL — ABNORMAL LOW (ref 8.9–10.3)
Chloride: 102 mmol/L (ref 98–111)
Creatinine, Ser: 16.58 mg/dL — ABNORMAL HIGH (ref 0.44–1.00)
GFR calc Af Amer: 3 mL/min — ABNORMAL LOW (ref >60)
GFR calc non Af Amer: 3 mL/min — ABNORMAL LOW (ref >60)
Glucose, Bld: 105 mg/dL — ABNORMAL HIGH (ref 70–99)
Potassium: 6 mmol/L — ABNORMAL HIGH (ref 3.5–5.1)
Sodium: 139 mmol/L (ref 135–145)

## 2019-11-05 LAB — LIPASE, BLOOD: Lipase: 153 U/L — ABNORMAL HIGH (ref 11–51)

## 2019-11-05 LAB — SARS CORONAVIRUS 2 BY RT PCR (HOSPITAL ORDER, PERFORMED IN ~~LOC~~ HOSPITAL LAB): SARS Coronavirus 2: NEGATIVE

## 2019-11-05 MED ORDER — PROCHLORPERAZINE EDISYLATE 10 MG/2ML IJ SOLN
10.0000 mg | Freq: Once | INTRAMUSCULAR | Status: AC
  Administered 2019-11-05: 10 mg via INTRAVENOUS

## 2019-11-05 MED ORDER — PROCHLORPERAZINE EDISYLATE 10 MG/2ML IJ SOLN
10.0000 mg | Freq: Once | INTRAMUSCULAR | Status: DC
  Filled 2019-11-05: qty 2

## 2019-11-05 MED ORDER — DIPHENHYDRAMINE HCL 50 MG/ML IJ SOLN
25.0000 mg | Freq: Once | INTRAMUSCULAR | Status: DC
  Filled 2019-11-05: qty 1

## 2019-11-05 MED ORDER — DIPHENHYDRAMINE HCL 50 MG/ML IJ SOLN
25.0000 mg | Freq: Once | INTRAMUSCULAR | Status: AC
  Administered 2019-11-05: 25 mg via INTRAVENOUS

## 2019-11-05 MED ORDER — SODIUM ZIRCONIUM CYCLOSILICATE 10 G PO PACK
10.0000 g | PACK | Freq: Once | ORAL | Status: AC
  Administered 2019-11-05: 10 g via ORAL
  Filled 2019-11-05: qty 1

## 2019-11-05 NOTE — ED Triage Notes (Signed)
Pt reports vp shunt with ha x 2 days, had LP this am which helped her ha but cont with ha. Pt denies any other c/o.

## 2019-11-05 NOTE — Discharge Instructions (Signed)
Please follow-up with your neurosurgeon and see if they want to make any adjustments to your VP shunt.  Please follow-up with your ophthalmologist.  Follow-up with your nephrologist first thing on Thursday.  They wanted you to be on a low potassium diet.  Continue your peritoneal dialysis.  Please return to the ED for any worsening.

## 2019-11-05 NOTE — ED Provider Notes (Signed)
[facility]ERGENC[facility] DEPARTMENT Provider Note   CSN: [id number] Arrival date & time: 11/05/19  1128     History Chief Complaint  Patient presents with  . Headache    Anne Robinson is a 29 y.o. female.  29 yo F with a chief complaints of a headache.  This is been an ongoing issue for the past 4 days.  She has been seen in the ED and then had an urgent call to her neurologist to set up a lumbar puncture today.  She has a history of pseudotumor cerebri.  She had had a opening pressure of 45 done when she had placement of her shunt.  LP today had a pressure of 35.  Per of the interventional radiologist no plan was for her to follow-up as an outpatient however the patient decided to come to the ED today.  She states that her symptoms have gotten somewhat better since the LP but she still feels that it still there and she still feels a little bit unsteady on her feet.  She has been walking using a walker.  Denies fevers denies neck pain.  Denies trauma.  The history is provided by the patient.  Headache Pain location:  Generalized Quality:  Dull Radiates to:  Does not radiate Severity currently:  3/10 Severity at highest:  10/10 Onset quality:  Gradual Duration:  3 days Timing:  Constant Progression:  Worsening Chronicity:  New Relieved by:  Nothing Worsened by:  Nothing Ineffective treatments:  None tried Associated symptoms: nausea   Associated symptoms: no abdominal pain, no congestion, no dizziness, no fever, no myalgias and no vomiting        Past Medical History:  Diagnosis Date  . HTN (hypertension) 05/15/2016  . Lupus ([place])   . Lupus nephritis ([place])   . Renal disorder     Patient Active Problem List   Diagnosis Date Noted  . Cellulitis of chest wall   . Acute renal failure ([place]) 01/26/2017  . Exacerbation of systemic lupus erythematosus ([place]) 01/26/2017  . 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 Tachypnea 01/24/2017  . Dyspnea 01/24/2017  . Macular rash  01/24/2017  . Abscess of left axilla 01/24/2017  . Abscess of axilla, left 01/23/2017  . Hypotension   . Sepsis ([place]) 05/15/2016  . Elevated troponin 05/15/2016  . HTN (hypertension) 05/15/2016  . SLE (systemic lupus erythematosus) ([place]) 05/15/2016    Past Surgical History:  Procedure Laterality Date  . RENAL BIOPSY       OB History   No obstetric history on file.     History reviewed. No pertinent family history.  Social History   Tobacco Use  . Smoking status: Never Smoker  . Smokeless tobacco: Never Used  Vaping Use  . Vaping Use: Never used  Substance Use Topics  . Alcohol use: No  . Drug use: No    Home Medications Prior to Admission medications   Medication Sig Start Date End Date Taking? Authorizing Provider  amLODipine (NORVASC) 10 MG tablet Take by mouth. 05/10/19   [provider]  calcitRIOL (ROCALTROL) 0.25 MCG capsule  06/09/19   [provider]  calcium carbonate (OS-CAL) 1250 (500 Ca) MG chewable tablet Chew by mouth.    [provider]  EPINEPHrine 0.3 mg/0.3 mL IJ SOAJ injection Inject into the muscle. 07/12/13   [provider]  famotidine (PEPCID) 20 MG tablet Take by mouth. 01/29/18 09/14/19  [provider]  fluconazole (DIFLUCAN) 150 MG tablet Take 150 mg  daily as needed by mouth (yeast infection).  01/11/17   [provider]  fluticasone (FLONASE) 50 MCG/ACT nasal spray Place into the nose.    [provider]  furosemide (LASIX) 20 MG tablet Take by mouth. 10/08/18   [provider]  HYDROcodone-acetaminophen (NORCO/VICODIN) 5-325 MG tablet Take by mouth. 01/27/19   [provider]  labetalol (NORMODYNE) 300 MG tablet Take 300 mg by mouth 3 (three) times daily. 06/07/19   [provider]  labetalol (NORMODYNE) 300 MG tablet Take by mouth. 05/01/18   [provider]  loratadine (CLARITIN) 10 MG tablet Take 10 mg by mouth daily as needed for allergies.      [provider]  mycophenolate (CELLCEPT) 500 MG tablet  02/12/18   [provider]  nebivolol (BYSTOLIC) 10 MG tablet  93/81/01   [provider]  ondansetron (ZOFRAN) 4 MG tablet Take 1 tablet (4 mg total) by mouth every 6 (six) hours as needed for nausea. 01/28/17   Patrecia Pour, MD  pantoprazole (PROTONIX) 40 MG tablet Take by mouth. 08/29/19 08/28/20  [provider]  predniSONE (DELTASONE) 10 MG tablet Take by mouth. 05/20/19   [provider]  predniSONE (DELTASONE) 20 MG tablet Take 2 tablets (40 mg total) by mouth daily with breakfast. 01/29/17   Patrecia Pour, MD  temazepam (RESTORIL) 15 MG capsule Take by mouth. 01/21/19   [provider]    Allergies    Lisinopril  Review of Systems   Review of Systems  Constitutional: Negative for chills and fever.  HENT: Negative for congestion and rhinorrhea.   Eyes: Negative for redness and visual disturbance.  Respiratory: Negative for shortness of breath and wheezing.   Cardiovascular: Negative for chest pain and palpitations.  Gastrointestinal: Positive for nausea. Negative for abdominal pain and vomiting.  Genitourinary: Negative for dysuria and urgency.  Musculoskeletal: Negative for arthralgias and myalgias.  Skin: Negative for pallor and wound.  Neurological: Positive for tremors and headaches. Negative for dizziness.    Physical Exam Updated Vital Signs BP (!) 117/95 (BP Location: Right Arm)   Pulse (!) 101   Temp 98.7 F (37.1 C) (Oral)   Ht 4\' 11"  (1.499 m)   Wt 97.5 kg   SpO2 98%   BMI 43.42 kg/m   Physical Exam Vitals and nursing note reviewed.  Constitutional:      General: She is not in acute distress.    Appearance: She is well-developed. She is not diaphoretic.     Comments: Likely uremic frost about the patient's forehead  HENT:     Head: Normocephalic and atraumatic.  Eyes:     Pupils: Pupils are equal, round, and reactive to light.  Cardiovascular:       Rate and Rhythm: Normal rate and regular rhythm.     Heart sounds: No murmur heard.  No friction rub. No gallop.   Pulmonary:     Effort: Pulmonary effort is normal.     Breath sounds: No wheezing or rales.  Abdominal:     General: There is no distension.     Palpations: Abdomen is soft.     Tenderness: There is no abdominal tenderness.  Musculoskeletal:        General: No tenderness.     Cervical back: Normal range of motion and neck supple.  Skin:    General: Skin is warm and dry.  Neurological:     Mental Status: She is alert and oriented to person, place, and  time.     Comments: Slight tremor.  Some wavering finger-to-nose bilaterally.  Psychiatric:        Behavior: Behavior normal.     ED Results / Procedures / Treatments   Labs (all labs ordered are listed, but only abnormal results are displayed) Labs Reviewed  SARS CORONAVIRUS 2 BY RT PCR (HOSPITAL ORDER, [place] [facility])  CBC WITH DIFFERENTIAL/PLATELET  BASIC METABOLIC PANEL  MAGNESIUM    EKG EKG Interpretation  Date/Time:  Tuesday November 05 2019 12:13:47 EDT Ventricular Rate:  114 PR Interval:  126 QRS Duration: 64 QT Interval:  334 QTC Calculation: 460 R Axis:   22 Text Interpretation: Sinus tachycardia Cannot rule out Anterior infarct , age undetermined Abnormal ECG No significant change since last tracing Confirmed by Deno Etienne [phone]) on 11/05/2019 12:25:39 PM   Radiology CT Head Wo Contrast  Result Date: 11/03/2019 CLINICAL DATA:  Chronic headache increasing in frequency, hydrocephalus, ventriculostomy catheter EXAM: CT HEAD WITHOUT CONTRAST TECHNIQUE: Contiguous axial images were obtained from the base of the skull through the vertex without intravenous contrast. COMPARISON:  03/15/2017 FINDINGS: Brain: No acute infarct or hemorrhage. Ventriculostomy catheter via right frontal approach, tip traversing the frontal horns of the lateral ventricles. No evidence of hydrocephalus.  Midline structures are unremarkable. No acute extra-axial fluid collections. No mass effect. Vascular: No hyperdense vessel or unexpected calcification. Skull: Normal. Negative for fracture or focal lesion. Sinuses/Orbits: No acute finding. Other: None. IMPRESSION: 1. No acute intracranial process. 2. Ventriculostomy catheter as above, with decompression of the lateral ventricles. Electronically Signed   By: Randa Ngo M.D.   On: 11/03/2019 22:11    Procedures Procedures (including critical care time)  Medications Ordered in ED Medications  prochlorperazine (COMPAZINE) injection 10 mg (has no administration in time range)  diphenhydrAMINE (BENADRYL) injection 25 mg (has no administration in time range)    ED Course  I have reviewed the triage vital signs and the nursing notes.  Pertinent labs & imaging results that were available during my care of the patient were reviewed by me and considered in my medical decision making (see chart for details).    MDM Rules/Calculators/A&P                          29 yo F with a significant past medical history of lupus with renal failure on dialysis and a VP shunt due to pseudotumor comes in with a chief complaint of a headache.  Going on for about 4 days with some unsteadiness on her feet and needs a feeling of an upset stomach.  She has had an ED visit prior to this where she had a CT scan that showed good position of the VP shunt.  She was discharged home and is followed up and had a lumbar puncture today.  She feels that her headache is somewhat better but still feels that it is there and she still feels unsteady on her feet and so decided to come to the ED.   I did discuss the case with a nurse practitioner in the neurosurgical group.  Alecia Lemming, NP felt that based on the history provided that the patient could likely follow-up in the office.   I attempted to discuss the case with nephrology and [facility] I was notified that they have no  bed availability.  I discussed the case with our nephrologist, Dr. Justin Mend.  Based on her lab work he did not think  that my itself would indicate she need to come into the hospital.  I thought that her hyperkalemia could be easily fixed with her peritoneal dialysis.    I discussed this with her dialysis coordinator.  She has follow-up with the nephrologist first thing Thursday morning.  They recommended a low potassium diet.  We will give 1 dose of Lokelma here.  CRITICAL CARE Performed by: Cecilio Asper   Total critical care time: 35 minutes  Critical care time was exclusive of separately billable procedures and treating other patients.  Critical care was necessary to treat or prevent imminent or life-threatening deterioration.  Critical care was time spent personally by me on the following activities: development of treatment plan with patient and/or surrogate as well as nursing, discussions with consultants, evaluation of patient's response to treatment, examination of patient, obtaining history from patient or surrogate, ordering and performing treatments and interventions, ordering and review of laboratory studies, ordering and review of radiographic studies, pulse oximetry and re-evaluation of patient's condition.  The patients results and plan were reviewed and discussed.   Any x-rays performed were independently reviewed by myself.   Differential diagnosis were considered with the presenting HPI.  Medications  prochlorperazine (COMPAZINE) injection 10 mg (10 mg Intravenous Given 11/05/19 1352)  diphenhydrAMINE (BENADRYL) injection 25 mg (25 mg Intravenous Given 11/05/19 1353)  sodium zirconium cyclosilicate (LOKELMA) packet 10 g (10 g Oral Given 11/05/19 1522)    Vitals:   11/05/19 1213 11/05/19 1232  BP:  (!) 117/95  Pulse:  (!) 101  Temp:  98.7 F (37.1 C)  TempSrc:  Oral  SpO2:  98%  Weight: 97.5 kg   Height: 4\' 11"  (1.499 m)     Final diagnoses:  Uremia  Bad  headache      Final Clinical Impression(s) / ED Diagnoses Final diagnoses:  None    Rx / DC Orders ED Discharge Orders    None       Deno Etienne, DO 11/05/19 1528

## 2019-12-06 ENCOUNTER — Other Ambulatory Visit: Payer: Self-pay

## 2019-12-06 ENCOUNTER — Encounter (HOSPITAL_BASED_OUTPATIENT_CLINIC_OR_DEPARTMENT_OTHER): Payer: Self-pay

## 2019-12-06 ENCOUNTER — Emergency Department (HOSPITAL_BASED_OUTPATIENT_CLINIC_OR_DEPARTMENT_OTHER)
Admission: EM | Admit: 2019-12-06 | Discharge: 2019-12-07 | Disposition: A | Discharge status: Home / Self Care | Payer: 59 | Attending: Emergency Medicine | Admitting: Emergency Medicine

## 2019-12-06 DIAGNOSIS — T50995A Adverse effect of other drugs, medicaments and biological substances, initial encounter: Secondary | ICD-10-CM | POA: Insufficient documentation

## 2019-12-06 DIAGNOSIS — N186 End stage renal disease: Secondary | ICD-10-CM | POA: Diagnosis not present

## 2019-12-06 DIAGNOSIS — Z992 Dependence on renal dialysis: Secondary | ICD-10-CM | POA: Insufficient documentation

## 2019-12-06 DIAGNOSIS — I120 Hypertensive chronic kidney disease with stage 5 chronic kidney disease or end stage renal disease: Secondary | ICD-10-CM | POA: Diagnosis not present

## 2019-12-06 DIAGNOSIS — R251 Tremor, unspecified: Secondary | ICD-10-CM | POA: Insufficient documentation

## 2019-12-06 DIAGNOSIS — T50905A Adverse effect of unspecified drugs, medicaments and biological substances, initial encounter: Secondary | ICD-10-CM

## 2019-12-06 DIAGNOSIS — Z79899 Other long term (current) drug therapy: Secondary | ICD-10-CM | POA: Insufficient documentation

## 2019-12-06 HISTORY — DX: Benign intracranial hypertension: G93.2

## 2019-12-06 HISTORY — DX: Disorder of thyroid, unspecified: E07.9

## 2019-12-06 NOTE — ED Triage Notes (Signed)
Tremors x 1 month. Pt states she is now not able to get any sleep due to tremors. Pt has seen PCP and neurology for the tremors.

## 2019-12-07 ENCOUNTER — Encounter (HOSPITAL_BASED_OUTPATIENT_CLINIC_OR_DEPARTMENT_OTHER): Payer: Self-pay | Admitting: Emergency Medicine

## 2019-12-07 DIAGNOSIS — R251 Tremor, unspecified: Secondary | ICD-10-CM | POA: Diagnosis not present

## 2019-12-07 MED ORDER — DIAZEPAM 5 MG PO TABS: ORAL_TABLET | ORAL | 0 refills | Status: DC

## 2019-12-07 MED ORDER — DIAZEPAM 5 MG PO TABS
5.0000 mg | ORAL_TABLET | Freq: Once | ORAL | Status: AC
  Administered 2019-12-07: 5 mg via ORAL
  Filled 2019-12-07: qty 1

## 2019-12-07 NOTE — ED Notes (Signed)
ED Provider at bedside. 

## 2019-12-07 NOTE — Discharge Instructions (Signed)
I suspect your symptoms may be due to an adverse reaction to Reglan (metoclopramide).  I recommend you discontinue the Reglan and see if your symptoms improve.

## 2019-12-07 NOTE — ED Provider Notes (Signed)
Anne Robinson Provider Note: Anne Spurling, MD, FACEP  CSN: [id number] MRN: [id number] ARRIVAL: 12/06/19 at [place]: [facility]   HISTORY OF PRESENT ILLNESS  12/07/19 1:42 AM Anne Robinson Anne Robinson is a 29 y.o. female with end-stage renal disease on peritoneal dialysis, and pseudotumor cerebri who has had tremors for over a month.  She is now having tremors so severe she cannot sleep at night.  She has been seen by her PCP and neurology for the tremor.  She describes the tremors as both generalized bilateral tremors as well as myoclonic jerks.  These occur while waking her while sleeping.  She has been taking temazepam without improvement in her sleep.  She also has a feeling of restlessness and agitation.  She was started on Reglan for gastric issues about 6 weeks ago and has been taking it consistently.   Past Medical History:  Diagnosis Date  . HTN (hypertension) 05/15/2016  . Lupus ([place])   . Lupus nephritis ([place])   . Pseudotumor cerebri   . Renal disorder   . Thyroid disease     Past Surgical History:  Procedure Laterality Date  . RENAL BIOPSY      No family history on file.  Social History   Tobacco Use  . Smoking status: Never Smoker  . Smokeless tobacco: Never Used  Vaping Use  . Vaping Use: Never used  Substance Use Topics  . Alcohol use: No  . Drug use: No    Prior to Admission medications   Medication Sig Start Date End Date Taking? Authorizing Provider  amLODipine (NORVASC) 10 MG tablet Take by mouth. 05/10/19   [provider]  calcitRIOL (ROCALTROL) 0.25 MCG capsule  06/09/19   [provider]  calcium carbonate (OS-CAL) 1250 (500 Ca) MG chewable tablet Chew by mouth.    [provider]  diazepam (VALIUM) 5 MG tablet Take 1 tablet at bedtime as needed for sleep. 12/07/19   Assia Meanor, MD  EPINEPHrine 0.3 mg/0.3 mL IJ SOAJ injection Inject into the muscle. 07/12/13   [provider]    famotidine (PEPCID) 20 MG tablet Take by mouth. 01/29/18 09/14/19  [provider]  fluconazole (DIFLUCAN) 150 MG tablet Take 150 mg daily as needed by mouth (yeast infection).  01/11/17   [provider]  fluticasone (FLONASE) 50 MCG/ACT nasal spray Place into the nose.    [provider]  furosemide (LASIX) 20 MG tablet Take by mouth. 10/08/18   [provider]  HYDROcodone-acetaminophen (NORCO/VICODIN) 5-325 MG tablet Take by mouth. 01/27/19   [provider]  labetalol (NORMODYNE) 300 MG tablet Take 300 mg by mouth 3 (three) times daily. 06/07/19   [provider]  labetalol (NORMODYNE) 300 MG tablet Take by mouth. 05/01/18   [provider]  loratadine (CLARITIN) 10 MG tablet Take 10 mg by mouth daily as needed for allergies.     [provider]  mycophenolate (CELLCEPT) 500 MG tablet  02/12/18   [provider]  nebivolol (BYSTOLIC) 10 MG tablet  24/58/09   [provider]  pantoprazole (PROTONIX) 40 MG tablet Take by mouth. 08/29/19 08/28/20  [provider]  predniSONE (DELTASONE) 10 MG tablet Take by mouth. 05/20/19   [provider]  predniSONE (DELTASONE) 20 MG tablet Take 2 tablets (40 mg total) by mouth daily with breakfast. 01/29/17   Patrecia Pour, MD  temazepam (RESTORIL) 15 MG capsule Take by mouth. 01/21/19 12/07/19  [provider]    Allergies Lisinopril   REVIEW OF SYSTEMS  Negative except as noted here or in the History of Present Illness.   PHYSICAL EXAMINATION  Initial Vital Signs Blood pressure (!) 124/94, pulse 94, temperature 98.5 F (36.9 C), temperature source Oral, resp. rate 20, height 4\' 11"  (1.499 m), weight 97.5 kg, last menstrual period 07/06/2019, SpO2 98 %.  Examination General: Well-developed, well-nourished female in no acute distress; appearance consistent with age of record HENT: normocephalic; atraumatic; cushingoid facies Eyes: pupils  equal, round and reactive to light; extraocular muscles intact Neck: supple Heart: regular rate and rhythm Lungs: clear to auscultation bilaterally Abdomen: soft; nondistended; nontender; bowel sounds present Extremities: No deformity; full range of motion; pulses normal Neurologic: Awake, alert and oriented; motor function intact in all extremities and symmetric; no facial droop; mild generalized tremor Skin: Warm and dry Psychiatric: Normal mood and affect   RESULTS  Summary of this visit's results, reviewed and interpreted by myself:   EKG Interpretation  Date/Time:    Ventricular Rate:    PR Interval:    QRS Duration:   QT Interval:    QTC Calculation:   R Axis:     Text Interpretation:        Laboratory Studies: No results found for this or any previous visit (from the past 24 hour(s)). Imaging Studies: No results found.  ED COURSE and MDM  Nursing notes, initial and subsequent vitals signs, including pulse oximetry, reviewed and interpreted by myself.  Vitals:   12/06/19 2316 12/06/19 2317 12/07/19 0212  BP: (!) 124/94  (!) 130/96  Pulse: 94  100  Resp: 20  18  Temp: 98.5 F (36.9 C)  98.5 F (36.9 C)  TempSrc: Oral    SpO2: 98%  99%  Weight:  97.5 kg   Height:  4\' 11"  (1.499 m)    Medications  diazepam (VALIUM) tablet 5 mg (5 mg Oral Given 12/07/19 0211)    The timing of the onset of the tremor coincides with the onset of treatment with Reglan.  I suspect she having side effects of the Reglan including tremor and akathisia.  She was advised to discontinue the Reglan pending follow-up with her gastroenterologist. We will treat her briefly with Valium in place of the temazepam.  PROCEDURES  Procedures   ED DIAGNOSES     ICD-10-CM   1. Adverse reaction to drug in therapeutic use  T50.905A        Larua Collier, Jenny Reichmann, MD 12/07/19 [phone]

## 2020-07-31 ENCOUNTER — Ambulatory Visit: Payer: Medicare Other | Admitting: Occupational Therapy

## 2020-08-10 ENCOUNTER — Ambulatory Visit: Payer: 59 | Attending: Physical Medicine and Rehabilitation | Admitting: Physical Therapy

## 2020-08-10 ENCOUNTER — Ambulatory Visit: Payer: 59 | Admitting: Occupational Therapy

## 2020-08-10 ENCOUNTER — Other Ambulatory Visit: Payer: Self-pay

## 2020-08-10 ENCOUNTER — Encounter: Payer: Self-pay | Admitting: Physical Therapy

## 2020-08-10 DIAGNOSIS — R2681 Unsteadiness on feet: Secondary | ICD-10-CM | POA: Insufficient documentation

## 2020-08-10 DIAGNOSIS — M25612 Stiffness of left shoulder, not elsewhere classified: Secondary | ICD-10-CM

## 2020-08-10 DIAGNOSIS — M25611 Stiffness of right shoulder, not elsewhere classified: Secondary | ICD-10-CM | POA: Insufficient documentation

## 2020-08-10 DIAGNOSIS — M6281 Muscle weakness (generalized): Secondary | ICD-10-CM | POA: Insufficient documentation

## 2020-08-10 DIAGNOSIS — R262 Difficulty in walking, not elsewhere classified: Secondary | ICD-10-CM | POA: Insufficient documentation

## 2020-08-10 DIAGNOSIS — R29898 Other symptoms and signs involving the musculoskeletal system: Secondary | ICD-10-CM | POA: Diagnosis present

## 2020-08-10 NOTE — Therapy (Signed)
[place]. [place], [name], [address] Phone: [phone]   Fax:  [phone]  Physical Therapy Evaluation  Patient Details  Name: [name] MRN: [id number] Date of Birth: [birth year] Referring Provider (PT): Glendale Chard Date: 08/10/2020   PT End of Session - 08/10/20 1522    Visit Number 1    Date for PT Re-Evaluation 11/10/20    Authorization Type 60 combined visits    PT Start Time [id number]    PT Stop Time 1530    PT Time Calculation (min) 45 min    Activity Tolerance Patient tolerated treatment well;Patient limited by fatigue    Behavior During Therapy [facility] for tasks assessed/performed           Past Medical History:  Diagnosis Date  . HTN (hypertension) 05/15/2016  . Lupus ([place])   . Lupus nephritis ([place])   . Pseudotumor cerebri   . Renal disorder   . Thyroid disease     Past Surgical History:  Procedure Laterality Date  . RENAL BIOPSY      There were no vitals filed for this visit.    Subjective Assessment - 08/10/20 1448    Subjective Patient reports that she had multiple seizures, sustained fractures of both shoulders, she had ORIF of both shoulders.  She has had long hospital stays was inpatient rehab until May 23rd.  Has 24 hour care with parents.  She does report very off balance and unsteady.    Pertinent History ESRD. Lupus, HTN    Limitations Lifting;Standing;Walking;House hold activities    How long can you stand comfortably? 2 minutes    How long can you walk comfortably? cannot walk to shop, is mobile in the house, but outside is in a wheelchair    Patient Stated Goals be stronger, walk, cook, shop    Currently in Pain? Yes    Pain Score 6     Pain Location Back    Pain Orientation Mid;Lower    Pain Descriptors / Indicators Aching;Sore    Pain Type Acute pain    Pain Onset More than a month ago    Pain Frequency Intermittent    Aggravating Factors  standing, walking  sitting in dialysis pain up to 10/10    Pain Relieving Factors heat helps, pain med pain can be 0/10    Effect of Pain on Daily Activities limits, sitting, walking, standing              [facility] PT Assessment - 08/10/20 1455      Assessment   Medical Diagnosis weakness, unsteady walking, diff walking    Referring Provider (PT) Aundra Dubin    Onset Date/Surgical Date 02/10/20    Hand Dominance Right      Precautions   Precaution Comments Shunt in place end of 2020      Balance Screen   Has the patient fallen in the past 6 months Yes    How many times? 1    Has the patient had a decrease in activity level because of a fear of falling?  Yes    Is the patient reluctant to leave their home because of a fear of falling?  No      Home Environment   Additional Comments no stairs, has 24 hour care,      Prior Function   Level of Independence Independent    Leisure [facility]      Posture/Postural Control   Posture Comments  very rounded shoulder      ROM / Strength   AROM / PROM / Strength Strength      Strength   Strength Assessment Site Hip;Knee;Ankle    Right/Left Hip Right;Left    Right Hip Flexion 4-/5    Right Hip Extension 4-/5    Right Hip External Rotation  3+/5    Right Hip Internal Rotation 3+/5    Right Hip ABduction 3+/5    Left Hip Flexion 4-/5    Left Hip Extension 4-/5    Left Hip External Rotation 3+/5    Left Hip Internal Rotation 3+/5    Left Hip ABduction 3+/5    Right/Left Knee Right;Left    Right Knee Flexion 4-/5    Right Knee Extension 3+/5    Left Knee Flexion 4-/5    Left Knee Extension 3+/5    Right/Left Ankle Right;Left    Right Ankle Dorsiflexion 4-/5    Right Ankle Plantar Flexion 4-/5    Right Ankle Inversion 4-/5    Right Ankle Eversion 4-/5    Left Ankle Dorsiflexion 4-/5    Left Ankle Plantar Flexion 4-/5    Left Ankle Inversion 4-/5    Left Ankle Eversion 4-/5      Ambulation/Gait   Gait Comments slow, shuffling gait with FWW, CGA for  balance      Standardized Balance Assessment   Standardized Balance Assessment Timed Up and Go Test;Berg Balance Test      Berg Balance Test   Sit to Stand Able to stand  independently using hands    Standing Unsupported Able to stand 2 minutes with supervision    Sitting with Back Unsupported but Feet Supported on Floor or Stool Able to sit safely and securely 2 minutes    Stand to Sit Controls descent by using hands    Transfers Able to transfer safely, definite need of hands    Standing Unsupported with Eyes Closed Able to stand 10 seconds with supervision    Standing Unsupported with Feet Together Able to place feet together independently and stand for 1 minute with supervision    From Standing, Reach Forward with Outstretched Arm Can reach forward >12 cm safely (5")    From Standing Position, Pick up Object from Floor Able to pick up shoe, needs supervision    From Standing Position, Turn to Look Behind Over each Shoulder Looks behind from both sides and weight shifts well    Turn 360 Degrees Able to turn 360 degrees safely in 4 seconds or less    Standing Unsupported, Alternately Place Feet on Step/Stool Able to stand independently and safely and complete 8 steps in 20 seconds    Standing Unsupported, One Foot in Front Able to take small step independently and hold 30 seconds    Standing on One Leg Tries to lift leg/unable to hold 3 seconds but remains standing independently    Total Score 43      Timed Up and Go Test   Normal TUG (seconds) 40    TUG Comments using a FWW,                      Objective measurements completed on examination: See above findings.                 PT Short Term Goals - 08/10/20 1529      PT SHORT TERM GOAL #1   Title independent with initial HEP    Time  4    Period Weeks    Status New             PT Long Term Goals - 08/10/20 1530      PT LONG TERM GOAL #1   Title walk 300 feet with assistive device    Time 12     Period Weeks    Status New      PT LONG TERM GOAL #2   Title decrease TUG time to 20 seconds    Time 12    Period Weeks    Status New      PT LONG TERM GOAL #3   Title increase berg balance score to 48/56    Time 12    Period Weeks    Status New      PT LONG TERM GOAL #4   Title stand to cook a meal without diffiuclty    Time 12    Status New      PT LONG TERM GOAL #5   Title increase hip strength to 4/5    Time 12    Period Weeks                  Plan - 08/10/20 1522    Clinical Impression Statement Patient was living independently in a second floor apartment prior to December 2021, she reports having 2 seizures where she sustained fractures of both shoulders, she had subsequent ORIF's performed and long hospital and rehab stays, she was discharged to live with her parents 07/27/20.  She is very weak in the legs, have very poor endurance, reports difficulty standing >2 minutes and difficulty walking > 60 feet.   She has very poor mm tone and posture.  Her TUG was 40 seconds and her Berg balance was 43/56 putting her at a higher risk for falls.    Personal Factors and Comorbidities Comorbidity 3+    Comorbidities ESRD, HTN, Lupus    Stability/Clinical Decision Making Evolving/Moderate complexity    Clinical Decision Making Moderate    Rehab Potential Fair    PT Frequency 2x / week    PT Duration 12 weeks    PT Treatment/Interventions ADLs/Self Care Home Management;Electrical Stimulation;Moist Heat;Gait training;Neuromuscular re-education;Balance training;Therapeutic exercise;Therapeutic activities;Functional mobility training;Stair training;Patient/family education;Manual techniques    PT Next Visit Plan slowly start exercises for endurance, posture, and balance    Consulted and Agree with Plan of Care Patient           Patient will benefit from skilled therapeutic intervention in order to improve the following deficits and impairments:  Abnormal gait,Decreased  range of motion,Difficulty walking,Decreased activity tolerance,Cardiopulmonary status limiting activity,Pain,Decreased balance,Improper body mechanics,Postural dysfunction,Decreased strength,Decreased mobility  Visit Diagnosis: Difficulty walking - Plan: PT plan of care cert/re-cert  Muscle weakness (generalized) - Plan: PT plan of care cert/re-cert  Unsteady gait - Plan: PT plan of care cert/re-cert     Problem List Patient Active Problem List   Diagnosis Date Noted  . Cellulitis of chest wall   . Acute renal failure ([place]) 01/26/2017  . Exacerbation of systemic lupus erythematosus ([place]) 01/26/2017  . 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 Tachypnea 01/24/2017  . Dyspnea 01/24/2017  . Macular rash 01/24/2017  . Abscess of left axilla 01/24/2017  . Abscess of axilla, left 01/23/2017  . Hypotension   . Sepsis ([place]) 05/15/2016  . Elevated troponin 05/15/2016  . HTN (hypertension) 05/15/2016  . SLE (systemic lupus erythematosus) ([place]) 05/15/2016    Jamez Ambrocio W., PT 08/10/2020, 3:35 PM  [place]. [place], [name], [address] Phone: [phone]   Fax:  [phone]  Name: Anne Robinson MRN: [id number] Date of Birth: [birth year]

## 2020-08-11 NOTE — Therapy (Signed)
[place]. [place], [name], [address] Phone: [phone]   Fax:  [phone]  Occupational Therapy Evaluation  Patient Details  Name: WAUNDA MOHAN MRN: [id number] Date of Birth: [birth year] Referring Provider (OT): Leandro Reasoner, MD   Encounter Date: 08/10/2020   OT End of Session - 08/10/20 1709    Visit Number 1    Number of Visits 17    Authorization Type Medicare ([place] secondary)    Authorization Time Period VL: 60 combined and remaining    Progress Note Due on Visit 10    OT Start Time 1400    OT Stop Time 1443    OT Time Calculation (min) 43 min    Behavior During Therapy [facility] for tasks assessed/performed           Past Medical History:  Diagnosis Date  . HTN (hypertension) 05/15/2016  . Lupus ([place])   . Lupus nephritis ([place])   . Pseudotumor cerebri   . Renal disorder   . Thyroid disease     Past Surgical History:  Procedure Laterality Date  . RENAL BIOPSY      There were no vitals filed for this visit.   Subjective Assessment - 08/10/20 1437    Subjective  Pt arrived to session today w/ primary complaint of decreased functional use and significant weakness of BUEs. She reports a significant hospital course, from initial ED visit in December of 2021 until recent d/c from inpatient therapy at [facility] on 07/27/20. Pt also states she sustained bilateral shoulder fractures, believed to be sustained during grand mal seizure while in the hospital, and that she underwent surgery for the R shoulder in Jan 2022 and the L shoulder x2 in February 2022.    Patient is accompanied by: Family member   Mother   Pertinent History ESRD (peritoneal dialysis; kidney transplant list); intracranial hypertension s/p VP shunt; hx of seizures and PE (currently on Eliquis); ORIF s/p bilateral shoulder fx    Patient Stated Goals "Get stronger;" improve functional use of BUEs    Currently in Pain? Yes     Pain Score 5     Pain Location Back    Pain Orientation Mid;Lower    Pain Descriptors / Indicators Aching    Pain Onset More than a month ago   ~3 months   Pain Frequency Intermittent             [facility] OT Assessment - 08/10/20 1401      Assessment   Medical Diagnosis intracranial hypertension s/p shunt; recent pyogenic arthritis of bilateral shoulders    Referring Provider (OT) Leandro Reasoner, MD    Onset Date/Surgical Date 03/01/21   Bilateral shoulder surgeries (January and February of 2022)   Hand Dominance Right    Prior Therapy Yes      Precautions   Precautions Fall    Precaution Comments Shunt in place end of 2020      Restrictions   Weight Bearing Restrictions No      Balance Screen   Has the patient fallen in the past 6 months Yes    How many times? 1      Home  Environment   Type of Home Aartment    Home Access Level entry    Home Layout One level    Bathroom Shower/Tub Tub/Shower unit    Adaptive equipment Reacher;Long-handled sponge   Dressing stick   [place] - 4 wheels;Bedside commode;Tub  bench;Wheelchair - manual   Rollator   Additional Comments Reports using tub bench consistently, w/c for community mobility, and not frequently using AE (reacher, dressing stick)    Lives With --   Mom Juliann Pulse) and Dad     Prior Function   Level of Independence Independent    Vocation On disability    TEFL teacher    Leisure [place]      ADL   Eating/Feeding Set up    Grooming Independent    Upper Body Bathing Minimal assistance    Lower Body Bathing Modified independent    Upper Body Dressing Minimal assistance   per pt report; states requiring additional assist for donning bra   Lower Body Dressing Minimal assistance    Toilet Transfer Independent    Toileting -  Hygiene Independent      IADL   Prior Level of Function Shopping Independent    Shopping Needs to be accompanied on any shopping trip    Prior Level of Function  Meal Prep Independent    Meal Prep Needs to have meals prepared and served    Prior Level of Function Scientist, research (physical sciences) Relies on family or friends for transportation   Has not been medically cleared to drive at this time   Medication Management Is responsible for taking medication in correct dosages at correct time      Mobility   Mobility Status Comments Requires w/c for community mobility/long distances      Written Expression   Written Experience Within Functional Limits   per pt report     Vision - History   Baseline Vision Wears glasses for distance only    Additional Comments Occasional dizziness/blurry vision      Vision Assessment   Comment To be further assessed in functional context      Activity Tolerance   Activity Tolerance Comments Decreased overall endurance limiting participation in functional activities      Cognition   Overall Cognitive Status Within Functional Limits for tasks assessed    Cognition Comments Pt reports noticing limitations w/ her memory after seizures      Sensation   Light Touch Appears Intact   per pt report     Coordination   Fine Motor Movements are Fluid and Coordinated Yes    9 Hole Peg Test Right;Left   Unable to lift UEs off table during assessment; demo'd sliding movement pattern on pegboard to retrieve/place pegs   Right 9 Hole Peg Test 41 sec    Left 9 Hole Peg Test 48 sec    Box and Blocks Unable to complete due to BUE weakness      ROM / Strength   AROM / PROM / Strength AROM;Strength      AROM   Overall AROM  Deficits    Overall AROM Comments Bilateral elbow, forearm, wrist, and hand AROM WFL    AROM Assessment Site Shoulder    Right/Left Shoulder Right;Left    Right Shoulder Extension 54 Degrees    Right Shoulder Flexion 0 Degrees    Right Shoulder ABduction 26 Degrees    Left Shoulder Extension 59 Degrees    Left Shoulder Flexion 20 Degrees    Left Shoulder ABduction 29 Degrees     Right/Left Elbow --    Right/Left Forearm --      Strength   Overall Strength Deficits    Overall Strength Comments Bilateral shoulder, elbow, forearm, and wrist strength to  be further assessed in gravity-eliminated position; bilateral shoulder 2+/5 against gravity      Hand Function   Right Hand Grip (lbs) 25#    Left Hand Grip (lbs) 23#             OT Short Term Goals - 08/10/20 1730      OT SHORT TERM GOAL #1   Title Pt will verbalize understanding of at least 3 energy conservation strategies to implement during BADL/IADLs    Baseline Decreased endurance impacting daily activities    Time 4    Period Weeks    Status New    Target Date 09/11/20      OT SHORT TERM GOAL #2   Title Pt will demonstrate independence with initial HEP designed for BUE ROM    Baseline No HEP at this time    Time 4    Period Weeks    Status New      OT SHORT TERM GOAL #3   Title Pt will improve strength of R, dominant UE to at least 2+/5    Baseline TBD - MMT to be tested in gravity-eliminated position    Time 4    Period Weeks    Status New      OT SHORT TERM GOAL #4   Title Pt will increase AROM of R and L shoulder flexion to at least 45 degrees to facilitate functional use of BUEs    Baseline No R shoulder flexion against gravity; L shoulder flexion to 26*    Time 4    Period Weeks    Status New             OT Long Term Goals - 08/10/20 1731      OT LONG TERM GOAL #1   Title Pt will be independent w/ full HEP designed for BUE ROM, bilateral coordination, and strength    Baseline No HEP at this time    Time 8    Period Weeks    Status New    Target Date 10/09/20      OT LONG TERM GOAL #2   Title Pt will improve grip strength to 35# or greater in both hands to improve participation in IADLs    Baseline R hand 25#; L hand 23#    Time 8    Period Weeks    Status New      OT LONG TERM GOAL #3   Title Pt will be able to move through at least 50% of shoulder flexion to  improve functional use of BUEs    Baseline No R shoulder flexion against gravity; L shoulder flexion to 26*    Time 8    Period Weeks    Status New      OT LONG TERM GOAL #4   Title Pt will be able to prepare a simple meal/snack w/ Mod I level, using AE/compensatory strategies prn in at least 1 trial    Baseline Currently not preparing meals    Time 8    Period Weeks    Status New      OT LONG TERM GOAL #5   Title Pt will improve BUE strength (shoulders/elbows) to at least 3-/5 to improve participation in IADL/leisure tasks    Baseline TBD - MMT to be further tested    Time 8    Period Weeks    Status New             Plan - 08/10/20 1714  Clinical Impression Statement Pt is a 30 y.o. female who presents to OP OT due to BUE weakness that occurred 2/2 bilateral shoulder fractures and resultant corrective sx (Jan/Feb 2022) and extensive hospital stay. 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 PMHx includes ESRD (currently receiving peritoneal dialysis; on kidney transplant list); intracranial hypertension s/p VP shunt placed in September of 2020 or early 2021; gastroparesis; hx of seizures and PE (currently on Eliquis); and recent pyogenic arthritis of bilateral shoulders. Pt will benefit from skilled occupational therapy services to address strength and endurance, ROM, pain management, compensatory strategies, introduction of AE/assistive devices, and implementation of an HEP to improve participation and safety during ADLs and IADLs.    OT Occupational Profile and History Detailed Assessment- Review of Records and additional review of physical, cognitive, psychosocial history related to current functional performance    Occupational performance deficits (Please refer to evaluation for details): ADL's;IADL's;Work;Leisure;Social Participation    Body Structure / Function /  Physical Skills ADL;Decreased knowledge of use of DME;Strength;Pain;GMC;Balance;Body mechanics;UE functional use;ROM;IADL;Endurance;Coordination;Mobility;Gait    Rehab Potential Good    Clinical Decision Making Several treatment options, min-mod task modification necessary    Comorbidities Affecting Occupational Performance: Presence of comorbidities impacting occupational performance    Modification or Assistance to Complete Evaluation  Min-Moderate modification of tasks or assist with assess necessary to complete eval    OT Frequency 2x / week    OT Duration 8 weeks    OT Treatment/Interventions Self-care/ADL training;Moist Heat;DME and/or AE instruction;Splinting;Aquatic Therapy    Plan Review goals w/ pt; initiate ROM for bilateral shoulders (AAROM, gravity-assisted/minimized, supine)    Consulted and Agree with Plan of Care Patient           Patient will benefit from skilled therapeutic intervention in order to improve the following deficits and impairments:   Body Structure / Function / Physical Skills: ADL,Decreased knowledge of use of DME,Strength,Pain,GMC,Balance,Body mechanics,UE functional use,ROM,IADL,Endurance,Coordination,Mobility,Gait       Visit Diagnosis: Muscle weakness (generalized)  Other symptoms and signs involving the musculoskeletal system  Stiffness of left shoulder, not elsewhere classified  Stiffness of right shoulder, not elsewhere classified    Problem List Patient Active Problem List   Diagnosis Date Noted  . Cellulitis of chest wall   . Acute renal failure ([place]) 01/26/2017  . Exacerbation of systemic lupus erythematosus ([place]) 01/26/2017  . 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 Tachypnea 01/24/2017  . Dyspnea 01/24/2017  . Macular rash 01/24/2017  . Abscess of left axilla 01/24/2017  . Abscess of axilla, left 01/23/2017  . Hypotension   . Sepsis ([place]) 05/15/2016  . Elevated troponin 05/15/2016  . HTN (hypertension) 05/15/2016  . SLE (systemic  lupus erythematosus) ([place]) 05/15/2016     Kathrine Cords, OTR/L, MSOT 08/10/2020, 2:45 PM  [place]. [place], [name], [address] Phone: [phone]   Fax:  [phone]  Name: MANIQUE WOLLEN MRN: [id number] Date of Birth: 04-01-1990

## 2020-08-17 ENCOUNTER — Ambulatory Visit: Payer: 59 | Admitting: Physical Therapy

## 2020-08-17 ENCOUNTER — Encounter: Payer: Self-pay | Admitting: Physical Therapy

## 2020-08-17 ENCOUNTER — Ambulatory Visit: Payer: 59 | Admitting: Occupational Therapy

## 2020-08-17 ENCOUNTER — Other Ambulatory Visit: Payer: Self-pay

## 2020-08-17 DIAGNOSIS — M25611 Stiffness of right shoulder, not elsewhere classified: Secondary | ICD-10-CM

## 2020-08-17 DIAGNOSIS — R2681 Unsteadiness on feet: Secondary | ICD-10-CM

## 2020-08-17 DIAGNOSIS — M6281 Muscle weakness (generalized): Secondary | ICD-10-CM

## 2020-08-17 DIAGNOSIS — R262 Difficulty in walking, not elsewhere classified: Secondary | ICD-10-CM

## 2020-08-17 DIAGNOSIS — M25612 Stiffness of left shoulder, not elsewhere classified: Secondary | ICD-10-CM

## 2020-08-17 NOTE — Therapy (Signed)
[place] [place], [name], [address] Phone: [phone]   Fax:  [phone]  Physical Therapy Treatment  Patient Details  Name: Anne Robinson MRN: [id number] Date of Birth: [birth year] Referring Provider (PT): Glendale Chard Date: 08/17/2020   PT End of Session - 08/17/20 1733     Visit Number 2    Date for PT Re-Evaluation 11/10/20    PT Start Time 1601    PT Stop Time 1645    PT Time Calculation (min) 44 min    Activity Tolerance Patient tolerated treatment well;Patient limited by fatigue    Behavior During Therapy [facility] for tasks assessed/performed             Past Medical History:  Diagnosis Date   HTN (hypertension) 05/15/2016   Lupus ([place])    Lupus nephritis ([place])    Pseudotumor cerebri    Renal disorder    Thyroid disease     Past Surgical History:  Procedure Laterality Date   RENAL BIOPSY      There were no vitals filed for this visit.   Subjective Assessment - 08/17/20 1606     Subjective Pt states that she is feeling tired today after being out. Had MD follow up; shoulder are healing well.    Currently in Pain? Yes    Pain Score 4     Pain Location Back                [facility] PT Assessment - 08/17/20 0001       6 minute walk test results    Endurance additional comments 3MWT: 2.36 minutes no AD x3 laps                           [facility] Adult PT Treatment/Exercise - 08/17/20 0001       Exercises   Exercises Knee/Hip      Knee/Hip Exercises: Aerobic   Nustep L5 x 6 min LE/UE   arms at setting 12     Knee/Hip Exercises: Seated   Long Arc Quad Both;2 sets;10 reps    Long Arc Quad Limitations 2.5#    [facility] x15 with 3 sec hold    Clamshell with TheraBand Red   2x10; 2nd set with green tb   Marching Both;2 sets;10 reps    Marching Limitations 2.5#    Sit to [employer] 3 sets;5 reps;without UE support   from mat table                      PT Short Term Goals - 08/10/20 1529       PT SHORT TERM GOAL #1   Title independent with initial HEP    Time 4    Period Weeks    Status New               PT Long Term Goals - 08/10/20 1530       PT LONG TERM GOAL #1   Title walk 300 feet with assistive device    Time 12    Period Weeks    Status New      PT LONG TERM GOAL #2   Title decrease TUG time to 20 seconds    Time 12    Period Weeks    Status New      PT LONG TERM GOAL #3   Title increase berg balance score  to 48/56    Time 12    Period Weeks    Status New      PT LONG TERM GOAL #4   Title stand to cook a meal without diffiuclty    Time 12    Status New      PT LONG TERM GOAL #5   Title increase hip strength to 4/5    Time 12    Period Weeks                   Plan - 08/17/20 1733     Clinical Impression Statement Gentle progression to TE this rx. Pt required frequent rest breaks d/t reports of fatigue. Needed several rest breaks in supine d/t LBP with prolonged sitting/standing. 3MWT this rx; pt ambulated ~225 ft with no AD and close supervision; stopped test at 2 min 36 seconds. No LOB with gait but did demo increased postural sway and decreased step length with fatigue. Prescribed HEP with handout with seated LE ex's and STS with pt demo understanding. Continue to progress endurance, general strength/conditioning to tolerance.    PT Treatment/Interventions ADLs/Self Care Home Management;Electrical Stimulation;Moist Heat;Gait training;Neuromuscular re-education;Balance training;Therapeutic exercise;Therapeutic activities;Functional mobility training;Stair training;Patient/family education;Manual techniques    PT Next Visit Plan slowly start exercises for endurance, posture, and balance    Consulted and Agree with Plan of Care Patient             Patient will benefit from skilled therapeutic intervention in order to improve the following deficits and impairments:   Abnormal gait, Decreased range of motion, Difficulty walking, Decreased activity tolerance, Cardiopulmonary status limiting activity, Pain, Decreased balance, Improper body mechanics, Postural dysfunction, Decreased strength, Decreased mobility  Visit Diagnosis: Muscle weakness (generalized)  Stiffness of right shoulder, not elsewhere classified  Stiffness of left shoulder, not elsewhere classified  Difficulty walking  Unsteady gait     Problem List Patient Active Problem List   Diagnosis Date Noted   Cellulitis of chest wall    Acute renal failure (HCC) 01/26/2017   Exacerbation of systemic lupus erythematosus ([place]) 01/26/2017   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 [place], [name], [address] Phone: [phone]   Fax:  [phone]  Name: Anne Robinson MRN: [id number] Date of Birth: Apr 13, 1990

## 2020-08-18 NOTE — Therapy (Signed)
[place]. [place], [name], [address] Phone: [phone]   Fax:  [phone]  Occupational Therapy Treatment  Patient Details  Name: Anne Robinson MRN: [id number] Date of Birth: [birth year] Referring Provider (OT): Leandro Reasoner, MD   Encounter Date: 08/17/2020   OT End of Session - 08/17/20 1706     Visit Number 2    Number of Visits 17    Authorization Type Medicare ([employer] secondary)    Authorization Time Period VL: 60 combined and remaining    Progress Note Due on Visit 10    OT Start Time 1700    OT Stop Time 1743    OT Time Calculation (min) 43 min    Behavior During Therapy [facility] for tasks assessed/performed             Past Medical History:  Diagnosis Date   HTN (hypertension) 05/15/2016   Lupus ([place])    Lupus nephritis ([place])    Pseudotumor cerebri    Renal disorder    Thyroid disease     Past Surgical History:  Procedure Laterality Date   RENAL BIOPSY      There were no vitals filed for this visit.   Subjective Assessment - 08/17/20 1704     Subjective  "I try to remember to relax my shoulders when I'm at home" (pt demo's shoulder elevation as compensatory pattern for distal UE weakness)    Patient is accompanied by: --    Pertinent History ESRD (peritoneal dialysis; kidney transplant list); intracranial hypertension s/p VP shunt; hx of seizures and PE (currently on Eliquis); ORIF s/p bilateral shoulder fx    Patient Stated Goals "Get stronger;" improve functional use of BUEs    Currently in Pain? Yes    Pain Score 4     Pain Location Back    Pain Orientation Lower;Mid    Pain Descriptors / Indicators Aching    Pain Type Acute pain    Pain Onset More than a month ago   ~3 months   Aggravating Factors  sitting in dialysis    Pain Relieving Factors pain medication               OT Treatments/Exercises (OP) - 08/17/20 1714       Shoulder Exercises: Supine   Flexion  Right;Left;AAROM;20 reps   4 sets of 5 reps w/ unweighted dowel and elbows flexed at 90 degrees   ABduction AROM;Right;Left;20 reps   2 sets of 10 reps; pillowcase positioned under UE to facilitate ease of movement     Shoulder Exercises: Seated   External Rotation AROM;Right;Left;20 reps   2 sets of 10 reps   Flexion AAROM;Right;Left;20 reps   2 sets of 10 reps w/ UE Ranger; elbow flexed at 90 in starting position     Shoulder Exercises: Sidelying   External Rotation AROM;Strengthening;Right;5 reps    Flexion AAROM;Right;20 reps;Left   2 sets of 10 reps; OT provided support under elbow/forearm              OT Education - 08/17/20 1745     Education Details OT reviewed goals w/ pt; pt was accepting of set goals. OT also provided education on benefit of gravity-assisted/minimized positioning during UE exercises.    Person(s) Educated Patient    Methods Explanation    Comprehension Verbalized understanding              OT Short Term Goals - 08/17/20 1710  OT SHORT TERM GOAL #1   Title Pt will verbalize understanding of at least 3 energy conservation strategies to implement during BADL/IADLs    Baseline Decreased endurance impacting daily activities    Time 4    Period Weeks    Status On-going    Target Date 09/11/20      OT SHORT TERM GOAL #2   Title Pt will demonstrate independence with initial HEP designed for BUE ROM    Baseline No HEP at this time    Time 4    Period Weeks    Status On-going      OT SHORT TERM GOAL #3   Title Pt will improve strength of R, dominant UE to at least 2+/5    Baseline TBD - MMT to be tested in gravity-eliminated position    Time 4    Period Weeks    Status On-going      OT SHORT TERM GOAL #4   Title Pt will increase AROM of R and L shoulder flexion to at least 45 degrees to facilitate functional use of BUEs    Baseline No R shoulder flexion against gravity; L shoulder flexion to 26*    Time 4    Period Weeks    Status  On-going               OT Long Term Goals - 08/17/20 1710       OT LONG TERM GOAL #1   Title Pt will be independent w/ full HEP designed for BUE ROM, bilateral coordination, and strength    Baseline No HEP at this time    Time 8    Period Weeks    Status On-going      OT LONG TERM GOAL #2   Title Pt will improve grip strength to 35# or greater in both hands to improve participation in IADLs    Baseline R hand 25#; L hand 23#    Time 8    Period Weeks    Status On-going      OT LONG TERM GOAL #3   Title Pt will be able to move through at least 50% of shoulder flexion to improve functional use of BUEs    Baseline No R shoulder flexion against gravity; L shoulder flexion to 26*    Time 8    Period Weeks    Status On-going      OT LONG TERM GOAL #4   Title Pt will be able to prepare a simple meal/snack w/ Mod I level, using AE/compensatory strategies prn in at least 1 trial    Baseline Currently not preparing meals    Time 8    Period Weeks    Status On-going      OT LONG TERM GOAL #5   Title Pt will improve BUE strength (shoulders/elbows) to at least 3-/5 to improve participation in IADL/leisure tasks    Baseline TBD - MMT to be further tested    Time 8    Period Weeks    Status On-going               Plan - 08/17/20 1743     Clinical Impression Statement OT initiated BUE exercises to address UE strength, functional use of BUEs, and to determine most effective positioning/alignment for exercises to include in HEP. Due to weakness observed/measured during evaluation, shoulder exercises were introduced in supine to provide scapular support and promote isolated UE movements, as well as in gravity-assisted/minimized positions to  decrease demand and allow for increased repetitions/good alignment; both w/ good results. Pt also required rest breaks between sets/exercises and after re-positioning due to generalized deconditioning and endurance, but was otherwise able to  complete exercises w/out difficulty.    OT Occupational Profile and History Detailed Assessment- Review of Records and additional review of physical, cognitive, psychosocial history related to current functional performance    Occupational performance deficits (Please refer to evaluation for details): ADL's;IADL's;Work;Leisure;Social Participation    Body Structure / Function / Physical Skills ADL;Decreased knowledge of use of DME;Strength;Pain;GMC;Balance;Body mechanics;UE functional use;ROM;IADL;Endurance;Coordination;Mobility;Gait    Rehab Potential Good    Clinical Decision Making Several treatment options, min-mod task modification necessary    Comorbidities Affecting Occupational Performance: Presence of comorbidities impacting occupational performance    Modification or Assistance to Complete Evaluation  Min-Moderate modification of tasks or assist with assess necessary to complete eval    OT Frequency 2x / week    OT Duration 8 weeks    OT Treatment/Interventions Self-care/ADL training;Moist Heat;DME and/or AE instruction;Splinting;Aquatic Therapy    Plan Continue A/AAROM for bilateral shoulders (gravity-assisted/minimized, supine and/or sidelying)    Consulted and Agree with Plan of Care Patient             Patient will benefit from skilled therapeutic intervention in order to improve the following deficits and impairments:   Body Structure / Function / Physical Skills: ADL, Decreased knowledge of use of DME, Strength, Pain, GMC, Balance, Body mechanics, UE functional use, ROM, IADL, Endurance, Coordination, Mobility, Gait       Visit Diagnosis: Muscle weakness (generalized)  Stiffness of right shoulder, not elsewhere classified  Stiffness of left shoulder, not elsewhere classified    Problem List Patient Active Problem List   Diagnosis Date Noted   Cellulitis of chest wall    Acute renal failure (HCC) 01/26/2017   Exacerbation of systemic lupus erythematosus ([place])  01/26/2017   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 [place], [name], [address] Phone: [phone]   Fax:  [phone]  Name: Anne Robinson MRN: [id number] Date of Birth: Aug 02, 1990

## 2020-08-19 ENCOUNTER — Encounter: Payer: Self-pay | Admitting: Physical Therapy

## 2020-08-19 ENCOUNTER — Ambulatory Visit: Payer: 59 | Admitting: Occupational Therapy

## 2020-08-19 ENCOUNTER — Ambulatory Visit: Payer: 59 | Admitting: Physical Therapy

## 2020-08-19 ENCOUNTER — Other Ambulatory Visit: Payer: Self-pay

## 2020-08-19 ENCOUNTER — Encounter: Payer: Self-pay | Admitting: Occupational Therapy

## 2020-08-19 DIAGNOSIS — M25612 Stiffness of left shoulder, not elsewhere classified: Secondary | ICD-10-CM

## 2020-08-19 DIAGNOSIS — R262 Difficulty in walking, not elsewhere classified: Secondary | ICD-10-CM | POA: Diagnosis not present

## 2020-08-19 DIAGNOSIS — R29898 Other symptoms and signs involving the musculoskeletal system: Secondary | ICD-10-CM

## 2020-08-19 DIAGNOSIS — M6281 Muscle weakness (generalized): Secondary | ICD-10-CM

## 2020-08-19 DIAGNOSIS — M25611 Stiffness of right shoulder, not elsewhere classified: Secondary | ICD-10-CM

## 2020-08-19 DIAGNOSIS — R2681 Unsteadiness on feet: Secondary | ICD-10-CM

## 2020-08-19 NOTE — Therapy (Signed)
[place]. [place], [name], [address] Phone: [phone]   Fax:  [phone]  Physical Therapy Treatment  Patient Details  Name: Anne Robinson MRN: [id number] Date of Birth: November 29, 1990 Referring Provider (PT): Glendale Chard Date: 08/19/2020   PT End of Session - 08/19/20 1523     Visit Number 3    Date for PT Re-Evaluation 11/10/20    Authorization Type 60 combined visits    PT Start Time 3244    PT Stop Time 1525    PT Time Calculation (min) 41 min    Activity Tolerance Patient tolerated treatment well;Patient limited by fatigue    Behavior During Therapy [facility] for tasks assessed/performed             Past Medical History:  Diagnosis Date   HTN (hypertension) 05/15/2016   Lupus ([place])    Lupus nephritis ([place])    Pseudotumor cerebri    Renal disorder    Thyroid disease     Past Surgical History:  Procedure Laterality Date   RENAL BIOPSY      There were no vitals filed for this visit.   Subjective Assessment - 08/19/20 1443     Subjective reports that she was a little sore after last time    Currently in Pain? Yes    Pain Score 2     Pain Location Back                               [facility] Adult PT Treatment/Exercise - 08/19/20 0001       Ambulation/Gait   Gait Comments without device 100' x 4      Knee/Hip Exercises: Aerobic   Nustep L5 x 6 min LE/UE      Knee/Hip Exercises: Machines for Strengthening   Cybex Knee Extension 5# 2x10    Cybex Knee Flexion 15# 2x10      Knee/Hip Exercises: Seated   Ball Squeeze x15 with 3 sec hold    Clamshell with TheraBand Red    Marching Both;2 sets;10 reps    Marching Limitations 2.5#      Knee/Hip Exercises: Supine   Other Supine Knee/Hip Exercises feet on ball K2C, trunk rotation, small bridges and isometric abdomincal                      PT Short Term Goals - 08/19/20 1527       PT SHORT TERM  GOAL #1   Title independent with initial HEP    Status Partially Met               PT Long Term Goals - 08/10/20 1530       PT LONG TERM GOAL #1   Title walk 300 feet with assistive device    Time 12    Period Weeks    Status New      PT LONG TERM GOAL #2   Title decrease TUG time to 20 seconds    Time 12    Period Weeks    Status New      PT LONG TERM GOAL #3   Title increase berg balance score to 48/56    Time 12    Period Weeks    Status New      PT LONG TERM GOAL #4   Title stand to cook a meal without diffiuclty  Time 12    Status New      PT LONG TERM GOAL #5   Title increase hip strength to 4/5    Time 12    Period Weeks                   Plan - 08/19/20 1524     Clinical Impression Statement Doing well, adding exercises as she tolerates, still being careful with her back, but she did tolerate the exercises and reports that she wants to get stronger and walk better    PT Next Visit Plan slowly start exercises for endurance, posture, and balance    Consulted and Agree with Plan of Care Patient             Patient will benefit from skilled therapeutic intervention in order to improve the following deficits and impairments:  Abnormal gait, Decreased range of motion, Difficulty walking, Decreased activity tolerance, Cardiopulmonary status limiting activity, Pain, Decreased balance, Improper body mechanics, Postural dysfunction, Decreased strength, Decreased mobility  Visit Diagnosis: Muscle weakness (generalized)  Difficulty walking  Unsteady gait  Other symptoms and signs involving the musculoskeletal system     Problem List Patient Active Problem List   Diagnosis Date Noted   Cellulitis of chest wall    Acute renal failure ([place]) 01/26/2017   Exacerbation of systemic lupus erythematosus ([place]) 01/26/2017   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 [place], [name], [address] Phone: [phone]   Fax:  [phone]  Name: Anne Robinson MRN: [id number] Date of Birth: 05/20/1990

## 2020-08-20 NOTE — Patient Instructions (Signed)
Access Code: [email or web address] URL: [email or web address] Date: 08/20/2020 Prepared by: Merleen Nicely, OTR/L  Exercises Supine Shoulder Press with Dowel - 5 sets - 5 reps Supine Shoulder External Rotation with Resistance - 2 sets - 10 reps Supine Shoulder Horizontal Abduction with Resistance - 2 sets - 10 reps Putty Squeezes - 2 sets - 15 reps

## 2020-08-20 NOTE — Therapy (Signed)
[place]. [place], [name], [address] Phone: [phone]   Fax:  [phone]  Occupational Therapy Treatment  Patient Details  Name: Anne Robinson MRN: [id number] Date of Birth: [birth year] Referring Provider (OT): Leandro Reasoner, MD   Encounter Date: 08/19/2020   OT End of Session - 08/19/20 1405     Visit Number 3    Number of Visits 17    Authorization Type Medicare ([place] secondary)    Authorization Time Period VL: 3/60 combined    Progress Note Due on Visit 10    OT Start Time 1400    OT Stop Time 1443    OT Time Calculation (min) 43 min    Activity Tolerance Patient tolerated treatment well    Behavior During Therapy [facility] for tasks assessed/performed             Past Medical History:  Diagnosis Date   HTN (hypertension) 05/15/2016   Lupus ([place])    Lupus nephritis ([place])    Pseudotumor cerebri    Renal disorder    Thyroid disease     Past Surgical History:  Procedure Laterality Date   RENAL BIOPSY      There were no vitals filed for this visit.   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 Yes    Pain Score 2     Pain Location Back    Pain Orientation Mid;Lower    Pain Descriptors / Indicators Aching    Pain Type Acute pain    Pain Onset More than a month ago   ~3 months   Pain Frequency Intermittent               OT Treatments/Exercises (OP) - 08/19/20 1442       Shoulder Exercises: Supine   Horizontal ABduction AROM;Strengthening;Both;20 reps;Theraband   2 sets of 10 reps; starting position of elbows flexed to chest height   Theraband Level (Shoulder Horizontal  ABduction) Level 1 (Yellow)    External Rotation AROM;Strengthening;Right;Left;20 reps;Theraband   2 sets of 10 reps; OT provided verbal and tactile cueing to facilitate alignment/isolated shoulder external rotation   Theraband Level (Shoulder External Rotation) Level 1 (Yellow)    Flexion AAROM;Both;20 reps;Limitations   2 sets of 10 reps w/ unweighted dowel and elbows flexed at 90 degrees/shoulders adducted     Shoulder Exercises: Sidelying   Flexion AAROM;Right;20 reps;Left   2 sets of 10 reps holding on to dowel w/ forearm in neutral; OT provided support under elbow/forearm due to discomfort     Theraputty   Theraputty - Grip Full gross grasp of putty (soft; yellow) completed x15 in each hand              OT Education - 08/19/20 1724     Education Details OT initiated HEP    Person(s) Educated Patient    Methods Explanation;Handout;Demonstration    Comprehension Verbalized understanding;Returned demonstration              OT Short Term Goals - 08/17/20 1710       OT SHORT TERM GOAL #1   Title Pt will verbalize understanding of at least 3  energy conservation strategies to implement during BADL/IADLs    Baseline Decreased endurance impacting daily activities    Time 4    Period Weeks    Status On-going    Target Date 09/11/20      OT SHORT TERM GOAL #2   Title Pt will demonstrate independence with initial HEP designed for BUE ROM    Baseline No HEP at this time    Time 4    Period Weeks    Status On-going      OT SHORT TERM GOAL #3   Title Pt will improve strength of R, dominant UE to at least 2+/5    Baseline TBD - MMT to be tested in gravity-eliminated position    Time 4    Period Weeks    Status On-going      OT SHORT TERM GOAL #4   Title Pt will increase AROM of R and L shoulder flexion to at least 45 degrees to facilitate functional use of BUEs    Baseline No R shoulder flexion against gravity; L shoulder flexion to 26*    Time 4    Period Weeks     Status On-going               OT Long Term Goals - 08/17/20 1710       OT LONG TERM GOAL #1   Title Pt will be independent w/ full HEP designed for BUE ROM, bilateral coordination, and strength    Baseline No HEP at this time    Time 8    Period Weeks    Status On-going      OT LONG TERM GOAL #2   Title Pt will improve grip strength to 35# or greater in both hands to improve participation in IADLs    Baseline R hand 25#; L hand 23#    Time 8    Period Weeks    Status On-going      OT LONG TERM GOAL #3   Title Pt will be able to move through at least 50% of shoulder flexion to improve functional use of BUEs    Baseline No R shoulder flexion against gravity; L shoulder flexion to 26*    Time 8    Period Weeks    Status On-going      OT LONG TERM GOAL #4   Title Pt will be able to prepare a simple meal/snack w/ Mod I level, using AE/compensatory strategies prn in at least 1 trial    Baseline Currently not preparing meals    Time 8    Period Weeks    Status On-going      OT LONG TERM GOAL #5   Title Pt will improve BUE strength (shoulders/elbows) to at least 3-/5 to improve participation in IADL/leisure tasks    Baseline TBD - MMT to be further tested    Time 8    Period Weeks    Status On-going               Plan - 08/19/20 1405     Clinical Impression Statement OT continued to facilitate BUE ROM and strengthening exercises in supine and sidelying for benefit of gravity-minimized/eliminated positioning. OT also facilitated AROM of shoulder external rotation and abduction; pt completed exercises w/out difficulty and OT graded exercises up to include theraband for light strengthening. After completing graded exercises successfully, OT instructed pt on including exercises in initial HEP, providing appropriate modifications prn, and administered HEP handout w/ yellow  theraband. Grip strength also initiated this session w/ OT administering soft, yellow therapy putty.     OT Occupational Profile and History Detailed Assessment- Review of Records and additional review of physical, cognitive, psychosocial history related to current functional performance    Occupational performance deficits (Please refer to evaluation for details): ADL's;IADL's;Work;Leisure;Social Participation    Body Structure / Function / Physical Skills ADL;Decreased knowledge of use of DME;Strength;Pain;GMC;Balance;Body mechanics;UE functional use;ROM;IADL;Endurance;Coordination;Mobility;Gait    Rehab Potential Good    Clinical Decision Making Several treatment options, min-mod task modification necessary    Comorbidities Affecting Occupational Performance: Presence of comorbidities impacting occupational performance    Modification or Assistance to Complete Evaluation  Min-Moderate modification of tasks or assist with assess necessary to complete eval    OT Frequency 2x / week    OT Duration 8 weeks    OT Treatment/Interventions Self-care/ADL training;Moist Heat;DME and/or AE instruction;Splinting;Aquatic Therapy    Plan Continue A/AAROM and light strengthening for BUEs (gravity-assisted/minimized, supine and/or sidelying)    Consulted and Agree with Plan of Care Patient             Patient will benefit from skilled therapeutic intervention in order to improve the following deficits and impairments:   Body Structure / Function / Physical Skills: ADL, Decreased knowledge of use of DME, Strength, Pain, GMC, Balance, Body mechanics, UE functional use, ROM, IADL, Endurance, Coordination, Mobility, Gait       Visit Diagnosis: Muscle weakness (generalized)  Stiffness of right shoulder, not elsewhere classified  Stiffness of left shoulder, not elsewhere classified    Problem List Patient Active Problem List   Diagnosis Date Noted   Cellulitis of chest wall    Acute renal failure (HCC) 01/26/2017   Exacerbation of systemic lupus erythematosus ([place]) 01/26/2017   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 [place], [name], [address] Phone: [phone]   Fax:  [phone]  Name: ALLISANDRA STONECYPHER MRN: [id number] Date of Birth: [birth year]

## 2020-08-24 ENCOUNTER — Ambulatory Visit: Payer: 59 | Admitting: Physical Therapy

## 2020-08-26 ENCOUNTER — Ambulatory Visit: Payer: 59 | Admitting: Physical Therapy

## 2020-08-26 ENCOUNTER — Ambulatory Visit: Payer: 59 | Admitting: Occupational Therapy

## 2020-08-26 ENCOUNTER — Encounter: Payer: Self-pay | Admitting: Physical Therapy

## 2020-08-26 ENCOUNTER — Other Ambulatory Visit: Payer: Self-pay

## 2020-08-26 ENCOUNTER — Encounter: Payer: Self-pay | Admitting: Occupational Therapy

## 2020-08-26 DIAGNOSIS — R262 Difficulty in walking, not elsewhere classified: Secondary | ICD-10-CM | POA: Diagnosis not present

## 2020-08-26 DIAGNOSIS — R2681 Unsteadiness on feet: Secondary | ICD-10-CM

## 2020-08-26 DIAGNOSIS — R29898 Other symptoms and signs involving the musculoskeletal system: Secondary | ICD-10-CM

## 2020-08-26 DIAGNOSIS — M6281 Muscle weakness (generalized): Secondary | ICD-10-CM

## 2020-08-26 NOTE — Therapy (Signed)
[place]. [place], [name], [address] Phone: [phone]   Fax:  [phone]  Physical Therapy Treatment  Patient Details  Name: Anne Robinson MRN: [id number] Date of Birth: Sep 04, 1990 Referring Provider (PT): Glendale Chard Date: 08/26/2020   PT End of Session - 08/26/20 1424     Visit Number 4    Date for PT Re-Evaluation 11/10/20    PT Start Time 9417    PT Stop Time 1430    PT Time Calculation (min) 52 min    Activity Tolerance Patient tolerated treatment well;Patient limited by fatigue    Behavior During Therapy [facility] for tasks assessed/performed             Past Medical History:  Diagnosis Date   HTN (hypertension) 05/15/2016   Lupus ([place])    Lupus nephritis ([place])    Pseudotumor cerebri    Renal disorder    Thyroid disease     Past Surgical History:  Procedure Laterality Date   RENAL BIOPSY      There were no vitals filed for this visit.   Subjective Assessment - 08/26/20 1340     Subjective Pt states she slightly tweaked R hip this weekend when scooting a chair forward. Otherwise states doing well.    Currently in Pain? Yes    Pain Score 2     Pain Location Back                               [facility] Adult PT Treatment/Exercise - 08/26/20 0001       Ambulation/Gait   Gait Comments without device 100' x1      Knee/Hip Exercises: Aerobic   Nustep L5 x 7 min LE/UE      Knee/Hip Exercises: Machines for Strengthening   Cybex Knee Extension 5# 2x10    Cybex Knee Flexion 15# 2x10      Knee/Hip Exercises: Seated   Ball Squeeze x15 with 3 sec hold    Clamshell with TheraBand Red   x15   Marching Both;2 sets;10 reps    Marching Limitations 2.5#    Sit to [employer] 3 sets;5 reps;without UE support      Knee/Hip Exercises: Supine   Straight Leg Raises Limitations 2x10 LLE; did not complete on RLE d/t increase in R hip pain this rx    Other Supine Knee/Hip  Exercises marching with 2.5# weight cues for core contraction 3x5 BLE    Other Supine Knee/Hip Exercises feet on ball K2C, trunk rotation, small bridges and isometric abdomincal      Modalities   Modalities Moist Heat      Moist Heat Therapy   Number Minutes Moist Heat 10 Minutes    Moist Heat Location Lumbar Spine                      PT Short Term Goals - 08/19/20 1527       PT SHORT TERM GOAL #1   Title independent with initial HEP    Status Partially Met               PT Long Term Goals - 08/10/20 1530       PT LONG TERM GOAL #1   Title walk 300 feet with assistive device    Time 12    Period Weeks    Status New  PT LONG TERM GOAL #2   Title decrease TUG time to 20 seconds    Time 12    Period Weeks    Status New      PT LONG TERM GOAL #3   Title increase berg balance score to 48/56    Time 12    Period Weeks    Status New      PT LONG TERM GOAL #4   Title stand to cook a meal without diffiuclty    Time 12    Status New      PT LONG TERM GOAL #5   Title increase hip strength to 4/5    Time 12    Period Weeks                   Plan - 08/26/20 1433     Clinical Impression Statement Pt tolerated progression of TE well; able to tolerate increased number of seated ex's before requiring a supine rest break. Cues for core contraction with supine ex's. Unable to complete SLR on RLE d/t increased R hip pain this rx. Education on progression of walking program with rollator at apartment complex starting with ~5 minutes with parents outside and progressing time/distance as tolerated. Heat at end of session for LBP.    PT Treatment/Interventions ADLs/Self Care Home Management;Electrical Stimulation;Moist Heat;Gait training;Neuromuscular re-education;Balance training;Therapeutic exercise;Therapeutic activities;Functional mobility training;Stair training;Patient/family education;Manual techniques    PT Next Visit Plan slowly start  exercises for endurance, posture, and balance    Consulted and Agree with Plan of Care Patient             Patient will benefit from skilled therapeutic intervention in order to improve the following deficits and impairments:  Abnormal gait, Decreased range of motion, Difficulty walking, Decreased activity tolerance, Cardiopulmonary status limiting activity, Pain, Decreased balance, Improper body mechanics, Postural dysfunction, Decreased strength, Decreased mobility  Visit Diagnosis: Muscle weakness (generalized)  Difficulty walking  Unsteady gait     Problem List Patient Active Problem List   Diagnosis Date Noted   Cellulitis of chest wall    Acute renal failure (HCC) 01/26/2017   Exacerbation of systemic lupus erythematosus ([place]) 01/26/2017   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 [place], [name], [address] Phone: [phone]   Fax:  [phone]  Name: BRENT NOTO MRN: [id number] Date of Birth: 11/12/1990

## 2020-08-28 NOTE — Therapy (Signed)
[place] [facility]. [place], [name], [address] Phone: [phone]   Fax:  [phone]  Occupational Therapy Treatment  Patient Details  Name: Anne Robinson MRN: [id number] Date of Birth: 02/27/1991 Referring Provider (OT): Leandro Reasoner, MD   Encounter Date: 08/26/2020   OT End of Session - 08/26/20 [id number]      Visit Number 4    Number of Visits 17    Authorization Type Medicare ([employer] secondary)    Authorization Time Period VL: 7/60 combined    Progress Note Due on Visit 10    OT Start Time 1440    OT Stop Time 1525    OT Time Calculation (min) 45 min    Activity Tolerance Patient tolerated treatment well    Behavior During Therapy [facility] for tasks assessed/performed      Past Medical History:  Diagnosis Date   HTN (hypertension) 05/15/2016   Lupus ([place])    Lupus nephritis ([place])    Pseudotumor cerebri    Renal disorder    Thyroid disease     Past Surgical History:  Procedure Laterality Date   RENAL BIOPSY      There were no vitals filed for this visit.   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 Yes    Pain Score 4      Pain Location Back    Pain Orientation Mid;Lower    Pain Descriptors / Indicators Aching; Other (Comment)   reports a pulling pain in her RLE     OT Treatment/Exercises - 08/26/20 1447 Shoulder Exercises: Seated Horizontal ABduction: AAROM, Right, Left, 15 reps (using UE Ranger; OT support under elbow/forearm) Flexion: AAROM, Right, Left, 20 reps (2 sets of 10 reps rolling large therapy ball)   Shoulder Exercises: Sidelying Flexion: AAROM, Right, Left, 20 reps (2 sets of 10  reps w/ UE ranger) Other Sidelying Exercises: AAROM chest press w/ UE Ranger; 2 sets of 10 reps   Manual Therapy Manual Therapy: Soft tissue mobilization Soft tissue mobilization: Gentle massage w/ lotion to lateral aspect of R upper arm to facilitate mobility within tissue structures and to decrease tension, edema, and pain (pt in sidelying position; tolerated w/out discomfort)      OT Short Term Goals - 08/17/20 1710       OT SHORT TERM GOAL #1   Title Pt will verbalize understanding of at least 3 energy conservation strategies to implement during BADL/IADLs    Baseline Decreased endurance impacting daily activities    Time 4    Period Weeks    Status On-going    Target Date 09/11/20      OT SHORT TERM GOAL #2   Title Pt will demonstrate independence with initial HEP designed for BUE ROM    Baseline No HEP at this time    Time 4    Period Weeks    Status On-going      OT SHORT TERM GOAL #3   Title Pt will improve strength of R, dominant UE to at least 2+/5    Baseline TBD - MMT to be tested in gravity-eliminated position    Time 4    Period  Weeks    Status On-going      OT SHORT TERM GOAL #4   Title Pt will increase AROM of R and L shoulder flexion to at least 45 degrees to facilitate functional use of BUEs    Baseline No R shoulder flexion against gravity; L shoulder flexion to 26*    Time 4    Period Weeks    Status On-going              OT Long Term Goals - 08/17/20 1710       OT LONG TERM GOAL #1   Title Pt will be independent w/ full HEP designed for BUE ROM, bilateral coordination, and strength    Baseline No HEP at this time    Time 8    Period Weeks    Status On-going      OT LONG TERM GOAL #2   Title Pt will improve grip strength to 35# or greater in both hands to improve participation in IADLs    Baseline R hand 25#; L hand 23#    Time 8    Period Weeks    Status On-going      OT LONG TERM GOAL #3   Title Pt will be able to move through at  least 50% of shoulder flexion to improve functional use of BUEs    Baseline No R shoulder flexion against gravity; L shoulder flexion to 26*    Time 8    Period Weeks    Status On-going      OT LONG TERM GOAL #4   Title Pt will be able to prepare a simple meal/snack w/ Mod I level, using AE/compensatory strategies prn in at least 1 trial    Baseline Currently not preparing meals    Time 8    Period Weeks    Status On-going      OT LONG TERM GOAL #5   Title Pt will improve BUE strength (shoulders/elbows) to at least 3-/5 to improve participation in IADL/leisure tasks    Baseline TBD - MMT to be further tested    Time 8    Period Weeks    Status On-going               Plan - 08/26/20 1442      Clinical Impression Statement OT continued to facilitate BUE ROM and strengthening exercises in supine and sidelying for benefit of gravity-minimized/eliminated positioning, including introduction of horizontal abd/adduction. To facilitate energy conservation, pt completed RUE exercises in sidelying, transitioned to sitting for exercises while seated, and then transitioned to sidelying for LUE exercises. OT also incorporated soft tissue mobilization to RUE due to scar tissue, pain, and edema, which likely are limiting to movement.    OT Occupational Profile and History Detailed Assessment- Review of Records and additional review of physical, cognitive, psychosocial history related to current functional performance    Occupational performance deficits (Please refer to evaluation for details): ADL's;IADL's;Work;Leisure;Social Participation    Body Structure / Function / Physical Skills ADL;Decreased knowledge of use of DME;Strength;Pain;GMC;Balance;Body mechanics;UE functional use;ROM;IADL;Endurance;Coordination;Mobility;Gait    Rehab Potential Good    Clinical Decision Making Several treatment options, min-mod task modification necessary    Comorbidities Affecting Occupational Performance:  Presence of comorbidities impacting occupational performance    Modification or Assistance to Complete Evaluation Min-Moderate modification of tasks or assist with assess necessary to complete eval    OT Frequency 2x / week    OT Duration 8 weeks  OT Treatment/Interventions Self-care/ADL training;Moist Heat;DME and/or AE instruction;Splinting;Aquatic Therapy    Plan Continue A/AAROM and light strengthening for BUEs (gravity-assisted/minimized, supine and/or sidelying); manual therapy to BUEs    Consulted and Agree with Plan of Care Patient       Patient will benefit from skilled therapeutic intervention in order to improve the following deficits and impairments:   Body Structure / Function / Physical Skills: ADL, Decreased knowledge of use of DME, Strength, Pain, GMC, Balance, Body mechanics, UE functional use, ROM, IADL, Endurance, Coordination, Mobility, Gait      Visit Diagnosis: Muscle weakness (generalized)  Other symptoms and signs involving the musculoskeletal system    Problem List Patient Active Problem List   Diagnosis Date Noted   Cellulitis of chest wall    Acute renal failure ([place]) 01/26/2017   Exacerbation of systemic lupus erythematosus ([place]) 01/26/2017   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 [place], [name], [address] Phone: [phone]   Fax:  [phone]  Name: MIKKAYLA GARWOOD MRN: [id number] Date of Birth: September 23, 1990

## 2020-08-31 ENCOUNTER — Ambulatory Visit: Payer: 59 | Admitting: Physical Therapy

## 2020-08-31 ENCOUNTER — Ambulatory Visit: Payer: 59 | Admitting: Occupational Therapy

## 2020-09-02 ENCOUNTER — Encounter: Payer: Self-pay | Admitting: Physical Therapy

## 2020-09-02 ENCOUNTER — Other Ambulatory Visit: Payer: Self-pay

## 2020-09-02 ENCOUNTER — Ambulatory Visit: Payer: 59 | Admitting: Physical Therapy

## 2020-09-02 ENCOUNTER — Ambulatory Visit: Payer: 59 | Admitting: Occupational Therapy

## 2020-09-02 ENCOUNTER — Encounter: Payer: Self-pay | Admitting: Occupational Therapy

## 2020-09-02 DIAGNOSIS — R29898 Other symptoms and signs involving the musculoskeletal system: Secondary | ICD-10-CM

## 2020-09-02 DIAGNOSIS — M6281 Muscle weakness (generalized): Secondary | ICD-10-CM

## 2020-09-02 DIAGNOSIS — R262 Difficulty in walking, not elsewhere classified: Secondary | ICD-10-CM

## 2020-09-02 DIAGNOSIS — M25611 Stiffness of right shoulder, not elsewhere classified: Secondary | ICD-10-CM

## 2020-09-02 DIAGNOSIS — R2681 Unsteadiness on feet: Secondary | ICD-10-CM

## 2020-09-02 NOTE — Therapy (Signed)
[place]. [place], [name], [address] Phone: [phone]   Fax:  [phone]  Physical Therapy Treatment  Patient Details  Name: Anne Robinson MRN: [id number] Date of Birth: 05/30/1990 Referring Provider (PT): Glendale Chard Date: 09/02/2020   PT End of Session - 09/02/20 1522     Visit Number 5    Date for PT Re-Evaluation 11/10/20    Authorization Type 60 combined visits    PT Start Time 4680    PT Stop Time 1526    PT Time Calculation (min) 43 min    Activity Tolerance Patient tolerated treatment well;Patient limited by fatigue    Behavior During Therapy [facility] for tasks assessed/performed             Past Medical History:  Diagnosis Date   HTN (hypertension) 05/15/2016   Lupus ([place])    Lupus nephritis ([place])    Pseudotumor cerebri    Renal disorder    Thyroid disease     Past Surgical History:  Procedure Laterality Date   RENAL BIOPSY      There were no vitals filed for this visit.   Subjective Assessment - 09/02/20 1447     Subjective Still some pain, feels stonger at times but still weak on dialysis days.  Reports walking better with less fatigue at times    Currently in Pain? Yes    Pain Score 3     Pain Location Back    Pain Orientation Lower    Aggravating Factors  walking and sitting                               [facility] Adult PT Treatment/Exercise - 09/02/20 0001       Ambulation/Gait   Gait Comments no device, 3x 100'      Knee/Hip Exercises: Machines for Strengthening   Cybex Knee Extension 5# 2x10    Cybex Knee Flexion 15# 2x10      Knee/Hip Exercises: Standing   Hip Abduction 2 sets;10 reps;Both      Knee/Hip Exercises: Supine   Straight Leg Raises Limitations 2x10 LLE; 3x 5 on the right    Other Supine Knee/Hip Exercises marching with 2.5# weight cues for core contraction, clamshells with red tband, ball b/n knees squeezes    Other Supine  Knee/Hip Exercises feet on ball K2C, trunk rotation, small bridges and isometric abdomincal                      PT Short Term Goals - 08/19/20 1527       PT SHORT TERM GOAL #1   Title independent with initial HEP    Status Partially Met               PT Long Term Goals - 09/02/20 1525       PT LONG TERM GOAL #1   Title walk 300 feet with assistive device    Status On-going      PT LONG TERM GOAL #2   Title decrease TUG time to 20 seconds    Status On-going                   Plan - 09/02/20 1523     Clinical Impression Statement Patient reports doing better with walking, she reports feeling stronger, reports still struggles on her dialysis days.  She reports walking more at  home without the rollator.  Still fatigued, needs cues for posture when we are sitting    PT Next Visit Plan continue to add as tolerated    Consulted and Agree with Plan of Care Patient             Patient will benefit from skilled therapeutic intervention in order to improve the following deficits and impairments:  Abnormal gait, Decreased range of motion, Difficulty walking, Decreased activity tolerance, Cardiopulmonary status limiting activity, Pain, Decreased balance, Improper body mechanics, Postural dysfunction, Decreased strength, Decreased mobility  Visit Diagnosis: Muscle weakness (generalized)  Difficulty walking  Unsteady gait  Other symptoms and signs involving the musculoskeletal system     Problem List Patient Active Problem List   Diagnosis Date Noted   Cellulitis of chest wall    Acute renal failure (HCC) 01/26/2017   Exacerbation of systemic lupus erythematosus ([place]) 01/26/2017   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 [place], [name], [address] Phone: [phone]   Fax:  [phone]  Name: SHAVON ASHMORE MRN: [id number] Date of Birth: 12/04/1990

## 2020-09-03 NOTE — Therapy (Signed)
[place]. [place], [name], [address] Phone: [phone]   Fax:  [phone]  Occupational Therapy Treatment  Patient Details  Name: Anne Robinson MRN: [id number] Date of Birth: September 15, 1990 Referring Provider (OT): Leandro Reasoner, MD   Encounter Date: 09/02/2020   OT End of Session - 09/02/20 1409     Visit Number 5    Number of Visits 17    Authorization Type Medicare ([place] secondary)    Authorization Time Period VL: 7/60 combined    Progress Note Due on Visit 10    OT Start Time 1359    OT Stop Time 1441    OT Time Calculation (min) 42 min    Activity Tolerance Patient tolerated treatment well    Behavior During Therapy [facility] for tasks assessed/performed             Past Medical History:  Diagnosis Date   HTN (hypertension) 05/15/2016   Lupus ([place])    Lupus nephritis ([place])    Pseudotumor cerebri    Renal disorder    Thyroid disease     Past Surgical History:  Procedure Laterality Date   RENAL BIOPSY      There were no vitals filed for this visit.   Subjective Assessment - 09/02/20 1400     Subjective  "That's further than I realized I could go" (referencing L shoulder flexion). Pt also reports feeling frustrated w/ decreased functional use of her RUE (dominant side)    Pertinent History ESRD (peritoneal dialysis; kidney transplant list); intracranial hypertension s/p VP shunt; hx of seizures and PE (currently on Eliquis); ORIF s/p bilateral shoulder fx    Patient Stated Goals "Get stronger;" improve functional use of BUEs    Currently in Pain? Yes    Pain Score 3     Pain Location Other (Comment)   back and R shoulder   Pain Descriptors / Indicators Sore;Aching            OT Treatment/Exercises - 09/02/20    Shoulder Exercises: Seated Single-arm rows w/ yellow theraband band 2x15 w/ each UE; able to achieve good shoulder extension on both sides with no additional pain    Shoulder  Exercises: Supine AAROM of BUE shoulder flexion using unweighted dowel rod; completed 3x10 w/ rest breaks between sets and OT providing additional support under proximal elbow, slightly elevating RUE off mat to encourage success/increased movement w/ positive results  AROM of R shoulder flexion w/ pt chest pressing up to tap target; completed 2 sets of 10 reps  OT-facilitated gentle PROM of R shoulder for increased mobility and to decrease stiffness. After initial repetitions, OT facilitated isometric contraction w/ pt attempting to hold UE position at mid-range of flexion before OT assisted with extension back to mat; slight activation palpated    Soft Tissue Mobilization Gentle soft tissue mobilization w/ lotion to lateral aspect of R upper arm to facilitate mobility within tissue structures and decrease tension, edema, and pain. Pt positioned supine on mat and tolerated intervention w/out discomfort         OT Education - 09/02/20 1621     Education Details Education provided on energy conservation strategies and MET levels during common daily activities for pt to attempt to incorporate at home due to pt's report of persistent fatigue.    Person(s) Educated Patient    Methods Explanation;Handout    Comprehension Verbalized understanding  OT Short Term Goals - 09/02/20 1635       OT SHORT TERM GOAL #1   Title Pt will verbalize understanding of at least 3 energy conservation strategies to implement during BADL/IADLs    Baseline Decreased endurance impacting daily activities    Time 4    Period Weeks    Status Achieved   09/02/20 - verbalized understanding   Target Date 09/11/20      OT SHORT TERM GOAL #2   Title Pt will demonstrate independence with initial HEP designed for BUE ROM    Baseline No HEP at this time    Time 4    Period Weeks    Status On-going      OT SHORT TERM GOAL #3   Title Pt will improve strength of R, dominant UE to at least 2+/5    Baseline  R shoulder flexion and abduction 2-/5    Time 4    Period Weeks    Status On-going      OT SHORT TERM GOAL #4   Title Pt will increase AROM of R and L shoulder flexion to at least 45 degrees to facilitate functional use of BUEs    Baseline No R shoulder flexion against gravity; L shoulder flexion to 26*    Time 4    Period Weeks    Status On-going             OT Long Term Goals - 08/17/20 1710       OT LONG TERM GOAL #1   Title Pt will be independent w/ full HEP designed for BUE ROM, bilateral coordination, and strength    Baseline No HEP at this time    Time 8    Period Weeks    Status On-going      OT LONG TERM GOAL #2   Title Pt will improve grip strength to 35# or greater in both hands to improve participation in IADLs    Baseline R hand 25#; L hand 23#    Time 8    Period Weeks    Status On-going      OT LONG TERM GOAL #3   Title Pt will be able to move through at least 50% of shoulder flexion to improve functional use of BUEs    Baseline No R shoulder flexion against gravity; L shoulder flexion to 26*    Time 8    Period Weeks    Status On-going      OT LONG TERM GOAL #4   Title Pt will be able to prepare a simple meal/snack w/ Mod I level, using AE/compensatory strategies prn in at least 1 trial    Baseline Currently not preparing meals    Time 8    Period Weeks    Status On-going      OT LONG TERM GOAL #5   Title Pt will improve BUE strength (shoulders/elbows) to at least 3-/5 to improve participation in IADL/leisure tasks    Baseline TBD - MMT to be further tested    Time 8    Period Weeks    Status On-going             Plan - 09/02/20 1629     Clinical Impression Statement OT continued to facilitate BUE ROM and strengthening exercises this session. Pt initially attempted shoulder flexion while seated w/ starting position of elbow extended and hand forward on knee; unable to complete against gravity, so OT repositioned pt to facilitate movement  in supine for benefit of scapular stabilization and gravity-minimized positioning w/ positive results. Pt completed BUE exercises as well as isolated RUE exercises due to limitations w/ functional use of dominant, R side. OT also incorporated soft tissue mobilization to RUE due to scar tissue, pain, and edema, which likely are contributing to limitation of movement.    OT Occupational Profile and History Detailed Assessment- Review of Records and additional review of physical, cognitive, psychosocial history related to current functional performance    Occupational performance deficits (Please refer to evaluation for details): ADL's;IADL's;Work;Leisure;Social Participation    Body Structure / Function / Physical Skills ADL;Decreased knowledge of use of DME;Strength;Pain;GMC;Balance;Body mechanics;UE functional use;ROM;IADL;Endurance;Coordination;Mobility;Gait    Rehab Potential Good    Clinical Decision Making Several treatment options, min-mod task modification necessary    Comorbidities Affecting Occupational Performance: Presence of comorbidities impacting occupational performance    Modification or Assistance to Complete Evaluation  Min-Moderate modification of tasks or assist with assess necessary to complete eval    OT Frequency 2x / week    OT Duration 8 weeks    OT Treatment/Interventions Self-care/ADL training;Moist Heat;DME and/or AE instruction;Splinting;Aquatic Therapy    Plan Continue A/AAROM and light strengthening for BUEs (gravity-assisted/minimized, supine and/or sidelying)    Consulted and Agree with Plan of Care Patient             Patient will benefit from skilled therapeutic intervention in order to improve the following deficits and impairments:   Body Structure / Function / Physical Skills: ADL, Decreased knowledge of use of DME, Strength, Pain, GMC, Balance, Body mechanics, UE functional use, ROM, IADL, Endurance, Coordination, Mobility, Gait   Visit Diagnosis: Muscle  weakness (generalized)  Other symptoms and signs involving the musculoskeletal system  Stiffness of right shoulder, not elsewhere classified    Problem List Patient Active Problem List   Diagnosis Date Noted   Cellulitis of chest wall    Acute renal failure (HCC) 01/26/2017   Exacerbation of systemic lupus erythematosus ([place]) 01/26/2017   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 [place], [name], [address] Phone: [phone]   Fax:  [phone]  Name: Anne Robinson MRN: [id number] Date of Birth: December 19, 1990

## 2020-09-09 ENCOUNTER — Ambulatory Visit: Payer: 59

## 2020-09-09 ENCOUNTER — Ambulatory Visit: Payer: 59 | Admitting: Occupational Therapy

## 2020-09-14 ENCOUNTER — Ambulatory Visit: Payer: 59 | Attending: Physical Medicine and Rehabilitation | Admitting: Occupational Therapy

## 2020-09-14 ENCOUNTER — Ambulatory Visit: Payer: 59

## 2020-09-16 ENCOUNTER — Ambulatory Visit: Payer: 59 | Admitting: Occupational Therapy

## 2020-09-16 ENCOUNTER — Ambulatory Visit: Payer: 59 | Admitting: Physical Therapy

## 2020-09-21 ENCOUNTER — Ambulatory Visit: Payer: 59 | Admitting: Occupational Therapy

## 2020-09-21 ENCOUNTER — Ambulatory Visit: Payer: 59

## 2020-09-23 ENCOUNTER — Ambulatory Visit: Payer: 59 | Admitting: Occupational Therapy

## 2020-09-23 ENCOUNTER — Ambulatory Visit: Payer: 59

## 2020-10-05 ENCOUNTER — Other Ambulatory Visit: Payer: Self-pay

## 2020-10-05 ENCOUNTER — Ambulatory Visit: Payer: 59 | Attending: Physical Medicine and Rehabilitation | Admitting: Physical Therapy

## 2020-10-05 ENCOUNTER — Encounter: Payer: Self-pay | Admitting: Physical Therapy

## 2020-10-05 ENCOUNTER — Ambulatory Visit: Payer: 59 | Admitting: Occupational Therapy

## 2020-10-05 ENCOUNTER — Encounter: Payer: Self-pay | Admitting: Occupational Therapy

## 2020-10-05 DIAGNOSIS — R2681 Unsteadiness on feet: Secondary | ICD-10-CM | POA: Diagnosis present

## 2020-10-05 DIAGNOSIS — M25612 Stiffness of left shoulder, not elsewhere classified: Secondary | ICD-10-CM

## 2020-10-05 DIAGNOSIS — R29898 Other symptoms and signs involving the musculoskeletal system: Secondary | ICD-10-CM

## 2020-10-05 DIAGNOSIS — M25611 Stiffness of right shoulder, not elsewhere classified: Secondary | ICD-10-CM | POA: Diagnosis present

## 2020-10-05 DIAGNOSIS — M6281 Muscle weakness (generalized): Secondary | ICD-10-CM | POA: Diagnosis not present

## 2020-10-05 DIAGNOSIS — R262 Difficulty in walking, not elsewhere classified: Secondary | ICD-10-CM | POA: Insufficient documentation

## 2020-10-05 NOTE — Patient Instructions (Signed)
Access Code: [email or web address] URL: [email or web address] Date: 10/05/2020 Prepared by: Grayling Congress  Exercises Standing Tandem Balance with Counter Support - 2 x daily - 7 x weekly - 2 sets - 30 sec hold Standing Single Leg Stance with Counter Support - 2 x daily - 7 x weekly - 2 sets - 30 sec hold Standing Toe Taps - 2 x daily - 7 x weekly - 2 sets - 10 reps

## 2020-10-05 NOTE — Therapy (Signed)
[place]. [place], [name], [address] Phone: [phone]   Fax:  [phone]  Occupational Therapy Treatment/Recertification  Patient Details  Name: Anne Robinson MRN: [id number] Date of Birth: Feb 19, 1991 Referring Provider (OT): Leandro Reasoner, MD   Encounter Date: 10/05/2020   OT End of Session - 10/05/20 1627     Visit Number 6    Number of Visits 17    Date for OT Re-Evaluation 11/04/20    Authorization Type UHC (primary); Medicare (secondary)    Authorization Time Period VL: 10/60 combined    Progress Note Due on Visit --    OT Start Time 1615    OT Stop Time 1700    OT Time Calculation (min) 45 min    Activity Tolerance Patient tolerated treatment well    Behavior During Therapy [facility] for tasks assessed/performed             Past Medical History:  Diagnosis Date   HTN (hypertension) 05/15/2016   Lupus ([place])    Lupus nephritis ([place])    Pseudotumor cerebri    Renal disorder    Thyroid disease     Past Surgical History:  Procedure Laterality Date   RENAL BIOPSY      There were no vitals filed for this visit.   Subjective Assessment - 10/05/20 1619     Subjective  Pt reports increased pain in her R shoulder recently that started about a week before ED visit on 09/12/20. Pt also feels like BUE strength has gotten worse since taking a break from therapy and was tearful when expressing frustration about this.    Pertinent History ESRD (peritoneal dialysis; kidney transplant list); intracranial hypertension s/p VP shunt; hx of seizures and PE (currently on Eliquis); ORIF s/p bilateral shoulder fx    Patient Stated Goals "Get stronger;" improve functional use of BUEs    Currently in Pain? Yes    Pain Score 6     Pain Location Shoulder    Pain Orientation Right    Pain Descriptors / Indicators Aching   Occasional sharp pain, particularly when attempting movement   Pain Type Acute pain              [facility] OT Assessment - 10/05/20 1628       AROM   Overall AROM  Deficits   AROM deficits in shoulders; bilateral elbow, wrist, hand WFL   Overall AROM Comments Shoulder AROM assessed in supine    Right Shoulder Flexion 0 Degrees    Right Shoulder ABduction 58 Degrees    Right Shoulder External Rotation 9 Degrees    Left Shoulder Flexion 21 Degrees    Left Shoulder ABduction 66 Degrees    Left Shoulder External Rotation 26 Degrees      Strength   Right Shoulder Flexion 2-/5    Right Shoulder ABduction 2-/5    Left Shoulder Flexion 2-/5    Left Shoulder ABduction 2-/5      Hand Function   Right Hand Grip (lbs) 30#    Left Hand Grip (lbs) 27#             Treatment/Exercises - 10/05/20    ROM: Supine Closed-chain BUE chest press and L shoulder abduction w/ unweighted dowel rod, bilateral elbow flexion ROM/strengthening w/ 2# dowel; unable to complete R shoulder abduction or bilateral shoulder flexion due to pain. Pt positioned in supine to provide support at shoulder girdle/scapula and for benefit of gravity-minimized positioning  Gentle OT-facilitated PROM of shoulder flexion within pain-free ROM; pt able to achieve functional arc of motion w/ each UE. To grade activity up, OT facilitated pt activation of shoulder to maintain position at 90* before returning to starting position; completed 5 sets slowly. Also facilitated pt completing small arc of movement to lift UE off support and control decent back to support. Able to complete exercises w/ each UE w/out add'l pain                OT Education - 10/05/20 1800    Education Details Discussed updated POC and new/adjusted pt stated goals   Person(s) Educated Patient   Methods Explanation   Comprehension Verbalized understanding             OT Short Term Goals - 10/05/20 1801       OT SHORT TERM GOAL #1   Title Pt will verbalize understanding of at least 3 energy conservation strategies to implement during  BADL/IADLs    Baseline Decreased endurance impacting daily activities    Time 4    Period Weeks    Status Achieved   09/02/20 - verbalized understanding   Target Date 11/02/20      OT SHORT TERM GOAL #2   Title Pt will demonstrate independence with initial HEP designed for BUE ROM    Baseline Initiated HEP for BUE ROM    Time 4    Period Weeks    Status On-going      OT SHORT TERM GOAL #3   Title Pt will improve strength of R, dominant UE shoulder flexion to at least 2/5    Baseline R shoulder flexion 2-/5    Time 4    Period Weeks    Status Revised      OT SHORT TERM GOAL #4   Title Pt will increase AROM of R and L shoulder flexion to greater than 50% of ROM in gravity-minimized position to facilitate functional use of BUEs    Baseline Less than 50% of bilateral shoulder flexion against gravity    Time 4    Period Weeks    Status Revised             OT Long Term Goals - 10/05/20 1807       OT LONG TERM GOAL #1   Title Pt will be independent w/ full HEP designed for BUE ROM and strength    Baseline Initial HEP for ROM    Time 8    Period Weeks    Status On-going    Target Date 11/30/20      OT LONG TERM GOAL #2   Title Pt will improve grip strength to 35# or greater in both hands to improve participation in IADLs    Baseline R hand 25#; L hand 23#    Time 8    Period Weeks    Status On-going      OT LONG TERM GOAL #3   Title Pt will demonstrate at least 25% of R shoulder flexion against gravity to improve functional use of dominant UE    Baseline No R shoulder flexion against gravity    Time 8    Period Weeks    Status Revised      OT LONG TERM GOAL #4   Title Pt will be able to prepare a simple meal/snack w/ Mod I level, using AE/compensatory strategies prn in at least 1 trial    Baseline Currently not preparing meals  Time 8    Period Weeks    Status On-going      OT LONG TERM GOAL #5   Title Pt will improve strength of BUE shoulder flexion to at  least 2+/5 to improve participation in IADL/leisure tasks    Baseline 10/06/20 - RUE and LUE 2-/5    Time 8    Period Weeks    Status Revised             Plan - 10/05/20 1639     Clinical Impression Statement Pt returns to OT today after lapse in therapy due to medical considerations/complications; previous OT session was 09/02/20. Since then, pt was hospitalized due to a suspected infection and cancelled a scheduled surgery 2/2 add'l medical complications. Pt expressed frustration/disappointment due to pain and purported decrease in UE strength during hiatus; OT encouraged pt to seek add'l care/counseling prn and pt was receptive. Pt did show improvements in functional mobility, as she was able to ambulate into session w/out AD, and demo'd improved grip strength bilaterally. Pt will benefit from continued skilled OT services for an add'l 8 weeks to address BUE ROM, strength, and functional use.    OT Occupational Profile and History Detailed Assessment- Review of Records and additional review of physical, cognitive, psychosocial history related to current functional performance    Occupational performance deficits (Please refer to evaluation for details): ADL's;IADL's;Work;Leisure;Social Participation    Body Structure / Function / Physical Skills ADL;Decreased knowledge of use of DME;Strength;Pain;GMC;Balance;Body mechanics;UE functional use;ROM;IADL;Endurance;Coordination;Mobility;Gait    Rehab Potential Good    Clinical Decision Making Several treatment options, min-mod task modification necessary    Comorbidities Affecting Occupational Performance: Presence of comorbidities impacting occupational performance    Modification or Assistance to Complete Evaluation  Min-Moderate modification of tasks or assist with assess necessary to complete eval    OT Frequency 2x / week    OT Duration 8 weeks    OT Treatment/Interventions Self-care/ADL training;Moist Heat;DME and/or AE  instruction;Splinting;Aquatic Therapy;Therapeutic activities;Therapeutic exercise;Ultrasound;Neuromuscular education;Functional Mobility Training;Passive range of motion;Patient/family education;Manual Therapy;Energy conservation    Plan Continue A/AAROM and light strengthening for BUEs (gravity-assisted/minimized, supine and/or sidelying)    Consulted and Agree with Plan of Care Patient             Patient will benefit from skilled therapeutic intervention in order to improve the following deficits and impairments:   Body Structure / Function / Physical Skills: ADL, Decreased knowledge of use of DME, Strength, Pain, GMC, Balance, Body mechanics, UE functional use, ROM, IADL, Endurance, Coordination, Mobility, Gait   Visit Diagnosis: Stiffness of right shoulder, not elsewhere classified  Stiffness of left shoulder, not elsewhere classified  Muscle weakness (generalized)  Other symptoms and signs involving the musculoskeletal system    Problem List Patient Active Problem List   Diagnosis Date Noted   Cellulitis of chest wall    Acute renal failure (HCC) 01/26/2017   Exacerbation of systemic lupus erythematosus ([place]) 01/26/2017   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 [place], [name], [address] Phone: [phone]   Fax:  [phone]  Name: SHERESE TATTON MRN: [id number] Date of Birth: [birth year]

## 2020-10-05 NOTE — Therapy (Signed)
[address]. [place], [name], [address] Phone: [phone]   Fax:  [phone]  Physical Therapy Treatment  Patient Details  Name: Anne Robinson MRN: [id number] Date of Birth: 04/29/1990 Referring Provider (PT): Aundra Dubin   Encounter Date: 10/05/2020   PT End of Session - 10/05/20 1615     Visit Number 6    Number of Visits 22    Date for PT Re-Evaluation 11/30/20    Authorization Type 60 combined visits    PT Start Time 1534    PT Stop Time 1616   hot pack   PT Time Calculation (min) 42 min    Equipment Utilized During Treatment Gait belt    Activity Tolerance Patient tolerated treatment well;Patient limited by fatigue;Patient limited by pain    Behavior During Therapy [facility] for tasks assessed/performed   pt became emotional during session d/t feeling a lack of progress            Past Medical History:  Diagnosis Date   HTN (hypertension) 05/15/2016   Lupus ([place])    Lupus nephritis ([place])    Pseudotumor cerebri    Renal disorder    Thyroid disease     Past Surgical History:  Procedure Laterality Date   RENAL BIOPSY      There were no vitals filed for this visit.   Subjective Assessment - 10/05/20 1534     Subjective Was in the hospital for a week and was supposed to have surgery, however something came up. Reports that she had a fever one day when on dialysis- they thought she had an infection. Has been walking more without the rollator however reports still having pain in her arms.    Pertinent History ESRD. Lupus, HTN    Patient Stated Goals be stronger, walk, cook, shop    Currently in Pain? Yes    Pain Score 5     Pain Location Shoulder    Pain Orientation Right;Left    Pain Descriptors / Indicators Aching    Pain Type Acute pain    Multiple Pain Sites Yes    Pain Score 5    Pain Location Back    Pain Orientation Lower    Pain Descriptors / Indicators Aching    Pain Type Acute pain                 [facility] PT Assessment - 10/05/20 0001       Assessment   Medical Diagnosis weakness, unsteady walking, diff walking    Referring Provider (PT) Aundra Dubin    Onset Date/Surgical Date 02/10/20      Strength   Right Hip Flexion 4/5    Right Hip External Rotation  4/5    Right Hip Internal Rotation 4-/5    Right Hip ABduction 4/5    Right Hip ADduction 5/5    Left Hip Flexion 4/5    Left Hip External Rotation 4/5    Left Hip Internal Rotation 4-/5    Left Hip ABduction 4/5    Left Hip ADduction 4+/5      Berg Balance Test   Sit to Stand Able to stand without using hands and stabilize independently    Standing Unsupported Able to stand safely 2 minutes    Sitting with Back Unsupported but Feet Supported on Floor or Stool Able to sit safely and securely 2 minutes    Stand to Sit Sits safely with minimal use of hands  Transfers Able to transfer safely, minor use of hands    Standing Unsupported with Eyes Closed Able to stand 10 seconds safely    Standing Unsupported with Feet Together Able to place feet together independently and stand 1 minute safely    From Standing, Reach Forward with Outstretched Arm Can reach forward >5 cm safely (2")   unable to use hands to reach   From Standing Position, Pick up Object from [place] to pick up shoe safely and easily    From Standing Position, Turn to Look Behind Over each Shoulder Looks behind from both sides and weight shifts well    Turn 360 Degrees Able to turn 360 degrees safely in 4 seconds or less    Standing Unsupported, Alternately Place Feet on Step/Stool Able to stand independently and safely and complete 8 steps in 20 seconds    Standing Unsupported, One Foot in Front Able to take small step independently and hold 30 seconds    Standing on One Leg Able to lift leg independently and hold > 10 seconds    Total Score 52      Timed Up and Go Test   Normal TUG (seconds) 13.15   no AD                           [facility] Adult PT Treatment/Exercise - 10/05/20 0001       Shoulder Exercises: Supine   External Rotation AROM;Strengthening;Right;Left;20 reps;Theraband    Flexion AAROM;Both;Limitations;5 reps    Flexion Limitations with wand and light PT assistance      Moist Heat Therapy   Number Minutes Moist Heat 10 Minutes    Moist Heat Location Lumbar Spine                    PT Education - 10/05/20 1614     Education Details update to HEP; discussion on objective progress and remaining impairments    Person(s) Educated Patient    Methods Explanation;Demonstration;Tactile cues;Verbal cues;Handout    Comprehension Verbalized understanding;Returned demonstration              PT Short Term Goals - 10/05/20 1627       PT SHORT TERM GOAL #1   Title independent with initial HEP    Time 3    Status Partially Met    Target Date 10/26/20               PT Long Term Goals - 10/05/20 1627       PT LONG TERM GOAL #1   Title walk 300 feet with LRAD    Time 8    Period Weeks    Status On-going   not tested   Target Date 11/30/20      PT LONG TERM GOAL #2   Title decrease TUG time to 20 seconds    Status Achieved      PT LONG TERM GOAL #3   Title increase berg balance score to 48/56    Status Achieved      PT LONG TERM GOAL #4   Title stand to cook a meal without diffiuclty    Time 8    Status Partially Met   able to make coffee   Target Date 11/30/20      PT LONG TERM GOAL #5   Title increase hip strength to 4/5    Time 8    Status On-going    Target Date  11/30/20                   Plan - 10/05/20 1621     Clinical Impression Statement Patient ambulated into session without AD today. Noted that she was hospitalized for a week d/t a suspected infection. Feels that she has declined during this time away from therapy as a result. Patient does report that she is able to stand to make coffee on her own. Berg and TUG  scores have improved considerably, placing patient at a decreased risk of falls. Patient did however require frequent sitting/supine rest breaks to complete Berg assessment d/t c/o LBP. Educated patient on balance HEP to be performed at counter top for safety- patient reported understanding. Ended session with moist heat to LB to address pain. Patient is demonstrating good progress towards goals. Would benefit from additional skilled PT services 1-2x/week for 8 weeks to address aforementioned impairments.    Personal Factors and Comorbidities Comorbidity 3+    Comorbidities ESRD, HTN, Lupus    Rehab Potential Good    PT Frequency 2x / week   1-2x   PT Duration 8 weeks    PT Treatment/Interventions ADLs/Self Care Home Management;Electrical Stimulation;Moist Heat;Gait training;Neuromuscular re-education;Balance training;Therapeutic exercise;Therapeutic activities;Functional mobility training;Stair training;Patient/family education;Manual techniques;Cryotherapy;Ultrasound;Iontophoresis 53m/ml Dexamethasone;Passive range of motion;Dry needling;Energy conservation;Vasopneumatic Device;Taping    PT Next Visit Plan higher level balance, core and hip strengthening    Consulted and Agree with Plan of Care Patient             Patient will benefit from skilled therapeutic intervention in order to improve the following deficits and impairments:  Abnormal gait, Decreased range of motion, Difficulty walking, Decreased activity tolerance, Cardiopulmonary status limiting activity, Pain, Decreased balance, Improper body mechanics, Postural dysfunction, Decreased strength, Decreased mobility  Visit Diagnosis: Muscle weakness (generalized)  Difficulty walking  Unsteady gait  Other symptoms and signs involving the musculoskeletal system     Problem List Patient Active Problem List   Diagnosis Date Noted   Cellulitis of chest wall    Acute renal failure ([place] 01/26/2017   Exacerbation of systemic lupus  erythematosus ([place] 01/26/2017   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 05/15/2016   Elevated troponin 05/15/2016   HTN (hypertension) 05/15/2016   SLE (systemic lupus erythematosus) ([place] 05/15/2016     YJanene Harvey PT, DPT 10/05/20 4:31 PM    [place] [address]Phone: [phone]  Fax:  [phone] Name: Anne BOOZEMRN: [id number] of Birth: 310/18/1992

## 2020-10-07 ENCOUNTER — Ambulatory Visit: Payer: 59 | Admitting: Occupational Therapy

## 2020-10-07 ENCOUNTER — Ambulatory Visit: Payer: 59 | Admitting: Physical Therapy

## 2020-10-08 ENCOUNTER — Encounter (HOSPITAL_BASED_OUTPATIENT_CLINIC_OR_DEPARTMENT_OTHER): Payer: Self-pay | Admitting: *Deleted

## 2020-10-08 ENCOUNTER — Emergency Department (HOSPITAL_BASED_OUTPATIENT_CLINIC_OR_DEPARTMENT_OTHER)
Admission: EM | Admit: 2020-10-08 | Discharge: 2020-10-08 | Disposition: A | Discharge status: Home / Self Care | Payer: 59 | Attending: Emergency Medicine | Admitting: Emergency Medicine

## 2020-10-08 ENCOUNTER — Emergency Department (HOSPITAL_BASED_OUTPATIENT_CLINIC_OR_DEPARTMENT_OTHER): Payer: 59

## 2020-10-08 ENCOUNTER — Other Ambulatory Visit: Payer: Self-pay

## 2020-10-08 DIAGNOSIS — R222 Localized swelling, mass and lump, trunk: Secondary | ICD-10-CM | POA: Insufficient documentation

## 2020-10-08 DIAGNOSIS — T82598A Other mechanical complication of other cardiac and vascular devices and implants, initial encounter: Secondary | ICD-10-CM | POA: Insufficient documentation

## 2020-10-08 DIAGNOSIS — Y828 Other medical devices associated with adverse incidents: Secondary | ICD-10-CM | POA: Insufficient documentation

## 2020-10-08 DIAGNOSIS — I10 Essential (primary) hypertension: Secondary | ICD-10-CM | POA: Diagnosis not present

## 2020-10-08 DIAGNOSIS — R0789 Other chest pain: Secondary | ICD-10-CM | POA: Diagnosis present

## 2020-10-08 NOTE — Discharge Instructions (Addendum)
Your x-ray looked normal.  The dialysis catheter looks to be in good position and intact.  As we discussed, I would like for you to call your primary care doctor to discuss your area of swelling and have them schedule a breast ultrasound.  I cannot do this from the emergency department, unfortunately.  If you develop fevers or worsening swelling please return to the emergency department but this does not appear to be from infection at this time.

## 2020-10-08 NOTE — ED Provider Notes (Signed)
Emergency Department Provider Note   I have reviewed the triage vital signs and the nursing notes.   HISTORY  Chief Complaint Vascular Access Problem   HPI Anne Robinson is a 30 y.o. female presents to the ED with chest wall pain starting today. She went to her normal HD session without difficulty. No pain with HD cath use. Noted some right medial breast/chest wall tenderness and swelling. No drainage. No fever. No chills. No history of similar in the past. No radiation of symptoms or modifying factors.    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  Social History Social History   Tobacco Use   Smoking status: Never   Smokeless tobacco: Never  Vaping Use   Vaping Use: Never used  Substance Use Topics   Alcohol use: No   Drug use: No    Review of Systems  Constitutional: No fever/chills Eyes: No visual changes. ENT: No sore throat. Cardiovascular: Denies chest pain. Respiratory: Denies shortness of breath. Gastrointestinal: No abdominal pain.  No nausea, no vomiting.  No diarrhea.  No  constipation. Genitourinary: Negative for dysuria. Musculoskeletal: Negative for back pain. Skin: Negative for rash. Positive chest wall lump.  Neurological: Negative for headaches, focal weakness or numbness.  10-point ROS otherwise negative.  ____________________________________________   PHYSICAL EXAM:  VITAL SIGNS: ED Triage Vitals  Enc Vitals Group     BP 10/08/20 1937 (!) 118/91     Pulse Rate 10/08/20 1937 (!) 111     Resp 10/08/20 1937 20     Temp 10/08/20 1937 99.4 F (37.4 C)     Temp Source 10/08/20 1937 Oral     SpO2 10/08/20 1937 94 %     Weight 10/08/20 1940 160 lb (72.6 kg)     Height 10/08/20 1940 '4\' 11"'$  (1.499 m)   Constitutional: Alert and oriented. Well appearing and in no acute distress. Eyes: Conjunctivae are normal. PERRL. EOMI. Head: Atraumatic. Nose: No congestion/rhinnorhea. Mouth/Throat: Mucous membranes are moist.  Neck: No stridor. Cardiovascular: Normal rate, regular rhythm. Good peripheral circulation. Grossly normal heart sounds. HD cath is well appearing. No surrounding swelling or drainage.  Respiratory: Normal respiratory effort.  No retractions. Lungs CTAB. Gastrointestinal: Soft and nontender. No distention.  Musculoskeletal: No lower extremity tenderness nor edema. No gross deformities of extremities. Neurologic:  Normal speech and language. No gross focal neurologic deficits are appreciated.  Skin: Medial area noted to the right medial breast approx 2 cm in diameter. No erythema or warmth. No fluctuance.   ____________________________________________  RADIOLOGY  CXR reviewed.   ____________________________________________   PROCEDURES  Procedure(s) performed:   Procedures  None ____________________________________________   INITIAL IMPRESSION / ASSESSMENT AND PLAN / ED COURSE  Pertinent labs & imaging results that were available during my care of the patient were reviewed by me and considered in my medical decision  making (see chart for details).    Patient presents to the ED with breast mass. Exam not consistent with breast abscess/cellulitis. HD cath working well today. Exam not consistent with line infection. Line in good position on CXR. I do not have the ability to schedule breast imaging from the ED. Patient to call her PCP in the AM to schedule outpatient [place] at the breast imaging center. Patient advises that she will call first thing tomorrow. Doubt malignant etiology with area appearing suddenly. No indication for abx at this time but could be early infection although without other signs/symptoms feel that risk of abx outweighed by potential benefit.    ____________________________________________  FINAL CLINICAL IMPRESSION(S) / ED DIAGNOSES  Final diagnoses:  Mass of chest wall, right      Note:  This document was prepared using Dragon voice recognition software and may include unintentional dictation errors.  Nanda Quinton, MD, [facility] Emergency Medicine    Kilah Drahos, Wonda [facility], MD 10/13/20 [phone]

## 2020-10-08 NOTE — ED Triage Notes (Signed)
She is a dialysis pt with a dialysis catheter in her right chest. She has a hard spot to his left side of her catheter. Last dialysis was today.

## 2020-10-09 ENCOUNTER — Ambulatory Visit: Payer: 59 | Admitting: Occupational Therapy

## 2020-10-09 ENCOUNTER — Encounter: Payer: Self-pay | Admitting: Occupational Therapy

## 2020-10-09 ENCOUNTER — Encounter: Payer: Self-pay | Admitting: Physical Therapy

## 2020-10-09 ENCOUNTER — Ambulatory Visit: Payer: 59 | Admitting: Physical Therapy

## 2020-10-09 DIAGNOSIS — M6281 Muscle weakness (generalized): Secondary | ICD-10-CM

## 2020-10-09 DIAGNOSIS — R262 Difficulty in walking, not elsewhere classified: Secondary | ICD-10-CM

## 2020-10-09 DIAGNOSIS — R29898 Other symptoms and signs involving the musculoskeletal system: Secondary | ICD-10-CM

## 2020-10-09 DIAGNOSIS — R2681 Unsteadiness on feet: Secondary | ICD-10-CM

## 2020-10-09 DIAGNOSIS — M25612 Stiffness of left shoulder, not elsewhere classified: Secondary | ICD-10-CM

## 2020-10-09 DIAGNOSIS — M25611 Stiffness of right shoulder, not elsewhere classified: Secondary | ICD-10-CM

## 2020-10-09 NOTE — Therapy (Signed)
[address]. [place], [name], [address] Phone: [phone]   Fax:  [phone]  Physical Therapy Treatment  Patient Details  Name: Anne Robinson MRN: [id number] Date of Birth: 1991-01-12 Referring Provider (PT): [place] Chard Date: 10/09/2020   PT End of Session - 10/09/20 1144     Visit Number 7    Number of Visits 22    Date for PT Re-Evaluation 11/30/20    Authorization Type 60 combined visits    PT Start Time 1101    PT Stop Time 1150   moist heat   PT Time Calculation (min) 49 min    Equipment Utilized During Treatment Gait belt    Activity Tolerance Patient tolerated treatment well;Patient limited by fatigue;Patient limited by pain    Behavior During Therapy [facility] for tasks assessed/performed             Past Medical History:  Diagnosis Date   HTN (hypertension) 05/15/2016   Lupus ([place])    Lupus nephritis ([place])    Pseudotumor cerebri    Renal disorder    Thyroid disease     Past Surgical History:  Procedure Laterality Date   INSERTION OF DIALYSIS CATHETER     RENAL BIOPSY     SHOULDER SURGERY      There were no vitals filed for this visit.   Subjective Assessment - 10/09/20 1102     Subjective Back was hurting a bit more yesterday- has not done a whole lot yet today. Balance HEP went well at home.    Pertinent History ESRD. Lupus, HTN    Patient Stated Goals be stronger, walk, cook, shop    Currently in Pain? Yes    Pain Score 5     Pain Score 5    Pain Location Back    Pain Orientation Lower    Pain Descriptors / Indicators Aching    Pain Type Acute pain                               [facility] Adult PT Treatment/Exercise - 10/09/20 0001       Exercises   Exercises Knee/Hip;Lumbar      Lumbar Exercises: Supine   Other Supine Lumbar Exercises LTR 20x   performed within comfortable range     Knee/Hip Exercises: Aerobic   Nustep L1 x 2.5 min LE/UE, L5 x  3.5 min LEs only d/t c/o arm pain   handles on 10 for ROM     Knee/Hip Exercises: Standing   Hip Flexion Stengthening;Both;1 set;10 reps;Knee bent    Hip Flexion Limitations alt resisted march    Functional Squat 1 set;10 reps    Functional Squat Limitations mini squat   good form     Knee/Hip Exercises: Seated   Sit to Sand 1 set;10 reps;without UE support   red TB above knees; cues to decrease eccentric speed     Knee/Hip Exercises: Supine   Bridges Strengthening;10 reps;2 sets    Bridges Limitations 2x10; 2nd set with red TB      Knee/Hip Exercises: Sidelying   Clams 10x each side with yellow TB      Moist Heat Therapy   Number Minutes Moist Heat 10 Minutes    Moist Heat Location Shoulder   R                Balance Exercises - 10/09/20 0001  Balance Exercises: Standing   Tandem Stance Eyes open;1 rep;30 secs   in II bars, 2 finger support   SLS Eyes open   15x each foot rolling ball fwd/back in II bars with B UE support   Other Standing Exercises Comments alt toe tap on cone x20, x2 taps with 2 finger support x10   rest break d/t fatigue              PT Education - 10/09/20 1141     Education Details update to HEP    Person(s) Educated Patient    Methods Explanation;Demonstration;Tactile cues;Verbal cues;Handout    Comprehension Verbalized understanding;Returned demonstration              PT Short Term Goals - 10/09/20 1148       PT SHORT TERM GOAL #1   Title independent with initial HEP    Time 3    Status Achieved    Target Date 10/26/20               PT Long Term Goals - 10/05/20 1627       PT LONG TERM GOAL #1   Title walk 300 feet with LRAD    Time 8    Period Weeks    Status On-going   not tested   Target Date 11/30/20      PT LONG TERM GOAL #2   Title decrease TUG time to 20 seconds    Status Achieved      PT LONG TERM GOAL #3   Title increase berg balance score to 48/56    Status Achieved      PT LONG TERM  GOAL #4   Title stand to cook a meal without diffiuclty    Time 8    Status Partially Met   able to make coffee   Target Date 11/30/20      PT LONG TERM GOAL #5   Title increase hip strength to 4/5    Time 8    Status On-going    Target Date 11/30/20                   Plan - 10/09/20 1144     Clinical Impression Statement Patient arrived to session without new complaints. Noting good tolerance for HEP and noted no issues. Worked on progressive core and hip strengthening with patient reporting muscular fatigue and challenge, however with good effort and tolerance. Updated HEP with these exercises for continued practice- patient reported understanding. Patient performed static and dynamic balance training with varied UE support required. Most challenge was demonstrated with SLS activities, with encouragement required to remove UE support for increased challenge. Ended session with moist heat to R shoulder for post-exercise soreness. No further complaints at end of session. Patient is progressing well towards goals.    Personal Factors and Comorbidities Comorbidity 3+    Comorbidities ESRD, HTN, Lupus    Rehab Potential Good    PT Frequency 2x / week   1-2x   PT Duration 8 weeks    PT Treatment/Interventions ADLs/Self Care Home Management;Electrical Stimulation;Moist Heat;Gait training;Neuromuscular re-education;Balance training;Therapeutic exercise;Therapeutic activities;Functional mobility training;Stair training;Patient/family education;Manual techniques;Cryotherapy;Ultrasound;Iontophoresis 25m/ml Dexamethasone;Passive range of motion;Dry needling;Energy conservation;Vasopneumatic Device;Taping    PT Next Visit Plan higher level balance, core and hip strengthening    Consulted and Agree with Plan of Care Patient             Patient will benefit from skilled therapeutic intervention in order to improve the  following deficits and impairments:  Abnormal gait, Decreased range of  motion, Difficulty walking, Decreased activity tolerance, Cardiopulmonary status limiting activity, Pain, Decreased balance, Improper body mechanics, Postural dysfunction, Decreased strength, Decreased mobility  Visit Diagnosis: Muscle weakness (generalized)  Other symptoms and signs involving the musculoskeletal system  Difficulty walking  Unsteady gait     Problem List Patient Active Problem List   Diagnosis Date Noted   Cellulitis of chest wall    Acute renal failure ([place]) 01/26/2017   Exacerbation of systemic lupus erythematosus ([place]) 01/26/2017   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 [place], [name], [address] Phone: [phone]   Fax:  [phone]  Name: Anne Robinson MRN: [id number] Date of Birth: 1990-04-10

## 2020-10-09 NOTE — Therapy (Signed)
[place]. [place], [name], [address] Phone: [phone]   Fax:  [phone]  Occupational Therapy Treatment  Patient Details  Name: Anne Robinson MRN: [id number] Date of Birth: [birth year] Referring Provider (OT): Leandro Reasoner, MD   Encounter Date: 10/09/2020   OT End of Session - 10/09/20 1024     Visit Number 7    Number of Visits 17    Date for OT Re-Evaluation 11/04/20    Authorization Type UHC (primary); Medicare (secondary)    Authorization Time Period VL: 11/60 combined    OT Start Time 1017    OT Stop Time 1058    OT Time Calculation (min) 41 min    Activity Tolerance Patient tolerated treatment well    Behavior During Therapy WFL for tasks assessed/performed             Past Medical History:  Diagnosis Date   HTN (hypertension) 05/15/2016   Lupus ([place])    Lupus nephritis ([place])    Pseudotumor cerebri    Renal disorder    Thyroid disease     Past Surgical History:  Procedure Laterality Date   INSERTION OF DIALYSIS CATHETER     RENAL BIOPSY     SHOULDER SURGERY      There were no vitals filed for this visit.   Subjective Assessment - 10/09/20 1022     Subjective  Pt reports she was in the ED last night, but was d/c quickly and instructed to follow up w/ her PCP    Pertinent History ESRD (peritoneal dialysis; kidney transplant list); intracranial hypertension s/p VP shunt; hx of seizures and PE (currently on Eliquis); ORIF s/p bilateral shoulder fx    Patient Stated Goals "Get stronger;" improve functional use of BUEs    Currently in Pain? Yes    Pain Score 5     Pain Location Shoulder    Pain Orientation Right    Pain Descriptors / Indicators Aching    Pain Type Chronic pain    Pain Onset More than a month ago    Pain Frequency Intermittent             Treatment/Exercises - 10/09/20    Shoulder ROM: Supine  OT-facilitated gentle PROM of shoulder flexion within pain-free  ROM; pt able to achieve functional arc of motion w/ each UE. To grade activity up, OT facilitated pt activation of shoulder to maintain position at 90* before returning to starting position; completed 5 sets slowly. Due to improved strength exhibited of L shoulder flexion this session, pt completed first ~50 degrees of AROM each rep. Able to complete exercises w/ each UE w/out add'l pain  OT-facilitated gentle PROM of R shoulder abduction to approx 90 degrees w/out add'l pain; pt unable to hold position w/out support         OT Education - 10/09/20 1542    Education Details  Continued condition-specific education regarding decreased functional use of bilateral shoulders   Person(s) Education Patient   Methods Explanation; Demonstration   Comprehension Verbalized understanding          OT Short Term Goals - 10/09/20 1026       OT SHORT TERM GOAL #1   Title Pt will verbalize understanding of at least 3 energy conservation strategies to implement during BADL/IADLs    Baseline Decreased endurance impacting daily activities    Time 4    Period Weeks    Status Achieved   09/02/20 -  verbalized understanding   Target Date 11/02/20      OT SHORT TERM GOAL #2   Title Pt will demonstrate independence with initial HEP designed for BUE ROM    Baseline Initiated HEP for BUE ROM    Time 4    Period Weeks    Status On-going      OT SHORT TERM GOAL #3   Title Pt will improve strength of R, dominant UE shoulder flexion to at least 2/5    Baseline R shoulder flexion 2-/5    Time 4    Period Weeks    Status On-going      OT SHORT TERM GOAL #4   Title Pt will increase AROM of R and L shoulder flexion to greater than 50% of ROM in gravity-minimized position to facilitate functional use of BUEs    Baseline Less than 50% of bilateral shoulder flexion against gravity    Time 4    Period Weeks    Status On-going             OT Long Term Goals - 10/09/20 1026       OT LONG TERM GOAL #1    Title Pt will be independent w/ full HEP designed for BUE ROM and strength    Baseline Initial HEP for ROM    Time 8    Period Weeks    Status On-going      OT LONG TERM GOAL #2   Title Pt will improve grip strength to 35# or greater in both hands to improve participation in IADLs    Baseline R hand 25#; L hand 23#    Time 8    Period Weeks    Status On-going      OT LONG TERM GOAL #3   Title Pt will demonstrate at least 25% of R shoulder flexion against gravity to improve functional use of dominant UE    Baseline No R shoulder flexion against gravity    Time 8    Period Weeks    Status On-going      OT LONG TERM GOAL #4   Title Pt will be able to prepare a simple meal/snack w/ Mod I level, using AE/compensatory strategies prn in at least 1 trial    Baseline Currently not preparing meals    Time 8    Period Weeks    Status On-going      OT LONG TERM GOAL #5   Title Pt will improve strength of BUE shoulder flexion to at least 2+/5 to improve participation in IADL/leisure tasks    Baseline 10/06/20 - RUE and LUE 2-/5    Time 8    Period Weeks    Status On-going             Plan - 10/09/20 1027     Clinical Impression Statement OT continued to facilitate ROM of BUE shoulders, w/ primary focus on AROM of R, dominant shoulder, due to impact on functional use of UEs during ADLs. Pt did demo improved L shoulder flexion in supine, which did not carry over into sitting position/against gravity, likely due to increased scapular support provided in supine position. OT also attempted to facilitate closed-chain AROM w/ unweighted dowel, which was d/c due to discomfort in L shoulder. Pt encouraged to carryover chest press exercises in supine w/ or w/out dowel at home to continue to improve BUE strength/ROM.    OT Occupational Profile and History Detailed Assessment- Review of Records and additional  review of physical, cognitive, psychosocial history related to current functional  performance    Occupational performance deficits (Please refer to evaluation for details): ADL's;IADL's;Work;Leisure;Social Participation    Body Structure / Function / Physical Skills ADL;Decreased knowledge of use of DME;Strength;Pain;GMC;Balance;Body mechanics;UE functional use;ROM;IADL;Endurance;Coordination;Mobility;Gait    Rehab Potential Good    Clinical Decision Making Several treatment options, min-mod task modification necessary    Comorbidities Affecting Occupational Performance: Presence of comorbidities impacting occupational performance    Modification or Assistance to Complete Evaluation  Min-Moderate modification of tasks or assist with assess necessary to complete eval    OT Frequency 2x / week    OT Duration 8 weeks    OT Treatment/Interventions Self-care/ADL training;Moist Heat;DME and/or AE instruction;Splinting;Aquatic Therapy;Therapeutic activities;Therapeutic exercise;Ultrasound;Neuromuscular education;Functional Mobility Training;Passive range of motion;Patient/family education;Manual Therapy;Energy conservation    Plan Scapular/pec strengthening; continue A/AAROM and strengthening for BUEs (gravity-assisted/minimized, supine and/or sidelying)    Consulted and Agree with Plan of Care Patient             Patient will benefit from skilled therapeutic intervention in order to improve the following deficits and impairments:   Body Structure / Function / Physical Skills: ADL, Decreased knowledge of use of DME, Strength, Pain, GMC, Balance, Body mechanics, UE functional use, ROM, IADL, Endurance, Coordination, Mobility, Gait   Visit Diagnosis: Stiffness of right shoulder, not elsewhere classified  Stiffness of left shoulder, not elsewhere classified  Muscle weakness (generalized)  Other symptoms and signs involving the musculoskeletal system    Problem List Patient Active Problem List   Diagnosis Date Noted   Cellulitis of chest wall    Acute renal failure  (HCC) 01/26/2017   Exacerbation of systemic lupus erythematosus (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 [place], [name], [address] Phone: [phone]   Fax:  [phone]  Name: Anne Robinson MRN: [id number] Date of Birth: December 11, 1990

## 2020-10-09 NOTE — Patient Instructions (Signed)
Access Code: [email or web address] URL: [email or web address] Date: 10/09/2020 Prepared by: Grayling Congress  Exercises Supine Bridge with Resistance Band - 2 x daily - 7 x weekly - 2 sets - 10 reps Clamshell with Resistance - 2 x daily - 7 x weekly - 2 sets - 10 reps

## 2020-10-12 ENCOUNTER — Ambulatory Visit: Payer: 59 | Admitting: Physical Therapy

## 2020-10-12 ENCOUNTER — Other Ambulatory Visit: Payer: Self-pay

## 2020-10-12 ENCOUNTER — Ambulatory Visit: Payer: 59 | Admitting: Occupational Therapy

## 2020-10-12 ENCOUNTER — Encounter: Payer: Self-pay | Admitting: Physical Therapy

## 2020-10-12 ENCOUNTER — Encounter: Payer: Self-pay | Admitting: Occupational Therapy

## 2020-10-12 DIAGNOSIS — M25611 Stiffness of right shoulder, not elsewhere classified: Secondary | ICD-10-CM

## 2020-10-12 DIAGNOSIS — R262 Difficulty in walking, not elsewhere classified: Secondary | ICD-10-CM

## 2020-10-12 DIAGNOSIS — R29898 Other symptoms and signs involving the musculoskeletal system: Secondary | ICD-10-CM

## 2020-10-12 DIAGNOSIS — M25612 Stiffness of left shoulder, not elsewhere classified: Secondary | ICD-10-CM

## 2020-10-12 DIAGNOSIS — M6281 Muscle weakness (generalized): Secondary | ICD-10-CM

## 2020-10-12 DIAGNOSIS — R2681 Unsteadiness on feet: Secondary | ICD-10-CM

## 2020-10-12 NOTE — Therapy (Signed)
[place]. [place], [name], [address] Phone: [phone]   Fax:  [phone]  Physical Therapy Treatment  Patient Details  Name: Anne Robinson MRN: [id number] Date of Birth: 23-Mar-1990 Referring Provider (PT): Glendale Chard Date: 10/12/2020   PT End of Session - 10/12/20 1617     Visit Number 8    Number of Visits 22    Date for PT Re-Evaluation 11/30/20    Authorization Type 60 combined visits    PT Start Time 8828    PT Stop Time 0034    PT Time Calculation (min) 39 min    Activity Tolerance Patient tolerated treatment well;Patient limited by fatigue;Patient limited by pain    Behavior During Therapy [facility] for tasks assessed/performed             Past Medical History:  Diagnosis Date   HTN (hypertension) 05/15/2016   Lupus ([place])    Lupus nephritis ([place])    Pseudotumor cerebri    Renal disorder    Thyroid disease     Past Surgical History:  Procedure Laterality Date   INSERTION OF DIALYSIS CATHETER     RENAL BIOPSY     SHOULDER SURGERY      There were no vitals filed for this visit.   Subjective Assessment - 10/12/20 1539     Subjective Has an [place] and mammogram for a bump on her R breast later this week.    Pertinent History ESRD. Lupus, HTN    Patient Stated Goals be stronger, walk, cook, shop    Currently in Pain? No/denies    Pain Score 2     Pain Location Shoulder    Pain Orientation Right;Left    Pain Descriptors / Indicators Aching    Pain Type Chronic pain                               [facility] Adult PT Treatment/Exercise - 10/12/20 0001       Knee/Hip Exercises: Stretches   Piriformis Stretch Right;Left;1 rep;30 seconds    Piriformis Stretch Limitations supine with strap KTOS      Knee/Hip Exercises: Aerobic   Nustep L1 x 6 min (LEs)      Knee/Hip Exercises: Standing   Forward Step Up Right;Left;1 set;10 reps;Hand Hold: 1;Step Height: 6"     Forward Step Up Limitations R/L step up/back slow      Knee/Hip Exercises: Seated   Sit to Sand 10 reps;without UE support;2 sets   red TB above knees     Knee/Hip Exercises: Supine   Bridges Strengthening;Both;1 set;10 reps    Bridges Limitations straight leg bridge LEs on pball   instability   Bridges with Clamshell Both;2 sets;10 reps   red TB above knees     Knee/Hip Exercises: Sidelying   Hip ABduction Strengthening;Right;Left;1 set;10 reps    Hip ABduction Limitations cueing for alignment    Hip ADduction Strengthening;Right;Left;1 set;10 reps    Hip ADduction Limitations opposite LE elevated on bolster                 Balance Exercises - 10/12/20 0001       Balance Exercises: Standing   Tandem Gait Forward;Retro;2 reps   1-2 finger support on II bars                PT Short Term Goals - 10/09/20 1148  PT SHORT TERM GOAL #1   Title independent with initial HEP    Time 3    Status Achieved    Target Date 10/26/20               PT Long Term Goals - 10/05/20 1627       PT LONG TERM GOAL #1   Title walk 300 feet with LRAD    Time 8    Period Weeks    Status On-going   not tested   Target Date 11/30/20      PT LONG TERM GOAL #2   Title decrease TUG time to 20 seconds    Status Achieved      PT LONG TERM GOAL #3   Title increase berg balance score to 48/56    Status Achieved      PT LONG TERM GOAL #4   Title stand to cook a meal without diffiuclty    Time 8    Status Partially Met   able to make coffee   Target Date 11/30/20      PT LONG TERM GOAL #5   Title increase hip strength to 4/5    Time 8    Status On-going    Target Date 11/30/20                   Plan - 10/12/20 1618     Clinical Impression Statement Patient arrived to session without new complaints. Worked on progressive hip strengthening ther-ex with patient demonstrating good amplitude of movement with bridges. Increased challenge with straight leg  bridges, with patient demonstrating hip and core instability and compensations. Good alignment was maintained with sidelying ther-ex despite patient being quick to fatigue with these exercises. Slightly limited quad stability was demonstrated with step ups today. Sitting rest break still needed in between standing ther-ex d/t fatigue and LBP. Patient without complaints at end of session. Progressing well towards goals.    Personal Factors and Comorbidities Comorbidity 3+    Comorbidities ESRD, HTN, Lupus    Rehab Potential Good    PT Frequency 2x / week   1-2x   PT Duration 8 weeks    PT Treatment/Interventions ADLs/Self Care Home Management;Electrical Stimulation;Moist Heat;Gait training;Neuromuscular re-education;Balance training;Therapeutic exercise;Therapeutic activities;Functional mobility training;Stair training;Patient/family education;Manual techniques;Cryotherapy;Ultrasound;Iontophoresis 41m/ml Dexamethasone;Passive range of motion;Dry needling;Energy conservation;Vasopneumatic Device;Taping    PT Next Visit Plan higher level balance, core and hip strengthening    Consulted and Agree with Plan of Care Patient             Patient will benefit from skilled therapeutic intervention in order to improve the following deficits and impairments:  Abnormal gait, Decreased range of motion, Difficulty walking, Decreased activity tolerance, Cardiopulmonary status limiting activity, Pain, Decreased balance, Improper body mechanics, Postural dysfunction, Decreased strength, Decreased mobility  Visit Diagnosis: Stiffness of right shoulder, not elsewhere classified  Stiffness of left shoulder, not elsewhere classified  Muscle weakness (generalized)  Other symptoms and signs involving the musculoskeletal system  Difficulty walking  Unsteady gait     Problem List Patient Active Problem List   Diagnosis Date Noted   Cellulitis of chest wall    [place] 01/26/2017    Ex[place] of [place]matosus ([place] 01/26/2017   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 [place], [name], [address] Phone: [phone]   Fax:  [phone]  Name: SONORA CATLIN MRN: [id number] Date of Birth: 29-Nov-1990

## 2020-10-12 NOTE — Therapy (Signed)
[place]. [place], [name], [address] Phone: [phone]   Fax:  [phone]  Occupational Therapy Treatment  Patient Details  Name: Anne Robinson MRN: [id number] Date of Birth: 1991/03/01 Referring Provider (OT): Leandro Reasoner, MD   Encounter Date: 10/12/2020   OT End of Session - 10/12/20 [id number]     Visit Number 8    Number of Visits 17    Date for OT Re-Evaluation 11/04/20    Authorization Type UHC (primary); Medicare (secondary)    Authorization Time Period VL: 60 combined PT/OT    Authorization - Visit Number 14    Authorization - Number of Visits 60    Progress Note Due on Visit 10    OT Start Time 1615    OT Stop Time 1658    OT Time Calculation (min) 43 min    Activity Tolerance Patient tolerated treatment well    Behavior During Therapy WFL for tasks assessed/performed             Past Medical History:  Diagnosis Date   HTN (hypertension) 05/15/2016   Lupus ([place])    Lupus nephritis ([place])    Pseudotumor cerebri    Renal disorder    Thyroid disease     Past Surgical History:  Procedure Laterality Date   INSERTION OF DIALYSIS CATHETER     RENAL BIOPSY     SHOULDER SURGERY      There were no vitals filed for this visit.   Subjective Assessment - 10/12/20 1615     Subjective  "They (shoulders) feel better now that I'm moving them"    Pertinent History ESRD (peritoneal dialysis; kidney transplant list); intracranial hypertension s/p VP shunt; hx of seizures and PE (currently on Eliquis); ORIF s/p bilateral shoulder fx    Patient Stated Goals "Get stronger;" improve functional use of BUEs    Currently in Pain? Yes    Pain Score 3     Pain Location Back    Pain Orientation Mid;Lower    Pain Descriptors / Indicators Aching    Pain Type Chronic pain    Pain Onset More than a month ago    Pain Frequency Intermittent             Treatment/Exercises - 10/12/20    Pulleys   Completed w/ RUE only due to pain in LUE; 4 min w/ 1 rest break and verbal cues for pacing. Able to achieve shoulder flexion WFL w/out add'l pain in R shoulder    Gravity-Assisted ROM Attempted closed-chain BUE shoulder flexion, reaching down toward floor holding foam roll. Unable to complete due to pain in L shoulder; activity d/c    Manual Therapy: PROM OT-facilitated gentle PROM of L shoulder flex within pain free range; increased pain w/ attempted muscle activation. Able to achieve ~120* of ROM. Soft tissue mobilization massage also incorporated for increased comfort    AAROM: Supine L shoulder abd in supine/gravity-min position w/ therapist support provided under upper arm and forearm; able to achieve ~75* of movement w/out compensatory shoulder elevation    Light Strengthening Single-arm chest press in supine w/ RUE & 1# and LUE & 2# for light strengthening within tolerable level of discomfort; OT provided support under each UE to increase control/improve alignment    Patient Stated Goals Facilitated pt activation of shoulder to maintain position at 90* before returning to starting position; completed 3 sets slowly w/ each UE; able to complete exercises w/ each UE  w/out add'l pain            OT Short Term Goals - 10/09/20 1026       OT SHORT TERM GOAL #1   Title Pt will verbalize understanding of at least 3 energy conservation strategies to implement during BADL/IADLs    Baseline Decreased endurance impacting daily activities    Time 4    Period Weeks    Status Achieved   09/02/20 - verbalized understanding   Target Date 11/02/20      OT SHORT TERM GOAL #2   Title Pt will demonstrate independence with initial HEP designed for BUE ROM    Baseline Initiated HEP for BUE ROM    Time 4    Period Weeks    Status On-going      OT SHORT TERM GOAL #3   Title Pt will improve strength of R, dominant UE shoulder flexion to at least 2/5    Baseline R shoulder flexion 2-/5    Time 4     Period Weeks    Status On-going      OT SHORT TERM GOAL #4   Title Pt will increase AROM of R and L shoulder flexion to greater than 50% of ROM in gravity-minimized position to facilitate functional use of BUEs    Baseline Less than 50% of bilateral shoulder flexion against gravity    Time 4    Period Weeks    Status On-going             OT Long Term Goals - 10/09/20 1026       OT LONG TERM GOAL #1   Title Pt will be independent w/ full HEP designed for BUE ROM and strength    Baseline Initial HEP for ROM    Time 8    Period Weeks    Status On-going      OT LONG TERM GOAL #2   Title Pt will improve grip strength to 35# or greater in both hands to improve participation in IADLs    Baseline R hand 25#; L hand 23#    Time 8    Period Weeks    Status On-going      OT LONG TERM GOAL #3   Title Pt will demonstrate at least 25% of R shoulder flexion against gravity to improve functional use of dominant UE    Baseline No R shoulder flexion against gravity    Time 8    Period Weeks    Status On-going      OT LONG TERM GOAL #4   Title Pt will be able to prepare a simple meal/snack w/ Mod I level, using AE/compensatory strategies prn in at least 1 trial    Baseline Currently not preparing meals    Time 8    Period Weeks    Status On-going      OT LONG TERM GOAL #5   Title Pt will improve strength of BUE shoulder flexion to at least 2+/5 to improve participation in IADL/leisure tasks    Baseline 10/06/20 - RUE and LUE 2-/5    Time 8    Period Weeks    Status On-going             Plan - 10/12/20 1730     Clinical Impression Statement OT continued to facilitate bilateral shoulder ROM in supine position to facilitate scapular alignment w/ retraction and decrease pain and compensatory movement patterns observed when in seated position. Pt demo'd improved AROM  of both shoulders this session, able to tolerate prolonged exercises and complete forward chest press in supine w/  light wt compared w/ prior attempts; pt encouraged to continue to focus on consistency w/ A/AAROM at home before incorporating wt and pt was receptive. Also discussed decreasing frequency to 1x/week soon due to insurance-based VL; pt was agreeable.    OT Occupational Profile and History Detailed Assessment- Review of Records and additional review of physical, cognitive, psychosocial history related to current functional performance    Occupational performance deficits (Please refer to evaluation for details): ADL's;IADL's;Work;Leisure;Social Participation    Body Structure / Function / Physical Skills ADL;Decreased knowledge of use of DME;Strength;Pain;GMC;Balance;Body mechanics;UE functional use;ROM;IADL;Endurance;Coordination;Mobility;Gait    Rehab Potential Good    Clinical Decision Making Several treatment options, min-mod task modification necessary    Comorbidities Affecting Occupational Performance: Presence of comorbidities impacting occupational performance    Modification or Assistance to Complete Evaluation  Min-Moderate modification of tasks or assist with assess necessary to complete eval    OT Frequency 2x / week    OT Duration 8 weeks    OT Treatment/Interventions Self-care/ADL training;Moist Heat;DME and/or AE instruction;Splinting;Aquatic Therapy;Therapeutic activities;Therapeutic exercise;Ultrasound;Neuromuscular education;Functional Mobility Training;Passive range of motion;Patient/family education;Manual Therapy;Energy conservation    Plan Scapular/pec strengthening; bilateral shoulder flexion in sidelying/gravity eliminated position    Consulted and Agree with Plan of Care Patient             Patient will benefit from skilled therapeutic intervention in order to improve the following deficits and impairments:   Body Structure / Function / Physical Skills: ADL, Decreased knowledge of use of DME, Strength, Pain, GMC, Balance, Body mechanics, UE functional use, ROM, IADL,  Endurance, Coordination, Mobility, Gait   Visit Diagnosis: Stiffness of left shoulder, not elsewhere classified  Stiffness of right shoulder, not elsewhere classified  Muscle weakness (generalized)  Other symptoms and signs involving the musculoskeletal system    Problem List Patient Active Problem List   Diagnosis Date Noted   Cellulitis of chest wall    Acute renal failure (HCC) 01/26/2017   Exacerbation of systemic lupus erythematosus ([place]) 01/26/2017   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 [place], [name], [address] Phone: [phone]   Fax:  [phone]  Name: Anne Robinson MRN: [id number] Date of Birth: 02/07/1991

## 2020-10-14 ENCOUNTER — Ambulatory Visit: Payer: 59 | Admitting: Physical Therapy

## 2020-10-14 ENCOUNTER — Encounter: Payer: Self-pay | Admitting: Physical Therapy

## 2020-10-14 ENCOUNTER — Other Ambulatory Visit: Payer: Self-pay

## 2020-10-14 DIAGNOSIS — M25612 Stiffness of left shoulder, not elsewhere classified: Secondary | ICD-10-CM

## 2020-10-14 DIAGNOSIS — M25611 Stiffness of right shoulder, not elsewhere classified: Secondary | ICD-10-CM

## 2020-10-14 DIAGNOSIS — R262 Difficulty in walking, not elsewhere classified: Secondary | ICD-10-CM

## 2020-10-14 DIAGNOSIS — M6281 Muscle weakness (generalized): Secondary | ICD-10-CM

## 2020-10-14 DIAGNOSIS — R29898 Other symptoms and signs involving the musculoskeletal system: Secondary | ICD-10-CM

## 2020-10-14 DIAGNOSIS — R2681 Unsteadiness on feet: Secondary | ICD-10-CM

## 2020-10-14 NOTE — Therapy (Signed)
[facility] [place], [name], [address] Phone: [phone]   Fax:  [phone]  Physical Therapy Treatment  Patient Details  Name: Anne Robinson MRN: [id number] Date of Birth: Jan 16, 1991 Referring Provider (PT): Glendale Chard Date: 10/14/2020   PT End of Session - 10/14/20 1615     Visit Number 9    Number of Visits 22    Date for PT Re-Evaluation 11/30/20    Authorization Type 60 combined visits    PT Start Time 1536    PT Stop Time 1623   moist heat   PT Time Calculation (min) 47 min    Equipment Utilized During Treatment Gait belt    Activity Tolerance Patient tolerated treatment well;Patient limited by fatigue    Behavior During Therapy [facility] for tasks assessed/performed             Past Medical History:  Diagnosis Date   HTN (hypertension) 05/15/2016   Lupus ([place])    Lupus nephritis ([place])    Pseudotumor cerebri    Renal disorder    Thyroid disease     Past Surgical History:  Procedure Laterality Date   INSERTION OF DIALYSIS CATHETER     RENAL BIOPSY     SHOULDER SURGERY      There were no vitals filed for this visit.   Subjective Assessment - 10/14/20 1537     Subjective Apologizes for being late- had another appointment. Not much is new.    Pertinent History ESRD. Lupus, HTN    Patient Stated Goals be stronger, walk, cook, shop    Currently in Pain? Yes    Pain Score 2     Pain Location Back    Pain Orientation Lower    Pain Descriptors / Indicators Aching    Pain Type Chronic pain    Pain Score 2    Pain Location Shoulder    Pain Orientation Right;Left    Pain Descriptors / Indicators Aching    Pain Type Chronic pain                               [facility] Adult PT Treatment/Exercise - 10/14/20 0001       Lumbar Exercises: Supine   Pelvic Tilt 10 reps    Pelvic Tilt Limitations good ROM    Other Supine Lumbar Exercises LTR 20x      Knee/Hip  Exercises: Aerobic   Nustep L5 x 6 min (LEs)   rest break at 5 min d/t fatigue     Knee/Hip Exercises: Supine   Bridges with Ball Squeeze Strengthening;1 set;10 reps      Moist Heat Therapy   Number Minutes Moist Heat 10 Minutes   ended early d/t feeling hot   Moist Heat Location Lumbar Spine;Shoulder   R     Manual Therapy   Manual Therapy Passive ROM    Passive ROM R/L KTOS and fig 4 30" each                 Balance Exercises - 10/14/20 0001       Balance Exercises: Standing   Other Standing Exercises Comments R/L step on foam + reach across midline to ski pole 10x with CGA; romberg on foam + torso twist with airplane arms 10x; 4 square step test to colored cones x5 min; R/L forwrd/back stepping 10x each with CGA  PT Short Term Goals - 10/09/20 1148       PT SHORT TERM GOAL #1   Title independent with initial HEP    Time 3    Status Achieved    Target Date 10/26/20               PT Long Term Goals - 10/05/20 1627       PT LONG TERM GOAL #1   Title walk 300 feet with LRAD    Time 8    Period Weeks    Status On-going   not tested   Target Date 11/30/20      PT LONG TERM GOAL #2   Title decrease TUG time to 20 seconds    Status Achieved      PT LONG TERM GOAL #3   Title increase berg balance score to 48/56    Status Achieved      PT LONG TERM GOAL #4   Title stand to cook a meal without diffiuclty    Time 8    Status Partially Met   able to make coffee   Target Date 11/30/20      PT LONG TERM GOAL #5   Title increase hip strength to 4/5    Time 8    Status On-going    Target Date 11/30/20                   Plan - 10/14/20 1616     Clinical Impression Statement Patient arrived to session without new complaints. Continued working on dynamic balance challenges on compliant surface. Patient demonstrated most challenge with reaching activities d/t shoulder discomfort/stiffness. Able to demonstrate good ability  to cross midline and perform multidirectional stepping quickly with today's tasks. Patient required intermittent sitting rest breaks d/t LBP and fatigue, however patient is demonstrating good improvement in exercise tolerance and stability. End of session focused on gentle lumbopelvic ROM and stretching, which was tolerated well. Ended session with moist heat to LB and R shoulder. No complaints at end of session.    Personal Factors and Comorbidities Comorbidity 3+    Comorbidities ESRD, HTN, Lupus    Rehab Potential Good    PT Frequency 2x / week   1-2x   PT Duration 8 weeks    PT Treatment/Interventions ADLs/Self Care Home Management;Electrical Stimulation;Moist Heat;Gait training;Neuromuscular re-education;Balance training;Therapeutic exercise;Therapeutic activities;Functional mobility training;Stair training;Patient/family education;Manual techniques;Cryotherapy;Ultrasound;Iontophoresis 4mg /ml Dexamethasone;Passive range of motion;Dry needling;Energy conservation;Vasopneumatic Device;Taping    PT Next Visit Plan higher level balance, core and hip strengthening    Consulted and Agree with Plan of Care Patient             Patient will benefit from skilled therapeutic intervention in order to improve the following deficits and impairments:  Abnormal gait, Decreased range of motion, Difficulty walking, Decreased activity tolerance, Cardiopulmonary status limiting activity, Pain, Decreased balance, Improper body mechanics, Postural dysfunction, Decreased strength, Decreased mobility  Visit Diagnosis: Stiffness of right shoulder, not elsewhere classified  Stiffness of left shoulder, not elsewhere classified  Muscle weakness (generalized)  Other symptoms and signs involving the musculoskeletal system  Difficulty walking  Unsteady gait     Problem List Patient Active Problem List   Diagnosis Date Noted   Cellulitis of chest wall    Acute renal failure ([place]) 01/26/2017    Exacerbation of systemic lupus erythematosus ([place]) 01/26/2017   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 [place], [name], [address] Phone: [phone]   Fax:  [phone]  Name: Anne Robinson MRN: [id number] Date of Birth: 14-Aug-1990

## 2020-10-23 ENCOUNTER — Ambulatory Visit: Payer: 59 | Admitting: Occupational Therapy

## 2020-10-23 ENCOUNTER — Ambulatory Visit: Payer: 59 | Admitting: Physical Therapy

## 2020-11-23 ENCOUNTER — Emergency Department (HOSPITAL_BASED_OUTPATIENT_CLINIC_OR_DEPARTMENT_OTHER)
Admission: EM | Admit: 2020-11-23 | Discharge: 2020-11-24 | Disposition: A | Discharge status: Home / Self Care | Payer: 59 | Attending: Emergency Medicine | Admitting: Emergency Medicine

## 2020-11-23 ENCOUNTER — Other Ambulatory Visit: Payer: Self-pay

## 2020-11-23 ENCOUNTER — Encounter (HOSPITAL_BASED_OUTPATIENT_CLINIC_OR_DEPARTMENT_OTHER): Payer: Self-pay | Admitting: Emergency Medicine

## 2020-11-23 ENCOUNTER — Emergency Department (HOSPITAL_BASED_OUTPATIENT_CLINIC_OR_DEPARTMENT_OTHER): Payer: 59

## 2020-11-23 DIAGNOSIS — Z79899 Other long term (current) drug therapy: Secondary | ICD-10-CM | POA: Insufficient documentation

## 2020-11-23 DIAGNOSIS — I120 Hypertensive chronic kidney disease with stage 5 chronic kidney disease or end stage renal disease: Secondary | ICD-10-CM | POA: Insufficient documentation

## 2020-11-23 DIAGNOSIS — E876 Hypokalemia: Secondary | ICD-10-CM | POA: Insufficient documentation

## 2020-11-23 DIAGNOSIS — I10 Essential (primary) hypertension: Secondary | ICD-10-CM | POA: Insufficient documentation

## 2020-11-23 DIAGNOSIS — N186 End stage renal disease: Secondary | ICD-10-CM | POA: Insufficient documentation

## 2020-11-23 DIAGNOSIS — E875 Hyperkalemia: Secondary | ICD-10-CM

## 2020-11-23 DIAGNOSIS — R5383 Other fatigue: Secondary | ICD-10-CM | POA: Diagnosis present

## 2020-11-23 DIAGNOSIS — Z992 Dependence on renal dialysis: Secondary | ICD-10-CM | POA: Insufficient documentation

## 2020-11-23 DIAGNOSIS — Z20822 Contact with and (suspected) exposure to covid-19: Secondary | ICD-10-CM | POA: Insufficient documentation

## 2020-11-23 NOTE — ED Triage Notes (Signed)
Pt c/o pain in extremities and fatigue. Pt states symptoms consistent with previous electrolyte imbalances. Pt last dialysis sat and is due for next tomorrow.

## 2020-11-23 NOTE — ED Notes (Signed)
IV start unsuccessful - will have respiratory technician attempt US guided IV

## 2020-11-24 ENCOUNTER — Encounter (HOSPITAL_BASED_OUTPATIENT_CLINIC_OR_DEPARTMENT_OTHER): Payer: Self-pay | Admitting: Emergency Medicine

## 2020-11-24 ENCOUNTER — Emergency Department (HOSPITAL_BASED_OUTPATIENT_CLINIC_OR_DEPARTMENT_OTHER): Payer: 59

## 2020-11-24 LAB — CBC WITH DIFFERENTIAL/PLATELET
Abs Immature Granulocytes: 0.07 10*3/uL (ref 0.00–0.07)
Basophils Absolute: 0 10*3/uL (ref 0.0–0.1)
Basophils Relative: 0 %
Eosinophils Absolute: 0.2 10*3/uL (ref 0.0–0.5)
Eosinophils Relative: 3 %
HCT: 32.5 % — ABNORMAL LOW (ref 36.0–46.0)
Hemoglobin: 10.2 g/dL — ABNORMAL LOW (ref 12.0–15.0)
Immature Granulocytes: 1 %
Lymphocytes Relative: 16 %
Lymphs Abs: 1 10*3/uL (ref 0.7–4.0)
MCH: 30.6 pg (ref 26.0–34.0)
MCHC: 31.4 g/dL (ref 30.0–36.0)
MCV: 97.6 fL (ref 80.0–100.0)
Monocytes Absolute: 0.5 10*3/uL (ref 0.1–1.0)
Monocytes Relative: 8 %
Neutro Abs: 4.2 10*3/uL (ref 1.7–7.7)
Neutrophils Relative %: 72 %
Platelets: 136 10*3/uL — ABNORMAL LOW (ref 150–400)
RBC: 3.33 MIL/uL — ABNORMAL LOW (ref 3.87–5.11)
RDW: 20 % — ABNORMAL HIGH (ref 11.5–15.5)
WBC: 5.9 10*3/uL (ref 4.0–10.5)
nRBC: 0 % (ref 0.0–0.2)

## 2020-11-24 LAB — RESP PANEL BY RT-PCR (FLU A&B, COVID) ARPGX2
Influenza A by PCR: NEGATIVE
Influenza B by PCR: NEGATIVE
SARS Coronavirus 2 by RT PCR: NEGATIVE

## 2020-11-24 LAB — BASIC METABOLIC PANEL
Anion gap: 10 (ref 5–15)
BUN: 46 mg/dL — ABNORMAL HIGH (ref 6–20)
CO2: 24 mmol/L (ref 22–32)
Calcium: 8.6 mg/dL — ABNORMAL LOW (ref 8.9–10.3)
Chloride: 105 mmol/L (ref 98–111)
Creatinine, Ser: 10.42 mg/dL — ABNORMAL HIGH (ref 0.44–1.00)
GFR, Estimated: 5 mL/min — ABNORMAL LOW (ref >60)
Glucose, Bld: 99 mg/dL (ref 70–99)
Potassium: 6.3 mmol/L (ref 3.5–5.1)
Sodium: 139 mmol/L (ref 135–145)

## 2020-11-24 LAB — MAGNESIUM: Magnesium: 1.4 mg/dL — ABNORMAL LOW (ref 1.7–2.4)

## 2020-11-24 LAB — HCG, SERUM, QUALITATIVE: Preg, Serum: NEGATIVE

## 2020-11-24 MED ORDER — INSULIN ASPART 100 UNIT/ML IV SOLN: 5.0000 [IU] | Freq: Once | INTRAVENOUS | Status: DC

## 2020-11-24 MED ORDER — ALBUTEROL SULFATE (2.5 MG/3ML) 0.083% IN NEBU
10.0000 mg | INHALATION_SOLUTION | Freq: Once | RESPIRATORY_TRACT | Status: AC
  Filled 2020-11-24: qty 12

## 2020-11-24 MED ORDER — SODIUM ZIRCONIUM CYCLOSILICATE 10 G PO PACK
10.0000 g | PACK | ORAL | Status: AC
  Administered 2020-11-24: 10 g via ORAL
  Filled 2020-11-24: qty 1

## 2020-11-24 MED ORDER — SODIUM CHLORIDE 0.9 % IV SOLN: 1.0000 g | Freq: Once | INTRAVENOUS | Status: DC

## 2020-11-24 MED ORDER — CALCIUM GLUCONATE-NACL 1-0.675 GM/50ML-% IV SOLN
1.0000 g | Freq: Once | INTRAVENOUS | Status: AC
  Administered 2020-11-24: 1000 mg via INTRAVENOUS
  Filled 2020-11-24: qty 50

## 2020-11-24 MED ORDER — DEXTROSE 50 % IV SOLN: 1.0000 | Freq: Once | INTRAVENOUS | Status: DC

## 2020-11-24 MED ORDER — ALBUTEROL SULFATE (2.5 MG/3ML) 0.083% IN NEBU
INHALATION_SOLUTION | RESPIRATORY_TRACT | Status: AC
  Administered 2020-11-24: 10 mg via RESPIRATORY_TRACT
  Filled 2020-11-24: qty 12

## 2020-11-24 MED ORDER — FUROSEMIDE 10 MG/ML IJ SOLN
40.0000 mg | Freq: Once | INTRAMUSCULAR | Status: DC
  Filled 2020-11-24: qty 4

## 2020-11-24 NOTE — ED Notes (Signed)
The patient is refusing to be transferred to [facility] for Dialysis - Dr. Randal Buba informed. This RN explained to the patient that if she left she would be leaving AMA. I explained the risks of leaving AMA to her - worsening condition and possible death. Pt verbalized understanding and stated that her plan is to go to her already scheduled dialysis appt later this morning at 0630. Pt states she will stay here until the IV Calcium has finished. Dr. Randal Buba aware.

## 2020-11-24 NOTE — ED Notes (Signed)
IV infiltated - Dr. Randal Buba informed. RT to attempt another [place] IV

## 2020-11-24 NOTE — ED Notes (Signed)
Patient transported to XRAY 

## 2020-11-24 NOTE — ED Provider Notes (Addendum)
[facility] EMERGENCY DEPARTMENT Provider Note   CSN: [id number] Arrival date & time: 11/23/20  1938     History Chief Complaint  Patient presents with   Fatigue    Anne Robinson is a 30 y.o. female.  The history is provided by the patient.  Illness Location:  Body Quality:  Generalized fatigue and not feeling well Severity:  Moderate Onset quality:  Gradual Duration:  3 days Timing:  Constant Progression:  Unchanged Chronicity:  Recurrent Context:  ESRD TTS Relieved by:  Nothing Worsened by:  Nothing Ineffective treatments:  None Associated symptoms: fatigue   Associated symptoms: no abdominal pain, no chest pain, no congestion, no cough, no diarrhea, no ear pain, no fever, no loss of consciousness, no nausea, no rash, no rhinorrhea, no shortness of breath, no sore throat, no vomiting and no wheezing   Risk factors:  ESRD     Past Medical History:  Diagnosis Date   HTN (hypertension) 05/15/2016   Lupus ([place])    Lupus nephritis ([place])    Pseudotumor cerebri    Renal disorder    Thyroid disease     Patient Active Problem List   Diagnosis Date Noted   Cellulitis of chest wall    Acute renal failure ([place]) 01/26/2017   Exacerbation of systemic lupus erythematosus ([place]) 01/26/2017   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     History reviewed. No pertinent family history.  Social History   Tobacco Use   Smoking status: Never   Smokeless tobacco: Never  Vaping Use   Vaping  Use: Never used  Substance Use Topics   Alcohol use: No   Drug use: No    Home Medications Prior to Admission medications   Medication Sig Start Date End Date Taking? Authorizing Provider  amLODipine (NORVASC) 10 MG tablet Take by mouth. 05/10/19   [provider]  calcitRIOL (ROCALTROL) 0.25 MCG capsule  06/09/19   [provider]  calcium carbonate (OS-CAL) 1250 (500 Ca) MG chewable tablet Chew by mouth.    [provider]  diazepam (VALIUM) 5 MG tablet Take 1 tablet at bedtime as needed for sleep. 12/07/19   Molpus, John, MD  EPINEPHrine 0.3 mg/0.3 mL IJ SOAJ injection Inject into the muscle. 07/12/13   [provider]  famotidine (PEPCID) 20 MG tablet Take by mouth. 01/29/18 09/14/19  [provider]  fluconazole (DIFLUCAN) 150 MG tablet Take 150 mg daily as needed by mouth (yeast infection).  01/11/17   [provider]  fluticasone (FLONASE) 50 MCG/ACT nasal spray Place into the nose.    [provider]  furosemide (LASIX) 20 MG tablet Take by mouth. 10/08/18   [provider]  HYDROcodone-acetaminophen (NORCO/VICODIN) 5-325 MG tablet Take by mouth. 01/27/19   [provider]  labetalol (NORMODYNE) 300 MG tablet Take 300 mg by mouth 3 (three) times daily. 06/07/19   [provider]  labetalol (NORMODYNE) 300 MG tablet Take  by mouth. 05/01/18   [provider]  loratadine (CLARITIN) 10 MG tablet Take 10 mg by mouth daily as needed for allergies.     [provider]  mycophenolate (CELLCEPT) 500 MG tablet  02/12/18   [provider]  nebivolol (BYSTOLIC) 10 MG tablet  [id number]   [provider]  pantoprazole (PROTONIX) 40 MG tablet Take by mouth. 08/29/19 08/28/20  [provider]  predniSONE (DELTASONE) 10 MG tablet Take by mouth. 05/20/19   [provider]  predniSONE (DELTASONE) 20 MG tablet Take 2 tablets (40 mg total) by mouth daily with breakfast. 01/29/17    Patrecia Pour, MD  temazepam (RESTORIL) 15 MG capsule Take by mouth. 01/21/19 12/07/19  [provider]    Allergies    Metoclopramide and Lisinopril  Review of Systems   Review of Systems  Constitutional:  Positive for fatigue. Negative for fever.  HENT:  Negative for congestion, ear pain, rhinorrhea and sore throat.   Eyes:  Negative for redness.  Respiratory:  Negative for cough, shortness of breath and wheezing.   Cardiovascular:  Negative for chest pain.  Gastrointestinal:  Negative for abdominal pain, diarrhea, nausea and vomiting.  Genitourinary:  Negative for difficulty urinating.  Skin:  Negative for rash.  Neurological:  Negative for loss of consciousness and facial asymmetry.  Psychiatric/Behavioral:  Negative for agitation.   All other systems reviewed and are negative.  Physical Exam Updated Vital Signs BP (!) 144/86   Pulse 86   Temp 98.3 F (36.8 C) (Oral)   Resp 17   Ht '4\' 11"'$  (1.499 m)   Wt 75.8 kg   SpO2 100%   BMI 33.73 kg/m   Physical Exam Vitals and nursing note reviewed.  Constitutional:      General: She is not in acute distress.    Appearance: Normal appearance.  HENT:     Head: Normocephalic and atraumatic.     Nose: Nose normal.  Eyes:     Conjunctiva/sclera: Conjunctivae normal.     Pupils: Pupils are equal, round, and reactive to light.  Cardiovascular:     Rate and Rhythm: Normal rate and regular rhythm.     Pulses: Normal pulses.     Heart sounds: Normal heart sounds.  Pulmonary:     Breath sounds: No rales.  Abdominal:     General: Abdomen is flat. Bowel sounds are normal.     Palpations: Abdomen is soft.     Tenderness: There is no abdominal tenderness. There is no guarding.  Musculoskeletal:        General: Normal range of motion.     Cervical back: Normal range of motion and neck supple.  Skin:    General: Skin is warm and dry.     Capillary Refill: Capillary refill takes less than 2 seconds.  Neurological:      Mental Status: She is alert.     Deep Tendon Reflexes: Reflexes normal.  Psychiatric:        Mood and Affect: Mood normal.        Behavior: Behavior normal.    ED Results / Procedures / Treatments   Labs (all labs ordered are listed, but only abnormal results are displayed) Labs Reviewed  BASIC METABOLIC PANEL - Abnormal; Notable for the following components:      Result Value   Potassium 6.3 (*)    BUN 46 (*)    Creatinine, Ser 10.42 (*)    Calcium 8.6 (*)    GFR,  Estimated 5 (*)    All other components within normal limits  CBC WITH DIFFERENTIAL/PLATELET - Abnormal; Notable for the following components:   RBC 3.33 (*)    Hemoglobin 10.2 (*)    HCT 32.5 (*)    RDW 20.0 (*)    Platelets 136 (*)    All other components within normal limits  MAGNESIUM - Abnormal; Notable for the following components:   Magnesium 1.4 (*)    All other components within normal limits  RESP PANEL BY RT-PCR (FLU A&B, COVID) ARPGX2  HCG, SERUM, QUALITATIVE    EKG None  Radiology No results found.  Procedures Procedures   Medications Ordered in ED Medications  furosemide (LASIX) injection 40 mg (has no administration in time range)  sodium zirconium cyclosilicate (LOKELMA) packet 10 g (has no administration in time range)  albuterol (PROVENTIL) (2.5 MG/3ML) 0.083% nebulizer solution 10 mg (has no administration in time range)  calcium gluconate 1 g in sodium chloride 0.9 % 100 mL IVPB (has no administration in time range)    ED Course  I have reviewed the triage vital signs and the nursing notes.  Pertinent labs & imaging results that were available during my care of the patient were reviewed by me and considered in my medical decision making (see chart for details).  Case d/w Dr. Royce Macadamia.  Do not give insulin or dextrose please.  Give calcium and transfer ED to Ed and call Dr. Royce Macadamia upon arrival.  Anne Robinson was evaluated in Emergency Department on 11/24/2020 for the symptoms  described in the history of present illness. She was evaluated in the context of the global COVID-19 pandemic, which necessitated consideration that the patient might be at risk for infection with the SARS-CoV-2 virus that causes COVID-19. Institutional protocols and algorithms that pertain to the evaluation of patients at risk for COVID-19 are in a state of rapid change based on information released by regulatory bodies including the CDC and federal and state organizations. These policies and algorithms were followed during the patient's care in the ED.  Final Clinical Impression(s) / ED Diagnoses Final diagnoses:  Hyperkalemia   Will transter ED to ED for dialysis.    Patient is refusing care and transfer now at this time.  She is AO4 and has capacity to refuse care.  The risks are but are not limited to: death, CHF and ongoing morbidity from lack of care.  She is welcome to return at any time.       Inza Mikrut, MD 11/24/20 [id number]

## 2020-11-24 NOTE — ED Notes (Signed)
Dr. Randal Buba informed the patient refused the IV Lasix

## 2021-04-21 ENCOUNTER — Ambulatory Visit: Payer: Medicare Other | Admitting: Physical Therapy

## 2021-05-05 ENCOUNTER — Ambulatory Visit: Payer: 59 | Admitting: Physical Therapy

## 2021-06-23 ENCOUNTER — Ambulatory Visit: Payer: Medicare Other | Attending: Orthopedic Surgery | Admitting: Physical Therapy

## 2021-06-23 ENCOUNTER — Encounter: Payer: Self-pay | Admitting: Physical Therapy

## 2021-06-23 DIAGNOSIS — M25611 Stiffness of right shoulder, not elsewhere classified: Secondary | ICD-10-CM | POA: Insufficient documentation

## 2021-06-23 DIAGNOSIS — M6281 Muscle weakness (generalized): Secondary | ICD-10-CM | POA: Insufficient documentation

## 2021-06-23 DIAGNOSIS — R29898 Other symptoms and signs involving the musculoskeletal system: Secondary | ICD-10-CM | POA: Diagnosis present

## 2021-06-23 DIAGNOSIS — M25612 Stiffness of left shoulder, not elsewhere classified: Secondary | ICD-10-CM | POA: Diagnosis present
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
## 2021-06-28 ENCOUNTER — Ambulatory Visit: Payer: Medicare Other | Admitting: Physical Therapy

## 2021-07-07 ENCOUNTER — Encounter: Payer: Self-pay | Admitting: Physical Therapy

## 2021-07-07 ENCOUNTER — Ambulatory Visit: Payer: Medicare Other | Attending: Orthopedic Surgery | Admitting: Physical Therapy

## 2021-07-07 DIAGNOSIS — M25612 Stiffness of left shoulder, not elsewhere classified: Secondary | ICD-10-CM | POA: Insufficient documentation

## 2021-07-07 DIAGNOSIS — M25611 Stiffness of right shoulder, not elsewhere classified: Secondary | ICD-10-CM | POA: Diagnosis present

## 2021-07-07 DIAGNOSIS — R29898 Other symptoms and signs involving the musculoskeletal system: Secondary | ICD-10-CM | POA: Diagnosis present

## 2021-07-07 DIAGNOSIS — M6281 Muscle weakness (generalized): Secondary | ICD-10-CM | POA: Insufficient documentation

## 2021-07-07 NOTE — Therapy (Signed)
[facility] ?[address] ?[place], [name], [address] ?Phone: [phone]   Fax:  [phone] ? ?Physical Therapy Treatment ? ?Patient Details  ?Name: Anne Robinson ?MRN: [id number] ?Date of Birth: [birth year] ?Referring Provider (PT): Christian Pean ? ? ?Encounter Date: 07/07/2021 ? ? PT End of Session - 07/07/21 1053   ? ? Visit Number 2   ? Date for PT Re-Evaluation 09/01/21   ? Authorization Type MCR and UHC   ? PT Start Time 1015   ? PT Stop Time 1055   ? PT Time Calculation (min) 40 min   ? Activity Tolerance Patient tolerated treatment well   ? Behavior During Therapy [facility] for tasks assessed/performed   ? ?  ?  ? ?  ? ? ?Past Medical History:  ?Diagnosis Date  ? HTN (hypertension) 05/15/2016  ? Lupus ([place])   ? Lupus nephritis ([place])   ? Pseudotumor cerebri   ? Renal disorder   ? Thyroid disease   ? ? ?Past Surgical History:  ?Procedure Laterality Date  ? INSERTION OF DIALYSIS CATHETER    ? RENAL BIOPSY    ? SHOULDER SURGERY    ? ? ?There were no vitals filed for this visit. ? ? Subjective Assessment - 07/07/21 1012   ? ? Subjective Today L is worst than the R   ? Pertinent History ESRD. Lupus, HTN   ? Currently in Pain? Yes   ? Pain Score 3    ? Pain Location Shoulder   ? Pain Orientation Left;Right   ? ?  ?  ? ?  ? ? ? ? ? ? ? ? ? ? ? ? ? ? ? ? ? ? ? ? [place] Adult PT Treatment/Exercise - 07/07/21 0001   ? ?  ? Exercises  ? Exercises Shoulder   ?  ? Shoulder Exercises: Supine  ? Flexion AROM;Strengthening;Both;5 reps   RUE eccentrics  ?  ? Shoulder Exercises: Standing  ? Extension Strengthening;Both;20 reps;Theraband   ? Theraband Level (Shoulder Extension) Level 2 (Red)   ? Row Strengthening;Both;20 reps;Theraband   ? Theraband Level (Shoulder Row) Level 2 (Red)   ? Other Standing Exercises AAROM flex, Ext, IR 2x10   ? Other Standing Exercises hammer curls 1lb 2x10   ?  ? Shoulder Exercises: ROM/Strengthening  ? UBE (Upper Arm Bike) L1 x3 min   ?  ? Manual  Therapy  ? Manual Therapy Passive ROM   ? Manual therapy comments Soft end feel   ? Passive ROM Bilateral shoulders in all directions.   ? ?  ?  ? ?  ? ? ? ? ? ? ? ? ? ? ? ? PT Short Term Goals - 06/23/21 1418   ? ?  ? PT SHORT TERM GOAL #1  ? Title Will be compliant with approrpriate progressive HEP   ? Time 5   ? Period Weeks   ? Status New   ? Target Date 07/28/21   ?  ? PT SHORT TERM GOAL #2  ? Title Pain to be no more than 4/10 in B shoulders   ? Time 5   ? Period Weeks   ? Status New   ?  ? PT SHORT TERM GOAL #3  ? Title Will have better understanding of postural mechanics during functional tasks   ? Time 5   ? Period Weeks   ? Status New   ?  ? PT SHORT TERM GOAL #4  ?  Title R shoulder flexion and abduction AROM to have improved by at least 20 degrees and ER to at least 60 degrees   ? Time 5   ? Period Weeks   ? Status New   ? ?  ?  ? ?  ? ? ? ? PT Long Term Goals - 06/23/21 1423   ? ?  ? PT LONG TERM GOAL #1  ? Title MMT to have improved by at least 1 grade in all weak groups   ? Time 10   ? Period Weeks   ? Status New   ? Target Date 09/01/21   ?  ? PT LONG TERM GOAL #2  ? Title B shoulder flexion AROM to be at least 100 degrees in sitting, ABD AROM to be at least 90 degrees in sitting   ? Time 10   ? Period Weeks   ? Status New   ?  ? PT LONG TERM GOAL #3  ? Title Will be able to wash hair and perform functional hygiene without increased pain and minimal compensation patterns   ? Time 10   ? Period Weeks   ? Status New   ?  ? PT LONG TERM GOAL #4  ? Title Will be about to use BUEs to cook a meal without increased pain or difficulty   ? Time 10   ? Period Weeks   ? Status New   ? ?  ?  ? ?  ? ? ? ? ? ? ? ? Plan - 07/07/21 1053   ? ? Clinical Impression Statement Pt enters feeling fine. She has very little active shoulder ROM. Some postural compensation noted with UBE warm up. She was very limited with flexion AAROM using dowel rod. Bilateral UE weakness with rows and extension. Soft end feel with PROM pain  was the only limiting factor. She was unable to complete supine flexion with RUE due to weakness.   ? Personal Factors and Comorbidities Comorbidity 3+   ? Comorbidities ESRD, HTN, Lupus   ? Examination-Activity Limitations Bathing;Reach Overhead;Caring for Others;Sleep;Carry;Hygiene/Grooming;Lift   ? Examination-Participation Restrictions Cleaning;Community Activity;Driving   ? Stability/Clinical Decision Making Evolving/Moderate complexity   ? Rehab Potential Good   ? PT Frequency 2x / week   ? PT Treatment/Interventions ADLs/Self Care Home Management;Electrical Stimulation;Moist Heat;Therapeutic exercise;Therapeutic activities;Functional mobility training;Cryotherapy;Ultrasound;Iontophoresis 4mg /ml Dexamethasone;Patient/family education;Manual techniques;Passive range of motion;Dry needling;Energy conservation;Taping;Vasopneumatic Device   ? PT Next Visit Plan focus on shoulder ROM ad strength, postural training, update HEP   ? ?  ?  ? ?  ? ? ?Patient will benefit from skilled therapeutic intervention in order to improve the following deficits and impairments:  Decreased range of motion, Increased fascial restricitons, Increased muscle spasms, Impaired UE functional use, Decreased activity tolerance, Pain, Hypomobility, Impaired flexibility, Improper body mechanics, Decreased mobility, Decreased strength, Postural dysfunction ? ?Visit Diagnosis: ?Stiffness of right shoulder, not elsewhere classified ? ?Muscle weakness (generalized) ? ?Stiffness of left shoulder, not elsewhere classified ? ? ? ? ?Problem List ?Patient Active Problem List  ? Diagnosis Date Noted  ? Cellulitis of chest wall   ? Acute renal failure ([place]) 01/26/2017  ? Exacerbation of systemic lupus erythematosus ([place]) 01/26/2017  ? 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 Tachypnea 01/24/2017  ? Dyspnea 01/24/2017  ? Macular rash 01/24/2017  ? Abscess of left axilla 01/24/2017  ? Abscess of axilla, left 01/23/2017  ? Hypotension   ? Sepsis ([place]) 05/15/2016   ? Elevated troponin 05/15/2016  ? HTN (hypertension) 05/15/2016  ?  SLE (systemic lupus erythematosus) ([place]) 05/15/2016  ? ? ?Scot Jun, PTA ?07/07/2021, 10:56 AM ? ?[facility] ?[address] ?[place], [name], [address] ?Phone: [phone]   Fax:  [phone] ? ?Name: Anne Robinson ?MRN: [id number] ?Date of Birth: Apr 10, 1990 ? ? ? ?

## 2021-07-09 ENCOUNTER — Ambulatory Visit: Payer: Medicare Other | Admitting: Physical Therapy

## 2021-07-09 ENCOUNTER — Encounter: Payer: Self-pay | Admitting: Physical Therapy

## 2021-07-09 DIAGNOSIS — M6281 Muscle weakness (generalized): Secondary | ICD-10-CM

## 2021-07-09 DIAGNOSIS — M25612 Stiffness of left shoulder, not elsewhere classified: Secondary | ICD-10-CM

## 2021-07-09 DIAGNOSIS — M25611 Stiffness of right shoulder, not elsewhere classified: Secondary | ICD-10-CM | POA: Diagnosis not present

## 2021-07-09 NOTE — Therapy (Signed)
[facility] [place] ?[place] [address] ?[place], [name], [address] ?Phone: [phone]   Fax:  [phone] ? ?Physical Therapy Treatment ? ?Patient Details  ?Name: Anne Robinson ?MRN: [id number] ?Date of Birth: 06/24/1990 ?Referring Provider (PT): Christian Pean ? ? ?Encounter Date: 07/09/2021 ? ? PT End of Session - 07/09/21 1053   ? ? Visit Number 3   ? Date for PT Re-Evaluation 09/01/21   ? Authorization Type MCR and UHC   ? PT Start Time 1015   ? PT Stop Time 1057   ? PT Time Calculation (min) 42 min   ? Activity Tolerance Patient tolerated treatment well   ? Behavior During Therapy [facility] for tasks assessed/performed   ? ?  ?  ? ?  ? ? ?Past Medical History:  ?Diagnosis Date  ? HTN (hypertension) 05/15/2016  ? Lupus ([place])   ? Lupus nephritis ([place])   ? Pseudotumor cerebri   ? Renal disorder   ? Thyroid disease   ? ? ?Past Surgical History:  ?Procedure Laterality Date  ? INSERTION OF DIALYSIS CATHETER    ? RENAL BIOPSY    ? SHOULDER SURGERY    ? ? ?There were no vitals filed for this visit. ? ? Subjective Assessment - 07/09/21 1018   ? ? Subjective Better than she was the other day, not as sore and tight   ? Currently in Pain? Yes   ? Pain Score 2    ? Pain Location Shoulder   ? Pain Orientation Left;Right   ? ?  ?  ? ?  ? ? ? ? ? ? ? ? ? ? ? ? ? ? ? ? ? ? ? ? [facility] Adult PT Treatment/Exercise - 07/09/21 0001   ? ?  ? Shoulder Exercises: Supine  ? Flexion Strengthening;Both;10 reps   LUE 1lb, RUE AAROM  ? Other Supine Exercises ER/IR both UE's x15 each   ?  ? Shoulder Exercises: Standing  ? Extension Strengthening;Both;Theraband;15 reps   x2  ? Theraband Level (Shoulder Extension) Level 2 (Red)   ? Row Strengthening;Both;Theraband;15 reps   x2  ? Theraband Level (Shoulder Row) Level 2 (Red)   ? Other Standing Exercises AAROM flex, Ext, IR 2x10   ?  ? Shoulder Exercises: ROM/Strengthening  ? Nustep L3 x 6 min   ?  ? Manual Therapy  ? Manual Therapy Passive ROM   ? Manual therapy  comments Soft end feel   ? Passive ROM Bilateral shoulders in all directions.   ? ?  ?  ? ?  ? ? ? ? ? ? ? ? ? ? ? ? PT Short Term Goals - 07/09/21 1058   ? ?  ? PT SHORT TERM GOAL #1  ? Title Will be compliant with approrpriate progressive HEP   ? Status On-going   ?  ? PT SHORT TERM GOAL #2  ? Title Pain to be no more than 4/10 in B shoulders   ? Status Partially Met   ?  ? PT SHORT TERM GOAL #3  ? Title Will have better understanding of postural mechanics during functional tasks   ? Status Partially Met   ?  ? PT SHORT TERM GOAL #4  ? Title R shoulder flexion and abduction AROM to have improved by at least 20 degrees and ER to at least 60 degrees   ? Status On-going   ? ?  ?  ? ?  ? ? ? ? PT  Long Term Goals - 06/23/21 1423   ? ?  ? PT LONG TERM GOAL #1  ? Title MMT to have improved by at least 1 grade in all weak groups   ? Time 10   ? Period Weeks   ? Status New   ? Target Date 09/01/21   ?  ? PT LONG TERM GOAL #2  ? Title B shoulder flexion AROM to be at least 100 degrees in sitting, ABD AROM to be at least 90 degrees in sitting   ? Time 10   ? Period Weeks   ? Status New   ?  ? PT LONG TERM GOAL #3  ? Title Will be able to wash hair and perform functional hygiene without increased pain and minimal compensation patterns   ? Time 10   ? Period Weeks   ? Status New   ?  ? PT LONG TERM GOAL #4  ? Title Will be about to use BUEs to cook a meal without increased pain or difficulty   ? Time 10   ? Period Weeks   ? Status New   ? ?  ?  ? ?  ? ? ? ? ? ? ? ? Plan - 07/09/21 1054   ? ? Clinical Impression Statement Pt enters reporting less tightness form last session. She has better PROM in both shoulders today with manual therapy. R shoulder flexion remains very weak requiring assist to complete motion in supine position. Postural cues required with standing rows and extensions.   ? Personal Factors and Comorbidities Comorbidity 3+   ? Comorbidities ESRD, HTN, Lupus   ? Examination-Activity Limitations Bathing;Reach  Overhead;Caring for Others;Sleep;Carry;Hygiene/Grooming;Lift   ? Examination-Participation Restrictions Cleaning;Community Activity;Driving   ? Stability/Clinical Decision Making Evolving/Moderate complexity   ? Rehab Potential Good   ? PT Frequency 2x / week   ? PT Treatment/Interventions ADLs/Self Care Home Management;Electrical Stimulation;Moist Heat;Therapeutic exercise;Therapeutic activities;Functional mobility training;Cryotherapy;Ultrasound;Iontophoresis 69m/ml Dexamethasone;Patient/family education;Manual techniques;Passive range of motion;Dry needling;Energy conservation;Taping;Vasopneumatic Device   ? PT Next Visit Plan focus on shoulder ROM ad strength, postural training   ? ?  ?  ? ?  ? ? ?Patient will benefit from skilled therapeutic intervention in order to improve the following deficits and impairments:  Decreased range of motion, Increased fascial restricitons, Increased muscle spasms, Impaired UE functional use, Decreased activity tolerance, Pain, Hypomobility, Impaired flexibility, Improper body mechanics, Decreased mobility, Decreased strength, Postural dysfunction ? ?Visit Diagnosis: ?Stiffness of right shoulder, not elsewhere classified ? ?Muscle weakness (generalized) ? ?Stiffness of left shoulder, not elsewhere classified ? ? ? ? ?Problem List ?Patient Active Problem List  ? Diagnosis Date Noted  ? Cellulitis of chest wall   ? [place] ([place] 01/26/2017  ? Ex[place]ation of systemic [place]matosus ([place] 01/26/2017  ? 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 Tachypnea 01/24/2017  ? Dyspnea 01/24/2017  ? Macular rash 01/24/2017  ? Abscess of left axilla 01/24/2017  ? Abscess of axilla, left 01/23/2017  ? Hypotension   ? Sepsis ([place] 05/15/2016  ? Elevated troponin 05/15/2016  ? HTN (hypertension) 05/15/2016  ? SLE (systemic lupus erythematosus) ([place] 05/15/2016  ? ? ?RScot Jun PTA ?07/09/2021, 10:59 AM ? ?[place] ?[address] ?[place] [address]?Phone: [phone]  Fax:  [phone]? ?Name: BIVANIA TEAGARDEN?MRN: [id number]?Date of Birth: [birth year]? ? ? ?

## 2021-07-12 ENCOUNTER — Encounter: Payer: Self-pay | Admitting: Physical Therapy

## 2021-07-12 ENCOUNTER — Ambulatory Visit: Payer: Medicare Other | Admitting: Physical Therapy

## 2021-07-12 DIAGNOSIS — M25611 Stiffness of right shoulder, not elsewhere classified: Secondary | ICD-10-CM

## 2021-07-12 DIAGNOSIS — M25612 Stiffness of left shoulder, not elsewhere classified: Secondary | ICD-10-CM

## 2021-07-12 DIAGNOSIS — M6281 Muscle weakness (generalized): Secondary | ICD-10-CM

## 2021-07-12 NOTE — Therapy (Signed)
[facility] ?[place] [address] ?[place], [name], [address] ?Phone: [phone]   Fax:  [phone] ? ?Physical Therapy Treatment ? ?Patient Details  ?Name: Anne Robinson ?MRN: [id number] ?Date of Birth: 1990-04-04 ?Referring Provider (PT): Christian Pean ? ? ?Encounter Date: 07/12/2021 ? ? PT End of Session - 07/12/21 1053   ? ? Visit Number 4   ? Date for PT Re-Evaluation 09/01/21   ? Authorization Time Period 06/23/21 to 09/01/21   ? PT Start Time 1015   ? PT Stop Time 1058   ? PT Time Calculation (min) 43 min   ? Activity Tolerance Patient tolerated treatment well   ? Behavior During Therapy [facility] for tasks assessed/performed   ? ?  ?  ? ?  ? ? ?Past Medical History:  ?Diagnosis Date  ? HTN (hypertension) 05/15/2016  ? Lupus ([place])   ? Lupus nephritis ([place])   ? Pseudotumor cerebri   ? Renal disorder   ? Thyroid disease   ? ? ?Past Surgical History:  ?Procedure Laterality Date  ? INSERTION OF DIALYSIS CATHETER    ? RENAL BIOPSY    ? SHOULDER SURGERY    ? ? ?There were no vitals filed for this visit. ? ? Subjective Assessment - 07/12/21 1019   ? ? Subjective "Not bad, better"   ? Pertinent History ESRD. Lupus, HTN   ? Currently in Pain? Yes   ? Pain Score 3    ? Pain Location Arm   ? Pain Orientation Left   ? ?  ?  ? ?  ? ? ? ? ? ? ? ? ? ? ? ? ? ? ? ? ? ? ? ? [facility] Adult PT Treatment/Exercise - 07/12/21 0001   ? ?  ? Shoulder Exercises: Seated  ? Other Seated Exercises Chest press w/ cane 2x10   ?  ? Shoulder Exercises: Standing  ? Extension Strengthening;Both;Theraband;15 reps   x2  ? Theraband Level (Shoulder Extension) Level 2 (Red)   ? Row Strengthening;Both;Theraband;15 reps   x2  ? Theraband Level (Shoulder Row) Level 3 (Green)   ? Other Standing Exercises AAROM flex, Ext, IR 2x10   ? Other Standing Exercises hammer curls 1lb 2x10; Ladder flex x5 each   ?  ? Shoulder Exercises: ROM/Strengthening  ? Nustep L3 x 6 min   ?  ? Manual Therapy  ? Manual Therapy Passive  ROM   ? Manual therapy comments Soft end feel   ? Soft tissue mobilization Gentle massage w/ lotion to lateral aspect of R upper arm to facilitate mobility within tissue structures and to decrease tension, edema, and pain   ? ?  ?  ? ?  ? ? ? ? ? ? ? ? ? ? ? ? PT Short Term Goals - 07/09/21 1058   ? ?  ? PT SHORT TERM GOAL #1  ? Title Will be compliant with approrpriate progressive HEP   ? Status On-going   ?  ? PT SHORT TERM GOAL #2  ? Title Pain to be no more than 4/10 in B shoulders   ? Status Partially Met   ?  ? PT SHORT TERM GOAL #3  ? Title Will have better understanding of postural mechanics during functional tasks   ? Status Partially Met   ?  ? PT SHORT TERM GOAL #4  ? Title R shoulder flexion and abduction AROM to have improved by at least 20 degrees and ER to at  least 60 degrees   ? Status On-going   ? ?  ?  ? ?  ? ? ? ? PT Long Term Goals - 06/23/21 1423   ? ?  ? PT LONG TERM GOAL #1  ? Title MMT to have improved by at least 1 grade in all weak groups   ? Time 10   ? Period Weeks   ? Status New   ? Target Date 09/01/21   ?  ? PT LONG TERM GOAL #2  ? Title B shoulder flexion AROM to be at least 100 degrees in sitting, ABD AROM to be at least 90 degrees in sitting   ? Time 10   ? Period Weeks   ? Status New   ?  ? PT LONG TERM GOAL #3  ? Title Will be able to wash hair and perform functional hygiene without increased pain and minimal compensation patterns   ? Time 10   ? Period Weeks   ? Status New   ?  ? PT LONG TERM GOAL #4  ? Title Will be about to use BUEs to cook a meal without increased pain or difficulty   ? Time 10   ? Period Weeks   ? Status New   ? ?  ?  ? ?  ? ? ? ? ? ? ? ? Plan - 07/12/21 1054   ? ? Clinical Impression Statement Pt continues to have limited ROM in both UE that appears to come from weakness. She has good bilateral PROM, with L shoulder passive ER being the most limited. Pt continues to required postural cues with rows and extensions.   ? Personal Factors and Comorbidities  Comorbidity 3+   ? Comorbidities ESRD, HTN, Lupus   ? Examination-Activity Limitations Bathing;Reach Overhead;Caring for Others;Sleep;Carry;Hygiene/Grooming;Lift   ? Examination-Participation Restrictions Cleaning;Community Activity;Driving   ? Stability/Clinical Decision Making Evolving/Moderate complexity   ? Rehab Potential Good   ? PT Frequency 2x / week   ? PT Treatment/Interventions ADLs/Self Care Home Management;Electrical Stimulation;Moist Heat;Therapeutic exercise;Therapeutic activities;Functional mobility training;Cryotherapy;Ultrasound;Iontophoresis 4mg /ml Dexamethasone;Patient/family education;Manual techniques;Passive range of motion;Dry needling;Energy conservation;Taping;Vasopneumatic Device   ? PT Next Visit Plan focus on shoulder ROM ad strength, postural training   ? ?  ?  ? ?  ? ? ?Patient will benefit from skilled therapeutic intervention in order to improve the following deficits and impairments:  Decreased range of motion, Increased fascial restricitons, Increased muscle spasms, Impaired UE functional use, Decreased activity tolerance, Pain, Hypomobility, Impaired flexibility, Improper body mechanics, Decreased mobility, Decreased strength, Postural dysfunction ? ?Visit Diagnosis: ?Stiffness of right shoulder, not elsewhere classified ? ?Muscle weakness (generalized) ? ?Stiffness of left shoulder, not elsewhere classified ? ? ? ? ?Problem List ?Patient Active Problem List  ? Diagnosis Date Noted  ? Cellulitis of chest wall   ? Acute renal failure ([place]) 01/26/2017  ? Exacerbation of systemic lupus erythematosus ([place]) 01/26/2017  ? 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 Tachypnea 01/24/2017  ? Dyspnea 01/24/2017  ? Macular rash 01/24/2017  ? Abscess of left axilla 01/24/2017  ? Abscess of axilla, left 01/23/2017  ? Hypotension   ? Sepsis ([place]) 05/15/2016  ? Elevated troponin 05/15/2016  ? HTN (hypertension) 05/15/2016  ? SLE (systemic lupus erythematosus) ([place]) 05/15/2016  ? ? ?Scot Jun,  PTA ?07/12/2021, 11:00 AM ? ?[place] ?[address] ?[place], [name], [address] ?Phone: [phone]   Fax:  [phone] ? ?Name: Anne Robinson ?MRN: [id number] ?Date of Birth:  10/21/1990 ? ? ? ?

## 2021-07-14 ENCOUNTER — Encounter: Payer: Self-pay | Admitting: Physical Therapy

## 2021-07-14 ENCOUNTER — Ambulatory Visit: Payer: Medicare Other | Admitting: Physical Therapy

## 2021-07-14 DIAGNOSIS — M6281 Muscle weakness (generalized): Secondary | ICD-10-CM

## 2021-07-14 DIAGNOSIS — M25611 Stiffness of right shoulder, not elsewhere classified: Secondary | ICD-10-CM | POA: Diagnosis not present

## 2021-07-14 DIAGNOSIS — M25612 Stiffness of left shoulder, not elsewhere classified: Secondary | ICD-10-CM

## 2021-07-14 NOTE — Therapy (Signed)
[facility] ?[place] [address] ?[place], [name], [address] ?Phone: [phone]   Fax:  [phone] ? ?Physical Therapy Treatment ? ?Patient Details  ?Name: Anne Robinson ?MRN: [id number] ?Date of Birth: 1990/05/13 ?Referring Provider (PT): Christian Pean ? ? ?Encounter Date: 07/14/2021 ? ? PT End of Session - 07/14/21 1106   ? ? Visit Number 5   ? Number of Visits 20   ? Date for PT Re-Evaluation 09/01/21   ? Authorization Type MCR and UHC   ? PT Start Time 1015   ? PT Stop Time 1100   ? PT Time Calculation (min) 45 min   ? Activity Tolerance Patient tolerated treatment well   ? Behavior During Therapy [facility] for tasks assessed/performed   ? ?  ?  ? ?  ? ? ?Past Medical History:  ?Diagnosis Date  ? HTN (hypertension) 05/15/2016  ? Lupus ([place])   ? Lupus nephritis ([place])   ? Pseudotumor cerebri   ? Renal disorder   ? Thyroid disease   ? ? ?Past Surgical History:  ?Procedure Laterality Date  ? INSERTION OF DIALYSIS CATHETER    ? RENAL BIOPSY    ? SHOULDER SURGERY    ? ? ?There were no vitals filed for this visit. ? ? Subjective Assessment - 07/14/21 1023   ? ? Subjective Doing okay   ? Currently in Pain? Yes   ? Pain Score 2    ? Pain Location Arm   ? Pain Orientation Left   ? Aggravating Factors  activity with arms   ? ?  ?  ? ?  ? ? ? ? ? ? ? ? ? ? ? ? ? ? ? ? ? ? ? ? [place] Adult PT Treatment/Exercise - 07/14/21 0001   ? ?  ? Shoulder Exercises: Seated  ? Extension Both;20 reps;Theraband   ? Theraband Level (Shoulder Extension) Level 2 (Red)   ? Row Both;20 reps;Theraband   ? Theraband Level (Shoulder Row) Level 2 (Red)   ? External Rotation Both;20 reps;Theraband   ? Theraband Level (Shoulder External Rotation) Level 1 (Yellow)   ? Other Seated Exercises AAROM flexion and abduction, ball rolling for flexion   ? Other Seated Exercises use of the UE ranger flexion, CW/CCW circles, 1# biceps, red tband triceps   ?  ? Shoulder Exercises: ROM/Strengthening  ? UBE (Upper Arm  Bike) L1 x4 min   ? Nustep L4 x 6 min   ? ?  ?  ? ?  ? ? ? ? ? ? ? ? ? ? ? ? PT Short Term Goals - 07/14/21 1203   ? ?  ? PT SHORT TERM GOAL #1  ? Title Will be compliant with approrpriate progressive HEP   ? Status Partially Met   ? ?  ?  ? ?  ? ? ? ? PT Long Term Goals - 07/14/21 1203   ? ?  ? PT LONG TERM GOAL #1  ? Title MMT to have improved by at least 1 grade in all weak groups   ? Status On-going   ? ?  ?  ? ?  ? ? ? ? ? ? ? ? Plan - 07/14/21 1202   ? ? Clinical Impression Statement Patient continues to use the upper traps to move the shoulders, she is very weak, I used the UE ranger and also did a lot of AAROM , with arms at side she can do bi's and tri's   ?  PT Next Visit Plan focus on shoulder ROM ad strength, postural training   ? Consulted and Agree with Plan of Care Patient   ? ?  ?  ? ?  ? ? ?Patient will benefit from skilled therapeutic intervention in order to improve the following deficits and impairments:  Decreased range of motion, Increased fascial restricitons, Increased muscle spasms, Impaired UE functional use, Decreased activity tolerance, Pain, Hypomobility, Impaired flexibility, Improper body mechanics, Decreased mobility, Decreased strength, Postural dysfunction ? ?Visit Diagnosis: ?Stiffness of right shoulder, not elsewhere classified ? ?Muscle weakness (generalized) ? ?Stiffness of left shoulder, not elsewhere classified ? ? ? ? ?Problem List ?Patient Active Problem List  ? Diagnosis Date Noted  ? Cellulitis of chest wall   ? Acute renal failure ([place]) 01/26/2017  ? Exacerbation of systemic lupus erythematosus ([place]) 01/26/2017  ? 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 Tachypnea 01/24/2017  ? Dyspnea 01/24/2017  ? Macular rash 01/24/2017  ? Abscess of left axilla 01/24/2017  ? Abscess of axilla, left 01/23/2017  ? Hypotension   ? Sepsis ([place]) 05/15/2016  ? Elevated troponin 05/15/2016  ? HTN (hypertension) 05/15/2016  ? SLE (systemic lupus erythematosus) ([place]) 05/15/2016  ? ? Sumner Boast, PT ?07/14/2021, 12:05 PM ? ?[place] ?[address] ?[place], [name], [address] ?Phone: [phone]   Fax:  [phone] ? ?Name: Anne Robinson ?MRN: [id number] ?Date of Birth: Nov 23, 1990 ? ? ? ?

## 2021-07-19 ENCOUNTER — Ambulatory Visit: Payer: Medicare Other | Admitting: Physical Therapy

## 2021-07-19 DIAGNOSIS — M25612 Stiffness of left shoulder, not elsewhere classified: Secondary | ICD-10-CM

## 2021-07-19 DIAGNOSIS — M6281 Muscle weakness (generalized): Secondary | ICD-10-CM

## 2021-07-19 DIAGNOSIS — R29898 Other symptoms and signs involving the musculoskeletal system: Secondary | ICD-10-CM

## 2021-07-19 DIAGNOSIS — M25611 Stiffness of right shoulder, not elsewhere classified: Secondary | ICD-10-CM

## 2021-07-19 NOTE — Therapy (Signed)
[facility] ?[place] ?[place], [name], [address] ?Phone: [phone]   Fax:  [phone] ? ?Physical Therapy Treatment ? ?Patient Details  ?Name: Anne Robinson ?MRN: [id number] ?Date of Birth: March 03, 1991 ?Referring Provider (PT): Christian Pean ? ? ?Encounter Date: 07/19/2021 ? ? PT End of Session - 07/19/21 1224   ? ? Visit Number 6   ? Date for PT Re-Evaluation 09/01/21   ? Authorization Type MCR and UHC   ? Authorization Time Period 06/23/21 to 09/01/21   ? PT Start Time 1145   ? PT Stop Time 1225   ? PT Time Calculation (min) 40 min   ? Activity Tolerance Patient tolerated treatment well   ? Behavior During Therapy [facility] for tasks assessed/performed   ? ?  ?  ? ?  ? ? ?Past Medical History:  ?Diagnosis Date  ? HTN (hypertension) 05/15/2016  ? Lupus ([place])   ? Lupus nephritis ([place])   ? Pseudotumor cerebri   ? Renal disorder   ? Thyroid disease   ? ? ?Past Surgical History:  ?Procedure Laterality Date  ? INSERTION OF DIALYSIS CATHETER    ? RENAL BIOPSY    ? SHOULDER SURGERY    ? ? ?There were no vitals filed for this visit. ? ? Subjective Assessment - 07/19/21 1146   ? ? Subjective Pretty good   ? Currently in Pain? No/denies   ? ?  ?  ? ?  ? ? ? ? ? ? ? ? ? ? ? ? ? ? ? ? ? ? ? ? [facility] Adult PT Treatment/Exercise - 07/19/21 0001   ? ?  ? Shoulder Exercises: Seated  ? Internal Rotation Strengthening;Both;Theraband;20 reps   ? Theraband Level (Shoulder Internal Rotation) Level 1 (Yellow)   ? Other Seated Exercises AAROM flexion and abduction, ball rolling for flexion   ? Other Seated Exercises use of the UE ranger flexion, CW/CCW circles, 1# biceps, red tband triceps   ?  ? Shoulder Exercises: Standing  ? External Rotation Strengthening;Both;20 reps;Theraband   ? Theraband Level (Shoulder External Rotation) Level 1 (Yellow)   ? Extension Strengthening;Both;Theraband;15 reps   x2  ? Theraband Level (Shoulder Extension) Level 3 (Green)   ? Row  Strengthening;Both;15 reps;Theraband   x2  ? Other Standing Exercises AAROM flex, Ext, IR 2x10   ?  ? Shoulder Exercises: ROM/Strengthening  ? UBE (Upper Arm Bike) L1 x4 min   ? Nustep L4 x 6 min   ? ?  ?  ? ?  ? ? ? ? ? ? ? ? ? ? ? ? PT Short Term Goals - 07/14/21 1203   ? ?  ? PT SHORT TERM GOAL #1  ? Title Will be compliant with approrpriate progressive HEP   ? Status Partially Met   ? ?  ?  ? ?  ? ? ? ? PT Long Term Goals - 07/14/21 1203   ? ?  ? PT LONG TERM GOAL #1  ? Title MMT to have improved by at least 1 grade in all weak groups   ? Status On-going   ? ?  ?  ? ?  ? ? ? ? ? ? ? ? Plan - 07/19/21 1225   ? ? Clinical Impression Statement Continues with ROM and strengthening interventions. She remains very weak in both UE. Upper traps does compensate with some shoulder movements. No reports of pain during session.   ? Comorbidities ESRD, HTN, Lupus   ?  Examination-Activity Limitations Bathing;Reach Overhead;Caring for Others;Sleep;Carry;Hygiene/Grooming;Lift   ? Examination-Participation Restrictions Cleaning;Community Activity;Driving   ? Stability/Clinical Decision Making Evolving/Moderate complexity   ? Rehab Potential Good   ? PT Frequency 2x / week   ? PT Treatment/Interventions ADLs/Self Care Home Management;Electrical Stimulation;Moist Heat;Therapeutic exercise;Therapeutic activities;Functional mobility training;Cryotherapy;Ultrasound;Iontophoresis 5m/ml Dexamethasone;Patient/family education;Manual techniques;Passive range of motion;Dry needling;Energy conservation;Taping;Vasopneumatic Device   ? PT Next Visit Plan focus on shoulder ROM ad strength, postural training   ? ?  ?  ? ?  ? ? ?Patient will benefit from skilled therapeutic intervention in order to improve the following deficits and impairments:  Decreased range of motion, Increased fascial restricitons, Increased muscle spasms, Impaired UE functional use, Decreased activity tolerance, Pain, Hypomobility, Impaired flexibility, Improper body  mechanics, Decreased mobility, Decreased strength, Postural dysfunction ? ?Visit Diagnosis: ?Stiffness of right shoulder, not elsewhere classified ? ?Stiffness of left shoulder, not elsewhere classified ? ?Other symptoms and signs involving the musculoskeletal system ? ?Muscle weakness (generalized) ? ? ? ? ?Problem List ?Patient Active Problem List  ? Diagnosis Date Noted  ? Cellulitis of chest wall   ? Acute renal failure ([place] 01/26/2017  ? Exacerbation of systemic lupus erythematosus ([place] 01/26/2017  ? 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 Tachypnea 01/24/2017  ? Dyspnea 01/24/2017  ? Macular rash 01/24/2017  ? Abscess of left axilla 01/24/2017  ? Abscess of axilla, left 01/23/2017  ? Hypotension   ? Sepsis ([place] 05/15/2016  ? Elevated troponin 05/15/2016  ? HTN (hypertension) 05/15/2016  ? SLE (systemic lupus erythematosus) ([place] 05/15/2016  ? ? ?RScot Jun PTA ?07/19/2021, 12:27 PM ? ?[place] ?[place] [address] ?[place] [address]?Phone: [phone]  Fax:  [phone]? ?Name: BHAILYNN SLOVACEK?MRN: [id number]?Date of Birth: [birth year]? ? ? ?

## 2021-07-23 ENCOUNTER — Ambulatory Visit: Payer: Medicare Other | Admitting: Physical Therapy

## 2021-07-23 ENCOUNTER — Encounter: Payer: Self-pay | Admitting: Physical Therapy

## 2021-07-23 DIAGNOSIS — M25612 Stiffness of left shoulder, not elsewhere classified: Secondary | ICD-10-CM

## 2021-07-23 DIAGNOSIS — M25611 Stiffness of right shoulder, not elsewhere classified: Secondary | ICD-10-CM | POA: Diagnosis not present

## 2021-07-23 DIAGNOSIS — M6281 Muscle weakness (generalized): Secondary | ICD-10-CM

## 2021-07-23 NOTE — Therapy (Signed)
[place]. [place], [name], [address] Phone: [phone]   Fax:  [phone]  Physical Therapy Treatment  Patient Details  Name: Anne Robinson MRN: [id number] Date of Birth: 1990-06-28 Referring Provider (PT): Marita Kansas   Encounter Date: 07/23/2021   PT End of Session - 07/23/21 1050     Visit Number 7    Date for PT Re-Evaluation 09/01/21    Authorization Type MCR and UHC    Authorization Time Period 06/23/21 to 09/01/21    PT Start Time 3818    PT Stop Time 1055    PT Time Calculation (min) 40 min    Activity Tolerance Patient tolerated treatment well    Behavior During Therapy [facility] for tasks assessed/performed             Past Medical History:  Diagnosis Date   HTN (hypertension) 05/15/2016   Lupus ([place])    Lupus nephritis ([place])    Pseudotumor cerebri    Renal disorder    Thyroid disease     Past Surgical History:  Procedure Laterality Date   INSERTION OF DIALYSIS CATHETER     RENAL BIOPSY     SHOULDER SURGERY      There were no vitals filed for this visit.   Subjective Assessment - 07/23/21 1016     Subjective Doing good, can tell some improvement, does not feel as tight    Currently in Pain? No/denies                               [facility] Adult PT Treatment/Exercise - 07/23/21 0001       Shoulder Exercises: Seated   Other Seated Exercises use of the UE ranger flexion, CW/CCW circles, 2# biceps, red tband triceps      Shoulder Exercises: Standing   Extension Strengthening;Both;Theraband;20 reps    Theraband Level (Shoulder Extension) Level 2 (Red)    Other Standing Exercises AAROM flex, Ext, IR, Abd 2x10    Other Standing Exercises AAROM flex on wall pillow case      Shoulder Exercises: ROM/Strengthening   Nustep L4 x 6 min      Shoulder Exercises: Power Leisure centre manager & Lats 20lb 2x10   Lats very difficult limited ROM      Manual Therapy   Manual Therapy Passive ROM    Manual therapy comments Soft end feel    Passive ROM Bilateral shoulders in all directions.                       PT Short Term Goals - 07/14/21 1203       PT SHORT TERM GOAL #1   Title Will be compliant with approrpriate progressive HEP    Status Partially Met               PT Long Term Goals - 07/14/21 1203       PT LONG TERM GOAL #1   Title MMT to have improved by at least 1 grade in all weak groups    Status On-going                   Plan - 07/23/21 1052     Clinical Impression Statement Pt enter feeling well. Bilateral UE remains is limited due to weakness. Postural compensation noted for to UE weakness. Lat pull downs was very  difficult for patient. She did well with seated rows. Pt has full PROM in both UE R shoulder joint moves more freely but doe have more pain.    Personal Factors and Comorbidities Comorbidity 3+    Comorbidities ESRD, HTN, Lupus    Examination-Activity Limitations Bathing;Reach Overhead;Caring for Others;Sleep;Carry;Hygiene/Grooming;Lift    Examination-Participation Restrictions Cleaning;Community Activity;Driving    Stability/Clinical Decision Making Evolving/Moderate complexity    Rehab Potential Good    PT Frequency 2x / week    PT Treatment/Interventions ADLs/Self Care Home Management;Electrical Stimulation;Moist Heat;Therapeutic exercise;Therapeutic activities;Functional mobility training;Cryotherapy;Ultrasound;Iontophoresis 33m/ml Dexamethasone;Patient/family education;Manual techniques;Passive range of motion;Dry needling;Energy conservation;Taping;Vasopneumatic Device    PT Next Visit Plan focus on shoulder ROM ad strength, postural training             Patient will benefit from skilled therapeutic intervention in order to improve the following deficits and impairments:  Decreased range of motion, Increased fascial restricitons, Increased muscle spasms,  Impaired UE functional use, Decreased activity tolerance, Pain, Hypomobility, Impaired flexibility, Improper body mechanics, Decreased mobility, Decreased strength, Postural dysfunction  Visit Diagnosis: Stiffness of left shoulder, not elsewhere classified  Stiffness of right shoulder, not elsewhere classified  Muscle weakness (generalized)     Problem List Patient Active Problem List   Diagnosis Date Noted   Cellulitis of chest wall    Acute renal failure ([facility]) 01/26/2017   Exacerbation of systemic lupus erythematosus ([place] 01/26/2017   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

## 2021-07-26 ENCOUNTER — Ambulatory Visit: Payer: Medicare Other | Admitting: Physical Therapy

## 2021-07-26 ENCOUNTER — Encounter: Payer: Self-pay | Admitting: Physical Therapy

## 2021-07-26 DIAGNOSIS — M25611 Stiffness of right shoulder, not elsewhere classified: Secondary | ICD-10-CM | POA: Diagnosis not present

## 2021-07-26 DIAGNOSIS — M25612 Stiffness of left shoulder, not elsewhere classified: Secondary | ICD-10-CM

## 2021-07-26 DIAGNOSIS — M6281 Muscle weakness (generalized): Secondary | ICD-10-CM

## 2021-07-26 NOTE — Therapy (Signed)
[place]. [place], [name], [address] Phone: [phone]   Fax:  [phone]  Physical Therapy Treatment  Patient Details  Name: Anne Robinson MRN: [id number] Date of Birth: [birth year] Referring Provider (PT): Marita Kansas   Encounter Date: 07/26/2021   PT End of Session - 07/26/21 1056     Visit Number 8    Date for PT Re-Evaluation 09/01/21    Authorization Type MCR and UHC    PT Start Time 7341    PT Stop Time 1057    PT Time Calculation (min) 42 min    Activity Tolerance Patient tolerated treatment well    Behavior During Therapy [facility] for tasks assessed/performed             Past Medical History:  Diagnosis Date   HTN (hypertension) 05/15/2016   Lupus ([place])    Lupus nephritis ([place])    Pseudotumor cerebri    Renal disorder    Thyroid disease     Past Surgical History:  Procedure Laterality Date   INSERTION OF DIALYSIS CATHETER     RENAL BIOPSY     SHOULDER SURGERY      There were no vitals filed for this visit.   Subjective Assessment - 07/26/21 1020     Subjective "Ok"    Currently in Pain? Yes    Pain Score 2     Pain Location Shoulder    Pain Orientation Left                               [facility] Adult PT Treatment/Exercise - 07/26/21 0001       Shoulder Exercises: Supine   External Rotation Strengthening;Both;Weights;10 reps    External Rotation Weight (lbs) o1=9,,,,,,,,,,,,    Flexion AAROM;Both;5 reps   x2 cane     Shoulder Exercises: Standing   External Rotation Strengthening;Both;20 reps;Theraband    Theraband Level (Shoulder External Rotation) Level 1 (Yellow)    Extension Strengthening;Both;Theraband;20 reps    Theraband Level (Shoulder Extension) Level 2 (Red)    Row Strengthening;5 reps;20 reps;Theraband      Shoulder Exercises: ROM/Strengthening   UBE (Upper Arm Bike) L1 x4 min    Nustep L4 x 6 min      Shoulder Exercises: Power Futures trader Rows 15lb 3x10      Manual Therapy   Manual Therapy Passive ROM    Manual therapy comments Soft end feel for R    Passive ROM Bilateral shoulders in all directions.                       PT Short Term Goals - 07/14/21 1203       PT SHORT TERM GOAL #1   Title Will be compliant with approrpriate progressive HEP    Status Partially Met               PT Long Term Goals - 07/14/21 1203       PT LONG TERM GOAL #1   Title MMT to have improved by at least 1 grade in all weak groups    Status On-going                   Plan - 07/26/21 1056     Clinical Impression Statement Pt enters with low pain rating. Pt has limited AROM in both UE's due  to weakness. Postural compensation noted with all strengthening interventions. R shoulder pain reported with supine flexion. Pt has limited passive external rotation of RUE with MT. R shoulder continues to cause more pain with passive motion but it moves more freely compared to L.    Comorbidities ESRD, HTN, Lupus    Examination-Activity Limitations Bathing;Reach Overhead;Caring for Others;Sleep;Carry;Hygiene/Grooming;Lift    Examination-Participation Restrictions Cleaning;Community Activity;Driving    Rehab Potential Good    PT Frequency 2x / week    PT Treatment/Interventions ADLs/Self Care Home Management;Electrical Stimulation;Moist Heat;Therapeutic exercise;Therapeutic activities;Functional mobility training;Cryotherapy;Ultrasound;Iontophoresis 55m/ml Dexamethasone;Patient/family education;Manual techniques;Passive range of motion;Dry needling;Energy conservation;Taping;Vasopneumatic Device    PT Next Visit Plan focus on shoulder ROM ad strength, postural training             Patient will benefit from skilled therapeutic intervention in order to improve the following deficits and impairments:  Decreased range of motion, Increased fascial restricitons, Increased muscle spasms,  Impaired UE functional use, Decreased activity tolerance, Pain, Hypomobility, Impaired flexibility, Improper body mechanics, Decreased mobility, Decreased strength, Postural dysfunction  Visit Diagnosis: Stiffness of left shoulder, not elsewhere classified  Muscle weakness (generalized)  Stiffness of right shoulder, not elsewhere classified     Problem List Patient Active Problem List   Diagnosis Date Noted   Cellulitis of chest wall    Acute renal failure ([place] 01/26/2017   Exacerbation of systemic lupus erythematosus ([place] 01/26/2017   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

## 2021-07-30 ENCOUNTER — Ambulatory Visit: Payer: Medicare Other | Admitting: Physical Therapy

## 2021-07-30 ENCOUNTER — Encounter: Payer: Self-pay | Admitting: Physical Therapy

## 2021-07-30 DIAGNOSIS — M25611 Stiffness of right shoulder, not elsewhere classified: Secondary | ICD-10-CM | POA: Diagnosis not present

## 2021-07-30 DIAGNOSIS — M25612 Stiffness of left shoulder, not elsewhere classified: Secondary | ICD-10-CM

## 2021-07-30 DIAGNOSIS — R29898 Other symptoms and signs involving the musculoskeletal system: Secondary | ICD-10-CM

## 2021-07-30 DIAGNOSIS — M6281 Muscle weakness (generalized): Secondary | ICD-10-CM

## 2021-07-30 NOTE — Therapy (Signed)
[place] [place], [name], [address] Phone: [phone]   Fax:  [phone]  Physical Therapy Treatment  Patient Details  Name: Anne Robinson MRN: [id number] Date of Birth: 13-Jan-1991 Referring Provider (PT): Marita Kansas   Encounter Date: 07/30/2021   PT End of Session - 07/30/21 1104     Visit Number 9    Number of Visits 20    Date for PT Re-Evaluation 09/01/21    Authorization Type MCR and UHC    Authorization Time Period 06/23/21 to 09/01/21    Progress Note Due on Visit 10    PT Start Time 1021   arrived a few minutes late   PT Stop Time 1057    PT Time Calculation (min) 36 min    Activity Tolerance Patient tolerated treatment well    Behavior During Therapy [facility] for tasks assessed/performed             Past Medical History:  Diagnosis Date   HTN (hypertension) 05/15/2016   Lupus ([place])    Lupus nephritis ([place])    Pseudotumor cerebri    Renal disorder    Thyroid disease     Past Surgical History:  Procedure Laterality Date   INSERTION OF DIALYSIS CATHETER     RENAL BIOPSY     SHOULDER SURGERY      There were no vitals filed for this visit.   Subjective Assessment - 07/30/21 1025     Subjective Doing OK, I am starting to notice a difference in my shoulders, R is still lagging behind the L    Pertinent History ESRD. Lupus, HTN    Patient Stated Goals get as much ROM in shoulders as possible, be more functional    Currently in Pain? No/denies                [facility] PT Assessment - 07/30/21 0001       PROM   Right Shoulder Flexion 130 Degrees   approximate   Right Shoulder ABduction 110 Degrees   approximate   Left Shoulder Flexion 170 Degrees   approximate   Left Shoulder ABduction 150 Degrees   approximate                          [facility] Adult PT Treatment/Exercise - 07/30/21 0001       Shoulder Exercises: Supine   Flexion AAROM;Both;10 reps   PT  assisted   ABduction AAROM;Right;Left;5 reps    Other Supine Exercises flexion/ABD/IR/ER stretches B      Shoulder Exercises: Seated   Other Seated Exercises UBE 1.0 2.5 min forward/2.5 min back                     PT Education - 07/30/21 1104     Education Details POC moving forward    Person(s) Educated Patient    Methods Explanation    Comprehension Verbalized understanding              PT Short Term Goals - 07/14/21 1203       PT SHORT TERM GOAL #1   Title Will be compliant with approrpriate progressive HEP    Status Partially Met               PT Long Term Goals - 07/14/21 1203       PT LONG TERM GOAL #1   Title MMT to have improved by at  least 1 grade in all weak groups    Status On-going                   Plan - 07/30/21 1104     Clinical Impression Statement Bre arrives today doing OK- really getting a lot of improvement in shoulder ROM, but R side continues to remain tight and more painful. Focused on ROM based activities this session. Will continue to advance as able and tolerated, I think there is still benefit to be had.    Personal Factors and Comorbidities Comorbidity 3+    Comorbidities ESRD, HTN, Lupus    Examination-Activity Limitations Bathing;Reach Overhead;Caring for Others;Sleep;Carry;Hygiene/Grooming;Lift    Examination-Participation Restrictions Cleaning;Community Activity;Driving    Stability/Clinical Decision Making Evolving/Moderate complexity    Clinical Decision Making Moderate    Rehab Potential Good    PT Frequency 2x / week    PT Duration Other (comment)    PT Treatment/Interventions ADLs/Self Care Home Management;Electrical Stimulation;Moist Heat;Therapeutic exercise;Therapeutic activities;Functional mobility training;Cryotherapy;Ultrasound;Iontophoresis 26m/ml Dexamethasone;Patient/family education;Manual techniques;Passive range of motion;Dry needling;Energy conservation;Taping;Vasopneumatic Device    PT  Next Visit Plan needs 10th visit note/formal objective measures    PT Home Exercise Plan scap retraction, L shoulder flexion, L shoulder ABD in supine    Consulted and Agree with Plan of Care Patient             Patient will benefit from skilled therapeutic intervention in order to improve the following deficits and impairments:  Decreased range of motion, Increased fascial restricitons, Increased muscle spasms, Impaired UE functional use, Decreased activity tolerance, Pain, Hypomobility, Impaired flexibility, Improper body mechanics, Decreased mobility, Decreased strength, Postural dysfunction  Visit Diagnosis: Stiffness of left shoulder, not elsewhere classified  Muscle weakness (generalized)  Stiffness of right shoulder, not elsewhere classified  Other symptoms and signs involving the musculoskeletal system     Problem List Patient Active Problem List   Diagnosis Date Noted   Cellulitis of chest wall    Acute renal failure (HCC) 01/26/2017   Exacerbation of systemic lupus erythematosus ([place] 01/26/2017   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

## 2021-08-04 ENCOUNTER — Ambulatory Visit: Payer: Medicare Other | Admitting: Physical Therapy

## 2021-08-04 ENCOUNTER — Encounter: Payer: Self-pay | Admitting: Physical Therapy

## 2021-08-04 DIAGNOSIS — M25611 Stiffness of right shoulder, not elsewhere classified: Secondary | ICD-10-CM | POA: Diagnosis not present

## 2021-08-04 DIAGNOSIS — M25612 Stiffness of left shoulder, not elsewhere classified: Secondary | ICD-10-CM

## 2021-08-04 DIAGNOSIS — M6281 Muscle weakness (generalized): Secondary | ICD-10-CM

## 2021-08-04 NOTE — Therapy (Signed)
[place]. [place], [name], [address] Phone: [phone]   Fax:  [phone]  Physical Therapy Treatment  Patient Details  Name: Anne Robinson MRN: [id number] Date of Birth: 24-Feb-1991 Referring Provider (PT): Marita Kansas   Encounter Date: 08/04/2021   PT End of Session - 08/04/21 1054     Visit Number 10    Date for PT Re-Evaluation 09/01/21    Authorization Time Period 06/23/21 to 09/01/21             Past Medical History:  Diagnosis Date   HTN (hypertension) 05/15/2016   Lupus ([place])    Lupus nephritis ([place])    Pseudotumor cerebri    Renal disorder    Thyroid disease     Past Surgical History:  Procedure Laterality Date   INSERTION OF DIALYSIS CATHETER     RENAL BIOPSY     SHOULDER SURGERY      There were no vitals filed for this visit.   Subjective Assessment - 08/04/21 1023     Subjective "its all right, a little tightness on the R side"    Currently in Pain? Yes    Pain Score 4     Pain Location Shoulder    Pain Orientation Left                [facility] PT Assessment - 08/04/21 0001       AROM   Right Shoulder Flexion 0 Degrees    Right Shoulder ABduction 44 Degrees    Left Shoulder Flexion 50 Degrees    Left Shoulder ABduction 43 Degrees                           [facility] Adult PT Treatment/Exercise - 08/04/21 0001       Shoulder Exercises: Supine   Flexion AAROM;Both;20 reps    ABduction AAROM;Right;Left;5 reps    Other Supine Exercises ER/IR 1lb x10    Other Supine Exercises Flex & Chest press with cane      Shoulder Exercises: Standing   Extension Strengthening;Both;Theraband;20 reps    Theraband Level (Shoulder Extension) Level 2 (Red)      Shoulder Exercises: ROM/Strengthening   UBE (Upper Arm Bike) L1 x2.5 min each      Shoulder Exercises: Power Futures trader Rows 20lb 3x10      Manual Therapy   Manual Therapy  Passive ROM    Manual therapy comments Soft end feel for R    Passive ROM Bilateral shoulders in all directions.                       PT Short Term Goals - 08/04/21 1056       PT SHORT TERM GOAL #1   Title Will be compliant with approrpriate progressive HEP    Status Achieved      PT SHORT TERM GOAL #2   Title Pain to be no more than 4/10 in B shoulders    Status Achieved      PT SHORT TERM GOAL #3   Title Will have better understanding of postural mechanics during functional tasks    Status Achieved      PT SHORT TERM GOAL #4   Status On-going               PT Long Term Goals - 08/04/21 1055  PT LONG TERM GOAL #1   Title MMT to have improved by at least 1 grade in all weak groups    Status On-going      PT LONG TERM GOAL #2   Title B shoulder flexion AROM to be at least 100 degrees in sitting, ABD AROM to be at least 90 degrees in sitting    Status On-going      PT LONG TERM GOAL #3   Title Will be able to wash hair and perform functional hygiene without increased pain and minimal compensation patterns    Status On-going      PT LONG TERM GOAL #4   Title Will be about to use BUEs to cook a meal without increased pain or difficulty    Status On-going                   Plan - 08/04/21 1056     Clinical Impression Statement Pt enters feeling well with some R shoulder. Little improvement noted with UE AROM. She has good PROM with both shoulders, but with increase pain on the R. Increase resistance tolerated with seated row. Pt able to tolerated 1lb resistance with supine shoulder ER/IR. She reports improved mobility at home.    Personal Factors and Comorbidities Comorbidity 3+    Examination-Activity Limitations Bathing;Reach Overhead;Caring for Others;Sleep;Carry;Hygiene/Grooming;Lift    Examination-Participation Restrictions Cleaning;Community Activity;Driving    Rehab Potential Good    PT Frequency 2x / week    PT  Treatment/Interventions ADLs/Self Care Home Management;Electrical Stimulation;Moist Heat;Therapeutic exercise;Therapeutic activities;Functional mobility training;Cryotherapy;Ultrasound;Iontophoresis 4mg /ml Dexamethasone;Patient/family education;Manual techniques;Passive range of motion;Dry needling;Energy conservation;Taping;Vasopneumatic Device    PT Next Visit Plan Bilateral shoulder ROM and strenght             Patient will benefit from skilled therapeutic intervention in order to improve the following deficits and impairments:  Decreased range of motion, Increased fascial restricitons, Increased muscle spasms, Impaired UE functional use, Decreased activity tolerance, Pain, Hypomobility, Impaired flexibility, Improper body mechanics, Decreased mobility, Decreased strength, Postural dysfunction  Visit Diagnosis: Stiffness of left shoulder, not elsewhere classified  Muscle weakness (generalized)     Problem List Patient Active Problem List   Diagnosis Date Noted   Cellulitis of chest wall    Acute renal failure (HCC) 01/26/2017   Exacerbation of systemic lupus erythematosus ([place]) 01/26/2017   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 [place], [name], [address] Phone: [phone]   Fax:  [phone]  Name: Anne Robinson MRN: [id number] Date of Birth: [birth year]

## 2021-08-06 ENCOUNTER — Encounter: Payer: Self-pay | Admitting: Physical Therapy

## 2021-08-06 ENCOUNTER — Ambulatory Visit: Payer: Medicare Other | Attending: Orthopedic Surgery | Admitting: Physical Therapy

## 2021-08-06 DIAGNOSIS — R29898 Other symptoms and signs involving the musculoskeletal system: Secondary | ICD-10-CM | POA: Diagnosis present

## 2021-08-06 DIAGNOSIS — M6281 Muscle weakness (generalized): Secondary | ICD-10-CM | POA: Insufficient documentation

## 2021-08-06 DIAGNOSIS — M25612 Stiffness of left shoulder, not elsewhere classified: Secondary | ICD-10-CM | POA: Diagnosis present

## 2021-08-06 DIAGNOSIS — M25611 Stiffness of right shoulder, not elsewhere classified: Secondary | ICD-10-CM | POA: Diagnosis present

## 2021-08-06 NOTE — Therapy (Signed)
[place]. [place], [name], [address] Phone: [phone]   Fax:  [phone]  Physical Therapy Treatment  Patient Details  Name: Anne Robinson MRN: [id number] Date of Birth: 18-Nov-1990 Referring Provider (PT): Marita Kansas   Encounter Date: 08/06/2021   PT End of Session - 08/06/21 1106     Visit Number 11    Number of Visits 20    Date for PT Re-Evaluation 09/01/21    Authorization Type MCR and UHC    Authorization Time Period 06/23/21 to 09/01/21    Progress Note Due on Visit 20    PT Start Time 1018    PT Stop Time 1057    PT Time Calculation (min) 39 min    Activity Tolerance Patient tolerated treatment well    Behavior During Therapy [facility] for tasks assessed/performed             Past Medical History:  Diagnosis Date   HTN (hypertension) 05/15/2016   Lupus ([place])    Lupus nephritis ([place])    Pseudotumor cerebri    Renal disorder    Thyroid disease     Past Surgical History:  Procedure Laterality Date   INSERTION OF DIALYSIS CATHETER     RENAL BIOPSY     SHOULDER SURGERY      There were no vitals filed for this visit.                      [facility] Adult PT Treatment/Exercise - 08/06/21 0001       Shoulder Exercises: Supine   Flexion AAROM;Right;Left;20 reps    Flexion Limitations wand with light assist from PT; + 1x10 supine flxion with 3#    Other Supine Exercises ceiling punches 3# 1x10, serratus punches 1x10 3#      Shoulder Exercises: Seated   Other Seated Exercises UBE L1 3 min forward/3 min backward    Other Seated Exercises bicep curls 1x15 B 3#; forward punches 1x10 3# L LUE      Shoulder Exercises: Standing   Other Standing Exercises shelf reaches L UE 1x10 0#      Manual Therapy   Manual Therapy Soft tissue mobilization    Soft tissue mobilization R anterior delt and biceps tendon                     PT Education - 08/06/21 1106      Education Details f/u with MD about structural change in R shoulder    Person(s) Educated Patient    Methods Explanation    Comprehension Verbalized understanding              PT Short Term Goals - 08/04/21 1056       PT SHORT TERM GOAL #1   Title Will be compliant with approrpriate progressive HEP    Status Achieved      PT SHORT TERM GOAL #2   Title Pain to be no more than 4/10 in B shoulders    Status Achieved      PT SHORT TERM GOAL #3   Title Will have better understanding of postural mechanics during functional tasks    Status Achieved      PT SHORT TERM GOAL #4   Status On-going               PT Long Term Goals - 08/04/21 1055       PT LONG TERM GOAL #1  Title MMT to have improved by at least 1 grade in all weak groups    Status On-going      PT LONG TERM GOAL #2   Title B shoulder flexion AROM to be at least 100 degrees in sitting, ABD AROM to be at least 90 degrees in sitting    Status On-going      PT LONG TERM GOAL #3   Title Will be able to wash hair and perform functional hygiene without increased pain and minimal compensation patterns    Status On-going      PT LONG TERM GOAL #4   Title Will be about to use BUEs to cook a meal without increased pain or difficulty    Status On-going                   Plan - 08/06/21 1107     Clinical Impression Statement [name] arrives today doing OK, no new complaints. Warmed up on UBE, then kept working on ROM and strength as able today. R shoulder is still much more stiff and more painful than the L but she has been able to do more in general. Has a new "divot"/change in structure anterior R shoulder as well as increased pain and difficulty with horizontal ADD- did not have any falls or new activities with R shoulder but this is different from previous sessions and is tender to touch, avoided doing too much with R arm today and advised her to get MD f/u to make sure everything is OK structurally.  Will continue to progress as able.    Personal Factors and Comorbidities Comorbidity 3+    Comorbidities ESRD, HTN, Lupus    Examination-Activity Limitations Bathing;Reach Overhead;Caring for Others;Sleep;Carry;Hygiene/Grooming;Lift    Examination-Participation Restrictions Cleaning;Community Activity;Driving    Stability/Clinical Decision Making Evolving/Moderate complexity    Clinical Decision Making Moderate    Rehab Potential Good    PT Frequency 2x / week    PT Duration Other (comment)    PT Treatment/Interventions ADLs/Self Care Home Management;Electrical Stimulation;Moist Heat;Therapeutic exercise;Therapeutic activities;Functional mobility training;Cryotherapy;Ultrasound;Iontophoresis 4mg /ml Dexamethasone;Patient/family education;Manual techniques;Passive range of motion;Dry needling;Energy conservation;Taping;Vasopneumatic Device    PT Next Visit Plan Bilateral shoulder ROM and strenght- caution with R shoulder until MD can clarify what's happening structurally    PT Home Exercise Plan scap retraction, L shoulder flexion, L shoulder ABD in supine    Consulted and Agree with Plan of Care Patient             Patient will benefit from skilled therapeutic intervention in order to improve the following deficits and impairments:  Decreased range of motion, Increased fascial restricitons, Increased muscle spasms, Impaired UE functional use, Decreased activity tolerance, Pain, Hypomobility, Impaired flexibility, Improper body mechanics, Decreased mobility, Decreased strength, Postural dysfunction  Visit Diagnosis: Stiffness of left shoulder, not elsewhere classified  Muscle weakness (generalized)  Stiffness of right shoulder, not elsewhere classified  Other symptoms and signs involving the musculoskeletal system     Problem List Patient Active Problem List   Diagnosis Date Noted   Cellulitis of chest wall    Acute renal failure (HCC) 01/26/2017   Exacerbation of systemic lupus  erythematosus ([place]) 01/26/2017   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 [place], [name], [address] Phone: [phone]   Fax:  [phone]  Name: OLUWANIFEMI PETITTI MRN: [id number] Date of Birth: 21-Nov-1990

## 2021-08-09 ENCOUNTER — Ambulatory Visit: Payer: Medicare Other

## 2021-08-09 DIAGNOSIS — M6281 Muscle weakness (generalized): Secondary | ICD-10-CM

## 2021-08-09 DIAGNOSIS — M25612 Stiffness of left shoulder, not elsewhere classified: Secondary | ICD-10-CM | POA: Diagnosis not present

## 2021-08-09 DIAGNOSIS — M25611 Stiffness of right shoulder, not elsewhere classified: Secondary | ICD-10-CM

## 2021-08-09 NOTE — Therapy (Signed)
[place]. [place], [name], [address] Phone: [phone]   Fax:  [phone]  Physical Therapy Treatment  Patient Details  Name: Anne Robinson MRN: [id number] Date of Birth: 01-14-1991 Referring Provider (PT): Marita Kansas   Encounter Date: 08/09/2021   PT End of Session - 08/09/21 1318     Visit Number 12    PT Start Time 4562    PT Stop Time 1400    PT Time Calculation (min) 42 min    Activity Tolerance Patient tolerated treatment well;Patient limited by pain    Behavior During Therapy [facility] for tasks assessed/performed              Past Medical History:  Diagnosis Date   HTN (hypertension) 05/15/2016   Lupus ([place])    Lupus nephritis ([place])    Pseudotumor cerebri    Renal disorder    Thyroid disease     Past Surgical History:  Procedure Laterality Date   INSERTION OF DIALYSIS CATHETER     RENAL BIOPSY     SHOULDER SURGERY      There were no vitals filed for this visit.  Subjective: R shoulder is feeling better since last visit. No pain to report and says only has pain with movements and when trying to reach up.    Today's Treatment UBE L2 61mins forward/3 mins backwards  PROM/manual therapy into flexion, abd, ER/IR Grade 2-3 posterior glides   Supine AAROM 1#rod 20reps SA punches in supine 1#on R, 3# on L 2x10 I and Y w/o weight 1x10  RedBand rows/ext/ER 2x10                                     PT Short Term Goals - 08/04/21 1056       PT SHORT TERM GOAL #1   Title Will be compliant with approrpriate progressive HEP    Status Achieved      PT SHORT TERM GOAL #2   Title Pain to be no more than 4/10 in B shoulders    Status Achieved      PT SHORT TERM GOAL #3   Title Will have better understanding of postural mechanics during functional tasks    Status Achieved      PT SHORT TERM GOAL #4   Status On-going               PT Long  Term Goals - 08/04/21 1055       PT LONG TERM GOAL #1   Title MMT to have improved by at least 1 grade in all weak groups    Status On-going      PT LONG TERM GOAL #2   Title B shoulder flexion AROM to be at least 100 degrees in sitting, ABD AROM to be at least 90 degrees in sitting    Status On-going      PT LONG TERM GOAL #3   Title Will be able to wash hair and perform functional hygiene without increased pain and minimal compensation patterns    Status On-going      PT LONG TERM GOAL #4   Title Will be about to use BUEs to cook a meal without increased pain or difficulty    Status On-going                   Plan - 08/09/21 1403  Clinical Impression Statement Patient returns to PT with no complaints of pain since last visit and has states that manual therapy helped a lot. She continues to has deficits with BUE strength and ROM, more so on the R than L. She as able to tolerate treatment well but is limited by pain for most exercises. Pt will continue to benefit from skilled PT to address BUE deficits to be able to complete ADLs.    Personal Factors and Comorbidities Comorbidity 3+    Comorbidities ESRD, HTN, Lupus    Examination-Activity Limitations Bathing;Reach Overhead;Caring for Others;Sleep;Carry;Hygiene/Grooming;Lift    Examination-Participation Restrictions Cleaning;Community Activity;Driving    Stability/Clinical Decision Making Evolving/Moderate complexity    Rehab Potential Good    PT Frequency 2x / week    PT Duration Other (comment)    PT Treatment/Interventions ADLs/Self Care Home Management;Electrical Stimulation;Moist Heat;Therapeutic exercise;Therapeutic activities;Functional mobility training;Cryotherapy;Ultrasound;Iontophoresis 4mg /ml Dexamethasone;Patient/family education;Manual techniques;Passive range of motion;Dry needling;Energy conservation;Taping;Vasopneumatic Device    PT Next Visit Plan Bilateral shoulder ROM and strenght- caution with R  shoulder until MD can clarify what's happening structurally    PT Home Exercise Plan scap retraction, L shoulder flexion, L shoulder ABD in supine    Consulted and Agree with Plan of Care Patient              Patient will benefit from skilled therapeutic intervention in order to improve the following deficits and impairments:  Decreased range of motion, Increased fascial restricitons, Increased muscle spasms, Impaired UE functional use, Decreased activity tolerance, Pain, Hypomobility, Impaired flexibility, Improper body mechanics, Decreased mobility, Decreased strength, Postural dysfunction  Visit Diagnosis: Muscle weakness (generalized)  Stiffness of right shoulder, not elsewhere classified     Problem List Patient Active Problem List   Diagnosis Date Noted   Cellulitis of chest wall    Acute renal failure (HCC) 01/26/2017   Exacerbation of systemic lupus erythematosus ([place]) 01/26/2017   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 [place], [name], [address] Phone: [phone]   Fax:  [phone]  Name: LAVEDA DEMEDEIROS MRN: [id number] Date of Birth: 1990-07-08  Andris Baumann, DPT 2:05PM

## 2021-08-11 ENCOUNTER — Ambulatory Visit: Payer: Medicare Other | Admitting: Physical Therapy

## 2021-08-11 ENCOUNTER — Encounter: Payer: Self-pay | Admitting: Physical Therapy

## 2021-08-11 DIAGNOSIS — M6281 Muscle weakness (generalized): Secondary | ICD-10-CM

## 2021-08-11 DIAGNOSIS — M25612 Stiffness of left shoulder, not elsewhere classified: Secondary | ICD-10-CM

## 2021-08-11 DIAGNOSIS — M25611 Stiffness of right shoulder, not elsewhere classified: Secondary | ICD-10-CM

## 2021-08-11 NOTE — Therapy (Signed)
[place]. [place], [name], [address] Phone: [phone]   Fax:  [phone]  Physical Therapy Treatment  Patient Details  Name: Anne Robinson MRN: [id number] Date of Birth: 13-Nov-1990 Referring Provider (PT): Marita Kansas   Encounter Date: 08/11/2021   PT End of Session - 08/11/21 1143     Visit Number 13    Date for PT Re-Evaluation 09/01/21    Authorization Type MCR and UHC    Activity Tolerance Patient tolerated treatment well;Patient limited by pain    Behavior During Therapy [facility] for tasks assessed/performed             Past Medical History:  Diagnosis Date   HTN (hypertension) 05/15/2016   Lupus ([place])    Lupus nephritis ([place])    Pseudotumor cerebri    Renal disorder    Thyroid disease     Past Surgical History:  Procedure Laterality Date   INSERTION OF DIALYSIS CATHETER     RENAL BIOPSY     SHOULDER SURGERY      There were no vitals filed for this visit.   Subjective Assessment - 08/11/21 1101     Subjective Got a headache today, R shoulder is still sore    Currently in Pain? Yes    Pain Score 4     Pain Location Shoulder    Pain Orientation Right                [facility] PT Assessment - 08/11/21 0001       AROM   Right Shoulder Flexion 52 Degrees    Right Shoulder ABduction 61 Degrees    Left Shoulder Flexion 82 Degrees    Left Shoulder ABduction 43 Degrees                           [facility] Adult PT Treatment/Exercise - 08/11/21 0001       Shoulder Exercises: Supine   Flexion AAROM;Right;Left;20 reps    Other Supine Exercises ER/IR 2lb x10      Shoulder Exercises: Standing   Flexion Both;AAROM;20 reps    Extension Strengthening;Both;Theraband;20 reps    Theraband Level (Shoulder Extension) Level 2 (Red)    Other Standing Exercises Bicept curls 2lb WaTE 2x15    Other Standing Exercises Underhand reb ball tosses x10 each, red ball behind th back  passes x 10 each      Shoulder Exercises: ROM/Strengthening   UBE (Upper Arm Bike) L1 x2.5 min each      Shoulder Exercises: Power Futures trader Rows 20lb 3x15      Manual Therapy   Passive ROM Bilateral shoulders in all directions.                       PT Short Term Goals - 08/04/21 1056       PT SHORT TERM GOAL #1   Title Will be compliant with approrpriate progressive HEP    Status Achieved      PT SHORT TERM GOAL #2   Title Pain to be no more than 4/10 in B shoulders    Status Achieved      PT SHORT TERM GOAL #3   Title Will have better understanding of postural mechanics during functional tasks    Status Achieved      PT SHORT TERM GOAL #4   Status On-going  PT Long Term Goals - 08/04/21 1055       PT LONG TERM GOAL #1   Title MMT to have improved by at least 1 grade in all weak groups    Status On-going      PT LONG TERM GOAL #2   Title B shoulder flexion AROM to be at least 100 degrees in sitting, ABD AROM to be at least 90 degrees in sitting    Status On-going      PT LONG TERM GOAL #3   Title Will be able to wash hair and perform functional hygiene without increased pain and minimal compensation patterns    Status On-going      PT LONG TERM GOAL #4   Title Will be about to use BUEs to cook a meal without increased pain or difficulty    Status On-going                   Plan - 08/11/21 1143     Clinical Impression Statement Pt has progressed increasing her AROM in both UE. Despite this increase pt UE AROM is still limited and weak. Pt has good PROM in all directions but she does have some pain with this R>L. Some compensation noted with seated rows and extensions.    Personal Factors and Comorbidities Comorbidity 3+    Comorbidities ESRD, HTN, Lupus    Examination-Activity Limitations Bathing;Reach Overhead;Caring for Others;Sleep;Carry;Hygiene/Grooming;Lift    Examination-Participation  Restrictions Cleaning;Community Activity;Driving    Stability/Clinical Decision Making Evolving/Moderate complexity    Rehab Potential Good    PT Frequency 2x / week    PT Treatment/Interventions ADLs/Self Care Home Management;Electrical Stimulation;Moist Heat;Therapeutic exercise;Therapeutic activities;Functional mobility training;Cryotherapy;Ultrasound;Iontophoresis 4mg /ml Dexamethasone;Patient/family education;Manual techniques;Passive range of motion;Dry needling;Energy conservation;Taping;Vasopneumatic Device    PT Next Visit Plan Bilateral shoulder ROM and strenght- caution with R shoulder until MD can clarify what's happening structurally             Patient will benefit from skilled therapeutic intervention in order to improve the following deficits and impairments:  Decreased range of motion, Increased fascial restricitons, Increased muscle spasms, Impaired UE functional use, Decreased activity tolerance, Pain, Hypomobility, Impaired flexibility, Improper body mechanics, Decreased mobility, Decreased strength, Postural dysfunction  Visit Diagnosis: Muscle weakness (generalized)  Stiffness of right shoulder, not elsewhere classified  Stiffness of left shoulder, not elsewhere classified     Problem List Patient Active Problem List   Diagnosis Date Noted   Cellulitis of chest wall    Acute renal failure (HCC) 01/26/2017   Exacerbation of systemic lupus erythematosus ([place]) 01/26/2017   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 [place], [name],  [address] Phone: [phone]   Fax:  [phone]  Name: Anne Robinson MRN: [id number] Date of Birth: [birth year]

## 2021-08-16 ENCOUNTER — Ambulatory Visit: Payer: Medicare Other

## 2021-08-16 NOTE — Therapy (Incomplete)
[place]. [place], [name], [address] Phone: [phone]   Fax:  [phone]  Physical Therapy Treatment  Patient Details  Name: Anne Robinson MRN: [id number] Date of Birth: 08-30-1990 Referring Provider (PT): Marita Kansas   Encounter Date: 08/16/2021     Past Medical History:  Diagnosis Date   HTN (hypertension) 05/15/2016   Lupus ([place])    Lupus nephritis ([place])    Pseudotumor cerebri    Renal disorder    Thyroid disease     Past Surgical History:  Procedure Laterality Date   INSERTION OF DIALYSIS CATHETER     RENAL BIOPSY     SHOULDER SURGERY      There were no vitals filed for this visit.   Subjective Assessment - 08/11/21 1101     Subjective Got a headache today, R shoulder is still sore    Currently in Pain? Yes    Pain Score 4     Pain Location Shoulder    Pain Orientation Right               Subjective   Tx PROM all directions  Supine AAROM 20 reps ER/IR 2#  Extension and rows on power tower 5# and 15#  OHP w/yellow ball  Diagonals                                  PT Short Term Goals - 08/04/21 1056       PT SHORT TERM GOAL #1   Title Will be compliant with approrpriate progressive HEP    Status Achieved      PT SHORT TERM GOAL #2   Title Pain to be no more than 4/10 in B shoulders    Status Achieved      PT SHORT TERM GOAL #3   Title Will have better understanding of postural mechanics during functional tasks    Status Achieved      PT SHORT TERM GOAL #4   Status On-going               PT Long Term Goals - 08/04/21 1055       PT LONG TERM GOAL #1   Title MMT to have improved by at least 1 grade in all weak groups    Status On-going      PT LONG TERM GOAL #2   Title B shoulder flexion AROM to be at least 100 degrees in sitting, ABD AROM to be at least 90 degrees in sitting    Status On-going      PT LONG  TERM GOAL #3   Title Will be able to wash hair and perform functional hygiene without increased pain and minimal compensation patterns    Status On-going      PT LONG TERM GOAL #4   Title Will be about to use BUEs to cook a meal without increased pain or difficulty    Status On-going                   Plan - 08/11/21 1143     Clinical Impression Statement Pt has progressed increasing her AROM in both UE. Despite this increase pt UE AROM is still limited and weak. Pt has good PROM in all directions but she does have some pain with this R>L. Some compensation noted with seated rows and extensions.    Personal Factors and  Comorbidities Comorbidity 3+    Comorbidities ESRD, HTN, Lupus    Examination-Activity Limitations Bathing;Reach Overhead;Caring for Others;Sleep;Carry;Hygiene/Grooming;Lift    Examination-Participation Restrictions Cleaning;Community Activity;Driving    Stability/Clinical Decision Making Evolving/Moderate complexity    Rehab Potential Good    PT Frequency 2x / week    PT Treatment/Interventions ADLs/Self Care Home Management;Electrical Stimulation;Moist Heat;Therapeutic exercise;Therapeutic activities;Functional mobility training;Cryotherapy;Ultrasound;Iontophoresis 4mg /ml Dexamethasone;Patient/family education;Manual techniques;Passive range of motion;Dry needling;Energy conservation;Taping;Vasopneumatic Device    PT Next Visit Plan Bilateral shoulder ROM and strenght- caution with R shoulder until MD can clarify what's happening structurally             Patient will benefit from skilled therapeutic intervention in order to improve the following deficits and impairments:     Visit Diagnosis: No diagnosis found.     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 [place], [name], [address] Phone: [phone]   Fax:  [phone]  Name: Anne Robinson MRN: [id number] Date of Birth: [birth year]

## 2021-08-18 ENCOUNTER — Ambulatory Visit: Payer: Medicare Other | Admitting: Physical Therapy

## 2021-08-18 ENCOUNTER — Encounter: Payer: Self-pay | Admitting: Physical Therapy

## 2021-08-18 DIAGNOSIS — R29898 Other symptoms and signs involving the musculoskeletal system: Secondary | ICD-10-CM

## 2021-08-18 DIAGNOSIS — M25611 Stiffness of right shoulder, not elsewhere classified: Secondary | ICD-10-CM

## 2021-08-18 DIAGNOSIS — M25612 Stiffness of left shoulder, not elsewhere classified: Secondary | ICD-10-CM

## 2021-08-18 DIAGNOSIS — M6281 Muscle weakness (generalized): Secondary | ICD-10-CM

## 2021-08-18 NOTE — Therapy (Signed)
[place]. [place], [name], [address] Phone: [phone]   Fax:  [phone]  Physical Therapy Treatment  Patient Details  Name: Anne Robinson MRN: [id number] Date of Birth: 17-Mar-1990 Referring Provider (PT): Marita Kansas   Encounter Date: 08/18/2021   PT End of Session - 08/18/21 1158     Visit Number 14    Number of Visits 20    Date for PT Re-Evaluation 09/01/21    Authorization Type MCR and UHC    Authorization Time Period 06/23/21 to 09/01/21    Progress Note Due on Visit 20    PT Start Time 1103    PT Stop Time 1141    PT Time Calculation (min) 38 min    Activity Tolerance Patient tolerated treatment well    Behavior During Therapy [facility] for tasks assessed/performed             Past Medical History:  Diagnosis Date   HTN (hypertension) 05/15/2016   Lupus ([place])    Lupus nephritis ([place])    Pseudotumor cerebri    Renal disorder    Thyroid disease     Past Surgical History:  Procedure Laterality Date   INSERTION OF DIALYSIS CATHETER     RENAL BIOPSY     SHOULDER SURGERY      There were no vitals filed for this visit.   Subjective Assessment - 08/18/21 1110     Subjective Doing OK, not bad and not terrible. Would like to work on R shoulder some more today    Pertinent History ESRD. Lupus, HTN    Patient Stated Goals get as much ROM in shoulders as possible, be more functional    Currently in Pain? Yes    Pain Score 4     Pain Location Shoulder    Pain Orientation Right;Left    Pain Descriptors / Indicators Aching;Sharp    Pain Type Chronic pain                               [facility] Adult PT Treatment/Exercise - 08/18/21 0001       Shoulder Exercises: Supine   Flexion Strengthening;Right;10 reps   min-modA   ABduction Strengthening;Right;10 reps   light MinA     Shoulder Exercises: ROM/Strengthening   UBE (Upper Arm Bike) L2 x3 min each forward/backwards       Modalities   Modalities Iontophoresis      Iontophoresis   Type of Iontophoresis Dexamethasone    Location anterior R shoulder    Dose 1.36mL    Time 4 hour patch      Manual Therapy   Manual Therapy Joint mobilization;Soft tissue mobilization    Soft tissue mobilization R anterior shoulder STM    Passive ROM R shoulder GH grade II mobs mixed with PROM                     PT Education - 08/18/21 1158     Education Details education about ionto and precations, removal time    Person(s) Educated Patient    Methods Explanation    Comprehension Verbalized understanding              PT Short Term Goals - 08/04/21 1056       PT SHORT TERM GOAL #1   Title Will be compliant with approrpriate progressive HEP    Status Achieved  PT SHORT TERM GOAL #2   Title Pain to be no more than 4/10 in B shoulders    Status Achieved      PT SHORT TERM GOAL #3   Title Will have better understanding of postural mechanics during functional tasks    Status Achieved      PT SHORT TERM GOAL #4   Status On-going               PT Long Term Goals - 08/04/21 1055       PT LONG TERM GOAL #1   Title MMT to have improved by at least 1 grade in all weak groups    Status On-going      PT LONG TERM GOAL #2   Title B shoulder flexion AROM to be at least 100 degrees in sitting, ABD AROM to be at least 90 degrees in sitting    Status On-going      PT LONG TERM GOAL #3   Title Will be able to wash hair and perform functional hygiene without increased pain and minimal compensation patterns    Status On-going      PT LONG TERM GOAL #4   Title Will be about to use BUEs to cook a meal without increased pain or difficulty    Status On-going                   Plan - 08/18/21 1158     Clinical Impression Statement Bri arrives doing OK, still having a lot of issues with right shoulder and wanted to focus on this today. Spent most of the session on manual  interventions as well as increased focus on cautious R shoulder strengthening to tolerance today, also tried ionto patch to anterior R shoulder as this area continues to be tender and painful for her. Will continue efforts.    Personal Factors and Comorbidities Comorbidity 3+    Comorbidities ESRD, HTN, Lupus    Examination-Activity Limitations Bathing;Reach Overhead;Caring for Others;Sleep;Carry;Hygiene/Grooming;Lift    Examination-Participation Restrictions Cleaning;Community Activity;Driving    Stability/Clinical Decision Making Evolving/Moderate complexity    Clinical Decision Making Moderate    Rehab Potential Good    PT Frequency 2x / week    PT Duration Other (comment)    PT Treatment/Interventions ADLs/Self Care Home Management;Electrical Stimulation;Moist Heat;Therapeutic exercise;Therapeutic activities;Functional mobility training;Cryotherapy;Ultrasound;Iontophoresis 4mg /ml Dexamethasone;Patient/family education;Manual techniques;Passive range of motion;Dry needling;Energy conservation;Taping;Vasopneumatic Device    PT Next Visit Plan Bilateral shoulder ROM and strenght- caution with R shoulder until MD can clarify what's happening structurally; How was ionto    PT Home Exercise Plan scap retraction, L shoulder flexion, L shoulder ABD in supine    Consulted and Agree with Plan of Care Patient             Patient will benefit from skilled therapeutic intervention in order to improve the following deficits and impairments:  Decreased range of motion, Increased fascial restricitons, Increased muscle spasms, Impaired UE functional use, Decreased activity tolerance, Pain, Hypomobility, Impaired flexibility, Improper body mechanics, Decreased mobility, Decreased strength, Postural dysfunction  Visit Diagnosis: Muscle weakness (generalized)  Stiffness of right shoulder, not elsewhere classified  Stiffness of left shoulder, not elsewhere classified  Other symptoms and signs involving  the musculoskeletal system     Problem List Patient Active Problem List   Diagnosis Date Noted   Cellulitis of chest wall    Acute renal failure ([place]) 01/26/2017   Exacerbation of systemic lupus erythematosus ([place]) 01/26/2017   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 [place], [name], [address] Phone: [phone]   Fax:  [phone]  Name: SVARA TWYMAN MRN: [id number] Date of Birth: 05/17/1990

## 2021-08-19 NOTE — Therapy (Signed)
[place]. [place], [name], [address] Phone: [phone]   Fax:  [phone]  Physical Therapy Treatment  Patient Details  Name: Anne Robinson MRN: [id number] Date of Birth: 11/12/1990 Referring Provider (PT): Marita Kansas   Encounter Date: 08/20/2021   PT End of Session - 08/20/21 1019     Visit Number 15    Number of Visits 20    Date for PT Re-Evaluation 09/01/21    Authorization Type MCR and UHC    Authorization Time Period 06/23/21 to 09/01/21    Progress Note Due on Visit 20    PT Start Time 1017    PT Stop Time 1056    PT Time Calculation (min) 39 min    Activity Tolerance Patient tolerated treatment well    Behavior During Therapy [facility] for tasks assessed/performed             Past Medical History:  Diagnosis Date   HTN (hypertension) 05/15/2016   Lupus ([place])    Lupus nephritis ([place])    Pseudotumor cerebri    Renal disorder    Thyroid disease     Past Surgical History:  Procedure Laterality Date   INSERTION OF DIALYSIS CATHETER     RENAL BIOPSY     SHOULDER SURGERY      There were no vitals filed for this visit.   Subjective Assessment - 08/20/21 1058     Subjective Shoulders feel sore more on the R than the L, it may be from sleeping on it the wrong way.  4/10 on pain scale.    Pertinent History ESRD. Lupus, HTN    Patient Stated Goals get as much ROM in shoulders as possible, be more functional    Currently in Pain? Yes    Pain Score 4     Pain Location Shoulder    Pain Orientation Right    Pain Descriptors / Indicators Aching;Sore    Pain Onset More than a month ago            Shoulders feel sore more on the R than the L, it may be from sleeping on it the wrong way.     Treatment 08/19/21 Arm bike L2, 6 mins STM to R biceps, deltoids, and pec PROM into flexion/abd/ER  Shoulder flexion supine 2# 2x10 Horizontal abd band w/yellow band elbows bent x8 Seated rows  15#                   PT Short Term Goals - 08/04/21 1056       PT SHORT TERM GOAL #1   Title Will be compliant with approrpriate progressive HEP    Status Achieved      PT SHORT TERM GOAL #2   Title Pain to be no more than 4/10 in B shoulders    Status Achieved      PT SHORT TERM GOAL #3   Title Will have better understanding of postural mechanics during functional tasks    Status Achieved      PT SHORT TERM GOAL #4   Status On-going               PT Long Term Goals - 08/04/21 1055       PT LONG TERM GOAL #1   Title MMT to have improved by at least 1 grade in all weak groups    Status On-going      PT LONG TERM GOAL #2  Title B shoulder flexion AROM to be at least 100 degrees in sitting, ABD AROM to be at least 90 degrees in sitting    Status On-going      PT LONG TERM GOAL #3   Title Will be able to wash hair and perform functional hygiene without increased pain and minimal compensation patterns    Status On-going      PT LONG TERM GOAL #4   Title Will be about to use BUEs to cook a meal without increased pain or difficulty    Status On-going                   Plan - 08/20/21 1059     Clinical Impression Statement She was given a dexa patch last visit and states she does not know if it helped or not but STM from last visit helped out a lot. She is able to get close to full ranges into abd and ER with passive range of motion, still has the most difficulty and pain into flexion but can get >110d. Actively she still demonstrates difficulty with all shoulder movements over 90d of flexion. Continued to work on shoulder strengthening and ROM activities today.    Personal Factors and Comorbidities Comorbidity 3+    Comorbidities ESRD, HTN, Lupus    Examination-Activity Limitations Bathing;Reach Overhead;Caring for Others;Sleep;Carry;Hygiene/Grooming;Lift    Examination-Participation Restrictions Cleaning;Community Activity;Driving     Stability/Clinical Decision Making Evolving/Moderate complexity    Rehab Potential Good    PT Frequency 2x / week    PT Duration Other (comment)    PT Treatment/Interventions ADLs/Self Care Home Management;Electrical Stimulation;Moist Heat;Therapeutic exercise;Therapeutic activities;Functional mobility training;Cryotherapy;Ultrasound;Iontophoresis 4mg /ml Dexamethasone;Patient/family education;Manual techniques;Passive range of motion;Dry needling;Energy conservation;Taping;Vasopneumatic Device    PT Next Visit Plan Bilateral shoulder ROM and strength    PT Home Exercise Plan scap retraction, L shoulder flexion, L shoulder ABD in supine    Consulted and Agree with Plan of Care Patient             Patient will benefit from skilled therapeutic intervention in order to improve the following deficits and impairments:  Decreased range of motion, Increased fascial restricitons, Increased muscle spasms, Impaired UE functional use, Decreased activity tolerance, Pain, Hypomobility, Impaired flexibility, Improper body mechanics, Decreased mobility, Decreased strength, Postural dysfunction  Visit Diagnosis: Muscle weakness (generalized)  Stiffness of right shoulder, not elsewhere classified  Stiffness of left shoulder, not elsewhere classified  Other symptoms and signs involving the musculoskeletal system     Problem List Patient Active Problem List   Diagnosis Date Noted   Cellulitis of chest wall    Acute renal failure ([facility]) 01/26/2017   Exacerbation of systemic lupus erythematosus ([place]) 01/26/2017   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 [place], [name], [address] Phone: [phone]   Fax:  [phone]  Name: Anne Robinson MRN: [id number] Date of Birth: 1990/12/13

## 2021-08-20 ENCOUNTER — Ambulatory Visit: Payer: Medicare Other

## 2021-08-20 DIAGNOSIS — R29898 Other symptoms and signs involving the musculoskeletal system: Secondary | ICD-10-CM

## 2021-08-20 DIAGNOSIS — M25612 Stiffness of left shoulder, not elsewhere classified: Secondary | ICD-10-CM

## 2021-08-20 DIAGNOSIS — M25611 Stiffness of right shoulder, not elsewhere classified: Secondary | ICD-10-CM

## 2021-08-20 DIAGNOSIS — M6281 Muscle weakness (generalized): Secondary | ICD-10-CM

## 2021-08-23 ENCOUNTER — Ambulatory Visit: Payer: Medicare Other | Admitting: Physical Therapy

## 2021-08-25 ENCOUNTER — Ambulatory Visit: Payer: Medicare Other | Admitting: Physical Therapy

## 2021-08-27 ENCOUNTER — Ambulatory Visit: Payer: Medicare Other | Admitting: Physical Therapy

## 2021-08-30 ENCOUNTER — Ambulatory Visit: Payer: Medicare Other

## 2021-09-01 ENCOUNTER — Ambulatory Visit: Payer: Medicare Other

## 2021-09-02 ENCOUNTER — Ambulatory Visit: Payer: Medicare Other | Admitting: Physical Therapy

## 2021-09-02 ENCOUNTER — Encounter: Payer: Self-pay | Admitting: Physical Therapy

## 2021-09-02 DIAGNOSIS — M25612 Stiffness of left shoulder, not elsewhere classified: Secondary | ICD-10-CM | POA: Diagnosis not present

## 2021-09-02 DIAGNOSIS — M25611 Stiffness of right shoulder, not elsewhere classified: Secondary | ICD-10-CM

## 2021-09-02 DIAGNOSIS — M6281 Muscle weakness (generalized): Secondary | ICD-10-CM

## 2021-09-02 NOTE — Therapy (Signed)
[facility] [address] [place], [place], [address] Phone: [phone]   Fax:  [phone]  Physical Therapy Treatment  Patient Details  Name: Anne Robinson MRN: [id number] Date of Birth: 08/28/1990 Referring Provider (PT): Weyman Croon   Encounter Date: 09/02/2021   PT End of Session - 09/02/21 1058     Visit Number 16    Date for PT Re-Evaluation 11/02/21    PT Start Time 1015    PT Stop Time 1058    PT Time Calculation (min) 43 min    Activity Tolerance Patient tolerated treatment well    Behavior During Therapy [facility] for tasks assessed/performed             Past Medical History:  Diagnosis Date   HTN (hypertension) 05/15/2016   Lupus ([facility])    Lupus nephritis ([facility])    Pseudotumor cerebri    Renal disorder    Thyroid disease     Past Surgical History:  Procedure Laterality Date   INSERTION OF DIALYSIS CATHETER     RENAL BIOPSY     SHOULDER SURGERY      There were no vitals filed for this visit.   Subjective Assessment - 09/02/21 1019     Subjective "Its ok" Still has some soreness when she wakes up    Currently in Pain? Yes    Pain Score 3     Pain Location Shoulder    Pain Orientation Right                [facility] PT Assessment - 09/02/21 0001       AROM   Right Shoulder Flexion 60 Degrees    Right Shoulder ABduction 69 Degrees    Left Shoulder Flexion 85 Degrees    Left Shoulder ABduction 51 Degrees      Strength   Right Shoulder Flexion 3/5    Right Shoulder ABduction 3/5    Left Shoulder Flexion 4-/5    Left Shoulder ABduction 3+/5                           [facility] Adult PT Treatment/Exercise - 09/02/21 0001       Shoulder Exercises: Standing   Extension Strengthening;Both;Theraband;10 reps   x3   Theraband Level (Shoulder Extension) Level 2 (Red)    Other Standing Exercises Biceps curls 3lb 2x12      Shoulder Exercises: ROM/Strengthening   UBE (Upper Arm  Bike) L1 x3 min each forward/backwards      Shoulder Exercises: Power Pensions consultant Rows 25lb 2x10      Manual Therapy   Manual Therapy Joint mobilization;Soft tissue mobilization    Soft tissue mobilization R anterior shoulder STM    Passive ROM R shoulder GH grade II mobs mixed with PROM                       PT Short Term Goals - 09/02/21 1030       PT SHORT TERM GOAL #2   Title Pain to be no more than 4/10 in B shoulders               PT Long Term Goals - 09/02/21 1030       PT LONG TERM GOAL #1   Title MMT to have improved by at least 1 grade in all weak groups    Status Partially Met  PT LONG TERM GOAL #2   Title B shoulder flexion AROM to be at least 100 degrees in sitting, ABD AROM to be at least 90 degrees in sitting    Status On-going      PT LONG TERM GOAL #3   Title Will be able to wash hair and perform functional hygiene without increased pain and minimal compensation patterns    Status Partially Met      PT LONG TERM GOAL #4   Title Will be about to use BUEs to cook a meal without increased pain or difficulty    Status Partially Met                   Plan - 09/02/21 1058     Clinical Impression Statement Pt enters feeling well. Overall UE ROM remains limited but his has improved some. She has also improved her UE strength with her available ROM. Increased resistance and or reps  tolerated with seated rows and shoulder extensions. Less grinding sensation noted with R shoulder during PROM. Pt will benefit from skilled PT interventions.    Personal Factors and Comorbidities Comorbidity 3+    Comorbidities ESRD, HTN, Lupus    Examination-Activity Limitations Bathing;Reach Overhead;Caring for Others;Sleep;Carry;Hygiene/Grooming;Lift    Examination-Participation Restrictions Cleaning;Community Activity;Driving    Stability/Clinical Decision Making Evolving/Moderate complexity    Rehab Potential Good    PT  Frequency 2x / week    PT Treatment/Interventions ADLs/Self Care Home Management;Electrical Stimulation;Moist Heat;Therapeutic exercise;Therapeutic activities;Functional mobility training;Cryotherapy;Ultrasound;Iontophoresis 12m/ml Dexamethasone;Patient/family education;Manual techniques;Passive range of motion;Dry needling;Energy conservation;Taping;Vasopneumatic Device    PT Next Visit Plan Bilateral shoulder ROM and strength             Patient will benefit from skilled therapeutic intervention in order to improve the following deficits and impairments:  Decreased range of motion, Increased fascial restricitons, Increased muscle spasms, Impaired UE functional use, Decreased activity tolerance, Pain, Hypomobility, Impaired flexibility, Improper body mechanics, Decreased mobility, Decreased strength, Postural dysfunction  Visit Diagnosis: Muscle weakness (generalized) - Plan: PT plan of care cert/re-cert  Stiffness of right shoulder, not elsewhere classified - Plan: PT plan of care cert/re-cert  Stiffness of left shoulder, not elsewhere classified - Plan: PT plan of care cert/re-cert     Problem List Patient Active Problem List   Diagnosis Date Noted   Cellulitis of chest wall    Acute renal failure ([facility]) 01/26/2017   Ex[place]ation of systemic [place] erythematosus ([place] 01/26/2017   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

## 2021-09-14 ENCOUNTER — Other Ambulatory Visit: Payer: Self-pay

## 2021-09-14 ENCOUNTER — Observation Stay (HOSPITAL_BASED_OUTPATIENT_CLINIC_OR_DEPARTMENT_OTHER)
Admission: EM | Admit: 2021-09-14 | Discharge: 2021-09-16 | Disposition: A | Discharge status: Home / Self Care | Payer: Medicare Other | Attending: Family Medicine | Admitting: Family Medicine

## 2021-09-14 ENCOUNTER — Encounter (HOSPITAL_BASED_OUTPATIENT_CLINIC_OR_DEPARTMENT_OTHER): Payer: Self-pay | Admitting: Emergency Medicine

## 2021-09-14 ENCOUNTER — Encounter: Payer: Self-pay | Admitting: Physical Therapy

## 2021-09-14 ENCOUNTER — Emergency Department (HOSPITAL_BASED_OUTPATIENT_CLINIC_OR_DEPARTMENT_OTHER): Payer: Medicare Other

## 2021-09-14 ENCOUNTER — Ambulatory Visit: Payer: Medicare Other | Attending: Orthopedic Surgery | Admitting: Physical Therapy

## 2021-09-14 DIAGNOSIS — E039 Hypothyroidism, unspecified: Secondary | ICD-10-CM | POA: Diagnosis not present

## 2021-09-14 DIAGNOSIS — M25611 Stiffness of right shoulder, not elsewhere classified: Secondary | ICD-10-CM | POA: Diagnosis present

## 2021-09-14 DIAGNOSIS — N186 End stage renal disease: Secondary | ICD-10-CM | POA: Insufficient documentation

## 2021-09-14 DIAGNOSIS — M25612 Stiffness of left shoulder, not elsewhere classified: Secondary | ICD-10-CM | POA: Diagnosis present

## 2021-09-14 DIAGNOSIS — R262 Difficulty in walking, not elsewhere classified: Secondary | ICD-10-CM | POA: Insufficient documentation

## 2021-09-14 DIAGNOSIS — G459 Transient cerebral ischemic attack, unspecified: Secondary | ICD-10-CM | POA: Diagnosis not present

## 2021-09-14 DIAGNOSIS — D696 Thrombocytopenia, unspecified: Secondary | ICD-10-CM | POA: Diagnosis not present

## 2021-09-14 DIAGNOSIS — R29898 Other symptoms and signs involving the musculoskeletal system: Secondary | ICD-10-CM | POA: Diagnosis present

## 2021-09-14 DIAGNOSIS — R2681 Unsteadiness on feet: Secondary | ICD-10-CM | POA: Diagnosis present

## 2021-09-14 DIAGNOSIS — G932 Benign intracranial hypertension: Secondary | ICD-10-CM | POA: Diagnosis present

## 2021-09-14 DIAGNOSIS — Z20822 Contact with and (suspected) exposure to covid-19: Secondary | ICD-10-CM | POA: Diagnosis not present

## 2021-09-14 DIAGNOSIS — R29818 Other symptoms and signs involving the nervous system: Secondary | ICD-10-CM | POA: Insufficient documentation

## 2021-09-14 DIAGNOSIS — D631 Anemia in chronic kidney disease: Secondary | ICD-10-CM | POA: Insufficient documentation

## 2021-09-14 DIAGNOSIS — Z982 Presence of cerebrospinal fluid drainage device: Secondary | ICD-10-CM

## 2021-09-14 DIAGNOSIS — M6281 Muscle weakness (generalized): Secondary | ICD-10-CM | POA: Insufficient documentation

## 2021-09-14 DIAGNOSIS — Z79899 Other long term (current) drug therapy: Secondary | ICD-10-CM | POA: Diagnosis not present

## 2021-09-14 DIAGNOSIS — D84821 Adverse effect of glucocorticoids and synthetic analogues, initial encounter: Secondary | ICD-10-CM | POA: Diagnosis present

## 2021-09-14 DIAGNOSIS — Z7901 Long term (current) use of anticoagulants: Secondary | ICD-10-CM | POA: Diagnosis not present

## 2021-09-14 DIAGNOSIS — I120 Hypertensive chronic kidney disease with stage 5 chronic kidney disease or end stage renal disease: Secondary | ICD-10-CM | POA: Diagnosis not present

## 2021-09-14 DIAGNOSIS — M329 Systemic lupus erythematosus, unspecified: Secondary | ICD-10-CM | POA: Diagnosis present

## 2021-09-14 DIAGNOSIS — R319 Hematuria, unspecified: Secondary | ICD-10-CM | POA: Diagnosis not present

## 2021-09-14 DIAGNOSIS — Z7952 Long term (current) use of systemic steroids: Secondary | ICD-10-CM | POA: Diagnosis present

## 2021-09-14 DIAGNOSIS — G43109 Migraine with aura, not intractable, without status migrainosus: Secondary | ICD-10-CM | POA: Diagnosis not present

## 2021-09-14 DIAGNOSIS — Z86718 Personal history of other venous thrombosis and embolism: Secondary | ICD-10-CM | POA: Diagnosis not present

## 2021-09-14 DIAGNOSIS — I10 Essential (primary) hypertension: Secondary | ICD-10-CM | POA: Diagnosis present

## 2021-09-14 DIAGNOSIS — Z992 Dependence on renal dialysis: Secondary | ICD-10-CM | POA: Diagnosis not present

## 2021-09-14 DIAGNOSIS — Z7982 Long term (current) use of aspirin: Secondary | ICD-10-CM | POA: Diagnosis not present

## 2021-09-14 DIAGNOSIS — R200 Anesthesia of skin: Secondary | ICD-10-CM | POA: Diagnosis present

## 2021-09-14 LAB — COMPREHENSIVE METABOLIC PANEL
ALT: 17 U/L (ref 0–44)
AST: 23 U/L (ref 15–41)
Albumin: 3.7 g/dL (ref 3.5–5.0)
Alkaline Phosphatase: 90 U/L (ref 38–126)
Anion gap: 11 (ref 5–15)
BUN: 30 mg/dL — ABNORMAL HIGH (ref 6–20)
CO2: 26 mmol/L (ref 22–32)
Calcium: 8.7 mg/dL — ABNORMAL LOW (ref 8.9–10.3)
Chloride: 103 mmol/L (ref 98–111)
Creatinine, Ser: 8.9 mg/dL — ABNORMAL HIGH (ref 0.44–1.00)
GFR, Estimated: 6 mL/min — ABNORMAL LOW (ref >60)
Glucose, Bld: 95 mg/dL (ref 70–99)
Potassium: 5.1 mmol/L (ref 3.5–5.1)
Sodium: 140 mmol/L (ref 135–145)
Total Bilirubin: 0.6 mg/dL (ref 0.3–1.2)
Total Protein: 7.5 g/dL (ref 6.5–8.1)

## 2021-09-14 LAB — ETHANOL: Alcohol, Ethyl (B): <10 mg/dL (ref <10)

## 2021-09-14 LAB — CBC
HCT: 34 % — ABNORMAL LOW (ref 36.0–46.0)
Hemoglobin: 10.2 g/dL — ABNORMAL LOW (ref 12.0–15.0)
MCH: 30.6 pg (ref 26.0–34.0)
MCHC: 30 g/dL (ref 30.0–36.0)
MCV: 102.1 fL — ABNORMAL HIGH (ref 80.0–100.0)
Platelets: 132 10*3/uL — ABNORMAL LOW (ref 150–400)
RBC: 3.33 MIL/uL — ABNORMAL LOW (ref 3.87–5.11)
RDW: 20.9 % — ABNORMAL HIGH (ref 11.5–15.5)
WBC: 6.5 10*3/uL (ref 4.0–10.5)
nRBC: 0.9 % — ABNORMAL HIGH (ref 0.0–0.2)

## 2021-09-14 LAB — RESP PANEL BY RT-PCR (FLU A&B, COVID) ARPGX2
Influenza A by PCR: NEGATIVE
Influenza B by PCR: NEGATIVE
SARS Coronavirus 2 by RT PCR: NEGATIVE

## 2021-09-14 LAB — DIFFERENTIAL
Abs Immature Granulocytes: 0.11 10*3/uL — ABNORMAL HIGH (ref 0.00–0.07)
Basophils Absolute: 0 10*3/uL (ref 0.0–0.1)
Basophils Relative: 0 %
Eosinophils Absolute: 0.3 10*3/uL (ref 0.0–0.5)
Eosinophils Relative: 5 %
Immature Granulocytes: 2 %
Lymphocytes Relative: 14 %
Lymphs Abs: 0.9 10*3/uL (ref 0.7–4.0)
Monocytes Absolute: 0.4 10*3/uL (ref 0.1–1.0)
Monocytes Relative: 7 %
Neutro Abs: 4.8 10*3/uL (ref 1.7–7.7)
Neutrophils Relative %: 72 %

## 2021-09-14 LAB — PROTIME-INR
INR: 1.2 (ref 0.8–1.2)
Prothrombin Time: 15 seconds (ref 11.4–15.2)

## 2021-09-14 LAB — CBG MONITORING, ED: Glucose-Capillary: 91 mg/dL (ref 70–99)

## 2021-09-14 LAB — APTT: aPTT: 34 seconds (ref 24–36)

## 2021-09-14 MED ORDER — ASPIRIN 81 MG PO CHEW
324.0000 mg | CHEWABLE_TABLET | Freq: Once | ORAL | Status: AC
Start: 2021-09-14 — End: 2021-09-14
  Administered 2021-09-14: 324 mg via ORAL
  Filled 2021-09-14: qty 4

## 2021-09-14 MED ORDER — PROCHLORPERAZINE EDISYLATE 10 MG/2ML IJ SOLN
10.0000 mg | Freq: Once | INTRAMUSCULAR | Status: AC
  Administered 2021-09-14: 10 mg via INTRAVENOUS
  Filled 2021-09-14: qty 2

## 2021-09-14 NOTE — Consult Note (Signed)
[facility] TeleNeurology Consult Services    Date of Service:  09/14/2021     Metrics: Last Known Well: 4128 Patient is not a candidate for thrombolytic: Low NIHSS score, rapidly improving symptoms, high likelihood of an etiology other than stroke for presentation. Overall risks of TNK outweigh potential benefits.   Location of the provider: [facility]  Location of the patient: [facility] Pre-Morbid Modified Rankin Scale: 0  This consult was provided via telemedicine with 2-way video and audio communication. The patient/family was informed that care would be provided in this way and agreed to receive care in this manner.   ED Physician notified of diagnostic impression and management plan following completion of neurological exam.    Assessment: 31 year old female with lupus on predisone and mycophenolate, lupus nephritis with ESRD on HD MWF, pseudotumor cerebri s/p VP shunt placement and migraine headaches who presents with 10/10 bifrontal throbbing headache in conjunction with right facial sensory numbness, slurred speech and swaying dizziness.Also endorses mild right hand and arm weakness manifesting with difficulty buckling her seatbelt with her right hand. LKN was 1730 - Exam reveals mild right arm drift and right hand sensory deficit. NIHSS 2.  - CT head: Stable right frontal ventriculostomy catheter. No acute intracranial abnormality or significant interval change. - EKG: Normal sinus rhythm; Cannot rule out Anterior infarct, age undetermined - ESRD. Estimated GFR today is 6.  - The patient is not a candidate for thrombolytic: Low NIHSS score, rapidly improving symptoms, high likelihood of an etiology other than stroke for presentation. Overall risks of TNK outweigh potential benefits.  - Exam findings not consistent with LVO.  - Most likely component of the DDx is complicated migraine. Lower on the DDx but also possible is acute lacunar  stroke or an acute lupus cerebritis lesion. Of note, she has a history of lupus but no prior diagnosis of lupus cerebritis.    Recommendations: - Will need to be transferred to [facility] for MRI brain and TIA/stroke work up to include MRA head, carotid ultrasound and TTE. Her VP shunt is adjusted by a magnet and therefore staff with training in management of VP shunt will be needed for MRI provided that her shunt is MRI compatible. If unable to perform MRI, will need repeat CT head in 72 hours from symptom onset to assess for possible lacunar infarction.  - Dialysis is on MWF schedule. Will need to be dialyzed while at [facility] tomorrow.   - Migraine cocktail per EDP      ------------------------------------------------------------------------------   History of Present Illness: Anne Robinson is a 31 year old female with a PMHx of lupus, lupus nephritis, ESRD on HD MWF, pseudotumor cerebri s/p VP shunt placement, migraines, HTN and thyroid disease who presents to the [facility] ED with a chief complaint of 10/10 bifrontal throbbing headache in conjunction with right facial sensory numbness, slurred speech and swaying dizziness.Also endorses mild right hand and arm weakness manifesting with difficulty buckling her seatbelt with her right hand. LKN was 1730, at which she had sudden onset of the above symptoms. On arrival to the ED her BP was 170/100, HR 96 and CBG 91. She states that she had a similar, less severe episode during dialysis 2 weeks ago that spontaneously resolved.   Code Stroke was called in the [facility] ED.   Her slurred speech had resolved at the time of neurological evaluation at the [facility] ED today. Weakness had also resolved except for subjective sensation or right arm  heaviness. Denies lower extremity weakness. No facial droop, dysphasia or dysarthria observed by ED staff. She denies vision changes. Her headache continues with 10/10 pain. She states that it feels like one of her migraines.   She is  unable to take Diamox due to her renal disease.      Past Medical History: Past Medical History:  Diagnosis Date   HTN (hypertension) 05/15/2016   Lupus ([place])    Lupus nephritis ([place])    Pseudotumor cerebri    Renal disorder    Thyroid disease       Past Surgical History: Past Surgical History:  Procedure Laterality Date   INSERTION OF DIALYSIS CATHETER     RENAL BIOPSY     SHOULDER SURGERY      - Prior VP shunt placement    Medications:  No current facility-administered medications on file prior to encounter.   Current Outpatient Medications on File Prior to Encounter  Medication Sig Dispense Refill   amLODipine (NORVASC) 10 MG tablet Take by mouth.     calcitRIOL (ROCALTROL) 0.25 MCG capsule      calcium carbonate (OS-CAL) 1250 (500 Ca) MG chewable tablet Chew by mouth.     diazepam (VALIUM) 5 MG tablet Take 1 tablet at bedtime as needed for sleep. 10 tablet 0   EPINEPHrine 0.3 mg/0.3 mL IJ SOAJ injection Inject into the muscle.     famotidine (PEPCID) 20 MG tablet Take by mouth.     fluconazole (DIFLUCAN) 150 MG tablet Take 150 mg daily as needed by mouth (yeast infection).   0   fluticasone (FLONASE) 50 MCG/ACT nasal spray Place into the nose.     furosemide (LASIX) 20 MG tablet Take by mouth.     HYDROcodone-acetaminophen (NORCO/VICODIN) 5-325 MG tablet Take by mouth.     labetalol (NORMODYNE) 300 MG tablet Take 300 mg by mouth 3 (three) times daily.     labetalol (NORMODYNE) 300 MG tablet Take by mouth.     loratadine (CLARITIN) 10 MG tablet Take 10 mg by mouth daily as needed for allergies.      mycophenolate (CELLCEPT) 500 MG tablet      nebivolol (BYSTOLIC) 10 MG tablet      pantoprazole (PROTONIX) 40 MG tablet Take by mouth.     predniSONE (DELTASONE) 10 MG tablet Take by mouth.     predniSONE (DELTASONE) 20 MG tablet Take 2 tablets (40 mg total) by mouth daily with breakfast. 60 tablet 0   [DISCONTINUED] temazepam (RESTORIL) 15 MG capsule Take by mouth.       - Daily ASA      Social History: Drug Use: None.  No EtOH or tobacco use.    Family History:  No family history listed in Epic   ROS: As per HPI    Anticoagulant use:  None   Antiplatelet use: Daily ASA 81 mg   Examination:   BP (!) 154/106   Pulse 86   Temp (!) 97.5 F (36.4 C) (Oral)   Resp 20   Ht 4\' 11"  (1.499 m)   Wt 75.3 kg   LMP 09/10/2021 (Approximate)   SpO2 100%   BMI 33.53 kg/m     1A: Level of Consciousness - 0 1B: Ask Month and Age - 0 1C: Blink Eyes & Squeeze Hands - 0 2: Test Horizontal Extraocular Movements - 0 3: Test Visual Fields - 0 4: Test Facial Palsy (Use Grimace if Obtunded) - 0 5A: Test Left Arm Motor Drift - 0 5B:  Test Right Arm Motor Drift - 1 6A: Test Left Leg Motor Drift - 0 6B: Test Right Leg Motor Drift - 0 7: Test Limb Ataxia (FNF/Heel-Shin) - 0 8: Test Sensation -  1 9: Test Language/Aphasia - 0 10: Test Dysarthria - Severe Dysarthria: 0 11: Test Extinction/Inattention - Extinction to bilateral simultaneous stimulation 0   NIHSS Score: 2     Patient/Family was informed the Neurology Consult would occur via TeleHealth consult by way of interactive audio and video telecommunications and consented to receiving care in this manner.   Patient is being evaluated for possible acute neurologic impairment and high pretest probability of imminent or life-threatening deterioration. I spent total of 40 minutes providing care to this patient, including time for face to face visit via telemedicine, review of medical records, imaging studies and discussion of findings with providers, the patient and/or family.   Electronically signed: Dr. Kerney Elbe

## 2021-09-14 NOTE — ED Notes (Signed)
Patient being transported to CT as a code stroke

## 2021-09-14 NOTE — Progress Notes (Signed)
Code Stroke activated @ 9826.  LKWT 4158.  To CT @ 1901 with return @ 1910.  Dr. Cheral Marker on camera @ [phone]. mRS 0.  NIHSS 2.  No TNK per [place].

## 2021-09-14 NOTE — Therapy (Signed)
[place] [place], [name], [address] Phone: [phone]   Fax:  [phone]  Physical Therapy Treatment  Patient Details  Name: Anne Robinson MRN: [id number] Date of Birth: Oct 26, 1990 Referring Provider (PT): Marita Kansas   Encounter Date: 09/14/2021   PT End of Session - 09/14/21 1556     Visit Number 17    Date for PT Re-Evaluation 11/02/21    PT Start Time 1515    PT Stop Time 1600    PT Time Calculation (min) 45 min    Activity Tolerance Patient tolerated treatment well    Behavior During Therapy [facility] for tasks assessed/performed             Past Medical History:  Diagnosis Date   HTN (hypertension) 05/15/2016   Lupus ([place])    Lupus nephritis ([place])    Pseudotumor cerebri    Renal disorder    Thyroid disease     Past Surgical History:  Procedure Laterality Date   INSERTION OF DIALYSIS CATHETER     RENAL BIOPSY     SHOULDER SURGERY      There were no vitals filed for this visit.   Subjective Assessment - 09/14/21 1521     Subjective "Good    Currently in Pain? Yes    Pain Score 4     Pain Location Shoulder    Pain Orientation Right                               [facility] Adult PT Treatment/Exercise - 09/14/21 0001       Lumbar Exercises: Aerobic   Nustep L3 x 6 min      Shoulder Exercises: Standing   Row Strengthening;Both;20 reps;Theraband    Theraband Level (Shoulder Row) --   Level 5 black   Other Standing Exercises AAROM flex, Ext, IR 3lb WaTE x10    Other Standing Exercises Underhand reb ball tosses x10 each, red ball behind th back passes x 10 each      Shoulder Exercises: ROM/Strengthening   UBE (Upper Arm Bike) L1 x51mn each forward/backwards      Manual Therapy   Manual Therapy Joint mobilization;Soft tissue mobilization    Soft tissue mobilization R anterior shoulder STM    Passive ROM R shoulder GH grade II mobs mixed with PROM                        PT Short Term Goals - 09/02/21 1030       PT SHORT TERM GOAL #2   Title Pain to be no more than 4/10 in B shoulders               PT Long Term Goals - 09/02/21 1030       PT LONG TERM GOAL #1   Title MMT to have improved by at least 1 grade in all weak groups    Status Partially Met      PT LONG TERM GOAL #2   Title B shoulder flexion AROM to be at least 100 degrees in sitting, ABD AROM to be at least 90 degrees in sitting    Status On-going      PT LONG TERM GOAL #3   Title Will be able to wash hair and perform functional hygiene without increased pain and minimal compensation patterns    Status Partially Met  PT LONG TERM GOAL #4   Title Will be about to use BUEs to cook a meal without increased pain or difficulty    Status Partially Met                   Plan - 09/14/21 1557     Clinical Impression Statement Pt enters with reports of less shoulder pain overall. Her UE function remains limited due to decrease ROM and strength. She did really well overall today with interventions. Some postural compensation noted with standing rows and ext. Less pain overall with PROM.    Personal Factors and Comorbidities Comorbidity 3+    Comorbidities ESRD, HTN, Lupus    Examination-Activity Limitations Bathing;Reach Overhead;Caring for Others;Sleep;Carry;Hygiene/Grooming;Lift    Examination-Participation Restrictions Cleaning;Community Activity;Driving    Stability/Clinical Decision Making Evolving/Moderate complexity    Rehab Potential Good    PT Frequency 2x / week    PT Treatment/Interventions ADLs/Self Care Home Management;Electrical Stimulation;Moist Heat;Therapeutic exercise;Therapeutic activities;Functional mobility training;Cryotherapy;Ultrasound;Iontophoresis 60m/ml Dexamethasone;Patient/family education;Manual techniques;Passive range of motion;Dry needling;Energy conservation;Taping;Vasopneumatic Device    PT Next Visit Plan Bilateral  shoulder ROM and strength             Patient will benefit from skilled therapeutic intervention in order to improve the following deficits and impairments:  Decreased range of motion, Increased fascial restricitons, Increased muscle spasms, Impaired UE functional use, Decreased activity tolerance, Pain, Hypomobility, Impaired flexibility, Improper body mechanics, Decreased mobility, Decreased strength, Postural dysfunction  Visit Diagnosis: Muscle weakness (generalized)  Stiffness of right shoulder, not elsewhere classified  Stiffness of left shoulder, not elsewhere classified     Problem List Patient Active Problem List   Diagnosis Date Noted   Cellulitis of chest wall    Acute renal failure ([place] 01/26/2017   Exacerbation of systemic lupus erythematosus ([place] 01/26/2017   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

## 2021-09-14 NOTE — ED Provider Notes (Addendum)
[facility] Provider Note   CSN: [id number] Arrival date & time: 09/14/21  1819  An emergency department physician performed an initial assessment on this suspected stroke patient at 1854.  History  Chief Complaint  Patient presents with   Headache   Numbness    Anne Robinson is a 31 y.o. female.  Patient with history of kidney failure on dialysis, due for dialysis tomorrow, history of ventricular shunt presents to ER chief complaint of right face and right arm numbness and tingling ongoing for about an hour.  Last known well was approximately 5:15 PM.  Patient says she was at rest when she noticed the symptoms.  They are still present but improving.  Also complaining of a headache around the same time.  Denies fevers or cough denies vomiting or diarrhea.  Denies any difficulty speaking or any new weakness.       Home Medications Prior to Admission medications   Medication Sig Start Date End Date Taking? Authorizing Provider  amLODipine (NORVASC) 10 MG tablet Take by mouth. 05/10/19   [provider]  calcitRIOL (ROCALTROL) 0.25 MCG capsule  06/09/19   [provider]  calcium carbonate (OS-CAL) 1250 (500 Ca) MG chewable tablet Chew by mouth.    [provider]  diazepam (VALIUM) 5 MG tablet Take 1 tablet at bedtime as needed for sleep. 12/07/19   Molpus, John, MD  EPINEPHrine 0.3 mg/0.3 mL IJ SOAJ injection Inject into the muscle. 07/12/13   [provider]  famotidine (PEPCID) 20 MG tablet Take by mouth. 01/29/18 09/14/19  [provider]  fluconazole (DIFLUCAN) 150 MG tablet Take 150 mg daily as needed by mouth (yeast infection).  01/11/17   [provider]  fluticasone (FLONASE) 50 MCG/ACT nasal spray Place into the nose.    [provider]  furosemide (LASIX) 20 MG tablet Take by mouth. 10/08/18   [provider]  HYDROcodone-acetaminophen (NORCO/VICODIN) 5-325 MG tablet Take by  mouth. 01/27/19   [provider]  labetalol (NORMODYNE) 300 MG tablet Take 300 mg by mouth 3 (three) times daily. 06/07/19   [provider]  labetalol (NORMODYNE) 300 MG tablet Take by mouth. 05/01/18   [provider]  loratadine (CLARITIN) 10 MG tablet Take 10 mg by mouth daily as needed for allergies.     [provider]  mycophenolate (CELLCEPT) 500 MG tablet  02/12/18   [provider]  nebivolol (BYSTOLIC) 10 MG tablet  40/98/11   [provider]  pantoprazole (PROTONIX) 40 MG tablet Take by mouth. 08/29/19 08/28/20  [provider]  predniSONE (DELTASONE) 10 MG tablet Take by mouth. 05/20/19   [provider]  predniSONE (DELTASONE) 20 MG tablet Take 2 tablets (40 mg total) by mouth daily with breakfast. 01/29/17   Patrecia Pour, MD  temazepam (RESTORIL) 15 MG capsule Take by mouth. 01/21/19 12/07/19  [provider]      Allergies    Metoclopramide and Lisinopril    Review of Systems   Review of Systems  Constitutional:  Negative for fever.  HENT:  Negative for ear pain.   Eyes:  Negative for pain.  Respiratory:  Negative for cough.   Cardiovascular:  Negative for chest pain.  Gastrointestinal:  Negative for abdominal pain.  Genitourinary:  Negative for flank pain.  Musculoskeletal:  Negative for back pain.  Skin:  Negative for rash.  Neurological:  Positive for headaches.    Physical Exam Updated Vital Signs BP Marland Kitchen)  168/143   Pulse 96   Temp (!) 97.5 F (36.4 C) (Oral)   Resp 18   Ht 4\' 11"  (1.499 m)   Wt 75.3 kg   LMP 09/10/2021 (Approximate)   SpO2 100%   BMI 33.53 kg/m  Physical Exam Constitutional:      General: She is not in acute distress.    Appearance: Normal appearance.  HENT:     Head: Normocephalic.     Nose: Nose normal.  Eyes:     Extraocular Movements: Extraocular movements intact.  Cardiovascular:     Rate and Rhythm: Normal rate.  Pulmonary:     Effort: Pulmonary  effort is normal.  Musculoskeletal:        General: Normal range of motion.     Cervical back: Normal range of motion.  Neurological:     Mental Status: She is alert.     Comments: Patient awake and alert answering questions moving all extremities.  Motor strength is 5/5 strength all extremities.  Sensation deficit in the right face and right upper extremity per patient.  Otherwise neurovascular intact extremities.     ED Results / Procedures / Treatments   Labs (all labs ordered are listed, but only abnormal results are displayed) Labs Reviewed  CBC - Abnormal; Notable for the following components:      Result Value   RBC 3.33 (*)    Hemoglobin 10.2 (*)    HCT 34.0 (*)    MCV 102.1 (*)    RDW 20.9 (*)    Platelets 132 (*)    nRBC 0.9 (*)    All other components within normal limits  DIFFERENTIAL - Abnormal; Notable for the following components:   Abs Immature Granulocytes 0.11 (*)    All other components within normal limits  COMPREHENSIVE METABOLIC PANEL - Abnormal; Notable for the following components:   BUN 30 (*)    Creatinine, Ser 8.90 (*)    Calcium 8.7 (*)    GFR, Estimated 6 (*)    All other components within normal limits  RESP PANEL BY RT-PCR (FLU A&B, COVID) ARPGX2  ETHANOL  PROTIME-INR  APTT  RAPID URINE DRUG SCREEN, HOSP PERFORMED  URINALYSIS, ROUTINE W REFLEX MICROSCOPIC  PREGNANCY, URINE  CBG MONITORING, ED    EKG None  Radiology CT HEAD CODE STROKE WO CONTRAST  Result Date: 09/14/2021 CLINICAL DATA:  Code stroke. Headache. Right-sided numbness to the face and arm. Slurred speech and confusion. EXAM: CT HEAD WITHOUT CONTRAST TECHNIQUE: Contiguous axial images were obtained from the base of the skull through the vertex without intravenous contrast. RADIATION DOSE REDUCTION: This exam was performed according to the departmental dose-optimization program which includes automated exposure control, adjustment of the mA and/or kV according to patient size  and/or use of iterative reconstruction technique. COMPARISON:  CT head without contrast 11/05/2019 FINDINGS: Brain: A right frontal ventriculostomy catheter is in place and stable. Ventricles are of normal size. No acute infarct, hemorrhage, or mass lesion is present. Basal ganglia are within normal limits. Insular ribbon is normal. No acute or focal cortical abnormality is present. No significant extraaxial fluid collection is present. The brainstem and cerebellum are within normal limits. Vascular: No hyperdense vessel or unexpected calcification. Skull: Right frontal burr hole for ventriculostomy catheter present. Calvarium otherwise normal. No significant extracranial soft tissue lesion is present. Sinuses/Orbits: The paranasal sinuses and mastoid air cells are clear. The globes and orbits are within normal limits. ASPECTS [facility] Stroke Program Early CT Score) - Ganglionic level  infarction (caudate, lentiform nuclei, internal capsule, insula, M1-M3 cortex): 7/7 - Supraganglionic infarction (M4-M6 cortex): 3/3 Total score (0-10 with 10 being normal): 10/10 IMPRESSION: 1. Stable right frontal ventriculostomy catheter. 2. No acute intracranial abnormality or significant interval change. 3. Aspects 10/10 These results were called by telephone at the time of interpretation on 09/14/2021 at 7:15 pm to provider [facility] , who verbally acknowledged these results. Electronically Signed   By: San Morelle M.D.   On: 09/14/2021 19:16    Procedures .Critical Care  Performed by: Luna Fuse, MD Authorized by: Luna Fuse, MD   Critical care provider statement:    Critical care time (minutes):  40   Critical care time was exclusive of:  Separately billable procedures and treating other patients and teaching time   Critical care was necessary to treat or prevent imminent or life-threatening deterioration of the following conditions:  CNS failure or compromise     Medications Ordered in  ED Medications  aspirin chewable tablet 324 mg (has no administration in time range)  prochlorperazine (COMPAZINE) injection 10 mg (10 mg Intravenous Given 09/14/21 2040)    ED Course/ Medical Decision Making/ A&P                           Medical Decision Making Amount and/or Complexity of Data Reviewed Labs: ordered. Radiology: ordered.  Risk Prescription drug management. Decision regarding hospitalization.   Stroke was activated as the patient has been having symptoms for 1 hour.  Review of record shows office visit yesterday for lupus.  Cardiac monitoring showing sinus rhythm.  Sinus studies were sent, chemistry normal white count normal CBC normal.  CT head shows no acute findings per radiology.  Case reviewed by neurology, recommend admission for TIA.  MRI of the brain ordered and pending.  Headache cocktail provided.  Consultation with hospitalist for admission.        Final Clinical Impression(s) / ED Diagnoses Final diagnoses:  TIA (transient ischemic attack)    Rx / DC Orders ED Discharge Orders     None         Luna Fuse, MD 09/14/21 2106    Luna Fuse, MD 09/14/21 2107    Luna Fuse, MD 09/14/21 2113

## 2021-09-14 NOTE — Plan of Care (Signed)
[facility] will assume care on arrival to accepting facility. Until arrival, care as per EDP. However, [facility] available 24/7 for questions and assistance.  Nursing staff, please page [facility] Admits and Consults ([phone]) as soon as the patient arrives to the hospital.   

## 2021-09-14 NOTE — ED Notes (Signed)
LKW 1730, teleneuro on at bedside.  Pt states had a headache at 1730 and started having right sided numbness to face and arm with slurred speech and confusion. No slurred speech at this time, alert and oriented. States she continues to have numbness

## 2021-09-14 NOTE — ED Notes (Signed)
Pt reports pain now 5/10, down from 10/10 earlier

## 2021-09-14 NOTE — ED Notes (Signed)
Teleneuro eval in process at bedside with Dr. Cheral Marker and this RN

## 2021-09-14 NOTE — ED Triage Notes (Signed)
Pt reports sudden headache that started 1 hour ago. After headache started, now having right sided numbness in face arm and leg. Has VT shunt. Endorses blurry vision in both eyes and slurred speech heard by mom. Pt speaking clearly in triage.

## 2021-09-15 ENCOUNTER — Emergency Department (HOSPITAL_BASED_OUTPATIENT_CLINIC_OR_DEPARTMENT_OTHER): Payer: Medicare Other

## 2021-09-15 ENCOUNTER — Observation Stay (HOSPITAL_COMMUNITY): Payer: Medicare Other

## 2021-09-15 ENCOUNTER — Other Ambulatory Visit (HOSPITAL_COMMUNITY): Payer: Self-pay

## 2021-09-15 ENCOUNTER — Encounter (HOSPITAL_BASED_OUTPATIENT_CLINIC_OR_DEPARTMENT_OTHER): Payer: Self-pay | Admitting: Internal Medicine

## 2021-09-15 DIAGNOSIS — Z7952 Long term (current) use of systemic steroids: Secondary | ICD-10-CM

## 2021-09-15 DIAGNOSIS — G932 Benign intracranial hypertension: Secondary | ICD-10-CM

## 2021-09-15 DIAGNOSIS — R2990 Unspecified symptoms and signs involving the nervous system: Secondary | ICD-10-CM | POA: Diagnosis not present

## 2021-09-15 DIAGNOSIS — N185 Chronic kidney disease, stage 5: Secondary | ICD-10-CM | POA: Diagnosis not present

## 2021-09-15 DIAGNOSIS — R319 Hematuria, unspecified: Secondary | ICD-10-CM | POA: Diagnosis not present

## 2021-09-15 DIAGNOSIS — D84821 Immunodeficiency due to drugs: Secondary | ICD-10-CM

## 2021-09-15 DIAGNOSIS — Z86718 Personal history of other venous thrombosis and embolism: Secondary | ICD-10-CM | POA: Diagnosis not present

## 2021-09-15 DIAGNOSIS — I10 Essential (primary) hypertension: Secondary | ICD-10-CM

## 2021-09-15 DIAGNOSIS — G43109 Migraine with aura, not intractable, without status migrainosus: Secondary | ICD-10-CM | POA: Diagnosis not present

## 2021-09-15 DIAGNOSIS — D631 Anemia in chronic kidney disease: Secondary | ICD-10-CM | POA: Diagnosis not present

## 2021-09-15 DIAGNOSIS — I120 Hypertensive chronic kidney disease with stage 5 chronic kidney disease or end stage renal disease: Secondary | ICD-10-CM | POA: Diagnosis not present

## 2021-09-15 DIAGNOSIS — Z7982 Long term (current) use of aspirin: Secondary | ICD-10-CM | POA: Diagnosis not present

## 2021-09-15 DIAGNOSIS — N186 End stage renal disease: Secondary | ICD-10-CM

## 2021-09-15 DIAGNOSIS — T380X5A Adverse effect of glucocorticoids and synthetic analogues, initial encounter: Secondary | ICD-10-CM

## 2021-09-15 DIAGNOSIS — R29818 Other symptoms and signs involving the nervous system: Secondary | ICD-10-CM | POA: Diagnosis not present

## 2021-09-15 DIAGNOSIS — Z7901 Long term (current) use of anticoagulants: Secondary | ICD-10-CM | POA: Diagnosis not present

## 2021-09-15 DIAGNOSIS — E039 Hypothyroidism, unspecified: Secondary | ICD-10-CM

## 2021-09-15 DIAGNOSIS — R200 Anesthesia of skin: Secondary | ICD-10-CM | POA: Diagnosis present

## 2021-09-15 DIAGNOSIS — Z992 Dependence on renal dialysis: Secondary | ICD-10-CM | POA: Diagnosis not present

## 2021-09-15 DIAGNOSIS — Z79899 Other long term (current) drug therapy: Secondary | ICD-10-CM | POA: Diagnosis not present

## 2021-09-15 DIAGNOSIS — G459 Transient cerebral ischemic attack, unspecified: Secondary | ICD-10-CM | POA: Diagnosis not present

## 2021-09-15 DIAGNOSIS — Z20822 Contact with and (suspected) exposure to covid-19: Secondary | ICD-10-CM | POA: Diagnosis not present

## 2021-09-15 DIAGNOSIS — D696 Thrombocytopenia, unspecified: Secondary | ICD-10-CM

## 2021-09-15 DIAGNOSIS — Z982 Presence of cerebrospinal fluid drainage device: Secondary | ICD-10-CM

## 2021-09-15 DIAGNOSIS — M329 Systemic lupus erythematosus, unspecified: Secondary | ICD-10-CM

## 2021-09-15 LAB — RENAL FUNCTION PANEL
Albumin: 3.2 g/dL — ABNORMAL LOW (ref 3.5–5.0)
Anion gap: 9 (ref 5–15)
BUN: 38 mg/dL — ABNORMAL HIGH (ref 6–20)
CO2: 26 mmol/L (ref 22–32)
Calcium: 8.1 mg/dL — ABNORMAL LOW (ref 8.9–10.3)
Chloride: 104 mmol/L (ref 98–111)
Creatinine, Ser: 9.71 mg/dL — ABNORMAL HIGH (ref 0.44–1.00)
GFR, Estimated: 5 mL/min — ABNORMAL LOW (ref >60)
Glucose, Bld: 86 mg/dL (ref 70–99)
Phosphorus: 5.2 mg/dL — ABNORMAL HIGH (ref 2.5–4.6)
Potassium: 5.1 mmol/L (ref 3.5–5.1)
Sodium: 139 mmol/L (ref 135–145)

## 2021-09-15 LAB — URINALYSIS, ROUTINE W REFLEX MICROSCOPIC
Bilirubin Urine: NEGATIVE
Glucose, UA: 100 mg/dL — AB
Ketones, ur: NEGATIVE mg/dL
Leukocytes,Ua: NEGATIVE
Nitrite: NEGATIVE
Protein, ur: >=300 mg/dL — AB
Specific Gravity, Urine: 1.015 (ref 1.005–1.030)
pH: >=9 (ref 5.0–8.0)

## 2021-09-15 LAB — HIV ANTIBODY (ROUTINE TESTING W REFLEX): HIV Screen 4th Generation wRfx: NONREACTIVE

## 2021-09-15 LAB — RAPID URINE DRUG SCREEN, HOSP PERFORMED
Amphetamines: NOT DETECTED
Barbiturates: NOT DETECTED
Benzodiazepines: NOT DETECTED
Cocaine: NOT DETECTED
Opiates: NOT DETECTED
Tetrahydrocannabinol: NOT DETECTED

## 2021-09-15 LAB — CBC WITH DIFFERENTIAL/PLATELET
Abs Immature Granulocytes: 0.09 10*3/uL — ABNORMAL HIGH (ref 0.00–0.07)
Basophils Absolute: 0 10*3/uL (ref 0.0–0.1)
Basophils Relative: 1 %
Eosinophils Absolute: 0.3 10*3/uL (ref 0.0–0.5)
Eosinophils Relative: 6 %
HCT: 31.8 % — ABNORMAL LOW (ref 36.0–46.0)
Hemoglobin: 9.4 g/dL — ABNORMAL LOW (ref 12.0–15.0)
Immature Granulocytes: 2 %
Lymphocytes Relative: 28 %
Lymphs Abs: 1.3 10*3/uL (ref 0.7–4.0)
MCH: 30.1 pg (ref 26.0–34.0)
MCHC: 29.6 g/dL — ABNORMAL LOW (ref 30.0–36.0)
MCV: 101.9 fL — ABNORMAL HIGH (ref 80.0–100.0)
Monocytes Absolute: 0.5 10*3/uL (ref 0.1–1.0)
Monocytes Relative: 11 %
Neutro Abs: 2.6 10*3/uL (ref 1.7–7.7)
Neutrophils Relative %: 52 %
Platelets: 149 10*3/uL — ABNORMAL LOW (ref 150–400)
RBC: 3.12 MIL/uL — ABNORMAL LOW (ref 3.87–5.11)
RDW: 20.9 % — ABNORMAL HIGH (ref 11.5–15.5)
WBC: 4.8 10*3/uL (ref 4.0–10.5)
nRBC: 1.5 % — ABNORMAL HIGH (ref 0.0–0.2)

## 2021-09-15 LAB — URINALYSIS, MICROSCOPIC (REFLEX): RBC / HPF: >50 RBC/hpf (ref 0–5)

## 2021-09-15 LAB — PREGNANCY, URINE: Preg Test, Ur: NEGATIVE

## 2021-09-15 LAB — HEPATITIS B SURFACE ANTIGEN: Hepatitis B Surface Ag: NONREACTIVE

## 2021-09-15 LAB — HEPATITIS C ANTIBODY: HCV Ab: NONREACTIVE

## 2021-09-15 LAB — HEPATITIS B CORE ANTIBODY, TOTAL: Hep B Core Total Ab: NONREACTIVE

## 2021-09-15 LAB — HEPATITIS B SURFACE ANTIBODY,QUALITATIVE: Hep B S Ab: NONREACTIVE

## 2021-09-15 MED ORDER — STROKE: EARLY STAGES OF RECOVERY BOOK: Freq: Once | Status: DC

## 2021-09-15 MED ORDER — APIXABAN 5 MG PO TABS
5.0000 mg | ORAL_TABLET | Freq: Two times a day (BID) | ORAL | Status: DC
  Administered 2021-09-16: 5 mg via ORAL
  Filled 2021-09-15 (×2): qty 1

## 2021-09-15 MED ORDER — TIZANIDINE HCL 4 MG PO TABS
4.0000 mg | ORAL_TABLET | Freq: Every day | ORAL | Status: DC
Start: 2021-09-15 — End: 2021-09-16
  Administered 2021-09-16: 4 mg via ORAL
  Filled 2021-09-15: qty 1

## 2021-09-15 MED ORDER — LORATADINE 10 MG PO TABS
10.0000 mg | ORAL_TABLET | Freq: Every day | ORAL | Status: DC
  Administered 2021-09-15: 10 mg via ORAL
  Filled 2021-09-15 (×2): qty 1

## 2021-09-15 MED ORDER — CHLORHEXIDINE GLUCONATE CLOTH 2 % EX PADS: 6.0000 | MEDICATED_PAD | Freq: Every day | CUTANEOUS | Status: DC

## 2021-09-15 MED ORDER — ACETAMINOPHEN 650 MG RE SUPP: 650.0000 mg | RECTAL | Status: DC | PRN

## 2021-09-15 MED ORDER — BUTALBITAL-APAP-CAFFEINE 50-325-40 MG PO TABS: 1.0000 | ORAL_TABLET | Freq: Four times a day (QID) | ORAL | 0 refills | Status: AC | PRN

## 2021-09-15 MED ORDER — HYDRALAZINE HCL 25 MG PO TABS
25.0000 mg | ORAL_TABLET | Freq: Three times a day (TID) | ORAL | Status: DC
  Administered 2021-09-15 – 2021-09-16 (×2): 25 mg via ORAL
  Filled 2021-09-15 (×3): qty 1

## 2021-09-15 MED ORDER — ORAL CARE MOUTH RINSE: 15.0000 mL | OROMUCOSAL | Status: DC | PRN

## 2021-09-15 MED ORDER — PENTAFLUOROPROP-TETRAFLUOROETH EX AERO
INHALATION_SPRAY | CUTANEOUS | Status: AC
  Filled 2021-09-15: qty 103.5

## 2021-09-15 MED ORDER — CALCIUM ACETATE (PHOS BINDER) 667 MG PO CAPS
667.0000 mg | ORAL_CAPSULE | Freq: Three times a day (TID) | ORAL | Status: DC
  Administered 2021-09-16: 667 mg via ORAL
  Filled 2021-09-15 (×2): qty 1

## 2021-09-15 MED ORDER — FLUTICASONE PROPIONATE 50 MCG/ACT NA SUSP
1.0000 | Freq: Every day | NASAL | Status: DC | PRN
Start: 2021-09-15 — End: 2021-09-16

## 2021-09-15 MED ORDER — ATORVASTATIN CALCIUM 40 MG PO TABS
40.0000 mg | ORAL_TABLET | Freq: Every day | ORAL | Status: DC
Start: 2021-09-15 — End: 2021-09-16
  Administered 2021-09-15: 40 mg via ORAL
  Filled 2021-09-15 (×2): qty 1

## 2021-09-15 MED ORDER — TRAZODONE HCL 50 MG PO TABS
25.0000 mg | ORAL_TABLET | Freq: Every evening | ORAL | Status: DC | PRN
  Administered 2021-09-16: 25 mg via ORAL
  Filled 2021-09-15: qty 1

## 2021-09-15 MED ORDER — LEVOTHYROXINE SODIUM 50 MCG PO TABS
50.0000 ug | ORAL_TABLET | Freq: Every day | ORAL | Status: DC
  Administered 2021-09-16: 50 ug via ORAL
  Filled 2021-09-15: qty 1

## 2021-09-15 MED ORDER — BUTALBITAL-APAP-CAFFEINE 50-325-40 MG PO TABS
1.0000 | ORAL_TABLET | Freq: Four times a day (QID) | ORAL | 0 refills | Status: DC | PRN
  Filled 2021-09-15: qty 10, 3d supply, fill #0

## 2021-09-15 MED ORDER — ONDANSETRON HCL 4 MG/2ML IJ SOLN
4.0000 mg | Freq: Three times a day (TID) | INTRAMUSCULAR | Status: DC | PRN
  Administered 2021-09-15: 4 mg via INTRAVENOUS
  Filled 2021-09-15: qty 2

## 2021-09-15 MED ORDER — SENNOSIDES-DOCUSATE SODIUM 8.6-50 MG PO TABS: 1.0000 | ORAL_TABLET | Freq: Every evening | ORAL | Status: DC | PRN

## 2021-09-15 MED ORDER — LEVETIRACETAM 500 MG PO TABS
500.0000 mg | ORAL_TABLET | Freq: Every day | ORAL | Status: DC
  Administered 2021-09-16: 500 mg via ORAL
  Filled 2021-09-15: qty 1

## 2021-09-15 MED ORDER — AMLODIPINE BESYLATE 5 MG PO TABS
5.0000 mg | ORAL_TABLET | Freq: Every day | ORAL | Status: DC
  Administered 2021-09-15: 5 mg via ORAL
  Filled 2021-09-15 (×2): qty 1

## 2021-09-15 MED ORDER — ASPIRIN 81 MG PO CHEW
81.0000 mg | CHEWABLE_TABLET | Freq: Every day | ORAL | Status: DC
  Administered 2021-09-15: 81 mg via ORAL
  Filled 2021-09-15 (×2): qty 1

## 2021-09-15 MED ORDER — METOPROLOL SUCCINATE ER 50 MG PO TB24
50.0000 mg | ORAL_TABLET | Freq: Two times a day (BID) | ORAL | Status: DC
  Administered 2021-09-15 – 2021-09-16 (×2): 50 mg via ORAL
  Filled 2021-09-15 (×3): qty 1

## 2021-09-15 MED ORDER — ACETAMINOPHEN 325 MG PO TABS
650.0000 mg | ORAL_TABLET | ORAL | Status: DC | PRN
  Filled 2021-09-15: qty 2

## 2021-09-15 MED ORDER — PANTOPRAZOLE SODIUM 40 MG PO TBEC
40.0000 mg | DELAYED_RELEASE_TABLET | Freq: Every day | ORAL | Status: DC
Start: 2021-09-15 — End: 2021-09-16
  Administered 2021-09-15: 40 mg via ORAL
  Filled 2021-09-15 (×2): qty 1

## 2021-09-15 MED ORDER — PREDNISONE 5 MG PO TABS
5.0000 mg | ORAL_TABLET | Freq: Every day | ORAL | Status: DC
  Filled 2021-09-15: qty 1

## 2021-09-15 MED ORDER — ACETAMINOPHEN 160 MG/5ML PO SOLN: 650.0000 mg | ORAL | Status: DC | PRN

## 2021-09-15 MED ORDER — BUTALBITAL-APAP-CAFFEINE 50-325-40 MG PO TABS
1.0000 | ORAL_TABLET | Freq: Four times a day (QID) | ORAL | Status: DC | PRN
Start: 2021-09-15 — End: 2021-09-16

## 2021-09-15 NOTE — Progress Notes (Signed)
Received order from MD to administer BP meds asap- awaiting pharmacy approval.

## 2021-09-15 NOTE — Consult Note (Signed)
Renal Service Consult Note [facility]  [facility] Robinson 09/15/2021 Anne Blazing, MD Requesting Physician: Anne Robinson  Reason for Consult: ESRD pt w/ stroke-like symptoms HPI: The patient is a 31 y.o. year-old w/ hx of HTN, SLE, ESRD on HD, pseudotumor cerebri sp shunt, ESRD on HD who presents to ED yesterday w/ HA and R sided numbness in face, arm and leg. Admitted for stroke work-up. Asked to see for dialysis.   Pt seen in room, in good spirits. Started HD in 2020, [facility]. Has LUA AVG. Her max UF is around 2 L. She denies any recent HD issues. No current SOB, cough, CP or abd pain.   ROS - denies CP, no joint pain, no HA, no blurry vision, no rash, no diarrhea, no nausea/ vomiting   Past Medical History  Past Medical History:  Diagnosis Date   HTN (hypertension) 05/15/2016   Lupus ([place])    Lupus nephritis ([place])    Pseudotumor cerebri    Renal disorder    Thyroid disease    Past Surgical History  Past Surgical History:  Procedure Laterality Date   INSERTION OF DIALYSIS CATHETER     RENAL BIOPSY     SHOULDER SURGERY     VENTRICULAR ATRIAL SHUNT Right 11/27/2018   [employer] programmable shunt set at 150   Family History No family history on file. Social History  reports that she has never smoked. She has never used smokeless tobacco. She reports that she does not drink alcohol and does not use drugs. Allergies  Allergies  Allergen Reactions   Reglan [Metoclopramide] Itching, Palpitations and Other (See Comments)    Muscle twitching restlessness    Zestril [Lisinopril] Swelling   Home medications Prior to Admission medications   Medication Sig Start Date End Date Taking? Authorizing Provider  fluticasone (FLONASE) 50 MCG/ACT nasal spray Place into the nose.   Yes [provider]  predniSONE (DELTASONE) 10 MG tablet Take 10 mg by mouth daily with breakfast.   Yes [provider]  amLODipine  (NORVASC) 5 MG tablet Take 5 mg by mouth daily. 09/12/21   [provider]  aspirin 81 MG chewable tablet Chew 81 mg by mouth daily. 07/11/21   [provider]  atorvastatin (LIPITOR) 40 MG tablet Take 40 mg by mouth daily. 07/11/21   [provider]  calcium acetate (PHOSLO) 667 MG capsule Take 667 mg by mouth 3 (three) times daily. 07/22/21   [provider]  cetirizine (ZYRTEC) 10 MG tablet Take 10 mg by mouth daily. 08/14/21   [provider]  ELIQUIS 5 MG TABS tablet Take 5 mg by mouth 2 (two) times daily. 07/11/21   [provider]  hydrALAZINE (APRESOLINE) 25 MG tablet Take 25 mg by mouth 3 (three) times daily. 09/13/21   [provider]  levETIRAcetam (KEPPRA) 500 MG tablet Take 500 mg by mouth daily. 08/12/21   [provider]  levothyroxine (SYNTHROID) 50 MCG tablet Take 50 mcg by mouth daily. 09/04/21   [provider]  medroxyPROGESTERone (PROVERA) 10 MG tablet Take by mouth. 08/11/21   [provider]  metoprolol succinate (TOPROL-XL) 50 MG 24 hr tablet Take 50 mg by mouth 2 (two) times daily. 07/11/21   [provider]  norethindrone (MICRONOR) 0.35 MG tablet Take 0.35 mg by mouth daily. 06/09/21   [provider]  pantoprazole (PROTONIX) 40 MG tablet Take 40 mg by mouth daily. 08/29/19  11/13/21  [provider]  tiZANidine (ZANAFLEX) 4 MG tablet Take 4 mg by mouth at bedtime. 09/13/21   [provider]  traZODone (DESYREL) 50 MG tablet Take 25 mg by mouth at bedtime as needed for sleep. 08/14/21   [provider]  temazepam (RESTORIL) 15 MG capsule Take by mouth. 01/21/19 12/07/19  [provider]     Vitals:   09/15/21 0530 09/15/21 0807 09/15/21 1006 09/15/21 1126  BP: (!) 165/103 (!) 174/115 (!) 150/103 (!) 193/99  Pulse: 75 82 85 85  Resp: 15 20 17 20   Temp:  98.1 F (36.7 C) 97.7 F (36.5 C) 98 F (36.7 C)  TempSrc:  Oral Oral Oral  SpO2: 97% 97% 100%  100%  Weight:      Height:       Exam Gen alert, no distress No rash, cyanosis or gangrene Sclera anicteric, throat clear  No jvd or bruits Chest clear bilat to bases, no rales/ wheezing RRR no MRG Abd soft ntnd no mass or ascites +bs GU defer MS no joint effusions or deformity Ext no LE or UE edema, no wounds or ulcers Neuro is alert, Ox 3 , nf    L UE AVG+bruit   Home meds include - prednisone 10 qam, amlodipine 5, aspirin, atorvastatin, calcium acetate 1 ac tid, eliquis 5 bid, hydralazine 25 tid, levetiracetam, levothyroxine, medroxyprogesterone, metoprolol xl 50 bid, norethindrone, pantoprazole, tizanadine, trazodone, prns/ vits/ supps      OP HD: MWF [facility]/ [address]   L arm AVG     Assessment/ Plan: Headache/ stroke-like symptoms - w/u in progress, per neurology/ pmd.  ESRD - on HD since 2020 w/ [facility]. HD MWF. Plan HD tonight or possibly tomorrow (depending on staffing).  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 Cont binder w/ meals. Get OP records in am.  H/o SLE H/o pseudotumor cerebri - sp ventricular atrial shunt 11/2018      Anne Splinter  MD 09/15/2021, 2:14 PM Recent Labs  Lab 09/14/21 1949 09/15/21 0518 09/15/21 0544  HGB 10.2* 9.4*  --   ALBUMIN 3.7  --  3.2*  CALCIUM 8.7*  --  8.1*  PHOS  --   --  5.2*  CREATININE 8.90*  --  9.71*  K 5.1  --  5.1

## 2021-09-15 NOTE — Progress Notes (Signed)
Consent for HD obtained- signed by patient.

## 2021-09-15 NOTE — Progress Notes (Signed)
OTF to MRI 

## 2021-09-15 NOTE — TOC Initial Note (Signed)
Transition of Care [facility]) - Initial/Assessment Note    Patient Details  Name: Anne Robinson MRN: [id number] Date of Birth: [birth year]  Transition of [facility]) CM/SW Contact:    Pollie Friar, RN Phone Number: 09/15/2021, 3:02 PM  Clinical Narrative:                 Pt is from home with parent. She is ESRD. Admitted to r/o CVA.  Awaiting therapy evaluation.  ToC following.  Expected Discharge Plan: Home/Self Care Barriers to Discharge: Continued Medical Work up   Patient Goals and CMS Choice        Expected Discharge Plan and Services Expected Discharge Plan: Home/Self Care                                              Prior Living Arrangements/Services                       Activities of Daily Living Home Assistive Devices/Equipment: None ADL Screening (condition at time of admission) Patient's cognitive ability adequate to safely complete daily activities?: Yes Is the patient deaf or have difficulty hearing?: No Does the patient have difficulty seeing, even when wearing glasses/contacts?: No Does the patient have difficulty concentrating, remembering, or making decisions?: No Patient able to express need for assistance with ADLs?: Yes Does the patient have difficulty dressing or bathing?: No Independently performs ADLs?: Yes (appropriate for developmental age) Does the patient have difficulty walking or climbing stairs?: No Weakness of Legs: None Weakness of Arms/Hands: None  Permission Sought/Granted                  Emotional Assessment              Admission diagnosis:  TIA (transient ischemic attack) [G45.9] Patient Active Problem List   Diagnosis Date Noted   TIA (transient ischemic attack) 09/14/2021   Cellulitis of chest wall    Acute renal failure ([place]) 01/26/2017   Exacerbation of systemic lupus erythematosus ([place]) 01/26/2017   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   Tachypnea 01/24/2017   Dyspnea 01/24/2017   Macular  rash 01/24/2017   Abscess of left axilla 01/24/2017   Abscess of axilla, left 01/23/2017   Hypotension    Sepsis ([place]) 05/15/2016   Elevated troponin 05/15/2016   HTN (hypertension) 05/15/2016   SLE (systemic lupus erythematosus) ([place]) 05/15/2016   PCP:  Karleen Hampshire., MD Pharmacy:   [facility] [phone] - [place], [place] - [address] AT [place] [address] [place] [address] Phone: [phone] Fax: [phone]     Social Determinants of Health (SDOH) Interventions    Readmission Risk Interventions     No data to display

## 2021-09-15 NOTE — Discharge Summary (Addendum)
Physician Discharge Summary   Patient: Anne Robinson MRN: [id number] DOB: [birth year]  Admit date:     09/14/2021  Discharge date: 09/15/21  Discharge Physician: Norval Morton   PCP: Karleen Hampshire., MD   Recommendations at discharge:   Follow-up with neurology and neurosurgery to have shunt reprogramming in a.m. Follow-up urine culture  Discharge Diagnoses: Principal Problem:   Complicated migraine Active Problems:   ESRD on dialysis ([place])   Idiopathic intracranial hypertension   SLE (systemic lupus erythematosus) (HCC)   Immunosuppression due to chronic steroid use (HCC)   Anemia of chronic renal failure   HTN (hypertension)   History of DVT (deep vein thrombosis)   Thrombocytopenia (HCC)   Hypothyroidism   S/P VP shunt  Resolved Problems:   * No resolved hospital problems. *  Hospital Course: Anne Robinson is a 31 y.o. 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  Patient reports that symptoms started around 5:30 PM yesterday afternoon.  She reported having numbness of the right side of her mouth, arm, and hand.  She reported that her mouth felt heavy and she was having some slurred speech with difficulty speaking at the time.  In addition to the numbness feelings she had some weakness in the right hand and noted some blurry vision.  At the time she reported having a headache with the discomfort behind both eyes and radiating to the left posterior aspect of her head.  She had tried taking ibuprofen without any improvement in symptoms.  Of note 1 week prior during dialysis she had experienced similar symptoms of the numbness feelings that resolved prior to her hemodialysis treatment being completed.  She has been  compliant with dialysis, but is due to have it today.  She had already set up appointments with her neurologist due to the fact that she had been having some issues with headache over the last week or so.  In addition she was scheduled to see her neurosurgeon tomorrow as well.  Patient states that she has been on her menstrual cycle as a reason for blood present in her urine.  Patient denies having any chest pain, palpitations, nausea, vomiting, or diarrhea.   Upon admission into the emergency department patient was noted to be afebrile with blood pressures elevated up to 193/99, and all other vital signs maintained.  Labs significant for hemoglobin 9.4, platelets 149, potassium 5.7, BUN 38, and creatinine 9.71.  UA significant for large hemoglobin with greater than 300 RBCs, many bacteria, 0-5 WBCs.  UDS and urine pregnancy screens were negative.  Given full dose aspirin and Compazine.  MRI did not note any acute signs of a stroke.  Assessment and Plan:  Complex migraine Acute.  Patient presented with complaints of right-sided mouth and upper extremity numbness, headache, blurred vision, and slurred speech starting yesterday afternoon.  Initial CT scan of the head noted stable right frontal ventriculostomy catheter without any acute abnormality.  Neurology had been consulted and recommended further work-up with MRI that did not show any signs of acute stroke.  Neurology feels symptoms most likely secondary to a complex migraine.  Fioricet given for short-term use symptoms until patient able to follow-up with her neurologist which she has appointment with tomorrow.  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  She is due for dialysis today.  Labs noted potassium 5.1, BUN 38, creatinine 9.71, and phosphorus 5.2.  Nephrology was consulted and patient was dialyzed.   Idiopathic intracranial hypertension s/p VP shunt with seizure disorder Patient has  outpatient follow-up appointment with neurosurgery. -Continue Keppra -Patient recommended continued outpatient follow-up with neurosurgery tomorrow to have VP shunt reprogramming following MRI   Lupus with immunosuppression due to chronic steroid use -Continue prednisone   Anemia of chronic kidney disease Hemoglobin 10.2-> 9.4 g/dL which appears around her baseline.  She is on her menstrual cycle but denies any other bleeding. -Continue to monitor   Hematuria Urinalysis positive for blood along with many bacteria.  Patient reports that she is currently on her menstrual cycle is the likely cause for having blood present in her urine. -Check urine culture   Essential hypertension -Resume amlodipine, hydralazine, and metoprolol when medically appropriate   History of DVT on chronic anticoagulation Patient reports prior history of blood clots on 2 separate places -Continue Eliquis   Thrombocytopenia Chronic.  Platelet count 149 which appears hard of prior. -Continue to monitor   Hypothyroidism -Continue levothyroxine   Hyperlipidemia -Continue atorvastatin   Pain control - [place] Controlled Substance Reporting System database was reviewed. and patient was instructed, not to drive, operate heavy machinery, perform activities at heights, swimming or participation in water activities or provide baby-sitting services while on Pain, Sleep and Anxiety Medications; until their outpatient Physician has advised to do so again. Also recommended to not to take more than prescribed Pain, Sleep and Anxiety Medications.  Consultants: Neurology Procedures performed: Hemodialysis Disposition: Home Diet recommendation:  Discharge Diet Orders (From admission, onward)     Start     Ordered   09/15/21 0000  Diet -renal       09/15/21 1925            DISCHARGE MEDICATION: Allergies as of 09/15/2021       Reactions   Reglan [metoclopramide] Itching, Palpitations, Other (See  Comments)   Muscle twitching restlessness   Zestril [lisinopril] Swelling        Medication List     TAKE these medications    amLODipine 5 MG tablet Commonly known as: NORVASC Take 5 mg by mouth daily.   aspirin 81 MG chewable tablet Chew 81 mg by mouth daily.   atorvastatin 40 MG tablet Commonly known as: LIPITOR Take 40 mg by mouth daily.   butalbital-acetaminophen-caffeine 50-325-40 MG tablet Commonly known as: FIORICET Take 1 tablet by mouth every 6 (six) hours as needed for headache.   calcium acetate 667 MG capsule Commonly known as: PHOSLO Take 667 mg by mouth 3 (three) times daily.   cetirizine 10 MG tablet Commonly known as: ZYRTEC Take 10 mg by mouth daily.   Eliquis 5 MG Tabs tablet Generic drug: apixaban Take 5 mg by mouth 2 (two) times daily.   fluticasone 50 MCG/ACT nasal spray Commonly known as: FLONASE Place 1 spray into both nostrils daily as needed for allergies.   hydrALAZINE 25 MG tablet Commonly known as: APRESOLINE Take 25 mg by mouth 3 (three) times daily.   ibuprofen 200 MG tablet Commonly known as: ADVIL Take 800 mg by mouth every 6 (six) hours as needed for headache.   levETIRAcetam 500 MG tablet Commonly known as: KEPPRA Take 500 mg by mouth daily.   levothyroxine 50 MCG tablet Commonly known as: SYNTHROID Take 50 mcg by mouth daily.   metoprolol succinate 50 MG  24 hr tablet Commonly known as: TOPROL-XL Take 50 mg by mouth 2 (two) times daily.   pantoprazole 40 MG tablet Commonly known as: PROTONIX Take 40 mg by mouth daily.   predniSONE 5 MG tablet Commonly known as: DELTASONE Take 5 mg by mouth daily with breakfast.   tiZANidine 4 MG tablet Commonly known as: ZANAFLEX Take 4 mg by mouth at bedtime.   traZODone 50 MG tablet Commonly known as: DESYREL Take 25 mg by mouth at bedtime as needed for sleep.        Discharge Exam: Danley Danker Weights   09/14/21 1835 09/15/21 2040  Weight: 75.3 kg 73.6 kg    Constitutional: Adult female currently no acute distress Eyes:  lids and conjunctivae normal.  Vision grossly intact ENMT: Mucous membranes are moist.  Normal dentition Neck: normal, supple.  No JVD appreciated Respiratory: clear to auscultation bilaterally, no significant wheezes or rhonchi appreciated.  O2 saturation currently maintained on room air. Cardiovascular: Regular rate and rhythm, no murmurs / rubs / gallops.  Trace lower extremity edema.  Fistula graft noted of the left upper extremity which is larger than the left upper extremity and reported to be chronic Abdomen: no tenderness.  Bowel sounds positive.  Musculoskeletal: no clubbing / cyanosis.  Decreased range of motion of bilateral shoulders. Skin: no rashes, lesions, ulcers. No induration Neurologic: CN 2-12 grossly intact. Sensation intact, DTR normal. Strength 5/5 in all 4.  Psychiatric: Normal judgment and insight. Alert and oriented x 3. Normal mood.     Condition at discharge: good  The results of significant diagnostics from this hospitalization (including imaging, microbiology, ancillary and laboratory) are listed below for reference.   Imaging Studies: MR BRAIN WO CONTRAST  Result Date: 09/15/2021 CLINICAL DATA:  Provided history: Headache, uncomplicated. Transient ischemic attack. EXAM: MRI HEAD WITHOUT CONTRAST MRA HEAD WITHOUT CONTRAST TECHNIQUE: Multiplanar, multi-echo pulse sequences of the brain and surrounding structures were acquired without intravenous contrast. Angiographic images of the Circle of Willis were acquired using MRA technique without intravenous contrast. COMPARISON:  Head CT 09/14/2021. MRI brain 03/20/2017. MR venogram head 04/07/2017. FINDINGS: MRI HEAD FINDINGS Mild intermittent motion degradation. Brain: Susceptibility artifact arising from the external components of a right-sided shunt system, obscuring portions of the right cerebral hemisphere on multiple sequences. As before, a right  frontoparietal approach ventricular catheter traverses the lateral ventricles, crossing midline and terminating in the region of the suprasellar cistern. Cerebral volume is normal. Within described limitations, there is no significant cerebral white matter disease. No appreciable acute infarct, intracranial mass, chronic intracranial blood products or extra-axial fluid collection. No midline shift or hydrocephalus. Vascular: Maintained flow voids within the proximal large arterial vessels. Skull and upper cervical spine: No focal suspicious marrow lesion. Sinuses/Orbits: No mass or acute finding within the imaged orbits. No significant paranasal sinus disease. MRA HEAD FINDINGS Anterior circulation: The intracranial internal carotid arteries are patent. The M1 middle cerebral arteries are patent. No M2 proximal branch occlusion or high-grade proximal stenosis. The anterior cerebral arteries are patent. No intracranial aneurysm is identified. Posterior circulation: The intracranial vertebral arteries are patent. The basilar artery is patent. The posterior cerebral arteries are patent. Hypoplastic P1 segments with sizable posterior communicating arteries, bilaterally. Anatomic variants: As described. IMPRESSION: MRI brain: 1. Artifact arising from the external components of a right-sided shunt system obscure portions of the right cerebral hemisphere on multiple sequences. Within this limitation, there is no evidence of acute intracranial abnormality. 2. Unchanged position of a right frontoparietal approach ventricular  catheter as compared to the head CT of 09/14/2021. No evidence of hydrocephalus. MRA head: No intracranial large vessel occlusion or proximal high-grade arterial stenosis. Electronically Signed   By: Kellie Simmering D.O.   On: 09/15/2021 13:18   MR ANGIO HEAD WO CONTRAST  Result Date: 09/15/2021 CLINICAL DATA:  Provided history: Headache, uncomplicated. Transient ischemic attack. EXAM: MRI HEAD WITHOUT  CONTRAST MRA HEAD WITHOUT CONTRAST TECHNIQUE: Multiplanar, multi-echo pulse sequences of the brain and surrounding structures were acquired without intravenous contrast. Angiographic images of the Circle of Willis were acquired using MRA technique without intravenous contrast. COMPARISON:  Head CT 09/14/2021. MRI brain 03/20/2017. MR venogram head 04/07/2017. FINDINGS: MRI HEAD FINDINGS Mild intermittent motion degradation. Brain: Susceptibility artifact arising from the external components of a right-sided shunt system, obscuring portions of the right cerebral hemisphere on multiple sequences. As before, a right frontoparietal approach ventricular catheter traverses the lateral ventricles, crossing midline and terminating in the region of the suprasellar cistern. Cerebral volume is normal. Within described limitations, there is no significant cerebral white matter disease. No appreciable acute infarct, intracranial mass, chronic intracranial blood products or extra-axial fluid collection. No midline shift or hydrocephalus. Vascular: Maintained flow voids within the proximal large arterial vessels. Skull and upper cervical spine: No focal suspicious marrow lesion. Sinuses/Orbits: No mass or acute finding within the imaged orbits. No significant paranasal sinus disease. MRA HEAD FINDINGS Anterior circulation: The intracranial internal carotid arteries are patent. The M1 middle cerebral arteries are patent. No M2 proximal branch occlusion or high-grade proximal stenosis. The anterior cerebral arteries are patent. No intracranial aneurysm is identified. Posterior circulation: The intracranial vertebral arteries are patent. The basilar artery is patent. The posterior cerebral arteries are patent. Hypoplastic P1 segments with sizable posterior communicating arteries, bilaterally. Anatomic variants: As described. IMPRESSION: MRI brain: 1. Artifact arising from the external components of a right-sided shunt system obscure  portions of the right cerebral hemisphere on multiple sequences. Within this limitation, there is no evidence of acute intracranial abnormality. 2. Unchanged position of a right frontoparietal approach ventricular catheter as compared to the head CT of 09/14/2021. No evidence of hydrocephalus. MRA head: No intracranial large vessel occlusion or proximal high-grade arterial stenosis. Electronically Signed   By: Kellie Simmering D.O.   On: 09/15/2021 13:18   CT HEAD CODE STROKE WO CONTRAST  Result Date: 09/14/2021 CLINICAL DATA:  Code stroke. Headache. Right-sided numbness to the face and arm. Slurred speech and confusion. EXAM: CT HEAD WITHOUT CONTRAST TECHNIQUE: Contiguous axial images were obtained from the base of the skull through the vertex without intravenous contrast. RADIATION DOSE REDUCTION: This exam was performed according to the departmental dose-optimization program which includes automated exposure control, adjustment of the mA and/or kV according to patient size and/or use of iterative reconstruction technique. COMPARISON:  CT head without contrast 11/05/2019 FINDINGS: Brain: A right frontal ventriculostomy catheter is in place and stable. Ventricles are of normal size. No acute infarct, hemorrhage, or mass lesion is present. Basal ganglia are within normal limits. Insular ribbon is normal. No acute or focal cortical abnormality is present. No significant extraaxial fluid collection is present. The brainstem and cerebellum are within normal limits. Vascular: No hyperdense vessel or unexpected calcification. Skull: Right frontal burr hole for ventriculostomy catheter present. Calvarium otherwise normal. No significant extracranial soft tissue lesion is present. Sinuses/Orbits: The paranasal sinuses and mastoid air cells are clear. The globes and orbits are within normal limits. ASPECTS [facility] Stroke Program Early CT Score) - Ganglionic level infarction (  caudate, lentiform nuclei, internal capsule,  insula, M1-M3 cortex): 7/7 - Supraganglionic infarction (M4-M6 cortex): 3/3 Total score (0-10 with 10 being normal): 10/10 IMPRESSION: 1. Stable right frontal ventriculostomy catheter. 2. No acute intracranial abnormality or significant interval change. 3. Aspects 10/10 These results were called by telephone at the time of interpretation on 09/14/2021 at 7:15 pm to provider [facility] , who verbally acknowledged these results. Electronically Signed   By: San Morelle M.D.   On: 09/14/2021 19:16    Microbiology: Results for orders placed or performed during the hospital encounter of 09/14/21  Resp Panel by RT-PCR (Flu A&B, Covid) Anterior Nasal Swab     Status: None   Collection Time: 09/14/21  7:57 PM   Specimen: Anterior Nasal Swab  Result Value Ref Range Status   SARS Coronavirus 2 by RT PCR NEGATIVE NEGATIVE Final    Comment: (NOTE) SARS-CoV-2 target nucleic acids are NOT DETECTED.  The SARS-CoV-2 RNA is generally detectable in upper respiratory specimens during the acute phase of infection. The lowest concentration of SARS-CoV-2 viral copies this assay can detect is 138 copies/mL. A negative result does not preclude SARS-Cov-2 infection and should not be used as the sole basis for treatment or other patient management decisions. A negative result may occur with  improper specimen collection/handling, submission of specimen other than nasopharyngeal swab, presence of viral mutation(s) within the areas targeted by this assay, and inadequate number of viral copies(<138 copies/mL). A negative result must be combined with clinical observations, patient history, and epidemiological information. The expected result is Negative.  Fact Sheet for Patients:  EntrepreneurPulse.com.au  Fact Sheet for Healthcare Providers:  IncredibleEmployment.be  This test is no t yet approved or cleared by the [place] FDA and  has been authorized for detection  and/or diagnosis of SARS-CoV-2 by FDA under an Emergency Use Authorization (EUA). This EUA will remain  in effect (meaning this test can be used) for the duration of the COVID-19 declaration under Section 564(b)(1) of the Act, 21 U.S.C.section 360bbb-3(b)(1), unless the authorization is terminated  or revoked sooner.       Influenza A by PCR NEGATIVE NEGATIVE Final   Influenza B by PCR NEGATIVE NEGATIVE Final    Comment: (NOTE) The Xpert Xpress SARS-CoV-2/FLU/RSV plus assay is intended as an aid in the diagnosis of influenza from Nasopharyngeal swab specimens and should not be used as a sole basis for treatment. Nasal washings and aspirates are unacceptable for Xpert Xpress SARS-CoV-2/FLU/RSV testing.  Fact Sheet for Patients: EntrepreneurPulse.com.au  Fact Sheet for Healthcare Providers: IncredibleEmployment.be  This test is not yet approved or cleared by the [place] FDA and has been authorized for detection and/or diagnosis of SARS-CoV-2 by FDA under an Emergency Use Authorization (EUA). This EUA will remain in effect (meaning this test can be used) for the duration of the COVID-19 declaration under Section 564(b)(1) of the Act, 21 U.S.C. section 360bbb-3(b)(1), unless the authorization is terminated or revoked.  Performed at [facility], [address] [place], [name] [address]     Labs: CBC: Recent Labs  Lab 09/14/21 1949 09/15/21 0518  WBC 6.5 4.8  NEUTROABS 4.8 2.6  HGB 10.2* 9.4*  HCT 34.0* 31.8*  MCV 102.1* 101.9*  PLT 132* 563*   Basic Metabolic Panel: Recent Labs  Lab 09/14/21 1949 09/15/21 0544  NA 140 139  K 5.1 5.1  CL 103 104  CO2 26 26  GLUCOSE 95 86  BUN 30* 38*  CREATININE 8.90* 9.71*  CALCIUM 8.7* 8.1*  PHOS  --  5.2*   Liver Function Tests: Recent Labs  Lab 09/14/21 1949 09/15/21 0544  AST 23  --   ALT 17  --   ALKPHOS 90  --   BILITOT 0.6  --   PROT 7.5  --   ALBUMIN  3.7 3.2*   CBG: Recent Labs  Lab 09/14/21 1858  GLUCAP 91     Signed: Norval Morton, MD [facility] 09/15/2021

## 2021-09-15 NOTE — ED Notes (Addendum)
Patient preferred MRI be done once transferred at [facility], [place] aware.

## 2021-09-15 NOTE — Progress Notes (Addendum)
STROKE TEAM PROGRESS NOTE   INTERVAL HISTORY Her family is at the bedside.  She is alert and awake in NAD. She states her numbness and slurred speech has resolved, but still c/o headache.  Vitals:   09/15/21 0530 09/15/21 0807 09/15/21 1006 09/15/21 1126  BP: (!) 165/103 (!) 174/115 (!) 150/103 (!) 193/99  Pulse: 75 82 85 85  Resp: 15 20 17 20   Temp:  98.1 F (36.7 C) 97.7 F (36.5 C) 98 F (36.7 C)  TempSrc:  Oral Oral Oral  SpO2: 97% 97% 100% 100%  Weight:      Height:       CBC:  Recent Labs  Lab 09/14/21 1949 09/15/21 0518  WBC 6.5 4.8  NEUTROABS 4.8 2.6  HGB 10.2* 9.4*  HCT 34.0* 31.8*  MCV 102.1* 101.9*  PLT 132* 245*   Basic Metabolic Panel:  Recent Labs  Lab 09/14/21 1949 09/15/21 0544  NA 140 139  K 5.1 5.1  CL 103 104  CO2 26 26  GLUCOSE 95 86  BUN 30* 38*  CREATININE 8.90* 9.71*  CALCIUM 8.7* 8.1*  PHOS  --  5.2*   Lipid Panel: No results for input(s): "CHOL", "TRIG", "HDL", "CHOLHDL", "VLDL", "LDLCALC" in the last 168 hours. HgbA1c: No results for input(s): "HGBA1C" in the last 168 hours. Urine Drug Screen:  Recent Labs  Lab 09/15/21 0037  LABOPIA NONE DETECTED  COCAINSCRNUR NONE DETECTED  LABBENZ NONE DETECTED  AMPHETMU NONE DETECTED  THCU NONE DETECTED  LABBARB NONE DETECTED    Alcohol Level  Recent Labs  Lab 09/14/21 1649  ETH <10    IMAGING past 24 hours MR BRAIN WO CONTRAST  Result Date: 09/15/2021 CLINICAL DATA:  Provided history: Headache, uncomplicated. Transient ischemic attack. EXAM: MRI HEAD WITHOUT CONTRAST MRA HEAD WITHOUT CONTRAST TECHNIQUE: Multiplanar, multi-echo pulse sequences of the brain and surrounding structures were acquired without intravenous contrast. Angiographic images of the Circle of Willis were acquired using MRA technique without intravenous contrast. COMPARISON:  Head CT 09/14/2021. MRI brain 03/20/2017. MR venogram head 04/07/2017. FINDINGS: MRI HEAD FINDINGS Mild intermittent motion degradation. Brain:  Susceptibility artifact arising from the external components of a right-sided shunt system, obscuring portions of the right cerebral hemisphere on multiple sequences. As before, a right frontoparietal approach ventricular catheter traverses the lateral ventricles, crossing midline and terminating in the region of the suprasellar cistern. Cerebral volume is normal. Within described limitations, there is no significant cerebral white matter disease. No appreciable acute infarct, intracranial mass, chronic intracranial blood products or extra-axial fluid collection. No midline shift or hydrocephalus. Vascular: Maintained flow voids within the proximal large arterial vessels. Skull and upper cervical spine: No focal suspicious marrow lesion. Sinuses/Orbits: No mass or acute finding within the imaged orbits. No significant paranasal sinus disease. MRA HEAD FINDINGS Anterior circulation: The intracranial internal carotid arteries are patent. The M1 middle cerebral arteries are patent. No M2 proximal branch occlusion or high-grade proximal stenosis. The anterior cerebral arteries are patent. No intracranial aneurysm is identified. Posterior circulation: The intracranial vertebral arteries are patent. The basilar artery is patent. The posterior cerebral arteries are patent. Hypoplastic P1 segments with sizable posterior communicating arteries, bilaterally. Anatomic variants: As described. IMPRESSION: MRI brain: 1. Artifact arising from the external components of a right-sided shunt system obscure portions of the right cerebral hemisphere on multiple sequences. Within this limitation, there is no evidence of acute intracranial abnormality. 2. Unchanged position of a right frontoparietal approach ventricular catheter as compared to the head CT of  09/14/2021. No evidence of hydrocephalus. MRA head: No intracranial large vessel occlusion or proximal high-grade arterial stenosis. Electronically Signed   By: Kellie Simmering D.O.    On: 09/15/2021 13:18   MR ANGIO HEAD WO CONTRAST  Result Date: 09/15/2021 CLINICAL DATA:  Provided history: Headache, uncomplicated. Transient ischemic attack. EXAM: MRI HEAD WITHOUT CONTRAST MRA HEAD WITHOUT CONTRAST TECHNIQUE: Multiplanar, multi-echo pulse sequences of the brain and surrounding structures were acquired without intravenous contrast. Angiographic images of the Circle of Willis were acquired using MRA technique without intravenous contrast. COMPARISON:  Head CT 09/14/2021. MRI brain 03/20/2017. MR venogram head 04/07/2017. FINDINGS: MRI HEAD FINDINGS Mild intermittent motion degradation. Brain: Susceptibility artifact arising from the external components of a right-sided shunt system, obscuring portions of the right cerebral hemisphere on multiple sequences. As before, a right frontoparietal approach ventricular catheter traverses the lateral ventricles, crossing midline and terminating in the region of the suprasellar cistern. Cerebral volume is normal. Within described limitations, there is no significant cerebral white matter disease. No appreciable acute infarct, intracranial mass, chronic intracranial blood products or extra-axial fluid collection. No midline shift or hydrocephalus. Vascular: Maintained flow voids within the proximal large arterial vessels. Skull and upper cervical spine: No focal suspicious marrow lesion. Sinuses/Orbits: No mass or acute finding within the imaged orbits. No significant paranasal sinus disease. MRA HEAD FINDINGS Anterior circulation: The intracranial internal carotid arteries are patent. The M1 middle cerebral arteries are patent. No M2 proximal branch occlusion or high-grade proximal stenosis. The anterior cerebral arteries are patent. No intracranial aneurysm is identified. Posterior circulation: The intracranial vertebral arteries are patent. The basilar artery is patent. The posterior cerebral arteries are patent. Hypoplastic P1 segments with sizable  posterior communicating arteries, bilaterally. Anatomic variants: As described. IMPRESSION: MRI brain: 1. Artifact arising from the external components of a right-sided shunt system obscure portions of the right cerebral hemisphere on multiple sequences. Within this limitation, there is no evidence of acute intracranial abnormality. 2. Unchanged position of a right frontoparietal approach ventricular catheter as compared to the head CT of 09/14/2021. No evidence of hydrocephalus. MRA head: No intracranial large vessel occlusion or proximal high-grade arterial stenosis. Electronically Signed   By: Kellie Simmering D.O.   On: 09/15/2021 13:18   CT HEAD CODE STROKE WO CONTRAST  Result Date: 09/14/2021 CLINICAL DATA:  Code stroke. Headache. Right-sided numbness to the face and arm. Slurred speech and confusion. EXAM: CT HEAD WITHOUT CONTRAST TECHNIQUE: Contiguous axial images were obtained from the base of the skull through the vertex without intravenous contrast. RADIATION DOSE REDUCTION: This exam was performed according to the departmental dose-optimization program which includes automated exposure control, adjustment of the mA and/or kV according to patient size and/or use of iterative reconstruction technique. COMPARISON:  CT head without contrast 11/05/2019 FINDINGS: Brain: A right frontal ventriculostomy catheter is in place and stable. Ventricles are of normal size. No acute infarct, hemorrhage, or mass lesion is present. Basal ganglia are within normal limits. Insular ribbon is normal. No acute or focal cortical abnormality is present. No significant extraaxial fluid collection is present. The brainstem and cerebellum are within normal limits. Vascular: No hyperdense vessel or unexpected calcification. Skull: Right frontal burr hole for ventriculostomy catheter present. Calvarium otherwise normal. No significant extracranial soft tissue lesion is present. Sinuses/Orbits: The paranasal sinuses and mastoid air  cells are clear. The globes and orbits are within normal limits. ASPECTS [facility] Stroke Program Early CT Score) - Ganglionic level infarction (caudate, lentiform nuclei, internal capsule, insula, M1-M3  cortex): 7/7 - Supraganglionic infarction (M4-M6 cortex): 3/3 Total score (0-10 with 10 being normal): 10/10 IMPRESSION: 1. Stable right frontal ventriculostomy catheter. 2. No acute intracranial abnormality or significant interval change. 3. Aspects 10/10 These results were called by telephone at the time of interpretation on 09/14/2021 at 7:15 pm to provider [facility] , who verbally acknowledged these results. Electronically Signed   By: San Morelle M.D.   On: 09/14/2021 19:16    PHYSICAL EXAM She is alert and oriented x 4, no dysarthria or slurred speech, no Facial droop. Able to name objects, repeat and follow commands. EOM intact, PERRL, visual fields full. Bilateral uppers no drift. Bilateral uppers unable to lift arms very high due to bilateral shoulder fx in 2021 and still with pain. No drift in lowers 5/5 bilaterally. Sensation normal bilaterally. No ataxia   ASSESSMENT/PLAN Anne Robinson is a 31 y.o. 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 Stable right frontal ventriculostomy catheter. 2. No acute intracranial abnormality or significant interval change. 3. Aspects 10/10 MRI  there is no evidence of acute intracranial abnormality. Unchanged position of a right frontoparietal approach ventricular catheter as compared to the head CT of 09/14/2021. No evidence of  hydrocephalus MRA No intracranial large vessel occlusion or proximal high-grade arterial stenosis VTE prophylaxis - SCD's aspirin 81 mg daily and Eliquis BID  prior to admission, now on home aspirin 81 and Eliquis.  Continue on discharge Received migraine cocktail in the ED, now on Tylenol and Fioricet as needed.  Continue as needed as outpatient. 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 Harvel Ricks @ [facility]. Shunt was dialed down to 120 mm H2O on 08/12/2021 due to persistent ongoing headaches.  Had MRI today, patient has appointment again tomorrow 1 PM, will need reprogram shunt s/p MRI.  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 Pt was seen and examined.   31 year old female with history of lupus, lupus nephritis on prednisone, ESRD on hemodialysis, seizure history in 02/2020 on Keppra, pseudotumor cerebri status post VP shunt, migraine, hypertension, history of subclavian thrombosis on Eliquis and aspirin admitted for severe headache 10/10, right facial and arm numbness.  Received migraine cocktail and Tylenol as needed.  Now headache 4/10, numbness resolved.  CT no acute abnormality.  MRI  and MRA negative.  UDS negative.  On exam, patient awake alert orientated x3, family at bedside.  Patient had mild headache but neurologically intact no focal deficit.  Etiology of patient headache likely migraine headache, she has appointment tomorrow with neurology office for follow-up visit of migraine treatment.  She also has a follow-up with neurosurgery tomorrow for VP shunt reprogramming, will ask for VP shunt checking after today's MRI also.  No need further stroke work-up at this time from neuro standpoint.  Okay to have outpatient short-term Fioricet as needed for headache relief.  Continue Eliquis and aspirin on discharge.  For detailed assessment and plan, please refer to above/below as I have made changes wherever appropriate.   Neurology will sign off. Please call with questions.   Rosalin Hawking, MD PhD Stroke Neurology 09/15/2021 6:51 PM    To contact Stroke Continuity provider, please refer to [email or web address] After hours, contact General Neurology

## 2021-09-15 NOTE — Plan of Care (Signed)
  Problem: Education: Goal: Knowledge of General Education information will improve Description: Including pain rating scale, medication(s)/side effects and non-pharmacologic comfort measures Outcome: Progressing   Problem: Health Behavior/Discharge Planning: Goal: Ability to manage health-related needs will improve Outcome: Progressing   Problem: Clinical Measurements: Goal: Ability to maintain clinical measurements within normal limits will improve Outcome: Progressing Goal: Will remain free from infection Outcome: Progressing Goal: Diagnostic test results will improve Outcome: Progressing Goal: Respiratory complications will improve Outcome: Progressing Goal: Cardiovascular complication will be avoided Outcome: Progressing   Problem: Activity: Goal: Risk for activity intolerance will decrease Outcome: Progressing   Problem: Nutrition: Goal: Adequate nutrition will be maintained Outcome: Progressing   Problem: Coping: Goal: Level of anxiety will decrease Outcome: Progressing   Problem: Elimination: Goal: Will not experience complications related to bowel motility Outcome: Progressing Goal: Will not experience complications related to urinary retention Outcome: Progressing   Problem: Pain Managment: Goal: General experience of comfort will improve Outcome: Progressing   Problem: Safety: Goal: Ability to remain free from injury will improve Outcome: Progressing   Problem: Skin Integrity: Goal: Risk for impaired skin integrity will decrease Outcome: Progressing   Problem: Education: Goal: Knowledge of secondary prevention will improve (SELECT ALL) Outcome: Progressing Goal: Knowledge of patient specific risk factors will improve (INDIVIDUALIZE FOR PATIENT) Outcome: Progressing   

## 2021-09-15 NOTE — H&P (Signed)
History and Physical    Patient: Anne Robinson:[id number] DOB: 1990-12-11 DOA: 09/14/2021 DOS: the patient was seen and examined on 09/15/2021 PCP: Karleen Hampshire., MD  Patient coming from: Transfer from [facility]  Chief Complaint:  Chief Complaint  Patient presents with   Headache   Numbness   HPI: Anne Robinson is a 31 y.o. 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  Patient reports that symptoms started around 5:30 PM yesterday afternoon.  She reported having numbness of the right side of her mouth, arm, and hand.  She reported that her mouth felt heavy and she was having some slurred speech with difficulty speaking at the time.  In addition to the numbness feelings she had some weakness in the right hand and noted some blurry vision.  At the time she reported having a headache with the discomfort behind both eyes and radiating to the left posterior aspect of her head.  She had tried taking ibuprofen without any improvement in symptoms.  Of note 1 week prior during dialysis she had experienced similar symptoms of the numbness feelings that resolved prior to her hemodialysis treatment being completed.  She has been compliant with dialysis, but is due to have it today.  She had already set up appointments with her neurologist due to the fact that she had been having some issues with headache over the last week or so.  In addition she was scheduled to see her neurosurgeon tomorrow as well.  Patient states that she has been on her menstrual cycle as a reason for blood present in her urine.  Patient denies having any chest pain, palpitations, nausea, vomiting, or diarrhea.  Upon admission into the emergency department patient  was noted to be afebrile with blood pressures elevated up to 193/99, and all other vital signs maintained.  Labs significant for hemoglobin 9.4, platelets 149, potassium 5.7, BUN 38, and creatinine 9.71.  UA significant for large hemoglobin with greater than 300 RBCs, many bacteria, 0-5 WBCs.  UDS and urine pregnancy screens were negative.  Given full dose aspirin and Compazine.  Review of Systems: As mentioned in the history of present illness. All other systems reviewed and are negative. Past Medical History:  Diagnosis Date   HTN (hypertension) 05/15/2016   Lupus ([place])    Lupus nephritis ([place])    Pseudotumor cerebri    Renal disorder    Thyroid disease    Past Surgical History:  Procedure Laterality Date   INSERTION OF DIALYSIS CATHETER     RENAL BIOPSY     SHOULDER SURGERY     VENTRICULAR ATRIAL SHUNT Right 11/27/2018   [employer] programmable shunt set at 150   Social History:  reports that she has never smoked. She has never used smokeless tobacco. She reports that she does not drink alcohol and does not use drugs.  Allergies  Allergen Reactions   Metoclopramide Itching, Other (See Comments) and Palpitations    Muscle twitching, restlesness Muscle twitching, restlesness Muscle twitching, restlesness Muscle twitching, restlesness    Lisinopril Swelling    No family history on file.  Prior to Admission medications   Medication Sig Start Date End Date Taking? Authorizing Provider  amLODipine (NORVASC) 10 MG tablet Take by mouth. 05/10/19  Yes [provider]  fluconazole (DIFLUCAN) 150  MG tablet Take 150 mg daily as needed by mouth (yeast infection).  01/11/17  Yes [provider]  fluticasone (FLONASE) 50 MCG/ACT nasal spray Place into the nose.   Yes [provider]  predniSONE (DELTASONE) 10 MG tablet Take 5 mg by mouth daily with breakfast. 05/20/19  Yes [provider]  calcitRIOL (ROCALTROL) 0.25 MCG capsule Patient no longer takes 06/09/19    [provider]  calcium carbonate (OS-CAL) 1250 (500 Ca) MG chewable tablet Chew by mouth.    [provider]  diazepam (VALIUM) 5 MG tablet Take 1 tablet at bedtime as needed for sleep. 12/07/19   Molpus, John, MD  EPINEPHrine 0.3 mg/0.3 mL IJ SOAJ injection Inject into the muscle. 07/12/13   [provider]  famotidine (PEPCID) 20 MG tablet Take by mouth. 01/29/18 09/14/19  [provider]  furosemide (LASIX) 20 MG tablet Take by mouth. 10/08/18   [provider]  HYDROcodone-acetaminophen (NORCO/VICODIN) 5-325 MG tablet Take by mouth. 01/27/19   [provider]  labetalol (NORMODYNE) 300 MG tablet Take 300 mg by mouth 3 (three) times daily. 06/07/19   [provider]  labetalol (NORMODYNE) 300 MG tablet Take by mouth. 05/01/18   [provider]  loratadine (CLARITIN) 10 MG tablet Take 10 mg by mouth daily as needed for allergies.     [provider]  mycophenolate (CELLCEPT) 500 MG tablet  02/12/18   [provider]  nebivolol (BYSTOLIC) 10 MG tablet  03/47/42   [provider]  pantoprazole (PROTONIX) 40 MG tablet Take by mouth. 08/29/19 08/28/20  [provider]  predniSONE (DELTASONE) 20 MG tablet Take 2 tablets (40 mg total) by mouth daily with breakfast. 01/29/17   Patrecia Pour, MD  temazepam (RESTORIL) 15 MG capsule Take by mouth. 01/21/19 12/07/19  [provider]    Physical Exam: Vitals:   09/15/21 0530 09/15/21 0807 09/15/21 1006 09/15/21 1126  BP: (!) 165/103 (!) 174/115 (!) 150/103 (!) 193/99  Pulse: 75 82 85 85  Resp: 15 20 17 20   Temp:  98.1 F (36.7 C) 97.7 F (36.5 C) 98 F (36.7 C)  TempSrc:  Oral Oral Oral  SpO2: 97% 97% 100% 100%  Weight:      Height:       Exam  Constitutional: Adult female currently no acute distress Eyes:  lids and conjunctivae normal.  Vision grossly intact ENMT: Mucous membranes are moist.  Normal dentition Neck: normal, supple.  No  JVD appreciated Respiratory: clear to auscultation bilaterally, no significant wheezes or rhonchi appreciated.  O2 saturation currently maintained on room air. Cardiovascular: Regular rate and rhythm, no murmurs / rubs / gallops.  Trace lower extremity edema.  Fistula graft noted of the left upper extremity which is larger than the left upper extremity and reported to be chronic Abdomen: no tenderness.  Bowel sounds positive.  Musculoskeletal: no clubbing / cyanosis. No joint deformity upper and lower extremities. Good ROM, no contractures. Normal muscle tone.  Skin: no rashes, lesions, ulcers. No induration Neurologic: CN 2-12 grossly intact. Sensation intact, DTR normal. Strength 5/5 in all 4.  Psychiatric: Normal judgment and insight. Alert and oriented x 3. Normal mood.   Data Reviewed:  EKG revealed normal sinus rhythm at 90 bpm with QTc 484.  Assessment and Plan: Stroke like symptoms Acute.  Patient presented with complaints of right-sided mouth and upper extremity numbness, headache, blurred vision, and slurred speech starting yesterday afternoon.  Initial CT scan of the head  noted stable right frontal ventriculostomy catheter without any acute abnormality.  Neurology had been consulted and recommended further work-up with MRI. -Admit to a telemetry bed -Neurochecks -Follow-up MRI of the brain -Appreciate neurology consultative services, we will follow-up for any further recommendations  ESRD on HD secondary to lupus nephritis Patient normally dialyzes Monday, Wednesday, Friday.  She is due for dialysis today.  Labs noted potassium 5.1, BUN 38, creatinine 9.71, and phosphorus 5.2. -Nephrology consulted, we will follow-up for any further recommendation  Idiopathic intracranial hypertension s/p VP shunt with seizure disorder Patient has outpatient follow-up appointment with neurosurgery -Continue Keppra  Lupus with immunosuppression due to chronic steroid use -Continue  prednisone  Anemia of chronic kidney disease Hemoglobin 10.2-> 9.4 g/dL which appears around her baseline.  She is on her menstrual cycle but denies any other bleeding. -Continue to monitor  Hematuria Urinalysis positive for blood along with many bacteria.  Patient reports that she is currently on her menstrual cycle is the likely cause for having blood present in her urine. -Check urine culture  Essential hypertension -Resume amlodipine, hydralazine, and metoprolol when medically appropriate  History of DVT on chronic anticoagulation Patient reports prior history of blood clots on 2 separate places -Continue Eliquis  Thrombocytopenia Chronic.  Platelet count 149 which appears hard of prior. -Continue to monitor  Hypothyroidism -Continue levothyroxine  Hyperlipidemia -Continue atorvastatin  GERD -Continue Protonix  DVT prophylaxis: Eliquis Advance Care Planning:   Code Status: Full Code   Consults: Neurology and nephrology  Family Communication: Family updated at bedside Severity of Illness: The appropriate patient status for this patient is OBSERVATION. Observation status is judged to be reasonable and necessary in order to provide the required intensity of service to ensure the patient's safety. The patient's presenting symptoms, physical exam findings, and initial radiographic and laboratory data in the context of their medical condition is felt to place them at decreased risk for further clinical deterioration. Furthermore, it is anticipated that the patient will be medically stable for discharge from the hospital within 2 midnights of admission.   Author: Norval Morton, MD 09/15/2021 11:43 AM  For on call review [email or web address].

## 2021-09-15 NOTE — Progress Notes (Signed)
MD Tamala Julian notified of pt's BP.

## 2021-09-15 NOTE — Progress Notes (Signed)
Pt came back from MRI, MD notified; also notified of patient's latest BP.

## 2021-09-16 ENCOUNTER — Ambulatory Visit: Payer: Medicare Other | Admitting: Physical Therapy

## 2021-09-16 DIAGNOSIS — G43109 Migraine with aura, not intractable, without status migrainosus: Secondary | ICD-10-CM | POA: Diagnosis not present

## 2021-09-16 LAB — RENAL FUNCTION PANEL
Albumin: 3.2 g/dL — ABNORMAL LOW (ref 3.5–5.0)
Anion gap: 9 (ref 5–15)
BUN: 11 mg/dL (ref 6–20)
CO2: 31 mmol/L (ref 22–32)
Calcium: 8.5 mg/dL — ABNORMAL LOW (ref 8.9–10.3)
Chloride: 97 mmol/L — ABNORMAL LOW (ref 98–111)
Creatinine, Ser: 3.4 mg/dL — ABNORMAL HIGH (ref 0.44–1.00)
GFR, Estimated: 18 mL/min — ABNORMAL LOW
Glucose, Bld: 101 mg/dL — ABNORMAL HIGH (ref 70–99)
Phosphorus: 2.3 mg/dL — ABNORMAL LOW (ref 2.5–4.6)
Potassium: 3.2 mmol/L — ABNORMAL LOW (ref 3.5–5.1)
Sodium: 137 mmol/L (ref 135–145)

## 2021-09-16 LAB — HEPATITIS B SURFACE ANTIBODY, QUANTITATIVE: Hep B S AB Quant (Post): 4.1 m[IU]/mL — ABNORMAL LOW

## 2021-09-16 MED ORDER — DIPHENHYDRAMINE HCL 25 MG PO CAPS
25.0000 mg | ORAL_CAPSULE | Freq: Once | ORAL | Status: AC
  Administered 2021-09-16: 25 mg via ORAL
  Filled 2021-09-16: qty 1

## 2021-09-16 MED ORDER — PROCHLORPERAZINE EDISYLATE 10 MG/2ML IJ SOLN
10.0000 mg | Freq: Once | INTRAMUSCULAR | Status: AC
Start: 2021-09-16 — End: 2021-09-16
  Administered 2021-09-16: 10 mg via INTRAVENOUS
  Filled 2021-09-16: qty 2

## 2021-09-16 NOTE — Progress Notes (Signed)
Pt arrived back to the unit for dialysis, Dr. Tamala Julian had placed D/C orders for when the pt got back from dialysis, the pt stated she is not able to leave at this time due to her not having a ride being that it is so late, night provider notified.

## 2021-09-16 NOTE — Progress Notes (Addendum)
Received patient in bed, alert and oriented. Informed consent signed and in chart.  Time tx completed:0037H  HD treatment completed. Patient moderately tolerated. Complained of headache and nauseous but VS are stable. UF goal not met. Graft without signs and symptoms of complications. Patient transported back to the room, alert and orient and in no acute distress. Education given. Report given to bedside RN.  Total UF removed:1200ml  Medication given: Zofran 4mg  Post HD VS: BP 137/96mmHg, HR 93bpm, Sats 100% on RA, RR 19 brpm, T-98.3f  Post HD weight: 74.8kg 

## 2021-09-16 NOTE — Progress Notes (Signed)
Patient with discharged order but unable to leave due to late return from HD session at 0100. Patient asked if she can leave at 0900am this morning during bedside shift report. Patient's parent will provide transportation to disposition.

## 2021-09-16 NOTE — Progress Notes (Signed)
Discharged yesterday by Dr. Tamala Julian. C/o headache this morning, offered HA cocktail prior to d/c, she was agreeable. Planning to follow up with neurology today outpatient.  Planning to follow up with nsgy as well.   See Dr. Thompson Caul note for details regarding d/c.  Vitals:   09/16/21 0607 09/16/21 0800  BP: 113/80 127/86  Pulse: 80 80  Resp: 16 16  Temp: 98.9 F (37.2 C) (!) 97.5 F (36.4 C)  SpO2: 100% 98%   NAD Walking around the room No focal deficits appreciated

## 2021-09-16 NOTE — Plan of Care (Signed)
Patient declined AM med. AVS reviewed with patient. No other questions voiced at this time. Family is providing transportation to disposition.   Problem: Education: Goal: Knowledge of General Education information will improve Description: Including pain rating scale, medication(s)/side effects and non-pharmacologic comfort measures Outcome: Adequate for Discharge   Problem: Health Behavior/Discharge Planning: Goal: Ability to manage health-related needs will improve Outcome: Adequate for Discharge   Problem: Clinical Measurements: Goal: Ability to maintain clinical measurements within normal limits will improve Outcome: Adequate for Discharge Goal: Will remain free from infection Outcome: Adequate for Discharge Goal: Diagnostic test results will improve Outcome: Adequate for Discharge Goal: Respiratory complications will improve Outcome: Adequate for Discharge Goal: Cardiovascular complication will be avoided Outcome: Adequate for Discharge

## 2021-09-16 NOTE — TOC Transition Note (Signed)
Transition of Care [facility]) - CM/SW Discharge Note   Patient Details  Name: Anne Robinson MRN: [id number] Date of Birth: 1990/06/24  [facility] of [facility]) CM/SW Contact:  Pollie Friar, RN Phone Number: 09/16/2021, 9:47 AM   Clinical Narrative:    Pt discharging home with self care. No needs per TOC.   Final next level of care: Home/Self Care Barriers to Discharge: No Barriers Identified   Patient Goals and CMS Choice        Discharge Placement                       Discharge Plan and Services                                     Social Determinants of Health (SDOH) Interventions     Readmission Risk Interventions     No data to display

## 2021-09-17 LAB — HEPATITIS B SURFACE ANTIBODY, QUANTITATIVE: Hep B S AB Quant (Post): <3.1 m[IU]/mL — ABNORMAL LOW (ref >9.9)

## 2021-09-28 ENCOUNTER — Encounter: Payer: Self-pay | Admitting: Physical Therapy

## 2021-09-28 ENCOUNTER — Ambulatory Visit: Payer: Medicare Other | Admitting: Physical Therapy

## 2021-09-28 DIAGNOSIS — M25611 Stiffness of right shoulder, not elsewhere classified: Secondary | ICD-10-CM

## 2021-09-28 DIAGNOSIS — R262 Difficulty in walking, not elsewhere classified: Secondary | ICD-10-CM

## 2021-09-28 DIAGNOSIS — R29898 Other symptoms and signs involving the musculoskeletal system: Secondary | ICD-10-CM

## 2021-09-28 DIAGNOSIS — R2681 Unsteadiness on feet: Secondary | ICD-10-CM

## 2021-09-28 DIAGNOSIS — M6281 Muscle weakness (generalized): Secondary | ICD-10-CM | POA: Diagnosis not present

## 2021-09-28 DIAGNOSIS — M25612 Stiffness of left shoulder, not elsewhere classified: Secondary | ICD-10-CM

## 2021-09-28 NOTE — Therapy (Signed)
[place]. [place], [name], [address] Phone: [phone]   Fax:  [phone]  Physical Therapy Treatment  Patient Details  Name: Anne Robinson MRN: [id number] Date of Birth: June 09, 1990 Referring Provider (PT): Marita Kansas   Encounter Date: 09/28/2021   PT End of Session - 09/28/21 1531     Visit Number 18    Date for PT Re-Evaluation 11/02/21    Authorization Type MCR and UHC    PT Start Time 1528    PT Stop Time 1611    PT Time Calculation (min) 43 min    Activity Tolerance Patient tolerated treatment well    Behavior During Therapy [facility] for tasks assessed/performed             Past Medical History:  Diagnosis Date   HTN (hypertension) 05/15/2016   Lupus ([place])    Lupus nephritis ([place])    Pseudotumor cerebri    Renal disorder    Thyroid disease     Past Surgical History:  Procedure Laterality Date   INSERTION OF DIALYSIS CATHETER     RENAL BIOPSY     SHOULDER SURGERY     VENTRICULAR ATRIAL SHUNT Right 11/27/2018   Codman programmable shunt set at 150    There were no vitals filed for this visit.   Subjective Assessment - 09/28/21 1535     Subjective Doing okay, no issues.    Currently in Pain? No/denies                [facility] PT Assessment - 09/28/21 0001       PROM   Right Shoulder Flexion 138 Degrees    Right Shoulder ABduction 120 Degrees    Right Shoulder Internal Rotation 90 Degrees    Right Shoulder External Rotation 80 Degrees    Left Shoulder External Rotation 60 Degrees                           [facility] Adult PT Treatment/Exercise - 09/28/21 0001       Lumbar Exercises: Aerobic   Nustep L3 x 3 min      Shoulder Exercises: Supine   Other Supine Exercises ER/IR 2lb x10, isometric circles without weight very difficult    Other Supine Exercises Flex & Chest press with 2# and 3# bar, then 1# bar flexion      Shoulder Exercises: Seated    Extension Both;20 reps;Theraband    Theraband Level (Shoulder Extension) Level 2 (Red)    Row Both;20 reps;Theraband    Theraband Level (Shoulder Row) Level 2 (Red)    External Rotation Both;20 reps;Theraband    Theraband Level (Shoulder External Rotation) Level 1 (Yellow)    Internal Rotation Strengthening;Both;Theraband;20 reps    Theraband Level (Shoulder Internal Rotation) Level 1 (Yellow)    Other Seated Exercises 1# reaching to shoulder height and then across body    Other Seated Exercises bicep curls 1x15 B 3#; forward punches 1x10 3# L LUE      Shoulder Exercises: Sidelying   External Rotation Right;10 reps;Weights    External Rotation Weight (lbs) 1      Shoulder Exercises: ROM/Strengthening   UBE (Upper Arm Bike) L1 x75mn each forward/backwards      Manual Therapy   Soft tissue mobilization R anterior shoulder STM    Passive ROM R shoulder GH grade II mobs mixed with PROM  PT Short Term Goals - 09/02/21 1030       PT SHORT TERM GOAL #2   Title Pain to be no more than 4/10 in B shoulders               PT Long Term Goals - 09/28/21 1615       PT LONG TERM GOAL #1   Title MMT to have improved by at least 1 grade in all weak groups    Status Partially Met      PT LONG TERM GOAL #2   Title B shoulder flexion AROM to be at least 100 degrees in sitting, ABD AROM to be at least 90 degrees in sitting    Status Partially Met                   Plan - 09/28/21 1615     Clinical Impression Statement Patient still with very limited functional strengtha nd ROm of the shoulders, there is improvement but there is a lot of compensation as well.  She does have popping and some pain with the activities, trying to do supine and sidelying first and build to the sitting and standing to be more functional    PT Next Visit Plan Bilateral shoulder ROM and strength    Consulted and Agree with Plan of Care Patient              Patient will benefit from skilled therapeutic intervention in order to improve the following deficits and impairments:  Decreased range of motion, Increased fascial restricitons, Increased muscle spasms, Impaired UE functional use, Decreased activity tolerance, Pain, Hypomobility, Impaired flexibility, Improper body mechanics, Decreased mobility, Decreased strength, Postural dysfunction  Visit Diagnosis: Muscle weakness (generalized)  Stiffness of right shoulder, not elsewhere classified  Stiffness of left shoulder, not elsewhere classified  Other symptoms and signs involving the musculoskeletal system  Difficulty walking  Unsteady gait     Problem List Patient Active Problem List   Diagnosis Date Noted   ESRD on dialysis ([place]) 09/15/2021   Idiopathic intracranial hypertension 09/15/2021   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   Anemia of chronic renal failure 09/15/2021   History of DVT (deep vein thrombosis) 09/15/2021   Thrombocytopenia ([place]) 09/15/2021   Hypothyroidism 09/15/2021   S/P VP shunt 35/32/9924   Complicated migraine 26/83/4196   Cellulitis of chest wall    Acute renal failure ([place]) 01/26/2017   Exacerbation of systemic lupus erythematosus ([place]) 01/26/2017   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 [place], [name], [address] Phone: [phone]   Fax:  [phone]  Name: Anne Robinson MRN: [id number] Date of Birth: 09-19-1990

## 2021-09-30 ENCOUNTER — Encounter: Payer: Self-pay | Admitting: Physical Therapy

## 2021-09-30 ENCOUNTER — Ambulatory Visit: Payer: Medicare Other | Admitting: Physical Therapy

## 2021-09-30 DIAGNOSIS — M25611 Stiffness of right shoulder, not elsewhere classified: Secondary | ICD-10-CM

## 2021-09-30 DIAGNOSIS — M6281 Muscle weakness (generalized): Secondary | ICD-10-CM

## 2021-09-30 DIAGNOSIS — M25612 Stiffness of left shoulder, not elsewhere classified: Secondary | ICD-10-CM

## 2021-09-30 DIAGNOSIS — R29898 Other symptoms and signs involving the musculoskeletal system: Secondary | ICD-10-CM

## 2021-09-30 NOTE — Therapy (Signed)
[place]. [place], [name], [address] Phone: [phone]   Fax:  [phone]  Physical Therapy Treatment  Patient Details  Name: Anne Robinson MRN: [id number] Date of Birth: [birth year] Referring Provider (PT): Marita Kansas   Encounter Date: 09/30/2021   PT End of Session - 09/30/21 1642     Visit Number 19    Date for PT Re-Evaluation 11/02/21    PT Start Time 1600    PT Stop Time 1642    PT Time Calculation (min) 42 min    Activity Tolerance Patient tolerated treatment well    Behavior During Therapy [facility] for tasks assessed/performed             Past Medical History:  Diagnosis Date   HTN (hypertension) 05/15/2016   Lupus ([place])    Lupus nephritis ([place])    Pseudotumor cerebri    Renal disorder    Thyroid disease     Past Surgical History:  Procedure Laterality Date   INSERTION OF DIALYSIS CATHETER     RENAL BIOPSY     SHOULDER SURGERY     VENTRICULAR ATRIAL SHUNT Right 11/27/2018   Codman programmable shunt set at 150    There were no vitals filed for this visit.   Subjective Assessment - 09/30/21 1556     Subjective "Its ok"    Currently in Pain? Yes    Pain Score 4     Pain Location Shoulder    Pain Orientation Right                               [facility] Adult PT Treatment/Exercise - 09/30/21 0001       Lumbar Exercises: Aerobic   Nustep L2 x x3 min each      Shoulder Exercises: Supine   Other Supine Exercises ER/IR 1lb x10      Shoulder Exercises: Standing   Extension Strengthening;Both;20 reps;Theraband    Theraband Level (Shoulder Extension) Level 1 (Yellow)    Other Standing Exercises Shoulder flex x5, eccentrics 2x5, 2lb WaTE bar chest press 2x10    Other Standing Exercises Shoulder abd x5, eccentrics 2x5; Biceps Curls 2lb 2x10      Shoulder Exercises: Power Futures trader Rows 25lb 3x10      Manual Therapy   Soft tissue  mobilization R anterior shoulder STM    Passive ROM R shoulder GH grade II mobs mixed with PROM                       PT Short Term Goals - 09/02/21 1030       PT SHORT TERM GOAL #2   Title Pain to be no more than 4/10 in B shoulders               PT Long Term Goals - 09/28/21 1615       PT LONG TERM GOAL #1   Title MMT to have improved by at least 1 grade in all weak groups    Status Partially Met      PT LONG TERM GOAL #2   Title B shoulder flexion AROM to be at least 100 degrees in sitting, ABD AROM to be at least 90 degrees in sitting    Status Partially Met  Plan - 09/30/21 1645     Clinical Impression Statement Pt enters with reports of feeling well. Pt remains very limited with UE function due to decrease ARM and strength. Some popping and grinding of R shoulder with PROM. Postural compensation noted with interventions. Eccentric intervention were very taxing on Pt.    Comorbidities ESRD, HTN, Lupus    Examination-Activity Limitations Bathing;Reach Overhead;Caring for Others;Sleep;Carry;Hygiene/Grooming;Lift    Examination-Participation Restrictions Cleaning;Community Activity;Driving    Rehab Potential Good    PT Frequency 2x / week    PT Duration Other (comment)    PT Treatment/Interventions ADLs/Self Care Home Management;Electrical Stimulation;Moist Heat;Therapeutic exercise;Therapeutic activities;Functional mobility training;Cryotherapy;Ultrasound;Iontophoresis 78m/ml Dexamethasone;Patient/family education;Manual techniques;Passive range of motion;Dry needling;Energy conservation;Taping;Vasopneumatic Device    PT Next Visit Plan Bilateral shoulder ROM and strength             Patient will benefit from skilled therapeutic intervention in order to improve the following deficits and impairments:  Decreased range of motion, Increased fascial restricitons, Increased muscle spasms, Impaired UE functional use, Decreased  activity tolerance, Pain, Hypomobility, Impaired flexibility, Improper body mechanics, Decreased mobility, Decreased strength, Postural dysfunction  Visit Diagnosis: Muscle weakness (generalized)  Stiffness of right shoulder, not elsewhere classified  Stiffness of left shoulder, not elsewhere classified  Other symptoms and signs involving the musculoskeletal system     Problem List Patient Active Problem List   Diagnosis Date Noted   ESRD on dialysis ([place] 09/15/2021   Idiopathic intracranial hypertension 09/15/2021   Immunosuppression due to chronic steroid use ([place] 09/15/2021   Anemia of chronic renal failure 09/15/2021   History of DVT (deep vein thrombosis) 09/15/2021   Thrombocytopenia ([place] 09/15/2021   Hypothyroidism 09/15/2021   S/P VP shunt 016/38/4665  Complicated migraine 099/35/7017  Cellulitis of chest wall    Acute renal failure ([place] 01/26/2017   Exacerbation of systemic lupus erythematosus ([place] 01/26/2017   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

## 2021-10-05 ENCOUNTER — Ambulatory Visit: Payer: Medicare Other | Admitting: Physical Therapy

## 2021-10-07 ENCOUNTER — Ambulatory Visit: Payer: Medicare Other | Admitting: Physical Therapy

## 2021-10-12 ENCOUNTER — Ambulatory Visit: Payer: Medicare Other | Admitting: Physical Therapy

## 2021-10-14 ENCOUNTER — Ambulatory Visit: Payer: Medicare Other | Admitting: Physical Therapy

## 2021-10-19 ENCOUNTER — Ambulatory Visit: Payer: Medicare Other | Admitting: Physical Therapy

## 2021-10-21 ENCOUNTER — Ambulatory Visit: Payer: Medicare Other | Admitting: Physical Therapy

## 2022-04-21 ENCOUNTER — Encounter (HOSPITAL_BASED_OUTPATIENT_CLINIC_OR_DEPARTMENT_OTHER): Payer: Self-pay | Admitting: Emergency Medicine

## 2022-04-21 ENCOUNTER — Emergency Department (HOSPITAL_BASED_OUTPATIENT_CLINIC_OR_DEPARTMENT_OTHER)
Admission: EM | Admit: 2022-04-21 | Discharge: 2022-04-21 | Disposition: A | Discharge status: Home / Self Care | Payer: Medicare HMO | Attending: Emergency Medicine | Admitting: Emergency Medicine

## 2022-04-21 ENCOUNTER — Other Ambulatory Visit: Payer: Self-pay

## 2022-04-21 ENCOUNTER — Emergency Department (HOSPITAL_BASED_OUTPATIENT_CLINIC_OR_DEPARTMENT_OTHER): Payer: Medicare HMO

## 2022-04-21 DIAGNOSIS — Z7901 Long term (current) use of anticoagulants: Secondary | ICD-10-CM | POA: Insufficient documentation

## 2022-04-21 DIAGNOSIS — M25511 Pain in right shoulder: Secondary | ICD-10-CM | POA: Insufficient documentation

## 2022-04-21 DIAGNOSIS — G8929 Other chronic pain: Secondary | ICD-10-CM | POA: Diagnosis not present

## 2022-04-21 DIAGNOSIS — N186 End stage renal disease: Secondary | ICD-10-CM | POA: Insufficient documentation

## 2022-04-21 DIAGNOSIS — Z992 Dependence on renal dialysis: Secondary | ICD-10-CM | POA: Insufficient documentation

## 2022-04-21 DIAGNOSIS — Z79899 Other long term (current) drug therapy: Secondary | ICD-10-CM | POA: Diagnosis not present

## 2022-04-21 DIAGNOSIS — Z7982 Long term (current) use of aspirin: Secondary | ICD-10-CM | POA: Diagnosis not present

## 2022-04-21 DIAGNOSIS — M25512 Pain in left shoulder: Secondary | ICD-10-CM | POA: Diagnosis not present

## 2022-04-21 MED ORDER — HYDROCODONE-ACETAMINOPHEN 5-325 MG PO TABS: 1.0000 | ORAL_TABLET | Freq: Four times a day (QID) | ORAL | 0 refills | Status: DC | PRN

## 2022-04-21 MED ORDER — HYDROCODONE-ACETAMINOPHEN 5-325 MG PO TABS
2.0000 | ORAL_TABLET | Freq: Once | ORAL | Status: AC
  Administered 2022-04-21: 2 via ORAL
  Filled 2022-04-21: qty 2

## 2022-04-21 MED ORDER — OXYCODONE-ACETAMINOPHEN 5-325 MG PO TABS: 1.0000 | ORAL_TABLET | Freq: Once | ORAL | Status: DC

## 2022-04-21 NOTE — ED Provider Notes (Signed)
[place] EMERGENCY DEPARTMENT AT [place] [facility] [place]  Provider Note  CSN: [id number] Arrival date & time: 04/21/22 0148  History Chief Complaint  Patient presents with   Shoulder Pain    Anne Robinson is a 32 y.o. female with complex PMH of SLE, ESRD on HD, and prior bilateral shoulder ORIF after fractures sustained during a seizure in 2022, repaired at [facility]. She was admitted at [facility] about a month ago for septic shock from infected dialysis catheter. She presents today for evaluation of bilateral R>L shoulder pain worse with movement. No recent falls or injuries. She is currently being re-evaluated at [facility] for possible shoulder replacement due to persistent pain. She has tried taking APAP and Motrin without improvement.    Home Medications Prior to Admission medications   Medication Sig Start Date End Date Taking? Authorizing Provider  HYDROcodone-acetaminophen (NORCO/VICODIN) 5-325 MG tablet Take 1-2 tablets by mouth every 6 (six) hours as needed for severe pain. 04/21/22  Yes Truddie Hidden, MD  amLODipine (NORVASC) 5 MG tablet Take 5 mg by mouth daily. 09/12/21   [provider]  aspirin 81 MG chewable tablet Chew 81 mg by mouth daily. 07/11/21   [provider]  atorvastatin (LIPITOR) 40 MG tablet Take 40 mg by mouth daily. 07/11/21   [provider]  butalbital-acetaminophen-caffeine (FIORICET) 50-325-40 MG tablet Take 1 tablet by mouth every 6 (six) hours as needed for headache. 09/15/21   Norval Morton, MD  calcium acetate (PHOSLO) 667 MG capsule Take 667 mg by mouth 3 (three) times daily. 07/22/21   [provider]  cetirizine (ZYRTEC) 10 MG tablet Take 10 mg by mouth daily. 08/14/21   [provider]  ELIQUIS 5 MG TABS tablet Take 5 mg by mouth 2 (two) times daily. 07/11/21   [provider]  fluticasone (FLONASE) 50 MCG/ACT nasal spray Place 1 spray into both nostrils daily as needed for allergies.    [provider]  hydrALAZINE (APRESOLINE) 25 MG tablet Take 25 mg by mouth 3 (three) times daily. 09/13/21   [provider]  ibuprofen (ADVIL) 200 MG tablet Take 800 mg by mouth every 6 (six) hours as needed for headache.    [provider]  levETIRAcetam (KEPPRA) 500 MG tablet Take 500 mg by mouth daily. 08/12/21   [provider]  levothyroxine (SYNTHROID) 50 MCG tablet Take 50 mcg by mouth daily. 09/04/21   [provider]  metoprolol succinate (TOPROL-XL) 50 MG 24 hr tablet Take 50 mg by mouth 2 (two) times daily. 07/11/21   [provider]  pantoprazole (PROTONIX) 40 MG tablet Take 40 mg by mouth daily. 08/29/19 11/13/21  [provider]  predniSONE (DELTASONE) 5 MG tablet Take 5 mg by mouth daily with breakfast.    [provider]  tiZANidine (ZANAFLEX) 4 MG tablet Take 4 mg by mouth at bedtime. 09/13/21   [provider]  traZODone (DESYREL) 50 MG tablet Take 25 mg by mouth at bedtime as needed for sleep. 08/14/21   [provider]  temazepam (RESTORIL) 15 MG capsule Take by mouth. 01/21/19 12/07/19  [provider]     Allergies    Reglan [metoclopramide] and Zestril [lisinopril]   Review of Systems   Review of Systems Please see HPI for pertinent positives and negatives  Physical Exam BP (!) 164/128 (BP Location: Right Wrist)   Pulse 61   Temp 98.3 F (36.8 C) (Oral)   Resp 20   Ht  $4' 11"x$  (1.499 m)   Wt 79 kg   SpO2 99%   BMI 35.18 kg/m   Physical Exam Vitals and nursing note reviewed.  Constitutional:      Appearance: Normal appearance.  HENT:     Head: Normocephalic and atraumatic.     Nose: Nose normal.     Mouth/Throat:     Mouth: Mucous membranes are moist.  Eyes:     Extraocular Movements: Extraocular movements intact.     Conjunctiva/sclera: Conjunctivae normal.  Cardiovascular:     Rate and Rhythm: Normal rate.     Pulses: Normal pulses.  Pulmonary:     Effort: Pulmonary effort is  normal.     Breath sounds: Normal breath sounds.  Abdominal:     General: Abdomen is flat.     Palpations: Abdomen is soft.     Tenderness: There is no abdominal tenderness.  Musculoskeletal:        General: Tenderness (bilateral shoulder, R>L mostly posterior) present. No swelling or deformity.     Cervical back: Neck supple.  Skin:    General: Skin is warm and dry.  Neurological:     General: No focal deficit present.     Mental Status: She is alert.  Psychiatric:        Mood and Affect: Mood normal.     ED Results / Procedures / Treatments   EKG None  Procedures Procedures  Medications Ordered in the ED Medications  HYDROcodone-acetaminophen (NORCO/VICODIN) 5-325 MG per tablet 2 tablet (2 tablets Oral Given 04/21/22 0220)    Initial Impression and Plan  Patient with acute on chronic bilateral shoulder pain. Will check xrays to evaluate hardware complication. Norco for pain. Otherwise she is well appearing with reassuring vitals.   ED Course   Clinical Course as of 04/21/22 0308  Thu Apr 21, 2022  0304 I personally viewed the images from radiology studies and agree with radiologist interpretation:  Xrays show prior ORIF. R shoulder with some subluxation, not in need of emergent intervention but likely the cause of her increased pain. As above, she is currently being managed by Ortho at [facility] and will call them to make a follow up appointment. Sling for comfort and Rx for norco in the meantime.  [CS]    Clinical Course User Index [CS] Truddie Hidden, MD     MDM Rules/Calculators/A&P Medical Decision Making Problems Addressed: Chronic pain of both shoulders: chronic illness or injury with exacerbation, progression, or side effects of treatment  Amount and/or Complexity of Data Reviewed Radiology: ordered and independent interpretation performed. Decision-making details documented in ED Course.  Risk Prescription drug management.     Final Clinical  Impression(s) / ED Diagnoses Final diagnoses:  Chronic pain of both shoulders    Rx / DC Orders ED Discharge Orders          Ordered    HYDROcodone-acetaminophen (NORCO/VICODIN) 5-325 MG tablet  Every 6 hours PRN        04/21/22 0307             Truddie Hidden, MD 04/21/22 [phone]

## 2022-04-21 NOTE — ED Notes (Signed)
Pt c/o bilateral shoulder pain, denies any injury Hx of shoulder fx in the past

## 2022-04-21 NOTE — ED Notes (Signed)
Patient transported to X-ray 

## 2022-04-21 NOTE — ED Triage Notes (Signed)
Patient arrived via POV c/o shoulder pain x 5 days. Patient states hx of fx to both shoulders. Patient states increasing pain over last 5 days. Patient took over the counter medications with no relief. Patient is AO x 45, VS w/ elevate BP, normal gait.

## 2022-05-01 ENCOUNTER — Emergency Department (HOSPITAL_BASED_OUTPATIENT_CLINIC_OR_DEPARTMENT_OTHER): Payer: Medicare HMO

## 2022-05-01 ENCOUNTER — Encounter (HOSPITAL_BASED_OUTPATIENT_CLINIC_OR_DEPARTMENT_OTHER): Payer: Self-pay | Admitting: Emergency Medicine

## 2022-05-01 ENCOUNTER — Other Ambulatory Visit: Payer: Self-pay

## 2022-05-01 ENCOUNTER — Emergency Department (HOSPITAL_BASED_OUTPATIENT_CLINIC_OR_DEPARTMENT_OTHER)
Admission: EM | Admit: 2022-05-01 | Discharge: 2022-05-02 | Disposition: A | Discharge status: Home / Self Care | Payer: Medicare HMO | Attending: Emergency Medicine | Admitting: Emergency Medicine

## 2022-05-01 DIAGNOSIS — I120 Hypertensive chronic kidney disease with stage 5 chronic kidney disease or end stage renal disease: Secondary | ICD-10-CM | POA: Insufficient documentation

## 2022-05-01 DIAGNOSIS — R519 Headache, unspecified: Secondary | ICD-10-CM | POA: Insufficient documentation

## 2022-05-01 DIAGNOSIS — R110 Nausea: Secondary | ICD-10-CM | POA: Insufficient documentation

## 2022-05-01 DIAGNOSIS — Z7982 Long term (current) use of aspirin: Secondary | ICD-10-CM | POA: Diagnosis not present

## 2022-05-01 DIAGNOSIS — Z992 Dependence on renal dialysis: Secondary | ICD-10-CM | POA: Diagnosis not present

## 2022-05-01 DIAGNOSIS — Z79899 Other long term (current) drug therapy: Secondary | ICD-10-CM | POA: Diagnosis not present

## 2022-05-01 DIAGNOSIS — E039 Hypothyroidism, unspecified: Secondary | ICD-10-CM | POA: Insufficient documentation

## 2022-05-01 DIAGNOSIS — N186 End stage renal disease: Secondary | ICD-10-CM | POA: Insufficient documentation

## 2022-05-01 DIAGNOSIS — R1013 Epigastric pain: Secondary | ICD-10-CM | POA: Diagnosis present

## 2022-05-01 LAB — CBC WITH DIFFERENTIAL/PLATELET
Abs Immature Granulocytes: 0.12 10*3/uL — ABNORMAL HIGH (ref 0.00–0.07)
Basophils Absolute: 0 10*3/uL (ref 0.0–0.1)
Basophils Relative: 0 %
Eosinophils Absolute: 0.2 10*3/uL (ref 0.0–0.5)
Eosinophils Relative: 3 %
HCT: 34.9 % — ABNORMAL LOW (ref 36.0–46.0)
Hemoglobin: 10.3 g/dL — ABNORMAL LOW (ref 12.0–15.0)
Immature Granulocytes: 2 %
Lymphocytes Relative: 30 %
Lymphs Abs: 2.1 10*3/uL (ref 0.7–4.0)
MCH: 30.8 pg (ref 26.0–34.0)
MCHC: 29.5 g/dL — ABNORMAL LOW (ref 30.0–36.0)
MCV: 104.5 fL — ABNORMAL HIGH (ref 80.0–100.0)
Monocytes Absolute: 0.5 10*3/uL (ref 0.1–1.0)
Monocytes Relative: 7 %
Neutro Abs: 4 10*3/uL (ref 1.7–7.7)
Neutrophils Relative %: 58 %
Platelets: 240 10*3/uL (ref 150–400)
RBC: 3.34 MIL/uL — ABNORMAL LOW (ref 3.87–5.11)
RDW: 20.1 % — ABNORMAL HIGH (ref 11.5–15.5)
WBC: 7 10*3/uL (ref 4.0–10.5)
nRBC: 1.1 % — ABNORMAL HIGH (ref 0.0–0.2)

## 2022-05-01 LAB — COMPREHENSIVE METABOLIC PANEL
ALT: 15 U/L (ref 0–44)
AST: 21 U/L (ref 15–41)
Albumin: 3.5 g/dL (ref 3.5–5.0)
Alkaline Phosphatase: 69 U/L (ref 38–126)
Anion gap: 11 (ref 5–15)
BUN: 48 mg/dL — ABNORMAL HIGH (ref 6–20)
CO2: 14 mmol/L — ABNORMAL LOW (ref 22–32)
Calcium: 8.6 mg/dL — ABNORMAL LOW (ref 8.9–10.3)
Chloride: 110 mmol/L (ref 98–111)
Creatinine, Ser: 10.35 mg/dL — ABNORMAL HIGH (ref 0.44–1.00)
GFR, Estimated: 5 mL/min — ABNORMAL LOW (ref >60)
Glucose, Bld: 77 mg/dL (ref 70–99)
Potassium: 4.6 mmol/L (ref 3.5–5.1)
Sodium: 135 mmol/L (ref 135–145)
Total Bilirubin: 0.6 mg/dL (ref 0.3–1.2)
Total Protein: 7.5 g/dL (ref 6.5–8.1)

## 2022-05-01 LAB — LIPASE, BLOOD: Lipase: 32 U/L (ref 11–51)

## 2022-05-01 LAB — HCG, QUANTITATIVE, PREGNANCY: hCG, Beta Chain, Quant, S: 8 m[IU]/mL — ABNORMAL HIGH (ref <5)

## 2022-05-01 MED ORDER — PANTOPRAZOLE SODIUM 40 MG PO TBEC
40.0000 mg | DELAYED_RELEASE_TABLET | Freq: Once | ORAL | Status: AC
  Administered 2022-05-01: 40 mg via ORAL
  Filled 2022-05-01: qty 1

## 2022-05-01 MED ORDER — DICYCLOMINE HCL 10 MG/ML IM SOLN
20.0000 mg | Freq: Once | INTRAMUSCULAR | Status: AC
  Administered 2022-05-01: 20 mg via INTRAMUSCULAR
  Filled 2022-05-01: qty 2

## 2022-05-01 MED ORDER — DIPHENHYDRAMINE HCL 50 MG/ML IJ SOLN
25.0000 mg | Freq: Once | INTRAMUSCULAR | Status: AC
  Administered 2022-05-01: 25 mg via INTRAVENOUS
  Filled 2022-05-01: qty 1

## 2022-05-01 MED ORDER — HYDROMORPHONE HCL 1 MG/ML IJ SOLN
1.0000 mg | Freq: Once | INTRAMUSCULAR | Status: AC
  Administered 2022-05-01: 1 mg via INTRAVENOUS
  Filled 2022-05-01: qty 1

## 2022-05-01 MED ORDER — ONDANSETRON 4 MG PO TBDP: 4.0000 mg | ORAL_TABLET | Freq: Three times a day (TID) | ORAL | 0 refills | Status: DC | PRN

## 2022-05-01 MED ORDER — PROCHLORPERAZINE EDISYLATE 10 MG/2ML IJ SOLN
10.0000 mg | Freq: Once | INTRAMUSCULAR | Status: AC
  Administered 2022-05-01: 10 mg via INTRAVENOUS
  Filled 2022-05-01: qty 2

## 2022-05-01 MED ORDER — METOPROLOL TARTRATE 5 MG/5ML IV SOLN
5.0000 mg | Freq: Once | INTRAVENOUS | Status: AC
  Administered 2022-05-01: 5 mg via INTRAVENOUS
  Filled 2022-05-01: qty 5

## 2022-05-01 MED ORDER — PANTOPRAZOLE SODIUM 20 MG PO TBEC: 40.0000 mg | DELAYED_RELEASE_TABLET | Freq: Every day | ORAL | 0 refills | Status: AC

## 2022-05-01 MED ORDER — ALUM & MAG HYDROXIDE-SIMETH 200-200-20 MG/5ML PO SUSP
30.0000 mL | Freq: Once | ORAL | Status: AC
  Administered 2022-05-01: 30 mL via ORAL
  Filled 2022-05-01: qty 30

## 2022-05-01 MED ORDER — ONDANSETRON HCL 4 MG/2ML IJ SOLN
4.0000 mg | Freq: Once | INTRAMUSCULAR | Status: AC
  Administered 2022-05-01: 4 mg via INTRAVENOUS
  Filled 2022-05-01: qty 2

## 2022-05-01 MED ORDER — OXYCODONE HCL 5 MG PO TABS
5.0000 mg | ORAL_TABLET | Freq: Once | ORAL | Status: AC
  Administered 2022-05-01: 5 mg via ORAL
  Filled 2022-05-01: qty 1

## 2022-05-01 MED ORDER — IOHEXOL 300 MG/ML  SOLN
75.0000 mL | Freq: Once | INTRAMUSCULAR | Status: AC | PRN
  Administered 2022-05-01: 75 mL via INTRAVENOUS

## 2022-05-01 MED ORDER — PREDNISONE 10 MG PO TABS: 40.0000 mg | ORAL_TABLET | Freq: Every day | ORAL | 0 refills | Status: DC

## 2022-05-01 MED ORDER — DICYCLOMINE HCL 20 MG PO TABS: 20.0000 mg | ORAL_TABLET | Freq: Two times a day (BID) | ORAL | 0 refills | Status: DC

## 2022-05-01 NOTE — ED Notes (Signed)
Pt transported to CT ?

## 2022-05-01 NOTE — ED Notes (Signed)
ED Provider at bedside. 

## 2022-05-01 NOTE — ED Notes (Signed)
Pt sts she took Zofran ODT about 2hrs PTA and has not vomited since

## 2022-05-01 NOTE — ED Triage Notes (Signed)
Pt c/o abdominal pain for past 12 hours with nausea. Pt tried zofran without relief.

## 2022-05-01 NOTE — ED Notes (Signed)
Pt unable to provide urine sample.

## 2022-05-01 NOTE — ED Notes (Signed)
Unable to provide u/a at this time

## 2022-05-01 NOTE — ED Notes (Signed)
Water provided for po challenge

## 2022-05-01 NOTE — Discharge Instructions (Addendum)
You have abdominal pain, nausea, diarrhea.  We do not see signs of abdominal problem that would require surgery or admission.  It is possible this is an infection causing these symptoms, gastritis or ulcer disease and will give you pantoprazole for treatment and zofran.  With your lupus history and fluid (possibly from VP shunt) it is also difficult to rule out lupus peritonitis although this is not common.  Recommend follow up with your PCP and rheumatologist.  Go to your scheduled dialysis tomorrow morning.

## 2022-05-01 NOTE — ED Provider Notes (Signed)
[facility] Provider Note   CSN: [id number] Arrival date & time: 05/01/22  1435     History {Add pertinent medical, surgical, social history, OB history to HPI:1} Chief Complaint  Patient presents with   Abdominal Pain    Anne Robinson is a 32 y.o. female.  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   Reports waking up this AM with nausea, feeling of stomach upset, distention, bloating, epigastric discomfort.  Feels somehwat like pancreatitis she has had in the past but notes that was prior to having gb removed.  Nausea persisting despite taking Zofran, however has not vomited.  Tried drinking peppermint tea without any relief.  Reports just a feeling of being uncomfortable.  Had a few episodes of diarrhea this morning.  Makes very little urine, but denies any dysuria.  No fevers, chills, chest pain, shortness of breath, cough. Has mirena and menses not regular.   Has had headaches for a few days-was started on imdur after cardiology visit 2/9.  Stopped the medication as thinks that was contributing.    Prior PD catheter, prior cholecystectomy.  Past Medical History:  Diagnosis Date   HTN (hypertension) 05/15/2016   Lupus ([place])    Lupus nephritis ([place])    Pseudotumor cerebri    Renal disorder    Thyroid disease      Home Medications Prior to Admission medications   Medication Sig Start Date End Date Taking? Authorizing Provider  amLODipine (NORVASC) 5 MG tablet Take 5 mg by mouth daily. 09/12/21   [provider]  aspirin 81 MG chewable tablet Chew 81 mg by mouth daily. 07/11/21   [provider]  atorvastatin (LIPITOR) 40 MG tablet Take 40 mg by mouth  daily. 07/11/21   [provider]  butalbital-acetaminophen-caffeine (FIORICET) 50-325-40 MG tablet Take 1 tablet by mouth every 6 (six) hours as needed for headache. 09/15/21   Norval Morton, MD  calcium acetate (PHOSLO) 667 MG capsule Take 667 mg by mouth 3 (three) times daily. 07/22/21   [provider]  cetirizine (ZYRTEC) 10 MG tablet Take 10 mg by mouth daily. 08/14/21   [provider]  ELIQUIS 5 MG TABS tablet Take 5 mg by mouth 2 (two) times daily. 07/11/21   [provider]  fluticasone (FLONASE) 50 MCG/ACT nasal spray Place 1 spray into both nostrils daily as needed for allergies.    [provider]  hydrALAZINE (APRESOLINE) 25 MG tablet Take 25 mg by mouth 3 (three) times daily. 09/13/21   [provider]  HYDROcodone-acetaminophen (NORCO/VICODIN) 5-325 MG tablet Take 1-2 tablets by mouth every 6 (six) hours as needed for severe pain. 04/21/22   Truddie Hidden, MD  ibuprofen (ADVIL) 200 MG tablet Take 800 mg by mouth every 6 (six) hours as needed for headache.    [provider]  levETIRAcetam (KEPPRA) 500 MG tablet Take 500 mg by mouth daily. 08/12/21   [provider]  levothyroxine (SYNTHROID) 50 MCG tablet Take 50 mcg by mouth daily. 09/04/21   [provider]  metoprolol succinate (TOPROL-XL) 50 MG 24 hr tablet Take 50 mg by mouth 2 (two) times daily. 07/11/21   [provider]  pantoprazole (PROTONIX) 40 MG tablet Take 40  mg by mouth daily. 08/29/19 11/13/21  [provider]  predniSONE (DELTASONE) 5 MG tablet Take 5 mg by mouth daily with breakfast.    [provider]  tiZANidine (ZANAFLEX) 4 MG tablet Take 4 mg by mouth at bedtime. 09/13/21   [provider]  traZODone (DESYREL) 50 MG tablet Take 25 mg by mouth at bedtime as needed for sleep. 08/14/21   [provider]  temazepam (RESTORIL) 15 MG capsule Take by mouth. 01/21/19 12/07/19  [provider]       Allergies    Reglan [metoclopramide] and Zestril [lisinopril]    Review of Systems   Review of Systems  Physical Exam Updated Vital Signs BP (!) 183/127 (BP Location: Right Arm)   Pulse (!) 113   Temp 98.4 F (36.9 C) (Oral)   Ht '4\' 11"'$  (1.499 m)   Wt 79 kg   SpO2 99%   BMI 35.18 kg/m  Physical Exam Vitals and nursing note reviewed.  Constitutional:      General: She is not in acute distress.    Appearance: She is well-developed. She is not diaphoretic.  HENT:     Head: Normocephalic and atraumatic.  Eyes:     Conjunctiva/sclera: Conjunctivae normal.  Cardiovascular:     Rate and Rhythm: Normal rate and regular rhythm.  Pulmonary:     Effort: Pulmonary effort is normal. No respiratory distress.     Breath sounds: Normal breath sounds.  Abdominal:     General: There is no distension.     Palpations: Abdomen is soft.     Tenderness: There is abdominal tenderness in the epigastric area. There is no guarding. Negative signs include McBurney's sign.  Musculoskeletal:        General: No tenderness.     Cervical back: Normal range of motion.  Skin:    General: Skin is warm and dry.     Findings: No erythema or rash.  Neurological:     Mental Status: She is alert and oriented to person, place, and time.     ED Results / Procedures / Treatments   Labs (all labs ordered are listed, but only abnormal results are displayed) Labs Reviewed  COMPREHENSIVE METABOLIC PANEL  LIPASE, BLOOD  CBC WITH DIFFERENTIAL/PLATELET  URINALYSIS, ROUTINE W REFLEX MICROSCOPIC  PREGNANCY, URINE  HCG, QUANTITATIVE, PREGNANCY    EKG None  Radiology No results found.  Procedures Ultrasound ED Peripheral IV (Provider)  Date/Time: 05/01/2022 3:50 PM  Performed by: Gareth Morgan, MD Authorized by: Gareth Morgan, MD   Procedure details:    Indications: hydration     Skin Prep: chlorhexidine gluconate     Location:  Right AC   Angiocath:  20 G   Bedside Ultrasound Guided:  Yes     Images: not archived     Patient tolerated procedure without complications: Yes     Dressing applied: Yes     {Document cardiac monitor, telemetry assessment procedure when appropriate:1}  Medications Ordered in ED Medications  HYDROmorphone (DILAUDID) injection 1 mg (has no administration in time range)  ondansetron (ZOFRAN) injection 4 mg (has no administration in time range)    ED Course/ Medical Decision Making/ A&P   {   Click here for ABCD2, HEART and other calculatorsREFRESH Note before signing :1}                   32yo female with history of hypertension, lupus, 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    DDx includes appendicitis, pancreatitis, choledocholithiasis, pyelonephritis, nephrolithiasis, diverticulitis, SBO, peritonitis, gastritis, gastroenteritis, PID, ovarian torsion, ectopic pregnancy, and tuboovarian abscess/ Given tenderness on exam, no dyspnea, no chest pain, suspect intraabdominal source of pain and doubt ACS, PE, aortic dissection.  Labs obtained and personally about interpreted by me show elevation in BUN and creatinine consistent with dialysis patient/due for dialysis tomorrow, bicarb decreased to 14, nonanion gap, hgb 10.3, no leukocytosis, normal lipase and transaminases.   Hcg 8. Likely lab abnormality, states she is celibate and also has mirena-discussed can recheck preg test as outpt in one week but suspect this is lab abnormality and not true pregnancy or other pathologic hcg increase.  Does have   {Document critical care time when appropriate:1} {Document review of labs and clinical decision tools ie heart score, Chads2Vasc2 etc:1}  {Document your independent review of radiology images, and any outside records:1} {Document your discussion  with family members, caretakers, and with consultants:1} {Document social determinants of health affecting pt's care:1} {Document your decision making why or why not admission, treatments were needed:1} Final Clinical Impression(s) / ED Diagnoses Final diagnoses:  Epigastric pain    Rx / DC Orders ED Discharge Orders     None

## 2022-05-02 NOTE — ED Provider Notes (Incomplete)
[facility]   CSN: [id number] Arrival date & time: 05/01/22  1435     History {Add pertinent medical, surgical, social history, OB history to HPI:1} Chief Complaint  Patient presents with  . Abdominal Pain    Anne Robinson is a 32 y.o. female.  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   Reports waking up this AM with nausea, feeling of stomach upset, distention, bloating, epigastric discomfort.  Feels somehwat like pancreatitis she has had in the past but notes that was prior to having gb removed.  Nausea persisting despite taking Zofran, however has not vomited.  Tried drinking peppermint tea without any relief.  Reports just a feeling of being uncomfortable.  Had a few episodes of diarrhea this morning.  Makes very little urine, but denies any dysuria.  No fevers, chills, chest pain, shortness of breath, cough. Has mirena and menses not regular.   Has had headaches for a few days-was started on imdur after cardiology visit 2/9.  Stopped the medication as thinks that was contributing.    Prior PD catheter, prior cholecystectomy.  Past Medical History:  Diagnosis Date  . HTN (hypertension) 05/15/2016  . Lupus ([place])   . Lupus nephritis ([place])   . Pseudotumor cerebri   . Renal disorder   . Thyroid disease      Home Medications Prior to Admission medications   Medication Sig Start Date End Date Taking? Authorizing Provider  amLODipine (NORVASC) 5 MG tablet Take 5 mg by mouth daily. 09/12/21   [provider]  aspirin 81 MG chewable tablet Chew 81 mg by mouth daily. 07/11/21   [provider]  atorvastatin (LIPITOR) 40 MG tablet Take 40 mg  by mouth daily. 07/11/21   [provider]  butalbital-acetaminophen-caffeine (FIORICET) 50-325-40 MG tablet Take 1 tablet by mouth every 6 (six) hours as needed for headache. 09/15/21   Norval Morton, MD  calcium acetate (PHOSLO) 667 MG capsule Take 667 mg by mouth 3 (three) times daily. 07/22/21   [provider]  cetirizine (ZYRTEC) 10 MG tablet Take 10 mg by mouth daily. 08/14/21   [provider]  ELIQUIS 5 MG TABS tablet Take 5 mg by mouth 2 (two) times daily. 07/11/21   [provider]  fluticasone (FLONASE) 50 MCG/ACT nasal spray Place 1 spray into both nostrils daily as needed for allergies.    [provider]  hydrALAZINE (APRESOLINE) 25 MG tablet Take 25 mg by mouth 3 (three) times daily. 09/13/21   [provider]  HYDROcodone-acetaminophen (NORCO/VICODIN) 5-325 MG tablet Take 1-2 tablets by mouth every 6 (six) hours as needed for severe pain. 04/21/22   Truddie Hidden, MD  ibuprofen (ADVIL) 200 MG tablet Take 800 mg by mouth every 6 (six) hours as needed for headache.    [provider]  levETIRAcetam (KEPPRA) 500 MG tablet Take 500 mg by mouth daily. 08/12/21   [provider]  levothyroxine (SYNTHROID) 50 MCG tablet Take 50 mcg by mouth daily. 09/04/21   [provider]  metoprolol succinate (TOPROL-XL) 50 MG 24 hr tablet Take 50 mg by mouth 2 (two) times daily. 07/11/21   [provider]  pantoprazole (PROTONIX) 40 MG tablet Take 40  mg by mouth daily. 08/29/19 11/13/21  [provider]  predniSONE (DELTASONE) 5 MG tablet Take 5 mg by mouth daily with breakfast.    [provider]  tiZANidine (ZANAFLEX) 4 MG tablet Take 4 mg by mouth at bedtime. 09/13/21   [provider]  traZODone (DESYREL) 50 MG tablet Take 25 mg by mouth at bedtime as needed for sleep. 08/14/21   [provider]  temazepam (RESTORIL) 15 MG capsule Take by mouth. 01/21/19 12/07/19  [provider]       Allergies    Reglan [metoclopramide] and Zestril [lisinopril]    Review of Systems   Review of Systems  Physical Exam Updated Vital Signs BP (!) 183/127 (BP Location: Right Arm)   Pulse (!) 113   Temp 98.4 F (36.9 C) (Oral)   Ht '4\' 11"'$  (1.499 m)   Wt 79 kg   SpO2 99%   BMI 35.18 kg/m  Physical Exam Vitals and nursing note reviewed.  Constitutional:      General: She is not in acute distress.    Appearance: She is well-developed. She is not diaphoretic.  HENT:     Head: Normocephalic and atraumatic.  Eyes:     Conjunctiva/sclera: Conjunctivae normal.  Cardiovascular:     Rate and Rhythm: Normal rate and regular rhythm.  Pulmonary:     Effort: Pulmonary effort is normal. No respiratory distress.     Breath sounds: Normal breath sounds.  Abdominal:     General: There is no distension.     Palpations: Abdomen is soft.     Tenderness: There is abdominal tenderness in the epigastric area. There is no guarding. Negative signs include McBurney's sign.  Musculoskeletal:        General: No tenderness.     Cervical back: Normal range of motion.  Skin:    General: Skin is warm and dry.     Findings: No erythema or rash.  Neurological:     Mental Status: She is alert and oriented to person, place, and time.     ED Results / Procedures / Treatments   Labs (all labs ordered are listed, but only abnormal results are displayed) Labs Reviewed  COMPREHENSIVE METABOLIC PANEL  LIPASE, BLOOD  CBC WITH DIFFERENTIAL/PLATELET  URINALYSIS, ROUTINE W REFLEX MICROSCOPIC  PREGNANCY, URINE  HCG, QUANTITATIVE, PREGNANCY    EKG None  Radiology No results found.  Procedures Ultrasound ED Peripheral IV (Provider)  Date/Time: 05/01/2022 3:50 PM  Performed by: Gareth Morgan, MD Authorized by: Gareth Morgan, MD   Procedure details:    Indications: hydration     Skin Prep: chlorhexidine gluconate     Location:  Right AC   Angiocath:  20 G   Bedside Ultrasound  Guided: Yes     Images: not archived     Patient tolerated procedure without complications: Yes     Dressing applied: Yes     {Document cardiac monitor, telemetry assessment procedure when appropriate:1}  Medications Ordered in ED Medications  HYDROmorphone (DILAUDID) injection 1 mg (has no administration in time range)  ondansetron (ZOFRAN) injection 4 mg (has no administration in time range)    ED Course/ Medical Decision Making/ A&P   {   Click here for ABCD2, HEART and other calculatorsREFRESH Note before signing :1}                   32yo female with history of hypertension, lupus, 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    DDx includes appendicitis, pancreatitis, choledocholithiasis, pyelonephritis, nephrolithiasis, diverticulitis, SBO, peritonitis, gastritis, gastroenteritis, PID, ovarian torsion, ectopic pregnancy, and tuboovarian abscess/ Given tenderness on exam, no dyspnea, no chest pain, suspect intraabdominal source of pain and doubt ACS, PE, aortic dissection.    Labs obtained and personally about interpreted by me show elevation in BUN and creatinine consistent with dialysis patient/due for dialysis tomorrow, bicarb decreased to 14, nonanion gap (likely due to renal insufficiency and diarrhea), hgb 10.3, no leukocytosis, normal lipase and transaminases.   Hcg 8. Likely lab abnormality, states she is celibate and also has mirena-discussed can recheck preg test as outpt in one week but suspect this is lab abnormality and not true pregnancy or other pathologic hcg increase.   CT obtained and personally read interpreted by me and radiology shows no evidence of acute abnormality, small amount of free fluid within the abdomen and pelvis likely related to VP shunt.  Have low  suspicion this represents a bacterial peritonitis given no leukocytosis, no fever, and given small amount of fluid present is not appropriate for paracentesis.  Discussed the possibility of lupus peritonitis as etiology of her pain, although in the setting of nausea, vomiting and diarrhea suspect her pain and symptoms are likely secondary to gastritis and gastroenteritis, and have concern that increased steroid dosing would exacerbate gastritis.  She is not having lower abdominal pain, have low suspicion clinically for PID, torsion.  CT does not show signs of inflammation of the urinary tract, she is not having urinary symptoms, does not make much urine, and do not suspect UTI or pyelonephritis.  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      {Document critical care time when appropriate:1} {Document review of labs and clinical decision tools ie heart score, Chads2Vasc2 etc:1}  {Document your independent review of radiology images, and any outside records:1} {Document your discussion with family members, caretakers, and with consultants:1} {Document social determinants of health affecting pt's care:1} {Document your decision making why or why not admission, treatments were needed:1} Final Clinical Impression(s) / ED Diagnoses Final diagnoses:  Epigastric pain    Rx / DC Orders ED Discharge Orders     None

## 2022-05-22 ENCOUNTER — Emergency Department (HOSPITAL_BASED_OUTPATIENT_CLINIC_OR_DEPARTMENT_OTHER)
Admission: EM | Admit: 2022-05-22 | Discharge: 2022-05-23 | Disposition: A | Discharge status: Home / Self Care | Payer: Medicare HMO | Attending: Emergency Medicine | Admitting: Emergency Medicine

## 2022-05-22 ENCOUNTER — Encounter (HOSPITAL_BASED_OUTPATIENT_CLINIC_OR_DEPARTMENT_OTHER): Payer: Self-pay | Admitting: Emergency Medicine

## 2022-05-22 ENCOUNTER — Other Ambulatory Visit: Payer: Self-pay

## 2022-05-22 DIAGNOSIS — N186 End stage renal disease: Secondary | ICD-10-CM | POA: Insufficient documentation

## 2022-05-22 DIAGNOSIS — R1013 Epigastric pain: Secondary | ICD-10-CM | POA: Insufficient documentation

## 2022-05-22 DIAGNOSIS — Z79899 Other long term (current) drug therapy: Secondary | ICD-10-CM | POA: Insufficient documentation

## 2022-05-22 DIAGNOSIS — Z992 Dependence on renal dialysis: Secondary | ICD-10-CM | POA: Diagnosis not present

## 2022-05-22 DIAGNOSIS — Z7901 Long term (current) use of anticoagulants: Secondary | ICD-10-CM | POA: Insufficient documentation

## 2022-05-22 DIAGNOSIS — D72829 Elevated white blood cell count, unspecified: Secondary | ICD-10-CM | POA: Insufficient documentation

## 2022-05-22 DIAGNOSIS — Z7982 Long term (current) use of aspirin: Secondary | ICD-10-CM | POA: Diagnosis not present

## 2022-05-22 DIAGNOSIS — I120 Hypertensive chronic kidney disease with stage 5 chronic kidney disease or end stage renal disease: Secondary | ICD-10-CM | POA: Insufficient documentation

## 2022-05-22 DIAGNOSIS — R112 Nausea with vomiting, unspecified: Secondary | ICD-10-CM | POA: Diagnosis not present

## 2022-05-22 LAB — CBC
HCT: 42.9 % (ref 36.0–46.0)
Hemoglobin: 12.5 g/dL (ref 12.0–15.0)
MCH: 31.3 pg (ref 26.0–34.0)
MCHC: 29.1 g/dL — ABNORMAL LOW (ref 30.0–36.0)
MCV: 107.3 fL — ABNORMAL HIGH (ref 80.0–100.0)
Platelets: 284 10*3/uL (ref 150–400)
RBC: 4 MIL/uL (ref 3.87–5.11)
RDW: 24.4 % — ABNORMAL HIGH (ref 11.5–15.5)
WBC: 16.7 10*3/uL — ABNORMAL HIGH (ref 4.0–10.5)
nRBC: 15 % — ABNORMAL HIGH (ref 0.0–0.2)

## 2022-05-22 LAB — COMPREHENSIVE METABOLIC PANEL
ALT: 11 U/L (ref 0–44)
AST: 27 U/L (ref 15–41)
Albumin: 2.9 g/dL — ABNORMAL LOW (ref 3.5–5.0)
Alkaline Phosphatase: 72 U/L (ref 38–126)
Anion gap: 10 (ref 5–15)
BUN: 56 mg/dL — ABNORMAL HIGH (ref 6–20)
CO2: 15 mmol/L — ABNORMAL LOW (ref 22–32)
Calcium: 8.4 mg/dL — ABNORMAL LOW (ref 8.9–10.3)
Chloride: 111 mmol/L (ref 98–111)
Creatinine, Ser: 8.91 mg/dL — ABNORMAL HIGH (ref 0.44–1.00)
GFR, Estimated: 6 mL/min — ABNORMAL LOW (ref >60)
Glucose, Bld: 132 mg/dL — ABNORMAL HIGH (ref 70–99)
Potassium: 4.2 mmol/L (ref 3.5–5.1)
Sodium: 136 mmol/L (ref 135–145)
Total Bilirubin: 0.6 mg/dL (ref 0.3–1.2)
Total Protein: 6.4 g/dL — ABNORMAL LOW (ref 6.5–8.1)

## 2022-05-22 LAB — LIPASE, BLOOD: Lipase: 26 U/L (ref 11–51)

## 2022-05-22 MED ORDER — SODIUM CHLORIDE 0.9 % IV BOLUS
1000.0000 mL | Freq: Once | INTRAVENOUS | Status: AC
  Administered 2022-05-22: 1000 mL via INTRAVENOUS

## 2022-05-22 MED ORDER — HYDROMORPHONE HCL 1 MG/ML IJ SOLN
1.0000 mg | Freq: Once | INTRAMUSCULAR | Status: AC
  Administered 2022-05-22: 1 mg via INTRAVENOUS
  Filled 2022-05-22: qty 1

## 2022-05-22 MED ORDER — PROCHLORPERAZINE EDISYLATE 10 MG/2ML IJ SOLN
10.0000 mg | Freq: Once | INTRAMUSCULAR | Status: AC
  Administered 2022-05-22: 10 mg via INTRAVENOUS
  Filled 2022-05-22: qty 2

## 2022-05-22 NOTE — ED Provider Notes (Signed)
[place]   CSN: [id number] Arrival date & time: 05/22/22  1749     History {Add pertinent medical, surgical, social history, OB history to HPI:1} Chief Complaint  Patient presents with   Abdominal Pain    Anne Robinson is a 32 y.o. female.   Abdominal Pain      Home Medications Prior to Admission medications   Medication Sig Start Date End Date Taking? Authorizing Provider  amLODipine (NORVASC) 5 MG tablet Take 5 mg by mouth daily. 09/12/21   [provider]  aspirin 81 MG chewable tablet Chew 81 mg by mouth daily. 07/11/21   [provider]  atorvastatin (LIPITOR) 40 MG tablet Take 40 mg by mouth daily. 07/11/21   [provider]  butalbital-acetaminophen-caffeine (FIORICET) 50-325-40 MG tablet Take 1 tablet by mouth every 6 (six) hours as needed for headache. 09/15/21   Norval Morton, MD  calcium acetate (PHOSLO) 667 MG capsule Take 667 mg by mouth 3 (three) times daily. 07/22/21   [provider]  cetirizine (ZYRTEC) 10 MG tablet Take 10 mg by mouth daily. 08/14/21   [provider]  dicyclomine (BENTYL) 20 MG tablet Take 1 tablet (20 mg total) by mouth 2 (two) times daily. 05/01/22   Gareth Morgan, MD  ELIQUIS 5 MG TABS tablet Take 5 mg by mouth 2 (two) times daily. 07/11/21   [provider]  fluticasone (FLONASE) 50 MCG/ACT nasal spray Place 1 spray into both nostrils daily as needed for allergies.    [provider]  hydrALAZINE (APRESOLINE) 25 MG tablet Take 25 mg by mouth 3 (three) times daily. 09/13/21   [provider]  HYDROcodone-acetaminophen (NORCO/VICODIN) 5-325 MG tablet Take 1-2 tablets by mouth every 6 (six) hours as needed for severe pain. 04/21/22   Truddie Hidden, MD  ibuprofen (ADVIL) 200 MG tablet Take 800 mg by mouth every 6 (six) hours as needed for headache.    [provider]  levETIRAcetam (KEPPRA) 500 MG  tablet Take 500 mg by mouth daily. 08/12/21   [provider]  levothyroxine (SYNTHROID) 50 MCG tablet Take 50 mcg by mouth daily. 09/04/21   [provider]  metoprolol succinate (TOPROL-XL) 50 MG 24 hr tablet Take 50 mg by mouth 2 (two) times daily. 07/11/21   [provider]  ondansetron (ZOFRAN-ODT) 4 MG disintegrating tablet Take 1 tablet (4 mg total) by mouth every 8 (eight) hours as needed for nausea or vomiting. 05/01/22   Gareth Morgan, MD  pantoprazole (PROTONIX) 20 MG tablet Take 2 tablets (40 mg total) by mouth daily for 14 days. 05/01/22 05/15/22  Gareth Morgan, MD  predniSONE (DELTASONE) 5 MG tablet Take 5 mg by mouth daily with breakfast.    [provider]  tiZANidine (ZANAFLEX) 4 MG tablet Take 4 mg by mouth at bedtime. 09/13/21   [provider]  traZODone (DESYREL) 50 MG tablet Take 25 mg by mouth at bedtime as needed for sleep. 08/14/21   [provider]  temazepam (RESTORIL) 15 MG capsule Take by mouth. 01/21/19 12/07/19  [provider]      Allergies    Reglan [metoclopramide] and Zestril [lisinopril]    Review of Systems   Review of Systems  Gastrointestinal:  Positive for abdominal pain.    Physical Exam Updated Vital Signs BP (!) 150/123   Pulse (!) 113   Temp (!) 96.9 F (36.1 C)   Resp 16  Ht 4\' 11"  (1.499 m)   Wt 78.9 kg   SpO2 100%   BMI 35.14 kg/m  Physical Exam  ED Results / Procedures / Treatments   Labs (all labs ordered are listed, but only abnormal results are displayed) Labs Reviewed  LIPASE, BLOOD  COMPREHENSIVE METABOLIC PANEL  CBC  URINALYSIS, ROUTINE W REFLEX MICROSCOPIC  PREGNANCY, URINE    EKG EKG Interpretation  Date/Time:  Sunday May 22 2022 18:34:18 EDT Ventricular Rate:  112 PR Interval:  126 QRS Duration: 73 QT Interval:  321 QTC Calculation: 439 R Axis:   9 Text Interpretation: Sinus tachycardia Borderline T abnormalities, anterior leads when compared to  prior, overall similar appearance with faster rate. No STEMI Confirmed by Antony Blackbird [phone]) on 05/22/2022 7:45:32 PM  Radiology No results found.  Procedures Procedures  {Document cardiac monitor, telemetry assessment procedure when appropriate:1}  Medications Ordered in ED Medications - No data to display  ED Course/ Medical Decision Making/ A&P   {   Click here for ABCD2, HEART and other calculatorsREFRESH Note before signing :1}                          Medical Decision Making Amount and/or Complexity of Data Reviewed Labs: ordered.    Anne Robinson is a 32 y.o. female with a past medical history significant for ESRD on dialysis MWF, hypertension, previous DVT on Eliquis therapy, hypertension, lupus, and recurrent abdominal pain who presents with abdominal pain, nausea, vomiting, diarrhea.  According to patient, for the last month or so, she is been having almost weekly episodes of severe nausea, vomiting, diarrhea, and pain.  She was told it could be gastroparesis and she is already had her gallbladder move.  She reports she avoids greasy foods and seems to be on weekends when this happens.  She denies any alcohol use or drug use or any other medication changes.  Denies any trauma.  Reports the pain is 10 out of 10 in severity but feels similar to what it was a month ago when she had a CT scan that was reassuring.  She denies any fevers, chills, congestion, cough.  Reports no urinary changes and makes very minimal urine.  Denies dysuria.  Denies other complaints.  On exam, patient is appearing uncomfortable and is dry heaving.  Abdomen is diffusely tender but bowel sounds are appreciated.  Lungs are clear.  Chest nontender.  No murmur on my exam.  Good pulses in extremities.  Patient has dry mucous membranes.  Clinically suspect she is slightly dehydrated with nausea vomiting, diarrhea, decreased oral intake today.  Will give some fluids some pain medicine and some nausea  medicine.  Will get some screening labs and urinalysis.  Patient says that the pain is similar to what she had when she had a CT scan last month so we agreed together to hold on CT imaging again.  As she does still make some urine, if we were to get imaging would likely do it without contrast.  Will hold on imaging at this time.  Anticipate reassessment after medications however this could be a viral gastroenteritis as this has been going a large amount over the last few days.  Anticipate reassessment after workup.     {Document critical care time when appropriate:1} {Document review of labs and clinical decision tools ie heart score, Chads2Vasc2 etc:1}  {Document your independent review of radiology images, and any outside records:1} {Document your discussion with family  members, caretakers, and with consultants:1} {Document social determinants of health affecting pt's care:1} {Document your decision making why or why not admission, treatments were needed:1} Final Clinical Impression(s) / ED Diagnoses Final diagnoses:  None    Rx / DC Orders ED Discharge Orders     None

## 2022-05-22 NOTE — ED Triage Notes (Addendum)
Pt arrives pov, to triage in wheelchair, with c/o ABd pain with n/v/d today. Took zofran with no improvement. Endorses feeling light-headed and weak. Pt states "I need to lay down". PT informed that no beds are avail at this time, will continue to monitor.

## 2022-05-23 MED ORDER — PROCHLORPERAZINE MALEATE 10 MG PO TABS: 10.0000 mg | ORAL_TABLET | Freq: Two times a day (BID) | ORAL | 0 refills | Status: AC | PRN

## 2022-05-23 MED ORDER — PROCHLORPERAZINE EDISYLATE 10 MG/2ML IJ SOLN
10.0000 mg | Freq: Once | INTRAMUSCULAR | Status: AC
  Administered 2022-05-23: 10 mg via INTRAVENOUS
  Filled 2022-05-23: qty 2

## 2022-05-23 NOTE — Discharge Instructions (Signed)
Your history, exam, and workup today revealed some dehydration and some mild electrolyte abnormalities that I suspect are due to the nausea and vomiting.  Given the amount of viral illness in the community you could have a viral bug causing all of the discomfort and nausea and vomiting on your already sensitive GI tract.  As your symptoms are similar to prior with the reassuring CT, we agreed to hold on repeat CT scan today but I do want you to follow-up with outpatient GI team.  Please rest and stay hydrated and use the new nausea medicine and the Compazine to help with your symptoms.  Please call your PCP and see your GI doctor.  If any symptoms change or worsen acutely, please return to the nearest emergency department.

## 2022-08-03 ENCOUNTER — Other Ambulatory Visit: Payer: Self-pay

## 2022-08-03 ENCOUNTER — Ambulatory Visit: Payer: Medicare HMO | Attending: Acute Care

## 2022-08-03 DIAGNOSIS — Z96611 Presence of right artificial shoulder joint: Secondary | ICD-10-CM | POA: Diagnosis present

## 2022-08-03 DIAGNOSIS — M6281 Muscle weakness (generalized): Secondary | ICD-10-CM | POA: Insufficient documentation

## 2022-08-03 DIAGNOSIS — M25611 Stiffness of right shoulder, not elsewhere classified: Secondary | ICD-10-CM | POA: Insufficient documentation

## 2022-08-03 NOTE — Therapy (Signed)
OUTPATIENT PHYSICAL THERAPY SHOULDER EVALUATION   Patient Name: Anne Robinson MRN: [id number] DOB:[birth year], 32 y.o., female Today's Date: 08/03/2022  END OF SESSION:  PT End of Session - 08/03/22 1755     Date for PT Re-Evaluation 10/26/22    Authorization Type MCR and UHC    Progress Note Due on Visit 10    PT Start Time 1520    PT Stop Time 1600    PT Time Calculation (min) 40 min    Activity Tolerance Patient tolerated treatment well    Behavior During Therapy [facility] for tasks assessed/performed             Past Medical History:  Diagnosis Date   HTN (hypertension) 05/15/2016   Lupus (HCC)    Lupus nephritis (HCC)    Pseudotumor cerebri    Renal disorder    Thyroid disease    Past Surgical History:  Procedure Laterality Date   INSERTION OF DIALYSIS CATHETER     RENAL BIOPSY     SHOULDER SURGERY     VENTRICULAR ATRIAL SHUNT Right 11/27/2018   Codman programmable shunt set at 150   Patient Active Problem List   Diagnosis Date Noted   ESRD on dialysis (HCC) 09/15/2021   Idiopathic intracranial hypertension 09/15/2021   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   Anemia of chronic renal failure 09/15/2021   History of DVT (deep vein thrombosis) 09/15/2021   Thrombocytopenia (HCC) 09/15/2021   Hypothyroidism 09/15/2021   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   Complicated migraine 09/14/2021   Cellulitis of chest wall    Acute renal failure (HCC) 01/26/2017   Exacerbation of systemic lupus erythematosus (HCC) 01/26/2017   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   Tachypnea 01/24/2017   Dyspnea 01/24/2017   Macular rash 01/24/2017   Abscess of left axilla 01/24/2017   Abscess of axilla, left 01/23/2017   Hypotension    Sepsis (HCC) 05/15/2016   Elevated troponin 05/15/2016   HTN (hypertension) 05/15/2016   SLE (systemic lupus erythematosus) (HCC) 05/15/2016    PCP: Ninetta Lights, MD  REFERRING PROVIDER: Sula Soda, NP  REFERRING DIAG: s/ p R reverse  TSA  THERAPY DIAG:  Stiffness of right shoulder, not elsewhere classified  Status post reverse total replacement of right shoulder  Muscle weakness (generalized)  Rationale for Evaluation and Treatment: Rehabilitation  ONSET DATE: 06/22/22  SUBJECTIVE:                                                                                                                                                                                      SUBJECTIVE STATEMENT: Feel much better immediately after surgery Hand dominance: Right  PERTINENT  HISTORY: Suffered B shoulder fractures, had B ORIF, 1 year ago, did not recover well, opted for reverse TSA referred to PT by surgeon to initiate rehab  PAIN:  Are you having pain? Yes: NPRS scale: 0 to 4/10 Pain location: R shoulder Pain description: intermittent pain with movement incorrectly Aggravating factors: lifting out Relieving factors: rest, heat  PRECAUTIONS: Shoulder Avoid IR/ext/add, NO UBE, follow protocol in media WEIGHT BEARING RESTRICTIONS: No  FALLS:  Has patient fallen in last 6 months? No  LIVING ENVIRONMENT: Lives with: lives with their family Lives in: House/apartment Stairs: Yes: Internal: 11 steps; on left going up Has following equipment at home: None  OCCUPATION: na  PLOF: Needs assistance with ADLs and Needs assistance with homemaking  PATIENT GOALS:restore movement R arm to perform basic ADL's  NEXT MD VISIT: unclear  OBJECTIVE:   DIAGNOSTIC FINDINGS:  na  PATIENT SURVEYS:  Quick Dash 68.2   COGNITION: Overall cognitive status: Within functional limits for tasks assessed     SENSATION: WFL  POSTURE: Rounded shoulders, protracted shoulders  UPPER EXTREMITY ROM:   Passive ROM Right eval Left eval  Shoulder flexion 88 40  Shoulder extension    Shoulder abduction  30  Shoulder adduction    Shoulder internal rotation    Shoulder external rotation 20 8  Elbow flexion    Elbow extension    Wrist  flexion    Wrist extension    Wrist ulnar deviation    Wrist radial deviation    Wrist pronation    Wrist supination    (Blank rows = not tested)  UPPER EXTREMITY MMT:  MMT Right eval Left eval  Shoulder flexion 88 2-  Shoulder extension  2-  Shoulder abduction  2-  Shoulder adduction    Shoulder internal rotation    Shoulder external rotation  2-  Middle trapezius    Lower trapezius    Elbow flexion 3 3  Elbow extension 3 3  Wrist flexion    Wrist extension    Wrist ulnar deviation    Wrist radial deviation    Wrist pronation    Wrist supination    Grip strength (lbs)    (Blank rows = not tested)  JOINT MOBILITY TESTING:  na  PALPATION:  Tender along medial upper arm   TODAY'S TREATMENT:                                                                                                                                         DATE: initiated stretching R shoulder today in supine, PROM to tolerance, also initiated HEP see below   PATIENT EDUCATION: Education details: POC, goals Person educated: Patient Education method: Explanation, Demonstration, Tactile cues, Verbal cues, and Handouts Education comprehension: verbalized understanding, returned demonstration, verbal cues required, and tactile cues required  HOME EXERCISE PROGRAM: Access Code: [email or web address] URL: [email or web address] Date: 08/03/2022 Prepared by: Caralee Ates  Exercises -  Standing Isometric Shoulder Extension with Doorway - Arm Bent  - 1 x daily - 7 x weekly - 3 sets - 10 reps - Standing Isometric Shoulder Abduction with Doorway - Arm Bent  - 1 x daily - 7 x weekly - 3 sets - 10 reps - Standing Isometric Shoulder Flexion with Doorway - Arm Bent  - 1 x daily - 7 x weekly - 3 sets - 10 reps - Seated Shoulder Flexion Slide at Table Top with Forearm in Neutral  - 1 x daily - 7 x weekly - 3 sets - 10 reps - Circular Shoulder Pendulum with Table Support  - 1 x daily - 7 x weekly - 3 sets - 10  reps  ASSESSMENT:  CLINICAL IMPRESSION: Patient is a 32 y.o. F who was seen today for physical therapy evaluation and treatment for initiation of rehab following R reverse TSA.  She has multiple co morbidities which contributed to her original injury R shoulder.  Following surgical protocol., has decreased strength, Rom, altered posture and some pain R shoulder as expected but tolerated initial session very well.  .   OBJECTIVE IMPAIRMENTS: decreased coordination, decreased ROM, decreased strength, impaired flexibility, impaired UE functional use, and pain.   ACTIVITY LIMITATIONS: carrying, lifting, sleeping, bed mobility, bathing, dressing, reach over head, and hygiene/grooming  PARTICIPATION LIMITATIONS: meal prep, cleaning, laundry, driving, and yard work  PERSONAL FACTORS: Fitness, Past/current experiences, Time since onset of injury/illness/exacerbation, and 3+ comorbidities: lupus, dialysis due to renal disease, h/o seizure with B shoulder fxs  are also affecting patient's functional outcome.   REHAB POTENTIAL: Good  CLINICAL DECISION MAKING: Evolving/moderate complexity  EVALUATION COMPLEXITY: Moderate   GOALS: Goals reviewed with patient? Yes  SHORT TERM GOALS: Target date: 2 weeks 712/24  I HEP Baseline:initiated at eval Goal status: INITIAL  LONG TERM GOALS: Target date: 10/26/22  Quick DASH 40% diability Baseline: 68% Goal status: INITIAL  2.  Improve AROM R shoulder elevation to 130, IR and ER 45 for efficient ADL's Baseline: PROM elevation 88, Er 20 Goal status: INITIAL  3.  Improve R biceps, triceps, shoulder strength to 4/5 all plane except Er 3+/5 Baseline: 1/5 shoulder, 3/5 elbow Goal status: INITIAL  PLAN:  PT FREQUENCY: 2x/week  PT DURATION: 12 weeks  PLANNED INTERVENTIONS: Therapeutic exercises, Therapeutic activity, Neuromuscular re-education, Balance training, Gait training, Patient/Family education, and Joint mobilization  PLAN FOR NEXT  SESSION: how were therex, follow protocol to advance, use heat, ice as needed   Jayda White L Ferne [place], PT 08/03/2022, 5:59 PM

## 2022-08-09 ENCOUNTER — Ambulatory Visit: Payer: Medicare HMO | Admitting: Physical Therapy

## 2022-08-11 ENCOUNTER — Ambulatory Visit: Payer: Medicare HMO | Attending: Acute Care | Admitting: Physical Therapy

## 2022-08-11 DIAGNOSIS — M25611 Stiffness of right shoulder, not elsewhere classified: Secondary | ICD-10-CM | POA: Insufficient documentation

## 2022-08-11 DIAGNOSIS — M25612 Stiffness of left shoulder, not elsewhere classified: Secondary | ICD-10-CM | POA: Diagnosis present

## 2022-08-11 DIAGNOSIS — R29898 Other symptoms and signs involving the musculoskeletal system: Secondary | ICD-10-CM | POA: Diagnosis present

## 2022-08-11 DIAGNOSIS — M6281 Muscle weakness (generalized): Secondary | ICD-10-CM | POA: Diagnosis present

## 2022-08-11 DIAGNOSIS — Z96611 Presence of right artificial shoulder joint: Secondary | ICD-10-CM | POA: Diagnosis present

## 2022-08-11 NOTE — Therapy (Signed)
OUTPATIENT PHYSICAL THERAPY SHOULDER    Patient Name: Anne Robinson MRN: [id number] DOB:[birth year], 33 y.o., female Today's Date: 08/11/2022  END OF SESSION:  PT End of Session - 08/11/22 1401     Visit Number 2    Date for PT Re-Evaluation 10/26/22    Authorization Type MCR and UHC    PT Start Time 1400    PT Stop Time 1445    PT Time Calculation (min) 45 min             Past Medical History:  Diagnosis Date   HTN (hypertension) 05/15/2016   Lupus (HCC)    Lupus nephritis (HCC)    Pseudotumor cerebri    Renal disorder    Thyroid disease    Past Surgical History:  Procedure Laterality Date   INSERTION OF DIALYSIS CATHETER     RENAL BIOPSY     SHOULDER SURGERY     VENTRICULAR ATRIAL SHUNT Right 11/27/2018   Codman programmable shunt set at 150   Patient Active Problem List   Diagnosis Date Noted   ESRD on dialysis (HCC) 09/15/2021   Idiopathic intracranial hypertension 09/15/2021   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   Anemia of chronic renal failure 09/15/2021   History of DVT (deep vein thrombosis) 09/15/2021   Thrombocytopenia (HCC) 09/15/2021   Hypothyroidism 09/15/2021   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   Complicated migraine 09/14/2021   Cellulitis of chest wall    Acute renal failure (HCC) 01/26/2017   Exacerbation of systemic lupus erythematosus (HCC) 01/26/2017   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   Tachypnea 01/24/2017   Dyspnea 01/24/2017   Macular rash 01/24/2017   Abscess of left axilla 01/24/2017   Abscess of axilla, left 01/23/2017   Hypotension    Sepsis (HCC) 05/15/2016   Elevated troponin 05/15/2016   HTN (hypertension) 05/15/2016   SLE (systemic lupus erythematosus) (HCC) 05/15/2016    PCP: Ninetta Lights, MD  REFERRING PROVIDER: Sula Soda, NP  REFERRING DIAG: s/ p R reverse TSA  THERAPY DIAG:  Stiffness of right shoulder, not elsewhere classified  Status post reverse total replacement of right  shoulder  Muscle weakness (generalized)  Rationale for Evaluation and Treatment: Rehabilitation  ONSET DATE: 06/22/22  SUBJECTIVE:                                                                                                                                                                                      SUBJECTIVE STATEMENT: Feel much better immediately after surgery Hand dominance: Right  PERTINENT HISTORY: Suffered B shoulder fractures, had B ORIF, 1 year ago, did not recover well, opted for reverse TSA referred to PT  by surgeon to initiate rehab  PAIN:  Are you having pain? Yes: NPRS scale: 0 to 4/10 Pain location: R shoulder Pain description: intermittent pain with movement incorrectly Aggravating factors: lifting out Relieving factors: rest, heat  PRECAUTIONS: Shoulder Avoid IR/ext/add, NO UBE, follow protocol in media WEIGHT BEARING RESTRICTIONS: No  FALLS:  Has patient fallen in last 6 months? No  LIVING ENVIRONMENT: Lives with: lives with their family Lives in: House/apartment Stairs: Yes: Internal: 11 steps; on left going up Has following equipment at home: None  OCCUPATION: na  PLOF: Needs assistance with ADLs and Needs assistance with homemaking  PATIENT GOALS:restore movement R arm to perform basic ADL's  NEXT MD VISIT: unclear  OBJECTIVE:   DIAGNOSTIC FINDINGS:  na  PATIENT SURVEYS:  Quick Dash 68.2   COGNITION: Overall cognitive status: Within functional limits for tasks assessed     SENSATION: WFL  POSTURE: Rounded shoulders, protracted shoulders  UPPER EXTREMITY ROM:   Passive ROM Right eval Left eval  Shoulder flexion 88 40  Shoulder extension    Shoulder abduction  30  Shoulder adduction    Shoulder internal rotation    Shoulder external rotation 20 8  Elbow flexion    Elbow extension    Wrist flexion    Wrist extension    Wrist ulnar deviation    Wrist radial deviation    Wrist pronation    Wrist supination     (Blank rows = not tested)  UPPER EXTREMITY MMT:  MMT Right eval Left eval  Shoulder flexion 88 2-  Shoulder extension  2-  Shoulder abduction  2-  Shoulder adduction    Shoulder internal rotation    Shoulder external rotation  2-  Middle trapezius    Lower trapezius    Elbow flexion 3 3  Elbow extension 3 3  Wrist flexion    Wrist extension    Wrist ulnar deviation    Wrist radial deviation    Wrist pronation    Wrist supination    Grip strength (lbs)    (Blank rows = not tested)  JOINT MOBILITY TESTING:  na  PALPATION:  Tender along medial upper arm   TODAY'S TREATMENT:                                                                                                                                         DATE: initiated stretching R shoulder today in supine, PROM to tolerance, also initiated HEP see below   PATIENT EDUCATION: Education details: POC, goals Person educated: Patient Education method: Explanation, Demonstration, Tactile cues, Verbal cues, and Handouts Education comprehension: verbalized understanding, returned demonstration, verbal cues required, and tactile cues required  HOME EXERCISE PROGRAM: Access Code: [email or web address] URL: [email or web address] Date: 08/03/2022 Prepared by: Caralee Ates  Exercises - Standing Isometric Shoulder Extension with Doorway - Arm Bent  - 1 x daily - 7 x weekly - 3 sets -  10 reps - Standing Isometric Shoulder Abduction with Doorway - Arm Bent  - 1 x daily - 7 x weekly - 3 sets - 10 reps - Standing Isometric Shoulder Flexion with Doorway - Arm Bent  - 1 x daily - 7 x weekly - 3 sets - 10 reps - Seated Shoulder Flexion Slide at Table Top with Forearm in Neutral  - 1 x daily - 7 x weekly - 3 sets - 10 reps - Circular Shoulder Pendulum with Table Support  - 1 x daily - 7 x weekly - 3 sets - 10 reps  ASSESSMENT:  CLINICAL IMPRESSION: Patient is a 32 y.o. F who was seen today for physical therapy evaluation and  treatment for initiation of rehab following R reverse TSA.  She has multiple co morbidities which contributed to her original injury R shoulder.  Following surgical protocol., has decreased strength, Rom, altered posture and some pain R shoulder as expected but tolerated initial session very well.  .   OBJECTIVE IMPAIRMENTS: decreased coordination, decreased ROM, decreased strength, impaired flexibility, impaired UE functional use, and pain.   ACTIVITY LIMITATIONS: carrying, lifting, sleeping, bed mobility, bathing, dressing, reach over head, and hygiene/grooming  PARTICIPATION LIMITATIONS: meal prep, cleaning, laundry, driving, and yard work  PERSONAL FACTORS: Fitness, Past/current experiences, Time since onset of injury/illness/exacerbation, and 3+ comorbidities: lupus, dialysis due to renal disease, h/o seizure with B shoulder fxs  are also affecting patient's functional outcome.   REHAB POTENTIAL: Good  CLINICAL DECISION MAKING: Evolving/moderate complexity  EVALUATION COMPLEXITY: Moderate   GOALS: Goals reviewed with patient? Yes  SHORT TERM GOALS: Target date: 2 weeks 712/24  I HEP Baseline:initiated at eval Goal status: INITIAL  LONG TERM GOALS: Target date: 10/26/22  Quick DASH 40% diability Baseline: 68% Goal status: INITIAL  2.  Improve AROM R shoulder elevation to 130, IR and ER 45 for efficient ADL's Baseline: PROM elevation 88, Er 20 Goal status: INITIAL  3.  Improve R biceps, triceps, shoulder strength to 4/5 all plane except Er 3+/5 Baseline: 1/5 shoulder, 3/5 elbow Goal status: INITIAL  PLAN:  PT FREQUENCY: 2x/week  PT DURATION: 12 weeks  PLANNED INTERVENTIONS: Therapeutic exercises, Therapeutic activity, Neuromuscular re-education, Balance training, Gait training, Patient/Family education, and Joint mobilization  PLAN FOR NEXT SESSION: how were therex, follow protocol to advance, use heat, ice as needed   Nikolina Simerson,ANGIE, PTA 08/11/2022, 2:01 PM [facility] [address] [facility] [place], [place], [address] Phone: [phone]   Fax:  [phone]  Patient Details  Name: Anne Robinson MRN: [id number] Date of Birth: Feb 24, 1991 Referring Provider:  Sula Soda, NP  Encounter Date: 08/11/2022   Suanne Marker, PTA 08/11/2022, 2:01 PM  [facility] [address] [place], [place], [address] Phone: [phone]   Fax:  [phone]

## 2022-08-11 NOTE — Therapy (Signed)
OUTPATIENT PHYSICAL THERAPY SHOULDER    Patient Name: Anne Robinson MRN: [id number] DOB:October 23, 1990, 32 y.o., female Today's Date: 08/11/2022  END OF SESSION:  PT End of Session - 08/11/22 1401     Visit Number 2    Date for PT Re-Evaluation 10/26/22    Authorization Type MCR and UHC    PT Start Time 1400    PT Stop Time 1445    PT Time Calculation (min) 45 min             Past Medical History:  Diagnosis Date   HTN (hypertension) 05/15/2016   Lupus (HCC)    Lupus nephritis (HCC)    Pseudotumor cerebri    Renal disorder    Thyroid disease    Past Surgical History:  Procedure Laterality Date   INSERTION OF DIALYSIS CATHETER     RENAL BIOPSY     SHOULDER SURGERY     VENTRICULAR ATRIAL SHUNT Right 11/27/2018   Codman programmable shunt set at 150   Patient Active Problem List   Diagnosis Date Noted   ESRD on dialysis (HCC) 09/15/2021   Idiopathic intracranial hypertension 09/15/2021   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   Anemia of chronic renal failure 09/15/2021   History of DVT (deep vein thrombosis) 09/15/2021   Thrombocytopenia (HCC) 09/15/2021   Hypothyroidism 09/15/2021   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   Complicated migraine 09/14/2021   Cellulitis of chest wall    Acute renal failure (HCC) 01/26/2017   Exacerbation of systemic lupus erythematosus (HCC) 01/26/2017   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   Tachypnea 01/24/2017   Dyspnea 01/24/2017   Macular rash 01/24/2017   Abscess of left axilla 01/24/2017   Abscess of axilla, left 01/23/2017   Hypotension    Sepsis (HCC) 05/15/2016   Elevated troponin 05/15/2016   HTN (hypertension) 05/15/2016   SLE (systemic lupus erythematosus) (HCC) 05/15/2016    PCP: Ninetta Lights, MD  REFERRING PROVIDER: Sula Soda, NP  REFERRING DIAG: s/ p R reverse TSA  THERAPY DIAG:  Stiffness of right shoulder, not elsewhere classified  Status post reverse total replacement of right  shoulder  Muscle weakness (generalized)  Rationale for Evaluation and Treatment: Rehabilitation  ONSET DATE: 06/22/22  SUBJECTIVE:                                                                                                                                                                                      SUBJECTIVE STATEMENT: Didn't really accomplish much of HEP. RT shld 4/10, left 7/10 ( amb in with left shld non surgical more guarded and UT engaged) Hand dominance: Right  PERTINENT HISTORY: Suffered B shoulder  fractures, had B ORIF, 1 year ago, did not recover well, opted for reverse TSA referred to PT by surgeon to initiate rehab  PAIN:  Are you having pain? Yes: NPRS scale: 0 to 4/10 Pain location: R shoulder Pain description: intermittent pain with movement incorrectly Aggravating factors: lifting out Relieving factors: rest, heat  PRECAUTIONS: Shoulder Avoid IR/ext/add, NO UBE, follow protocol in media WEIGHT BEARING RESTRICTIONS: No  FALLS:  Has patient fallen in last 6 months? No  LIVING ENVIRONMENT: Lives with: lives with their family Lives in: House/apartment Stairs: Yes: Internal: 11 steps; on left going up Has following equipment at home: None  OCCUPATION: na  PLOF: Needs assistance with ADLs and Needs assistance with homemaking  PATIENT GOALS:restore movement R arm to perform basic ADL's  NEXT MD VISIT: unclear  OBJECTIVE:   DIAGNOSTIC FINDINGS:  na  PATIENT SURVEYS:  Quick Dash 68.2   COGNITION: Overall cognitive status: Within functional limits for tasks assessed     SENSATION: WFL  POSTURE: Rounded shoulders, protracted shoulders  UPPER EXTREMITY ROM:   Passive ROM Right eval Left eval  Shoulder flexion 88 40  Shoulder extension    Shoulder abduction  30  Shoulder adduction    Shoulder internal rotation    Shoulder external rotation 20 8  Elbow flexion    Elbow extension    Wrist flexion    Wrist extension    Wrist  ulnar deviation    Wrist radial deviation    Wrist pronation    Wrist supination    (Blank rows = not tested)  UPPER EXTREMITY MMT:  MMT Right eval Left eval  Shoulder flexion 88 2-  Shoulder extension  2-  Shoulder abduction  2-  Shoulder adduction    Shoulder internal rotation    Shoulder external rotation  2-  Middle trapezius    Lower trapezius    Elbow flexion 3 3  Elbow extension 3 3  Wrist flexion    Wrist extension    Wrist ulnar deviation    Wrist radial deviation    Wrist pronation    Wrist supination    Grip strength (lbs)    (Blank rows = not tested)  JOINT MOBILITY TESTING:  na  PALPATION:  Tender along medial upper arm   TODAY'S TREATMENT:                                                                                                                                         DATE:   08/11/22 Nustep L 4 5 min for UE ROM Rolling ball on mat multi directional Finger ladder 5 x each way 1# cane ex standing 10 x each shld flex,ER, bicep curl, ext and IR PROM RT shld supine 10 x each way 2 sets Isometric RT shld 10 x each way hold 3 sec- pt is very weak AAROM 10 x each way 2 sets IR,ER,abd,chest press  and flex ( popping with flexion) 2# shruggs,backward rolls and scap squeeze 10 x     Eval initiated stretching R shoulder today in supine, PROM to tolerance, also initiated HEP see below   PATIENT EDUCATION: Education details: POC, goals Person educated: Patient Education method: Explanation, Demonstration, Tactile cues, Verbal cues, and Handouts Education comprehension: verbalized understanding, returned demonstration, verbal cues required, and tactile cues required  HOME EXERCISE PROGRAM: Access Code: [email or web address] URL: [email or web address] Date: 08/03/2022 Prepared by: Caralee Ates  Exercises - Standing Isometric Shoulder Extension with Doorway - Arm Bent  - 1 x daily - 7 x weekly - 3 sets - 10 reps - Standing Isometric Shoulder  Abduction with Doorway - Arm Bent  - 1 x daily - 7 x weekly - 3 sets - 10 reps - Standing Isometric Shoulder Flexion with Doorway - Arm Bent  - 1 x daily - 7 x weekly - 3 sets - 10 reps - Seated Shoulder Flexion Slide at Table Top with Forearm in Neutral  - 1 x daily - 7 x weekly - 3 sets - 10 reps - Circular Shoulder Pendulum with Table Support  - 1 x daily - 7 x weekly - 3 sets - 10 reps  ASSESSMENT:  CLINICAL IMPRESSION:  OBJECTIVE IMPAIRMENTS: decreased coordination, decreased ROM, decreased strength, impaired flexibility, impaired UE functional use, and pain.   ACTIVITY LIMITATIONS: carrying, lifting, sleeping, bed mobility, bathing, dressing, reach over head, and hygiene/grooming  PARTICIPATION LIMITATIONS: meal prep, cleaning, laundry, driving, and yard work  PERSONAL FACTORS: Fitness, Past/current experiences, Time since onset of injury/illness/exacerbation, and 3+ comorbidities: lupus, dialysis due to renal disease, h/o seizure with B shoulder fxs  are also affecting patient's functional outcome.   REHAB POTENTIAL: Good  CLINICAL DECISION MAKING: Evolving/moderate complexity  EVALUATION COMPLEXITY: Moderate   GOALS: Goals reviewed with patient? Yes  SHORT TERM GOALS: Target date: 2 weeks 712/24  I HEP Baseline:initiated at eval Goal status: INITIAL  LONG TERM GOALS: Target date: 10/26/22  Quick DASH 40% diability Baseline: 68% Goal status: INITIAL  2.  Improve AROM R shoulder elevation to 130, IR and ER 45 for efficient ADL's Baseline: PROM elevation 88, Er 20 Goal status: INITIAL  3.  Improve R biceps, triceps, shoulder strength to 4/5 all plane except Er 3+/5 Baseline: 1/5 shoulder, 3/5 elbow Goal status: INITIAL  PLAN:  PT FREQUENCY: 2x/week  PT DURATION: 12 weeks  PLANNED INTERVENTIONS: Therapeutic exercises, Therapeutic activity, Neuromuscular re-education, Balance training, Gait training, Patient/Family education, and Joint mobilization  PLAN FOR  NEXT SESSION: how were therex, follow protocol to advance, use heat, ice as needed   Hamdi Vari,ANGIE, PTA 08/11/2022, 2:01 PM [place] [facility] at [facility] [address] [facility] [place], [place], [address] Phone: [phone]   Fax:  [phone]  Patient Details  Name: Anne Robinson MRN: [id number] Date of Birth: 11-23-1990 Referring Provider:  Sula Soda, NP  Encounter Date: 08/11/2022   Suanne Marker, PTA 08/11/2022, 2:01 PM  [place] [facility] at [facility] [address] [place], [place], [address] Phone: [phone]   Fax:  [phone]

## 2022-08-16 ENCOUNTER — Ambulatory Visit: Payer: Medicare HMO | Admitting: Physical Therapy

## 2022-08-16 DIAGNOSIS — Z96611 Presence of right artificial shoulder joint: Secondary | ICD-10-CM

## 2022-08-16 DIAGNOSIS — M25611 Stiffness of right shoulder, not elsewhere classified: Secondary | ICD-10-CM | POA: Diagnosis not present

## 2022-08-16 DIAGNOSIS — M6281 Muscle weakness (generalized): Secondary | ICD-10-CM

## 2022-08-16 NOTE — Therapy (Signed)
OUTPATIENT PHYSICAL THERAPY SHOULDER    Patient Name: Anne Robinson MRN: [id number] DOB:1991-03-02, 32 y.o., female Today's Date: 08/16/2022  END OF SESSION:  PT End of Session - 08/16/22 1317     Visit Number 3    Date for PT Re-Evaluation 10/26/22    Authorization Type MCR and UHC    PT Start Time 1315    PT Stop Time 1400    PT Time Calculation (min) 45 min             Past Medical History:  Diagnosis Date   HTN (hypertension) 05/15/2016   Lupus (HCC)    Lupus nephritis (HCC)    Pseudotumor cerebri    Renal disorder    Thyroid disease    Past Surgical History:  Procedure Laterality Date   INSERTION OF DIALYSIS CATHETER     RENAL BIOPSY     SHOULDER SURGERY     VENTRICULAR ATRIAL SHUNT Right 11/27/2018   Codman programmable shunt set at 150   Patient Active Problem List   Diagnosis Date Noted   ESRD on dialysis (HCC) 09/15/2021   Idiopathic intracranial hypertension 09/15/2021   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   Anemia of chronic renal failure 09/15/2021   History of DVT (deep vein thrombosis) 09/15/2021   Thrombocytopenia (HCC) 09/15/2021   Hypothyroidism 09/15/2021   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   Complicated migraine 09/14/2021   Cellulitis of chest wall    Acute renal failure (HCC) 01/26/2017   Exacerbation of systemic lupus erythematosus (HCC) 01/26/2017   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   Tachypnea 01/24/2017   Dyspnea 01/24/2017   Macular rash 01/24/2017   Abscess of left axilla 01/24/2017   Abscess of axilla, left 01/23/2017   Hypotension    Sepsis (HCC) 05/15/2016   Elevated troponin 05/15/2016   HTN (hypertension) 05/15/2016   SLE (systemic lupus erythematosus) (HCC) 05/15/2016    PCP: Ninetta Lights, MD  REFERRING PROVIDER: Sula Soda, NP  REFERRING DIAG: s/ p R reverse TSA  THERAPY DIAG:  Stiffness of right shoulder, not elsewhere classified  Status post reverse total replacement of right  shoulder  Muscle weakness (generalized)  Rationale for Evaluation and Treatment: Rehabilitation  ONSET DATE: 06/22/22  SUBJECTIVE:                                                                                                                                                                                      SUBJECTIVE STATEMENT: Did okay after last session. A bit stiff today  Hand dominance: Right  PERTINENT HISTORY: Suffered B shoulder fractures, had B ORIF, 1 year ago, did not recover well, opted for reverse  TSA referred to PT by surgeon to initiate rehab  PAIN:  Are you having pain? Yes: NPRS scale: 0 to 4/10 Pain location: R shoulder Pain description: intermittent pain with movement incorrectly Aggravating factors: lifting out Relieving factors: rest, heat  PRECAUTIONS: Shoulder Avoid IR/ext/add, NO UBE, follow protocol in media WEIGHT BEARING RESTRICTIONS: No  FALLS:  Has patient fallen in last 6 months? No  LIVING ENVIRONMENT: Lives with: lives with their family Lives in: House/apartment Stairs: Yes: Internal: 11 steps; on left going up Has following equipment at home: None  OCCUPATION: na  PLOF: Needs assistance with ADLs and Needs assistance with homemaking  PATIENT GOALS:restore movement R arm to perform basic ADL's  NEXT MD VISIT: unclear  OBJECTIVE:   DIAGNOSTIC FINDINGS:  na  PATIENT SURVEYS:  Quick Dash 68.2   COGNITION: Overall cognitive status: Within functional limits for tasks assessed     SENSATION: WFL  POSTURE: Rounded shoulders, protracted shoulders  UPPER EXTREMITY ROM:   Passive ROM Right eval Left eval  Shoulder flexion 88 40  Shoulder extension    Shoulder abduction  30  Shoulder adduction    Shoulder internal rotation    Shoulder external rotation 20 8  Elbow flexion    Elbow extension    Wrist flexion    Wrist extension    Wrist ulnar deviation    Wrist radial deviation    Wrist pronation    Wrist  supination    (Blank rows = not tested)  UPPER EXTREMITY MMT:  MMT Right eval Left eval  Shoulder flexion 88 2-  Shoulder extension  2-  Shoulder abduction  2-  Shoulder adduction    Shoulder internal rotation    Shoulder external rotation  2-  Middle trapezius    Lower trapezius    Elbow flexion 3 3  Elbow extension 3 3  Wrist flexion    Wrist extension    Wrist ulnar deviation    Wrist radial deviation    Wrist pronation    Wrist supination    Grip strength (lbs)    (Blank rows = not tested)  JOINT MOBILITY TESTING:  na  PALPATION:  Tender along medial upper arm   TODAY'S TREATMENT:                                                                                                                                         DATE:  08/16/22 UBE L 1 2 min fwd,rest, 2 min back Finger ladder flex and abd 5 x each 2# cane bicep curl 2 sets 10 2# cane ex standing 12 x each shld flex,ER, ext and IR Isometric supine shld ex all directions- very weak PROM RT UE AAROM supine Nustep 5 min UE only  08/11/22 Nustep L 4 5 min for UE ROM Rolling ball on mat multi directional Finger ladder 5 x each way 1# cane ex standing 10 x  each shld flex,ER, bicep curl, ext and IR PROM RT shld supine 10 x each way 2 sets Isometric RT shld 10 x each way hold 3 sec- pt is very weak AAROM 10 x each way 2 sets IR,ER,abd,chest press and flex ( popping with flexion) 2# shruggs,backward rolls and scap squeeze 10 x     Eval initiated stretching R shoulder today in supine, PROM to tolerance, also initiated HEP see below   PATIENT EDUCATION: Education details: POC, goals Person educated: Patient Education method: Explanation, Demonstration, Tactile cues, Verbal cues, and Handouts Education comprehension: verbalized understanding, returned demonstration, verbal cues required, and tactile cues required  HOME EXERCISE PROGRAM: Access Code: [email or web address] URL: [email or web address] Date:  08/03/2022 Prepared by: Caralee Ates  Exercises - Standing Isometric Shoulder Extension with Doorway - Arm Bent  - 1 x daily - 7 x weekly - 3 sets - 10 reps - Standing Isometric Shoulder Abduction with Doorway - Arm Bent  - 1 x daily - 7 x weekly - 3 sets - 10 reps - Standing Isometric Shoulder Flexion with Doorway - Arm Bent  - 1 x daily - 7 x weekly - 3 sets - 10 reps - Seated Shoulder Flexion Slide at Table Top with Forearm in Neutral  - 1 x daily - 7 x weekly - 3 sets - 10 reps - Circular Shoulder Pendulum with Table Support  - 1 x daily - 7 x weekly - 3 sets - 10 reps  ASSESSMENT:  CLINICAL IMPRESSION: progressing ROM and strengthening within protocol. ROM is good but weakness is a major limiting factor in ROM OBJECTIVE IMPAIRMENTS: decreased coordination, decreased ROM, decreased strength, impaired flexibility, impaired UE functional use, and pain.   ACTIVITY LIMITATIONS: carrying, lifting, sleeping, bed mobility, bathing, dressing, reach over head, and hygiene/grooming  PARTICIPATION LIMITATIONS: meal prep, cleaning, laundry, driving, and yard work  PERSONAL FACTORS: Fitness, Past/current experiences, Time since onset of injury/illness/exacerbation, and 3+ comorbidities: lupus, dialysis due to renal disease, h/o seizure with B shoulder fxs  are also affecting patient's functional outcome.   REHAB POTENTIAL: Good  CLINICAL DECISION MAKING: Evolving/moderate complexity  EVALUATION COMPLEXITY: Moderate   GOALS: Goals reviewed with patient? Yes  SHORT TERM GOALS: Target date: 2 weeks 712/24  I HEP Baseline:initiated at eval Goal status: INITIAL  LONG TERM GOALS: Target date: 10/26/22  Quick DASH 40% diability Baseline: 68% Goal status: INITIAL  2.  Improve AROM R shoulder elevation to 130, IR and ER 45 for efficient ADL's Baseline: PROM elevation 88, Er 20 Goal status: INITIAL  3.  Improve R biceps, triceps, shoulder strength to 4/5 all plane except Er 3+/5 Baseline:  1/5 shoulder, 3/5 elbow Goal status: INITIAL  PLAN:  PT FREQUENCY: 2x/week  PT DURATION: 12 weeks  PLANNED INTERVENTIONS: Therapeutic exercises, Therapeutic activity, Neuromuscular re-education, Balance training, Gait training, Patient/Family education, and Joint mobilization  PLAN FOR NEXT SESSION: how were therex, follow protocol to advance, use heat, ice as needed   Promiss Labarbera,ANGIE, PTA 08/16/2022, 1:18 PM [place] [facility] at [facility] [address] [facility]. [place], [place], [address] Phone: [phone]   Fax:  [phone]  Patient Details  Name: JERSEY GARBUTT MRN: [id number] Date of Birth: 1990/10/04 Referring Provider:  Sula Soda, NP  Encounter Date: 08/16/2022   Suanne Marker, PTA 08/16/2022, 1:18 PM  [place] [facility] at [facility] [address] [facility]. [place], [place], [address] Phone: [phone]   Fax:  [phone] at [facility]  [address] [place], [place], [address] Phone: [phone]   Fax:  [phone]  Patient Details  Name: DEARIE MCCAGE MRN: [id number] Date of Birth: 18-Dec-1990 Referring Provider:  Sula Soda, NP  Encounter Date: 08/16/2022   Suanne Marker, PTA 08/16/2022, 1:18 PM  [place] [facility] at [facility] [address] [facility]. [place], [place], [address] Phone: [phone]   Fax:  [phone]

## 2022-08-18 ENCOUNTER — Ambulatory Visit: Payer: Medicare HMO | Admitting: Physical Therapy

## 2022-08-18 DIAGNOSIS — M25611 Stiffness of right shoulder, not elsewhere classified: Secondary | ICD-10-CM | POA: Diagnosis not present

## 2022-08-18 DIAGNOSIS — Z96611 Presence of right artificial shoulder joint: Secondary | ICD-10-CM

## 2022-08-18 DIAGNOSIS — M6281 Muscle weakness (generalized): Secondary | ICD-10-CM

## 2022-08-18 NOTE — Therapy (Signed)
OUTPATIENT PHYSICAL THERAPY SHOULDER    Patient Name: Anne Robinson MRN: [id number] DOB:30-Aug-1990, 32 y.o., female Today's Date: 08/18/2022  END OF SESSION:  PT End of Session - 08/18/22 1402     Visit Number 4    Date for PT Re-Evaluation 10/26/22    Authorization Type MCR and UHC    PT Start Time 1400    PT Stop Time 1445    PT Time Calculation (min) 45 min             Past Medical History:  Diagnosis Date   HTN (hypertension) 05/15/2016   Lupus (HCC)    Lupus nephritis (HCC)    Pseudotumor cerebri    Renal disorder    Thyroid disease    Past Surgical History:  Procedure Laterality Date   INSERTION OF DIALYSIS CATHETER     RENAL BIOPSY     SHOULDER SURGERY     VENTRICULAR ATRIAL SHUNT Right 11/27/2018   Codman programmable shunt set at 150   Patient Active Problem List   Diagnosis Date Noted   ESRD on dialysis (HCC) 09/15/2021   Idiopathic intracranial hypertension 09/15/2021   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   Anemia of chronic renal failure 09/15/2021   History of DVT (deep vein thrombosis) 09/15/2021   Thrombocytopenia (HCC) 09/15/2021   Hypothyroidism 09/15/2021   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   Complicated migraine 09/14/2021   Cellulitis of chest wall    Acute renal failure (HCC) 01/26/2017   Exacerbation of systemic lupus erythematosus (HCC) 01/26/2017   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   Tachypnea 01/24/2017   Dyspnea 01/24/2017   Macular rash 01/24/2017   Abscess of left axilla 01/24/2017   Abscess of axilla, left 01/23/2017   Hypotension    Sepsis (HCC) 05/15/2016   Elevated troponin 05/15/2016   HTN (hypertension) 05/15/2016   SLE (systemic lupus erythematosus) (HCC) 05/15/2016    PCP: Ninetta Lights, MD  REFERRING PROVIDER: Sula Soda, NP  REFERRING DIAG: s/ p R reverse TSA  THERAPY DIAG:  Stiffness of right shoulder, not elsewhere classified  Muscle weakness (generalized)  Status post reverse total  replacement of right shoulder  Rationale for Evaluation and Treatment: Rehabilitation  ONSET DATE: 06/22/22  SUBJECTIVE:                                                                                                                                                                                      SUBJECTIVE STATEMENT: Shld is okay, more achey at night. Back is bothering me. So fatigue today Hand dominance: Right  PERTINENT HISTORY: Suffered B shoulder fractures, had B ORIF, 1 year ago, did not recover  well, opted for reverse TSA referred to PT by surgeon to initiate rehab  PAIN:  Are you having pain? Yes: NPRS scale: 0 to 4/10 Pain location: R shoulder Pain description: intermittent pain with movement incorrectly Aggravating factors: lifting out Relieving factors: rest, heat  PRECAUTIONS: Shoulder Avoid IR/ext/add, NO UBE, follow protocol in media WEIGHT BEARING RESTRICTIONS: No  FALLS:  Has patient fallen in last 6 months? No  LIVING ENVIRONMENT: Lives with: lives with their family Lives in: House/apartment Stairs: Yes: Internal: 11 steps; on left going up Has following equipment at home: None  OCCUPATION: na  PLOF: Needs assistance with ADLs and Needs assistance with homemaking  PATIENT GOALS:restore movement R arm to perform basic ADL's  NEXT MD VISIT: unclear  OBJECTIVE:   DIAGNOSTIC FINDINGS:  na  PATIENT SURVEYS:  Quick Dash 68.2   COGNITION: Overall cognitive status: Within functional limits for tasks assessed     SENSATION: WFL  POSTURE: Rounded shoulders, protracted shoulders  UPPER EXTREMITY ROM:   Passive ROM Right eval Left eval RT supine  Act/Pass 08/18/22  Shoulder flexion 88 40 0/WFLs  Shoulder extension     Shoulder abduction  30 76/WFLs  Shoulder adduction     Shoulder internal rotation   73/85  Shoulder external rotation 20 8 60/85  Elbow flexion     Elbow extension     Wrist flexion     Wrist extension     Wrist ulnar  deviation     Wrist radial deviation     Wrist pronation     Wrist supination     (Blank rows = not tested)  UPPER EXTREMITY MMT:  MMT Right eval Left eval  Shoulder flexion 88 2-  Shoulder extension  2-  Shoulder abduction  2-  Shoulder adduction    Shoulder internal rotation    Shoulder external rotation  2-  Middle trapezius    Lower trapezius    Elbow flexion 3 3  Elbow extension 3 3  Wrist flexion    Wrist extension    Wrist ulnar deviation    Wrist radial deviation    Wrist pronation    Wrist supination    Grip strength (lbs)    (Blank rows = not tested)  JOINT MOBILITY TESTING:  na  PALPATION:  Tender along medial upper arm   TODAY'S TREATMENT:                                                                                                                                         DATE:   08/18/22 Nustep 6 min PROM with ROM taken. PROM WFLS, active improving with no active flexion AA chest press 2 sets 10 and abd 10  AA ROM all directions Isometric all directions 2# bicep curl with wAte bar 2 sets 10 2# shld ext and IR wAte bar 12 x UBE L 1 2 min fwd,rest, 2 min  back    Nustep 6 min 08/16/22 UBE L 1 2 min fwd,rest, 2 min back Finger ladder flex and abd 5 x each 2# cane bicep curl 2 sets 10 2# cane ex standing 12 x each shld flex,ER, ext and IR Isometric supine shld ex all directions- very weak PROM RT UE AAROM supine Nustep 5 min UE only  08/11/22 Nustep L 4 5 min for UE ROM Rolling ball on mat multi directional Finger ladder 5 x each way 1# cane ex standing 10 x each shld flex,ER, bicep curl, ext and IR PROM RT shld supine 10 x each way 2 sets Isometric RT shld 10 x each way hold 3 sec- pt is very weak AAROM 10 x each way 2 sets IR,ER,abd,chest press and flex ( popping with flexion) 2# shruggs,backward rolls and scap squeeze 10 x     Eval initiated stretching R shoulder today in supine, PROM to tolerance, also initiated HEP see  below   PATIENT EDUCATION: Education details: POC, goals Person educated: Patient Education method: Explanation, Demonstration, Tactile cues, Verbal cues, and Handouts Education comprehension: verbalized understanding, returned demonstration, verbal cues required, and tactile cues required  HOME EXERCISE PROGRAM: Access Code: [email or web address] URL: [email or web address] Date: 08/03/2022 Prepared by: Caralee Ates  Exercises - Standing Isometric Shoulder Extension with Doorway - Arm Bent  - 1 x daily - 7 x weekly - 3 sets - 10 reps - Standing Isometric Shoulder Abduction with Doorway - Arm Bent  - 1 x daily - 7 x weekly - 3 sets - 10 reps - Standing Isometric Shoulder Flexion with Doorway - Arm Bent  - 1 x daily - 7 x weekly - 3 sets - 10 reps - Seated Shoulder Flexion Slide at Table Top with Forearm in Neutral  - 1 x daily - 7 x weekly - 3 sets - 10 reps - Circular Shoulder Pendulum with Table Support  - 1 x daily - 7 x weekly - 3 sets - 10 reps  ASSESSMENT:  CLINICAL IMPRESSION: progressing ROM and strengthening within protocol. ROM is good but weakness is a major limiting factor in ROM- see above chart. Goals assessed OBJECTIVE IMPAIRMENTS: decreased coordination, decreased ROM, decreased strength, impaired flexibility, impaired UE functional use, and pain.   ACTIVITY LIMITATIONS: carrying, lifting, sleeping, bed mobility, bathing, dressing, reach over head, and hygiene/grooming  PARTICIPATION LIMITATIONS: meal prep, cleaning, laundry, driving, and yard work  PERSONAL FACTORS: Fitness, Past/current experiences, Time since onset of injury/illness/exacerbation, and 3+ comorbidities: lupus, dialysis due to renal disease, h/o seizure with B shoulder fxs  are also affecting patient's functional outcome.   REHAB POTENTIAL: Good  CLINICAL DECISION MAKING: Evolving/moderate complexity  EVALUATION COMPLEXITY: Moderate   GOALS: Goals reviewed with patient? Yes  SHORT TERM GOALS:  Target date: 2 weeks 712/24  I HEP Baseline:initiated at eval Goal status: 08/18/22 MET  LONG TERM GOALS: Target date: 10/26/22  Quick DASH 40% diability Baseline: 68% Goal status: INITIAL  2.  Improve AROM R shoulder elevation to 130, IR and ER 45 for efficient ADL's Baseline: PROM elevation 88, Er 20 Goal status: INITIAL  3.  Improve R biceps, triceps, shoulder strength to 4/5 all plane except Er 3+/5 Baseline: 1/5 shoulder, 3/5 elbow Goal status: INITIAL  PLAN:  PT FREQUENCY: 2x/week  PT DURATION: 12 weeks  PLANNED INTERVENTIONS: Therapeutic exercises, Therapeutic activity, Neuromuscular re-education, Balance training, Gait training, Patient/Family education, and Joint mobilization  PLAN FOR NEXT SESSION: how were therex, follow protocol to advance, use heat, ice as  needed   Torie Towle,ANGIE, PTA 08/18/2022, 2:03 PM [facility] Outpatient Rehabilitation at [facility] [address] [place], [place], [address] Phone: [phone]   Fax:  [phone]  Patient Details  Name: Anne Robinson MRN: [id number] Date of Birth: 1990-10-24 Referring Provider:  Sula Soda, NP  Encounter Date: 08/18/2022   Suanne Marker, PTA 08/18/2022, 2:03 PM  [place] [facility] at [facility] [address] [place], [place], [address] Phone: [phone]   Fax:  [phone] at [facility] [address] [facility] [address] [place], [place], [address] Phone: [phone]   Fax:  [phone]  Patient Details  Name: Anne Robinson MRN: [id number] Date of Birth: 1991/02/07 Referring Provider:  Sula Soda, NP  Encounter Date: 08/18/2022   Suanne Marker, PTA 08/18/2022, 2:03 PM  [place] at [facility] [address] [facility] [place], [place], [address] Phone: [phone]   Fax:  [phone] at [facility] [address] [address] [address] [place], [place], [address] Phone: [phone]   Fax:  [phone]  Patient Details  Name: Anne Robinson MRN: [id number] Date of Birth: 03/18/1990 Referring Provider:  Sula Soda, NP  Encounter Date: 08/18/2022   Suanne Marker, PTA 08/18/2022, 2:03 PM  [place] [facility] at [facility] [address] [facility] [place], [place], [address] Phone: [phone]   Fax:  [phone]

## 2022-08-23 ENCOUNTER — Ambulatory Visit: Payer: Medicare HMO | Admitting: Physical Therapy

## 2022-08-23 DIAGNOSIS — M25611 Stiffness of right shoulder, not elsewhere classified: Secondary | ICD-10-CM | POA: Diagnosis not present

## 2022-08-23 DIAGNOSIS — M6281 Muscle weakness (generalized): Secondary | ICD-10-CM

## 2022-08-23 DIAGNOSIS — Z96611 Presence of right artificial shoulder joint: Secondary | ICD-10-CM

## 2022-08-23 NOTE — Therapy (Signed)
OUTPATIENT PHYSICAL THERAPY SHOULDER    Patient Name: Anne Robinson MRN: [id number] DOB:03-13-1990, 32 y.o., female Today's Date: 08/23/2022  END OF SESSION:  PT End of Session - 08/23/22 1233     Visit Number 5    Date for PT Re-Evaluation 10/26/22    Authorization Type MCR and UHC    PT Start Time 1230    PT Stop Time 1310    PT Time Calculation (min) 40 min             Past Medical History:  Diagnosis Date   HTN (hypertension) 05/15/2016   Lupus (HCC)    Lupus nephritis (HCC)    Pseudotumor cerebri    Renal disorder    Thyroid disease    Past Surgical History:  Procedure Laterality Date   INSERTION OF DIALYSIS CATHETER     RENAL BIOPSY     SHOULDER SURGERY     VENTRICULAR ATRIAL SHUNT Right 11/27/2018   Codman programmable shunt set at 150   Patient Active Problem List   Diagnosis Date Noted   ESRD on dialysis (HCC) 09/15/2021   Idiopathic intracranial hypertension 09/15/2021   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   Anemia of chronic renal failure 09/15/2021   History of DVT (deep vein thrombosis) 09/15/2021   Thrombocytopenia (HCC) 09/15/2021   Hypothyroidism 09/15/2021   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   Complicated migraine 09/14/2021   Cellulitis of chest wall    Acute renal failure (HCC) 01/26/2017   Exacerbation of systemic lupus erythematosus (HCC) 01/26/2017   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   Tachypnea 01/24/2017   Dyspnea 01/24/2017   Macular rash 01/24/2017   Abscess of left axilla 01/24/2017   Abscess of axilla, left 01/23/2017   Hypotension    Sepsis (HCC) 05/15/2016   Elevated troponin 05/15/2016   HTN (hypertension) 05/15/2016   SLE (systemic lupus erythematosus) (HCC) 05/15/2016    PCP: Ninetta Lights, MD  REFERRING PROVIDER: Sula Soda, NP  REFERRING DIAG: s/ p R reverse TSA  THERAPY DIAG:  Stiffness of right shoulder, not elsewhere classified  Muscle weakness (generalized)  Status post reverse total  replacement of right shoulder  Rationale for Evaluation and Treatment: Rehabilitation  ONSET DATE: 06/22/22  SUBJECTIVE:                                                                                                                                                                                      SUBJECTIVE STATEMENT: just still so fatigued and dialysis wore me out yesterday. Doing more at home with arm-parents were suproded PERTINENT HISTORY: Suffered B shoulder fractures, had B ORIF, 1 year ago, did not recover  well, opted for reverse TSA referred to PT by surgeon to initiate rehab  PAIN:  Are you having pain? Yes: NPRS scale: 0 to 4/10 Pain location: R shoulder Pain description: intermittent pain with movement incorrectly Aggravating factors: lifting out Relieving factors: rest, heat  PRECAUTIONS: Shoulder Avoid IR/ext/add, NO UBE, follow protocol in media WEIGHT BEARING RESTRICTIONS: No  FALLS:  Has patient fallen in last 6 months? No  LIVING ENVIRONMENT: Lives with: lives with their family Lives in: House/apartment Stairs: Yes: Internal: 11 steps; on left going up Has following equipment at home: None  OCCUPATION: na  PLOF: Needs assistance with ADLs and Needs assistance with homemaking  PATIENT GOALS:restore movement R arm to perform basic ADL's  NEXT MD VISIT: unclear  OBJECTIVE:   DIAGNOSTIC FINDINGS:  na  PATIENT SURVEYS:  Quick Dash 68.2   COGNITION: Overall cognitive status: Within functional limits for tasks assessed     SENSATION: WFL  POSTURE: Rounded shoulders, protracted shoulders  UPPER EXTREMITY ROM:   Passive ROM Right eval Left eval RT supine  Act/Pass 08/18/22  Shoulder flexion 88 40 0/WFLs  Shoulder extension     Shoulder abduction  30 76/WFLs  Shoulder adduction     Shoulder internal rotation   73/85  Shoulder external rotation 20 8 60/85  Elbow flexion     Elbow extension     Wrist flexion     Wrist extension      Wrist ulnar deviation     Wrist radial deviation     Wrist pronation     Wrist supination     (Blank rows = not tested)  UPPER EXTREMITY MMT:  MMT Right eval Left eval  Shoulder flexion 88 2-  Shoulder extension  2-  Shoulder abduction  2-  Shoulder adduction    Shoulder internal rotation    Shoulder external rotation  2-  Middle trapezius    Lower trapezius    Elbow flexion 3 3  Elbow extension 3 3  Wrist flexion    Wrist extension    Wrist ulnar deviation    Wrist radial deviation    Wrist pronation    Wrist supination    Grip strength (lbs)    (Blank rows = not tested)  JOINT MOBILITY TESTING:  na  PALPATION:  Tender along medial upper arm   TODAY'S TREATMENT:                                                                                                                                         DATE:   08/23/22 Nustep L 5 PROM all directions AA ROM 2 sets 10 chest press,flexion and abd AROM IR/ER 2 sets 10 3# bicep curl with wAte bar 2 sets 10 3# shld ext and IR wAte bar 12 x UE ranger    08/18/22 Nustep 6 min PROM with ROM taken. PROM WFLS, active improving with no  active flexion AA chest press 2 sets 10 and abd 10  AA ROM all directions Isometric all directions 2# bicep curl with wAte bar 2 sets 10 2# shld ext and IR wAte bar 12 x UBE L 1 2 min fwd,rest, 2 min back    Nustep 6 min 08/16/22 UBE L 1 2 min fwd,rest, 2 min back Finger ladder flex and abd 5 x each 2# cane bicep curl 2 sets 10 2# cane ex standing 12 x each shld flex,ER, ext and IR Isometric supine shld ex all directions- very weak PROM RT UE AAROM supine Nustep 5 min UE only  08/11/22 Nustep L 4 5 min for UE ROM Rolling ball on mat multi directional Finger ladder 5 x each way 1# cane ex standing 10 x each shld flex,ER, bicep curl, ext and IR PROM RT shld supine 10 x each way 2 sets Isometric RT shld 10 x each way hold 3 sec- pt is very weak AAROM 10 x each way 2 sets  IR,ER,abd,chest press and flex ( popping with flexion) 2# shruggs,backward rolls and scap squeeze 10 x     Eval initiated stretching R shoulder today in supine, PROM to tolerance, also initiated HEP see below   PATIENT EDUCATION: Education details: POC, goals Person educated: Patient Education method: Explanation, Demonstration, Tactile cues, Verbal cues, and Handouts Education comprehension: verbalized understanding, returned demonstration, verbal cues required, and tactile cues required  HOME EXERCISE PROGRAM: Access Code: [email or web address] URL: [email or web address] Date: 08/03/2022 Prepared by: Caralee Ates  Exercises - Standing Isometric Shoulder Extension with Doorway - Arm Bent  - 1 x daily - 7 x weekly - 3 sets - 10 reps - Standing Isometric Shoulder Abduction with Doorway - Arm Bent  - 1 x daily - 7 x weekly - 3 sets - 10 reps - Standing Isometric Shoulder Flexion with Doorway - Arm Bent  - 1 x daily - 7 x weekly - 3 sets - 10 reps - Seated Shoulder Flexion Slide at Table Top with Forearm in Neutral  - 1 x daily - 7 x weekly - 3 sets - 10 reps - Circular Shoulder Pendulum with Table Support  - 1 x daily - 7 x weekly - 3 sets - 10 reps  ASSESSMENT:  CLINICAL IMPRESSION: progressing ROM and strengthening within protocol. ROM is good but weakness is a major limiting factor in . Audible crepitus with ROM OBJECTIVE IMPAIRMENTS: decreased coordination, decreased ROM, decreased strength, impaired flexibility, impaired UE functional use, and pain.   ACTIVITY LIMITATIONS: carrying, lifting, sleeping, bed mobility, bathing, dressing, reach over head, and hygiene/grooming  PARTICIPATION LIMITATIONS: meal prep, cleaning, laundry, driving, and yard work  PERSONAL FACTORS: Fitness, Past/current experiences, Time since onset of injury/illness/exacerbation, and 3+ comorbidities: lupus, dialysis due to renal disease, h/o seizure with B shoulder fxs  are also affecting patient's  functional outcome.   REHAB POTENTIAL: Good  CLINICAL DECISION MAKING: Evolving/moderate complexity  EVALUATION COMPLEXITY: Moderate   GOALS: Goals reviewed with patient? Yes  SHORT TERM GOALS: Target date: 2 weeks 712/24  I HEP Baseline:initiated at eval Goal status: 08/18/22 MET  LONG TERM GOALS: Target date: 10/26/22  Quick DASH 40% diability Baseline: 68% Goal status: INITIAL  2.  Improve AROM R shoulder elevation to 130, IR and ER 45 for efficient ADL's Baseline: PROM elevation 88, Er 20 Goal status: INITIAL  3.  Improve R biceps, triceps, shoulder strength to 4/5 all plane except Er 3+/5 Baseline: 1/5 shoulder, 3/5 elbow Goal status: INITIAL  PLAN:  PT FREQUENCY: 2x/week  PT DURATION: 12 weeks  PLANNED INTERVENTIONS: Therapeutic exercises, Therapeutic activity, Neuromuscular re-education, Balance training, Gait training, Patient/Family education, and Joint mobilization  PLAN FOR NEXT SESSION: how were therex, follow protocol to advance, use heat, ice as needed   Tiwan Schnitker,ANGIE, PTA 08/23/2022, 12:33 PM [facility] Out[facility] Rehabilitation at [facility] [address] [facility] [place], [place], [address] Phone: [phone]   Fax:  [phone]  Patient Details  Name: ANNALIZE CLEGHORN MRN: [id number] Date of Birth: 1990-12-17 Referring Provider:  Sula Soda, NP  Encounter Date: 08/23/2022   Suanne Marker, PTA 08/23/2022, 12:33 PM  [place] [facility] at [facility] [address] [place], [place], [address] Phone: [phone]   Fax:  [phone] at [facility] [address] [facility] [address]. [place], [place], [address] Phone: [phone]   Fax:  [phone]  Patient Details  Name: KAMEAH THRESHER MRN: [id number] Date of Birth: 1990-08-21 Referring Provider:  Sula Soda, NP  Encounter Date: 08/23/2022   Suanne Marker, PTA 08/23/2022, 12:33 PM  [facility] at [facility] [address] [facility]. [place], [place], [address] Phone: [phone]   Fax:  [phone] at [facility] [address] [facility] [address] [place], [place], [address] Phone: [phone]   Fax:  [phone]  Patient Details  Name: MONTA ZISMAN MRN: [id number] Date of Birth: 10/20/1990 Referring Provider:  Sula Soda, NP  Encounter Date: 08/23/2022   Suanne Marker, PTA 08/23/2022, 12:33 PM  [place] [facility] at [facility] [address] [facility] [place], [place], [address] Phone: [phone]   Fax:  [phone] at [facility] [address] [address] [place], [place], [address] Phone: [phone]   Fax:  [phone]  Patient Details  Name: ARGELIA THUROW MRN: [id number] Date of Birth: Jan 09, 1991 Referring Provider:  Sula Soda, NP  Encounter Date: 08/23/2022   Suanne Marker, PTA 08/23/2022, 12:33 PM  [place] [facility] at [facility] [address] [facility] [place], [place], [address] Phone: [phone]   Fax:  [phone]

## 2022-08-24 NOTE — Therapy (Signed)
OUTPATIENT PHYSICAL THERAPY SHOULDER    Patient Name: Anne Robinson MRN: [id number] DOB:[birth year], 32 y.o., female Today's Date: 08/25/2022  END OF SESSION:  PT End of Session - 08/25/22 1229     Visit Number 6    Date for PT Re-Evaluation 10/26/22    Authorization Type MCR and [employer]    PT Start Time 1230    PT Stop Time 1315    PT Time Calculation (min) 45 min             Past Medical History:  Diagnosis Date   HTN (hypertension) 05/15/2016   Lupus (HCC)    Lupus nephritis (HCC)    Pseudotumor cerebri    Renal disorder    Thyroid disease    Past Surgical History:  Procedure Laterality Date   INSERTION OF DIALYSIS CATHETER     RENAL BIOPSY     SHOULDER SURGERY     VENTRICULAR ATRIAL SHUNT Right 11/27/2018   Codman programmable shunt set at 150   Patient Active Problem List   Diagnosis Date Noted   ESRD on dialysis (HCC) 09/15/2021   Idiopathic intracranial hypertension 09/15/2021   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   Anemia of chronic renal failure 09/15/2021   History of DVT (deep vein thrombosis) 09/15/2021   Thrombocytopenia (HCC) 09/15/2021   Hypothyroidism 09/15/2021   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   Complicated migraine 09/14/2021   Cellulitis of chest wall    Acute renal failure (HCC) 01/26/2017   Exacerbation of systemic lupus erythematosus (HCC) 01/26/2017   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   Tachypnea 01/24/2017   Dyspnea 01/24/2017   Macular rash 01/24/2017   Abscess of left axilla 01/24/2017   Abscess of axilla, left 01/23/2017   Hypotension    Sepsis (HCC) 05/15/2016   Elevated troponin 05/15/2016   HTN (hypertension) 05/15/2016   SLE (systemic lupus erythematosus) (HCC) 05/15/2016    PCP: Ninetta Lights, MD  REFERRING PROVIDER: Sula Soda, NP  REFERRING DIAG: s/ p R reverse TSA  THERAPY DIAG:  Stiffness of right shoulder, not elsewhere classified  Muscle weakness (generalized)  Status post reverse total  replacement of right shoulder  Stiffness of left shoulder, not elsewhere classified  Other symptoms and signs involving the musculoskeletal system  Rationale for Evaluation and Treatment: Rehabilitation  ONSET DATE: 06/22/22  SUBJECTIVE:                                                                                                                                                                                      SUBJECTIVE STATEMENT: I have esophagitis and am fatigued, it is doing weird things to my stomach  PERTINENT HISTORY: Suffered B shoulder fractures, had B ORIF, 1 year ago, did not recover well, opted for reverse TSA referred to PT by surgeon to initiate rehab  PAIN:  Are you having pain? Yes: NPRS scale: 0 to 4/10 Pain location: R shoulder Pain description: intermittent pain with movement incorrectly Aggravating factors: lifting out Relieving factors: rest, heat  PRECAUTIONS: Shoulder Avoid IR/ext/add, NO UBE, follow protocol in media WEIGHT BEARING RESTRICTIONS: No  FALLS:  Has patient fallen in last 6 months? No  LIVING ENVIRONMENT: Lives with: lives with their family Lives in: House/apartment Stairs: Yes: Internal: 11 steps; on left going up Has following equipment at home: None  OCCUPATION: na  PLOF: Needs assistance with ADLs and Needs assistance with homemaking  PATIENT GOALS:restore movement R arm to perform basic ADL's  NEXT MD VISIT: unclear  OBJECTIVE:   DIAGNOSTIC FINDINGS:  na  PATIENT SURVEYS:  Quick Dash 68.2   COGNITION: Overall cognitive status: Within functional limits for tasks assessed     SENSATION: WFL  POSTURE: Rounded shoulders, protracted shoulders  UPPER EXTREMITY ROM:   Passive ROM Right eval Left eval RT supine  Act/Pass 08/18/22  Shoulder flexion 88 40 0/WFLs  Shoulder extension     Shoulder abduction  30 76/WFLs  Shoulder adduction     Shoulder internal rotation   73/85  Shoulder external rotation 20 8  60/85  Elbow flexion     Elbow extension     Wrist flexion     Wrist extension     Wrist ulnar deviation     Wrist radial deviation     Wrist pronation     Wrist supination     (Blank rows = not tested)  UPPER EXTREMITY MMT:  MMT Right eval Left eval  Shoulder flexion 88 2-  Shoulder extension  2-  Shoulder abduction  2-  Shoulder adduction    Shoulder internal rotation    Shoulder external rotation  2-  Middle trapezius    Lower trapezius    Elbow flexion 3 3  Elbow extension 3 3  Wrist flexion    Wrist extension    Wrist ulnar deviation    Wrist radial deviation    Wrist pronation    Wrist supination    Grip strength (lbs)    (Blank rows = not tested)  JOINT MOBILITY TESTING:  na  PALPATION:  Tender along medial upper arm   TODAY'S TREATMENT:                                                                                                                                         DATE:  08/25/22 NuStep L5 x61mins  Standing AAROM shoulder flexion, abd, extension x10  Bicep curls 2# 2x10 Supine PROM in all directions Chest press with dowel 2x5 with minA  1# ER/IR 2x10  Pulleys x10 flexion and abd  RedTB rows 2x10 yellowTB ext  2x10  Isometrics flexion and abd 2x10    08/23/22 Nustep L 5 PROM all directions AA ROM 2 sets 10 chest press,flexion and abd AROM IR/ER 2 sets 10 3# bicep curl with wAte bar 2 sets 10 3# shld ext and IR wAte bar 12 x UE ranger   08/18/22 Nustep 6 min PROM with ROM taken. PROM WFLS, active improving with no active flexion AA chest press 2 sets 10 and abd 10  AA ROM all directions Isometric all directions 2# bicep curl with wAte bar 2 sets 10 2# shld ext and IR wAte bar 12 x UBE L 1 2 min fwd,rest, 2 min back    Nustep 6 min 08/16/22 UBE L 1 2 min fwd,rest, 2 min back Finger ladder flex and abd 5 x each 2# cane bicep curl 2 sets 10 2# cane ex standing 12 x each shld flex,ER, ext and IR Isometric supine shld ex all  directions- very weak PROM RT UE AAROM supine Nustep 5 min UE only  08/11/22 Nustep L 4 5 min for UE ROM Rolling ball on mat multi directional Finger ladder 5 x each way 1# cane ex standing 10 x each shld flex,ER, bicep curl, ext and IR PROM RT shld supine 10 x each way 2 sets Isometric RT shld 10 x each way hold 3 sec- pt is very weak AAROM 10 x each way 2 sets IR,ER,abd,chest press and flex ( popping with flexion) 2# shruggs,backward rolls and scap squeeze 10 x     Eval initiated stretching R shoulder today in supine, PROM to tolerance, also initiated HEP see below   PATIENT EDUCATION: Education details: POC, goals Person educated: Patient Education method: Explanation, Demonstration, Tactile cues, Verbal cues, and Handouts Education comprehension: verbalized understanding, returned demonstration, verbal cues required, and tactile cues required  HOME EXERCISE PROGRAM: Access Code: [email or web address] URL: [email or web address] Date: 08/03/2022 Prepared by: Caralee Ates  Exercises - Standing Isometric Shoulder Extension with Doorway - Arm Bent  - 1 x daily - 7 x weekly - 3 sets - 10 reps - Standing Isometric Shoulder Abduction with Doorway - Arm Bent  - 1 x daily - 7 x weekly - 3 sets - 10 reps - Standing Isometric Shoulder Flexion with Doorway - Arm Bent  - 1 x daily - 7 x weekly - 3 sets - 10 reps - Seated Shoulder Flexion Slide at Table Top with Forearm in Neutral  - 1 x daily - 7 x weekly - 3 sets - 10 reps - Circular Shoulder Pendulum with Table Support  - 1 x daily - 7 x weekly - 3 sets - 10 reps  ASSESSMENT:  CLINICAL IMPRESSION: progressing ROM and strengthening within protocol. ROM is good but weakness is a major limiting factor with all exercises. Really hard doing chest press in supine with dowel.  Some crepitus heard with ROM  OBJECTIVE IMPAIRMENTS: decreased coordination, decreased ROM, decreased strength, impaired flexibility, impaired UE functional use, and  pain.   ACTIVITY LIMITATIONS: carrying, lifting, sleeping, bed mobility, bathing, dressing, reach over head, and hygiene/grooming  PARTICIPATION LIMITATIONS: meal prep, cleaning, laundry, driving, and yard work  PERSONAL FACTORS: Fitness, Past/current experiences, Time since onset of injury/illness/exacerbation, and 3+ comorbidities: lupus, dialysis due to renal disease, h/o seizure with B shoulder fxs  are also affecting patient's functional outcome.   REHAB POTENTIAL: Good  CLINICAL DECISION MAKING: Evolving/moderate complexity  EVALUATION COMPLEXITY: Moderate   GOALS: Goals reviewed with patient? Yes  SHORT TERM GOALS: Target date:  2 weeks 712/24  I HEP Baseline:initiated at eval Goal status: 08/18/22 MET  LONG TERM GOALS: Target date: 10/26/22  Quick DASH 40% diability Baseline: 68% Goal status: INITIAL  2.  Improve AROM R shoulder elevation to 130, IR and ER 45 for efficient ADL's Baseline: PROM elevation 88, Er 20 Goal status: INITIAL  3.  Improve R biceps, triceps, shoulder strength to 4/5 all plane except Er 3+/5 Baseline: 1/5 shoulder, 3/5 elbow Goal status: INITIAL  PLAN:  PT FREQUENCY: 2x/week  PT DURATION: 12 weeks  PLANNED INTERVENTIONS: Therapeutic exercises, Therapeutic activity, Neuromuscular re-education, Balance training, Gait training, Patient/Family education, and Joint mobilization  PLAN FOR NEXT SESSION: how were therex, follow protocol to advance, use heat, ice as needed   Cassie Freer, PT 08/25/2022, 1:12 PM [facility] at [facility] [address] [facility]. [place], [place], [address] Phone: [phone]   Fax:  [phone]  Patient Details  Name: SHAWNTA MORFORD MRN: [id number] Date of Birth: 22-Jun-1990 Referring Provider:  Sula Soda, NP  Encounter Date: 08/25/2022   Cassie Freer, PT 08/25/2022, 1:12 PM  [facility] Out[facility] Rehabilitation at [place] [address] [facility]. [place], [place], [address] Phone: [phone]   Fax:  [phone] at [place] [address] [place], [place], [address] Phone: [phone]   Fax:  [phone]  Patient Details  Name: KARRINA YENSER MRN: [id number] Date of Birth: Dec 26, 1990 Referring Provider:  Sula Soda, NP  Encounter Date: 08/25/2022   Cassie Freer, PT 08/25/2022, 1:12 PM  [facility] Outpatient Rehabilitation at [facility] [address] [place], [place], [address] Phone: [phone]   Fax:  [phone] at [facility] [address] [place], [place], [address] Phone: [phone]   Fax:  [phone]  Patient Details  Name: EMMAMAE VANHALL MRN: [id number] Date of Birth: May 28, 1990 Referring Provider:  Sula Soda, NP  Encounter Date: 08/25/2022   Cassie Freer, PT 08/25/2022, 1:12 PM  [facility] Outpatient Rehabilitation at [facility] [address] [facility]. [place], [place], [address] Phone: [phone]   Fax:  [phone] at [facility] [address] [place], [place], [address] Phone: [phone]   Fax:  [phone]  Patient Details  Name: KURSTYN NORTHRIP MRN: [id number] Date of Birth: [birth year] Referring Provider:  Sula Soda, NP  Encounter Date: 08/25/2022   Cassie Freer, PT 08/25/2022, 1:12 PM  [facility] Outpatient Rehabilitation at [facility] [address] [facility] [place], [place], [address] Phone: [phone]   Fax:  [phone]

## 2022-08-25 ENCOUNTER — Ambulatory Visit: Payer: Medicare HMO

## 2022-08-25 DIAGNOSIS — M25612 Stiffness of left shoulder, not elsewhere classified: Secondary | ICD-10-CM

## 2022-08-25 DIAGNOSIS — M25611 Stiffness of right shoulder, not elsewhere classified: Secondary | ICD-10-CM | POA: Diagnosis not present

## 2022-08-25 DIAGNOSIS — R29898 Other symptoms and signs involving the musculoskeletal system: Secondary | ICD-10-CM

## 2022-08-25 DIAGNOSIS — M6281 Muscle weakness (generalized): Secondary | ICD-10-CM

## 2022-08-25 DIAGNOSIS — Z96611 Presence of right artificial shoulder joint: Secondary | ICD-10-CM

## 2022-08-30 ENCOUNTER — Ambulatory Visit: Payer: Medicare HMO | Admitting: Physical Therapy

## 2022-09-01 ENCOUNTER — Ambulatory Visit: Payer: Medicare HMO

## 2022-10-04 ENCOUNTER — Ambulatory Visit: Payer: Medicare HMO | Admitting: Physical Therapy

## 2022-10-06 ENCOUNTER — Ambulatory Visit: Payer: Medicare HMO | Attending: Physician Assistant

## 2022-10-06 DIAGNOSIS — Z96611 Presence of right artificial shoulder joint: Secondary | ICD-10-CM | POA: Insufficient documentation

## 2022-10-06 DIAGNOSIS — M25511 Pain in right shoulder: Secondary | ICD-10-CM | POA: Diagnosis present

## 2022-10-06 DIAGNOSIS — M25611 Stiffness of right shoulder, not elsewhere classified: Secondary | ICD-10-CM | POA: Diagnosis present

## 2022-10-06 DIAGNOSIS — M6281 Muscle weakness (generalized): Secondary | ICD-10-CM | POA: Diagnosis present

## 2022-10-06 NOTE — Therapy (Signed)
OUTPATIENT PHYSICAL THERAPY SHOULDER EVALUATION   Patient Name: Anne Robinson MRN: [id number] DOB:11/23/1990, 32 y.o., female Today's Date: 10/06/2022  END OF SESSION:  PT End of Session - 10/06/22 1500     Visit Number 1    Date for PT Re-Evaluation 12/29/22    Authorization Type [employer]    PT Start Time 1500    PT Stop Time 1545    PT Time Calculation (min) 45 min    Activity Tolerance Patient tolerated treatment well;Patient limited by pain    Behavior During Therapy [facility] for tasks assessed/performed             Past Medical History:  Diagnosis Date   HTN (hypertension) 05/15/2016   Lupus (HCC)    Lupus nephritis (HCC)    Pseudotumor cerebri    Renal disorder    Thyroid disease    Past Surgical History:  Procedure Laterality Date   INSERTION OF DIALYSIS CATHETER     RENAL BIOPSY     SHOULDER SURGERY     VENTRICULAR ATRIAL SHUNT Right 11/27/2018   Codman programmable shunt set at 150   Patient Active Problem List   Diagnosis Date Noted   ESRD on dialysis (HCC) 09/15/2021   Idiopathic intracranial hypertension 09/15/2021   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   Anemia of chronic renal failure 09/15/2021   History of DVT (deep vein thrombosis) 09/15/2021   Thrombocytopenia (HCC) 09/15/2021   Hypothyroidism 09/15/2021   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   Complicated migraine 09/14/2021   Cellulitis of chest wall    Acute renal failure (HCC) 01/26/2017   Exacerbation of systemic lupus erythematosus (HCC) 01/26/2017   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   Tachypnea 01/24/2017   Dyspnea 01/24/2017   Macular rash 01/24/2017   Abscess of left axilla 01/24/2017   Abscess of axilla, left 01/23/2017   Hypotension    Sepsis (HCC) 05/15/2016   Elevated troponin 05/15/2016   HTN (hypertension) 05/15/2016   SLE (systemic lupus erythematosus) (HCC) 05/15/2016    PCP: Ninetta Lights  REFERRING PROVIDER: Dennie Maizes  REFERRING DIAG:  [phone]  (ICD-10-CM) - Presence of right artificial shoulder joint Z48.89 (ICD-10-CM) - Encounter for other specified surgical aftercare T84.028S (ICD-10-CM) - Dislocation of other internal joint prosthesis, sequela Z96.619 (ICD-10-CM) - Presence of unspecified artificial shoulder joint    THERAPY DIAG:  Status post reverse total replacement of right shoulder  Stiffness of right shoulder, not elsewhere classified  Muscle weakness (generalized)  Acute pain of right shoulder  Rationale for Evaluation and Treatment: Rehabilitation  ONSET DATE: 09/19/22  SUBJECTIVE:  SUBJECTIVE STATEMENT: I went to my two month follow up and they told me my R shoulder was dislocated but I did not feel it. So they did a reverse total surgery.   Hand dominance: Right  PERTINENT HISTORY: 09/19/22 S/P reverse total shoulder arthroplasty, right  Aftercare following surgery  Dislocation of prosthetic joint of shoulder, sequela   06/24/22 reverse total shoulder   ESRD, dialysis MWF   PAIN:  Are you having pain? Yes: NPRS scale: 3/10 Pain location: R shoulder Pain description: ache Aggravating factors: laying the wrong way, lifting overhead  Relieving factors: not moving it   PRECAUTIONS: None  RED FLAGS: None   WEIGHT BEARING RESTRICTIONS: No  FALLS:  Has patient fallen in last 6 months? Yes. Number of falls 1  LIVING ENVIRONMENT: Lives with: lives with their family Lives in: House/apartment Stairs: Yes: Internal: 11 steps; on left going up  OCCUPATION: N/A  PLOF: Needs assistance with ADLs  PATIENT GOALS: get my arm back to normal as much as possible   NEXT MD VISIT:   OBJECTIVE:    COGNITION: Overall cognitive status: Within functional limits for tasks assessed     SENSATION: WFL  POSTURE: Rounded  shoulders, protracted shoulders  UPPER EXTREMITY ROM:   Active ROM Right eval Left eval  Shoulder flexion PROM 65 Unable to do AROM 70  Shoulder extension    Shoulder abduction 30 60  Shoulder adduction    Shoulder internal rotation 35 80  Shoulder external rotation Unable to do AROM 30  Elbow flexion [facility] WFL  Elbow extension [facility] WFL   Wrist flexion    Wrist extension    Wrist ulnar deviation    Wrist radial deviation    Wrist pronation    Wrist supination    (Blank rows = not tested)  UPPER EXTREMITY MMT:  MMT Right eval Left eval  Shoulder flexion 2- 3+  Shoulder extension    Shoulder abduction 2- 3+  Shoulder adduction    Shoulder internal rotation 2- 3+  Shoulder external rotation 2- 3+  Middle trapezius    Lower trapezius    Elbow flexion    Elbow extension    Wrist flexion    Wrist extension    Wrist ulnar deviation    Wrist radial deviation    Wrist pronation    Wrist supination    Grip strength (lbs)    (Blank rows = not tested)  JOINT MOBILITY TESTING:  PROM to tolerable ranges, still very tight and guarded   PALPATION:  TTP and still sensitive around incision    TODAY'S TREATMENT:                                                                                                                                         DATE: 10/06/22- EVAL    PATIENT EDUCATION: Education details: POC and HEP  Person educated: Patient Education method:  Explanation Education comprehension: verbalized understanding  HOME EXERCISE PROGRAM: Access Code: [email or web address] URL: [email or web address] Date: 10/06/2022 Prepared by: Cassie Freer  Exercises - Supine Shoulder Flexion Extension AAROM with Dowel  - 1 x daily - 7 x weekly - 2 sets - 10 reps - Supine Shoulder Abduction AAROM with Dowel  - 1 x daily - 7 x weekly - 2 sets - 10 reps - Supine Shoulder External Rotation in 45 Degrees Abduction AAROM with Dowel  - 1 x daily - 7 x weekly - 2 sets - 10 reps -  Seated Shoulder Flexion Towel Slide at Table Top  - 1 x daily - 7 x weekly - 2 sets - 10 reps - Seated Shoulder Abduction Towel Slide at Table Top  - 1 x daily - 7 x weekly - 2 sets - 10 reps - Seated Scapular Retraction  - 1 x daily - 7 x weekly - 2 sets - 10 reps - 3 hold  ASSESSMENT:  CLINICAL IMPRESSION: Patient is a 32 y.o. female who was seen today for physical therapy evaluation and treatment for s/p R total reverse shoulder surgery. She has the same surgery on 4/19 but when she went to her two month follow up they said it was dislocated. She then underwent another total shoulder on 7/19. Today she presents with very limited ROM and strength in R shoulder. She has a history of issues on her L shoulder as well as was told she needs to get surgery on this side too. This limits her ability to do some AAROM.   OBJECTIVE IMPAIRMENTS: decreased ROM, decreased strength, impaired UE functional use, improper body mechanics, and pain.   ACTIVITY LIMITATIONS: carrying, lifting, dressing, reach over head, and hygiene/grooming  PARTICIPATION LIMITATIONS: cleaning, laundry, and driving  REHAB POTENTIAL: Good  CLINICAL DECISION MAKING: Evolving/moderate complexity  EVALUATION COMPLEXITY: Low   GOALS: Goals reviewed with patient? Yes  SHORT TERM GOALS: Target date: 11/17/22  Patient will be independent with initial HEP.  Goal status: INITIAL   LONG TERM GOALS: Target date: 12/29/22  Patient will be independent with advanced/ongoing HEP to improve outcomes and carryover.  Goal status: INITIAL  2.  Patient will report 75% improvement in R shoulder pain to improve QOL.  Baseline: 3/10 Goal status: INITIAL  3.  Patient to improve R shoulder AROM to [facility] without pain provocation to allow for increased ease of ADLs.  Baseline: see chart Goal status: INITIAL  4.  Patient will demonstrate improved functional UE strength as demonstrated by 4/5 or better. Baseline: 2- Goal status:  INITIAL  PLAN:  PT FREQUENCY: 2x/week  PT DURATION: 12 weeks  PLANNED INTERVENTIONS: Therapeutic exercises, Therapeutic activity, Neuromuscular re-education, Balance training, Gait training, Patient/Family education, Self Care, Joint mobilization, Dry Needling, Electrical stimulation, Cryotherapy, Moist heat, Vasopneumatic device, Ultrasound, Ionotophoresis 4mg /ml Dexamethasone, and Manual therapy  PLAN FOR NEXT SESSION: AAROM, manual therapy, light strengthening as tolerated    Cassie Freer, PT 10/06/2022, 3:40 PM

## 2022-10-11 ENCOUNTER — Ambulatory Visit: Payer: Medicare HMO

## 2022-10-11 DIAGNOSIS — Z96611 Presence of right artificial shoulder joint: Secondary | ICD-10-CM | POA: Diagnosis not present

## 2022-10-11 DIAGNOSIS — M25511 Pain in right shoulder: Secondary | ICD-10-CM

## 2022-10-11 DIAGNOSIS — M25612 Stiffness of left shoulder, not elsewhere classified: Secondary | ICD-10-CM

## 2022-10-11 DIAGNOSIS — M6281 Muscle weakness (generalized): Secondary | ICD-10-CM

## 2022-10-11 DIAGNOSIS — M25611 Stiffness of right shoulder, not elsewhere classified: Secondary | ICD-10-CM

## 2022-10-11 NOTE — Therapy (Signed)
OUTPATIENT PHYSICAL THERAPY SHOULDER TREATMENT   Patient Name: Anne Robinson MRN: [id number] DOB:01/15/1991, 32 y.o., female Today's Date: 10/11/2022  END OF SESSION:  PT End of Session - 10/11/22 1458     Visit Number 2    Date for PT Re-Evaluation 12/29/22    Authorization Type [employer]    PT Start Time 1458    PT Stop Time 1545    PT Time Calculation (min) 47 min    Activity Tolerance Patient tolerated treatment well;Patient limited by pain    Behavior During Therapy [facility] for tasks assessed/performed              Past Medical History:  Diagnosis Date   HTN (hypertension) 05/15/2016   Lupus (HCC)    Lupus nephritis (HCC)    Pseudotumor cerebri    Renal disorder    Thyroid disease    Past Surgical History:  Procedure Laterality Date   INSERTION OF DIALYSIS CATHETER     RENAL BIOPSY     SHOULDER SURGERY     VENTRICULAR ATRIAL SHUNT Right 11/27/2018   Codman programmable shunt set at 150   Patient Active Problem List   Diagnosis Date Noted   ESRD on dialysis (HCC) 09/15/2021   Idiopathic intracranial hypertension 09/15/2021   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   Anemia of chronic renal failure 09/15/2021   History of DVT (deep vein thrombosis) 09/15/2021   Thrombocytopenia (HCC) 09/15/2021   Hypothyroidism 09/15/2021   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   Complicated migraine 09/14/2021   Cellulitis of chest wall    Acute renal failure (HCC) 01/26/2017   Exacerbation of systemic lupus erythematosus (HCC) 01/26/2017   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   Tachypnea 01/24/2017   Dyspnea 01/24/2017   Macular rash 01/24/2017   Abscess of left axilla 01/24/2017   Abscess of axilla, left 01/23/2017   Hypotension    Sepsis (HCC) 05/15/2016   Elevated troponin 05/15/2016   HTN (hypertension) 05/15/2016   SLE (systemic lupus erythematosus) (HCC) 05/15/2016    PCP: Ninetta Lights  REFERRING PROVIDER: Dennie Maizes  REFERRING DIAG:  [phone]  (ICD-10-CM) - Presence of right artificial shoulder joint Z48.89 (ICD-10-CM) - Encounter for other specified surgical aftercare T84.028S (ICD-10-CM) - Dislocation of other internal joint prosthesis, sequela Z96.619 (ICD-10-CM) - Presence of unspecified artificial shoulder joint    THERAPY DIAG:  Status post reverse total replacement of right shoulder  Stiffness of right shoulder, not elsewhere classified  Muscle weakness (generalized)  Acute pain of right shoulder  Stiffness of left shoulder, not elsewhere classified  Rationale for Evaluation and Treatment: Rehabilitation  ONSET DATE: 09/19/22  SUBJECTIVE:  SUBJECTIVE STATEMENT: The shoulder is okay. Not really having pain, it stays at a solid 2. If I move it or lay on it the pain will go up but most of the time it stays the same. Have not started the exercises.   Hand dominance: Right  PERTINENT HISTORY: 09/19/22 S/P reverse total shoulder arthroplasty, right  Aftercare following surgery  Dislocation of prosthetic joint of shoulder, sequela   06/24/22 reverse total shoulder   ESRD, dialysis MWF   PAIN:  Are you having pain? Yes: NPRS scale: 3/10 Pain location: R shoulder Pain description: ache Aggravating factors: laying the wrong way, lifting overhead  Relieving factors: not moving it   PRECAUTIONS: None  RED FLAGS: None   WEIGHT BEARING RESTRICTIONS: No  FALLS:  Has patient fallen in last 6 months? Yes. Number of falls 1  LIVING ENVIRONMENT: Lives with: lives with their family Lives in: House/apartment Stairs: Yes: Internal: 11 steps; on left going up  OCCUPATION: N/A  PLOF: Needs assistance with ADLs  PATIENT GOALS: get my arm back to normal as much as possible   NEXT MD VISIT:   OBJECTIVE:    COGNITION: Overall  cognitive status: Within functional limits for tasks assessed     SENSATION: WFL  POSTURE: Rounded shoulders, protracted shoulders  UPPER EXTREMITY ROM:   Active ROM Right eval Left eval  Shoulder flexion PROM 65 Unable to do AROM 70  Shoulder extension    Shoulder abduction 30 60  Shoulder adduction    Shoulder internal rotation 35 80  Shoulder external rotation Unable to do AROM 30  Elbow flexion [facility] WFL  Elbow extension [facility] WFL   Wrist flexion    Wrist extension    Wrist ulnar deviation    Wrist radial deviation    Wrist pronation    Wrist supination    (Blank rows = not tested)  UPPER EXTREMITY MMT:  MMT Right eval Left eval  Shoulder flexion 2- 3+  Shoulder extension    Shoulder abduction 2- 3+  Shoulder adduction    Shoulder internal rotation 2- 3+  Shoulder external rotation 2- 3+  Middle trapezius    Lower trapezius    Elbow flexion    Elbow extension    Wrist flexion    Wrist extension    Wrist ulnar deviation    Wrist radial deviation    Wrist pronation    Wrist supination    Grip strength (lbs)    (Blank rows = not tested)  JOINT MOBILITY TESTING:  PROM to tolerable ranges, still very tight and guarded   PALPATION:  TTP and still sensitive around incision    TODAY'S TREATMENT:                                                                                                                                         DATE:  10/11/22 Table slides flexion and abd  2x10  Rolling ball on mat multi directions  PROM all directions supine 2x10 each way, grade 2-3 inferior and posterior glides Supine chest press 2x10  AAROM abduction, ER 2x10  Shoulder shrugs 2x10 Scapular squeezes 2x10  Isometrics ER/IR, tried in flexion and abd able to do slightly but compensates heavy x10 3s holds    10/06/22- EVAL    PATIENT EDUCATION: Education details: POC and HEP  Person educated: Patient Education method: Explanation Education comprehension: verbalized  understanding  HOME EXERCISE PROGRAM: Access Code: [email or web address] URL: [email or web address] Date: 10/06/2022 Prepared by: Cassie Freer  Exercises - Supine Shoulder Flexion Extension AAROM with Dowel  - 1 x daily - 7 x weekly - 2 sets - 10 reps - Supine Shoulder Abduction AAROM with Dowel  - 1 x daily - 7 x weekly - 2 sets - 10 reps - Supine Shoulder External Rotation in 45 Degrees Abduction AAROM with Dowel  - 1 x daily - 7 x weekly - 2 sets - 10 reps - Seated Shoulder Flexion Towel Slide at Table Top  - 1 x daily - 7 x weekly - 2 sets - 10 reps - Seated Shoulder Abduction Towel Slide at Table Top  - 1 x daily - 7 x weekly - 2 sets - 10 reps - Seated Scapular Retraction  - 1 x daily - 7 x weekly - 2 sets - 10 reps - 3 hold  ASSESSMENT:  CLINICAL IMPRESSION: Patient is a 32 y.o. female who was seen today for physical therapy evaluation and treatment for s/p R total reverse shoulder surgery. Very limited ROM and strength in R shoulder. Unable to do AAROM shoulder flexion in supine. Tried to do as much AAROM work that she can do with good form and without increased pain. Continue to work on her ROM and strength as tolerated.    OBJECTIVE IMPAIRMENTS: decreased ROM, decreased strength, impaired UE functional use, improper body mechanics, and pain.   ACTIVITY LIMITATIONS: carrying, lifting, dressing, reach over head, and hygiene/grooming  PARTICIPATION LIMITATIONS: cleaning, laundry, and driving  REHAB POTENTIAL: Good  CLINICAL DECISION MAKING: Evolving/moderate complexity  EVALUATION COMPLEXITY: Low   GOALS: Goals reviewed with patient? Yes  SHORT TERM GOALS: Target date: 11/17/22  Patient will be independent with initial HEP.  Goal status: INITIAL   LONG TERM GOALS: Target date: 12/29/22  Patient will be independent with advanced/ongoing HEP to improve outcomes and carryover.  Goal status: INITIAL  2.  Patient will report 75% improvement in R shoulder pain to  improve QOL.  Baseline: 3/10 Goal status: INITIAL  3.  Patient to improve R shoulder AROM to [facility] without pain provocation to allow for increased ease of ADLs.  Baseline: see chart Goal status: INITIAL  4.  Patient will demonstrate improved functional UE strength as demonstrated by 4/5 or better. Baseline: 2- Goal status: INITIAL  PLAN:  PT FREQUENCY: 2x/week  PT DURATION: 12 weeks  PLANNED INTERVENTIONS: Therapeutic exercises, Therapeutic activity, Neuromuscular re-education, Balance training, Gait training, Patient/Family education, Self Care, Joint mobilization, Dry Needling, Electrical stimulation, Cryotherapy, Moist heat, Vasopneumatic device, Ultrasound, Ionotophoresis 4mg /ml Dexamethasone, and Manual therapy  PLAN FOR NEXT SESSION: AAROM, manual therapy, light strengthening as tolerated    Cassie Freer, PT 10/11/2022, 3:41 PM

## 2022-10-13 ENCOUNTER — Ambulatory Visit: Payer: Medicare HMO | Admitting: Physical Therapy

## 2022-10-18 ENCOUNTER — Ambulatory Visit: Payer: Medicare HMO | Admitting: Physical Therapy

## 2022-10-18 ENCOUNTER — Encounter: Payer: Self-pay | Admitting: Physical Therapy

## 2022-10-18 DIAGNOSIS — M6281 Muscle weakness (generalized): Secondary | ICD-10-CM

## 2022-10-18 DIAGNOSIS — Z96611 Presence of right artificial shoulder joint: Secondary | ICD-10-CM

## 2022-10-18 DIAGNOSIS — M25611 Stiffness of right shoulder, not elsewhere classified: Secondary | ICD-10-CM

## 2022-10-18 DIAGNOSIS — M25511 Pain in right shoulder: Secondary | ICD-10-CM

## 2022-10-18 NOTE — Therapy (Signed)
OUTPATIENT PHYSICAL THERAPY SHOULDER TREATMENT   Patient Name: Anne Robinson MRN: [id number] DOB:February 07, 1991, 32 y.o., female Today's Date: 10/18/2022  END OF SESSION:  PT End of Session - 10/18/22 1347     Visit Number 3    Date for PT Re-Evaluation 12/29/22    PT Start Time 1345    PT Stop Time 1430    PT Time Calculation (min) 45 min    Activity Tolerance Patient tolerated treatment well    Behavior During Therapy [facility] for tasks assessed/performed              Past Medical History:  Diagnosis Date   HTN (hypertension) 05/15/2016   Lupus (HCC)    Lupus nephritis (HCC)    Pseudotumor cerebri    Renal disorder    Thyroid disease    Past Surgical History:  Procedure Laterality Date   INSERTION OF DIALYSIS CATHETER     RENAL BIOPSY     SHOULDER SURGERY     VENTRICULAR ATRIAL SHUNT Right 11/27/2018   Codman programmable shunt set at 150   Patient Active Problem List   Diagnosis Date Noted   ESRD on dialysis (HCC) 09/15/2021   Idiopathic intracranial hypertension 09/15/2021   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   Anemia of chronic renal failure 09/15/2021   History of DVT (deep vein thrombosis) 09/15/2021   Thrombocytopenia (HCC) 09/15/2021   Hypothyroidism 09/15/2021   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   Complicated migraine 09/14/2021   Cellulitis of chest wall    Acute renal failure (HCC) 01/26/2017   Exacerbation of systemic lupus erythematosus (HCC) 01/26/2017   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   Tachypnea 01/24/2017   Dyspnea 01/24/2017   Macular rash 01/24/2017   Abscess of left axilla 01/24/2017   Abscess of axilla, left 01/23/2017   Hypotension    Sepsis (HCC) 05/15/2016   Elevated troponin 05/15/2016   HTN (hypertension) 05/15/2016   SLE (systemic lupus erythematosus) (HCC) 05/15/2016    PCP: Ninetta Lights  REFERRING PROVIDER: Dennie Maizes  REFERRING DIAG:  [phone] (ICD-10-CM) - Presence of right artificial shoulder  joint Z48.89 (ICD-10-CM) - Encounter for other specified surgical aftercare T84.028S (ICD-10-CM) - Dislocation of other internal joint prosthesis, sequela Z96.619 (ICD-10-CM) - Presence of unspecified artificial shoulder joint    THERAPY DIAG:  Status post reverse total replacement of right shoulder  Stiffness of right shoulder, not elsewhere classified  Acute pain of right shoulder  Muscle weakness (generalized)  Rationale for Evaluation and Treatment: Rehabilitation  ONSET DATE: 09/19/22  SUBJECTIVE:  SUBJECTIVE STATEMENT: "Its going pretty good" Went to the doctor yesterday X-rays looked good  Hand dominance: Right  PERTINENT HISTORY: 09/19/22 S/P reverse total shoulder arthroplasty, right  Aftercare following surgery  Dislocation of prosthetic joint of shoulder, sequela   06/24/22 reverse total shoulder   ESRD, dialysis MWF   PAIN:  Are you having pain? Yes: NPRS scale: 2/10 Pain location: R shoulder Pain description: ache Aggravating factors: laying the wrong way, lifting overhead  Relieving factors: not moving it   PRECAUTIONS: None  RED FLAGS: None   WEIGHT BEARING RESTRICTIONS: No  FALLS:  Has patient fallen in last 6 months? Yes. Number of falls 1  LIVING ENVIRONMENT: Lives with: lives with their family Lives in: House/apartment Stairs: Yes: Internal: 11 steps; on left going up  OCCUPATION: N/A  PLOF: Needs assistance with ADLs  PATIENT GOALS: get my arm back to normal as much as possible   NEXT MD VISIT:   OBJECTIVE:    COGNITION: Overall cognitive status: Within functional limits for tasks assessed     SENSATION: WFL  POSTURE: Rounded shoulders, protracted shoulders  UPPER EXTREMITY ROM:   Active ROM Right eval Left eval  Shoulder flexion  PROM 65 Unable to do AROM 70  Shoulder extension    Shoulder abduction 30 60  Shoulder adduction    Shoulder internal rotation 35 80  Shoulder external rotation Unable to do AROM 30  Elbow flexion [facility] WFL  Elbow extension [facility] WFL   Wrist flexion    Wrist extension    Wrist ulnar deviation    Wrist radial deviation    Wrist pronation    Wrist supination    (Blank rows = not tested)  UPPER EXTREMITY MMT:  MMT Right eval Left eval  Shoulder flexion 2- 3+  Shoulder extension    Shoulder abduction 2- 3+  Shoulder adduction    Shoulder internal rotation 2- 3+  Shoulder external rotation 2- 3+  Middle trapezius    Lower trapezius    Elbow flexion    Elbow extension    Wrist flexion    Wrist extension    Wrist ulnar deviation    Wrist radial deviation    Wrist pronation    Wrist supination    Grip strength (lbs)    (Blank rows = not tested)  JOINT MOBILITY TESTING:  PROM to tolerable ranges, still very tight and guarded   PALPATION:  TTP and still sensitive around incision    TODAY'S TREATMENT:                                                                                                                                         DATE:  10/18/22 Ball on table PROM all directions supine each way, grade 2-3 inferior and posterior glides RUE isometrics flex, ext, IR, ER, abd 2 sets of 3 w/ 5'' holds  Scapular squeezes 2x10  Shoulder  shrugs 2x10  10/11/22 Table slides flexion and abd 2x10  Rolling ball on mat multi directions  PROM all directions supine 2x10 each way, grade 2-3 inferior and posterior glides Supine chest press 2x10  AAROM abduction, ER 2x10  Shoulder shrugs 2x10 Scapular squeezes 2x10  Isometrics ER/IR, tried in flexion and abd able to do slightly but compensates heavy x10 3s holds    10/06/22- EVAL    PATIENT EDUCATION: Education details: POC and HEP  Person educated: Patient Education method: Explanation Education comprehension: verbalized  understanding  HOME EXERCISE PROGRAM: Access Code: [email or web address] URL: [email or web address] Date: 10/06/2022 Prepared by: Cassie Freer  Exercises - Supine Shoulder Flexion Extension AAROM with Dowel  - 1 x daily - 7 x weekly - 2 sets - 10 reps - Supine Shoulder Abduction AAROM with Dowel  - 1 x daily - 7 x weekly - 2 sets - 10 reps - Supine Shoulder External Rotation in 45 Degrees Abduction AAROM with Dowel  - 1 x daily - 7 x weekly - 2 sets - 10 reps - Seated Shoulder Flexion Towel Slide at Table Top  - 1 x daily - 7 x weekly - 2 sets - 10 reps - Seated Shoulder Abduction Towel Slide at Table Top  - 1 x daily - 7 x weekly - 2 sets - 10 reps - Seated Scapular Retraction  - 1 x daily - 7 x weekly - 2 sets - 10 reps - 3 hold  ASSESSMENT:  CLINICAL IMPRESSION: Patient is a 32 y.o. female who was seen today for physical therapy  treatment for s/p R total reverse shoulder surgery. Pt has good PROM considering post op date. She is very weak with isometrics. Cue to hold contraction with scapular retractions. Continue to work on her ROM and strength as tolerated.    OBJECTIVE IMPAIRMENTS: decreased ROM, decreased strength, impaired UE functional use, improper body mechanics, and pain.   ACTIVITY LIMITATIONS: carrying, lifting, dressing, reach over head, and hygiene/grooming  PARTICIPATION LIMITATIONS: cleaning, laundry, and driving  REHAB POTENTIAL: Good  CLINICAL DECISION MAKING: Evolving/moderate complexity  EVALUATION COMPLEXITY: Low   GOALS: Goals reviewed with patient? Yes  SHORT TERM GOALS: Target date: 11/17/22  Patient will be independent with initial HEP.  Goal status: INITIAL   LONG TERM GOALS: Target date: 12/29/22  Patient will be independent with advanced/ongoing HEP to improve outcomes and carryover.  Goal status: INITIAL  2.  Patient will report 75% improvement in R shoulder pain to improve QOL.  Baseline: 3/10 Goal status: INITIAL  3.  Patient to  improve R shoulder AROM to [facility] without pain provocation to allow for increased ease of ADLs.  Baseline: see chart Goal status: INITIAL  4.  Patient will demonstrate improved functional UE strength as demonstrated by 4/5 or better. Baseline: 2- Goal status: INITIAL  PLAN:  PT FREQUENCY: 2x/week  PT DURATION: 12 weeks  PLANNED INTERVENTIONS: Therapeutic exercises, Therapeutic activity, Neuromuscular re-education, Balance training, Gait training, Patient/Family education, Self Care, Joint mobilization, Dry Needling, Electrical stimulation, Cryotherapy, Moist heat, Vasopneumatic device, Ultrasound, Ionotophoresis 4mg /ml Dexamethasone, and Manual therapy  PLAN FOR NEXT SESSION: AAROM, manual therapy, light strengthening as tolerated    Grayce Sessions, PTA 10/18/2022, 1:47 PM

## 2022-10-20 ENCOUNTER — Ambulatory Visit: Payer: Medicare HMO | Admitting: Physical Therapy

## 2022-10-20 ENCOUNTER — Encounter: Payer: Self-pay | Admitting: Physical Therapy

## 2022-10-20 DIAGNOSIS — M25511 Pain in right shoulder: Secondary | ICD-10-CM

## 2022-10-20 DIAGNOSIS — Z96611 Presence of right artificial shoulder joint: Secondary | ICD-10-CM | POA: Diagnosis not present

## 2022-10-20 DIAGNOSIS — M6281 Muscle weakness (generalized): Secondary | ICD-10-CM

## 2022-10-20 DIAGNOSIS — M25611 Stiffness of right shoulder, not elsewhere classified: Secondary | ICD-10-CM

## 2022-10-20 NOTE — Therapy (Signed)
OUTPATIENT PHYSICAL THERAPY SHOULDER TREATMENT   Patient Name: Anne Robinson MRN: [id number] DOB:[birth year], 32 y.o., female Today's Date: 10/20/2022  END OF SESSION:  PT End of Session - 10/20/22 1435     Visit Number 4    Date for PT Re-Evaluation 12/29/22    PT Start Time 1435    PT Stop Time 1515    PT Time Calculation (min) 40 min    Activity Tolerance Patient tolerated treatment well    Behavior During Therapy [facility] for tasks assessed/performed              Past Medical History:  Diagnosis Date   HTN (hypertension) 05/15/2016   Lupus (HCC)    Lupus nephritis (HCC)    Pseudotumor cerebri    Renal disorder    Thyroid disease    Past Surgical History:  Procedure Laterality Date   INSERTION OF DIALYSIS CATHETER     RENAL BIOPSY     SHOULDER SURGERY     VENTRICULAR ATRIAL SHUNT Right 11/27/2018   Codman programmable shunt set at 150   Patient Active Problem List   Diagnosis Date Noted   ESRD on dialysis (HCC) 09/15/2021   Idiopathic intracranial hypertension 09/15/2021   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   Anemia of chronic renal failure 09/15/2021   History of DVT (deep vein thrombosis) 09/15/2021   Thrombocytopenia (HCC) 09/15/2021   Hypothyroidism 09/15/2021   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   Complicated migraine 09/14/2021   Cellulitis of chest wall    Acute renal failure (HCC) 01/26/2017   Exacerbation of systemic lupus erythematosus (HCC) 01/26/2017   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   Tachypnea 01/24/2017   Dyspnea 01/24/2017   Macular rash 01/24/2017   Abscess of left axilla 01/24/2017   Abscess of axilla, left 01/23/2017   Hypotension    Sepsis (HCC) 05/15/2016   Elevated troponin 05/15/2016   HTN (hypertension) 05/15/2016   SLE (systemic lupus erythematosus) (HCC) 05/15/2016    PCP: Ninetta Lights  REFERRING PROVIDER: Dennie Maizes  REFERRING DIAG:  [phone] (ICD-10-CM) - Presence of right artificial shoulder  joint Z48.89 (ICD-10-CM) - Encounter for other specified surgical aftercare T84.028S (ICD-10-CM) - Dislocation of other internal joint prosthesis, sequela Z96.619 (ICD-10-CM) - Presence of unspecified artificial shoulder joint    THERAPY DIAG:  Stiffness of right shoulder, not elsewhere classified  Status post reverse total replacement of right shoulder  Muscle weakness (generalized)  Acute pain of right shoulder  Rationale for Evaluation and Treatment: Rehabilitation  ONSET DATE: 09/19/22  SUBJECTIVE:  SUBJECTIVE STATEMENT: ""Just fatigued, but that's just the norm"  Hand dominance: Right  PERTINENT HISTORY: 09/19/22 S/P reverse total shoulder arthroplasty, right  Aftercare following surgery  Dislocation of prosthetic joint of shoulder, sequela   06/24/22 reverse total shoulder   ESRD, dialysis MWF   PAIN:  Are you having pain? Yes: NPRS scale: 2/10 Pain location: R shoulder Pain description: ache Aggravating factors: laying the wrong way, lifting overhead  Relieving factors: not moving it   PRECAUTIONS: None  RED FLAGS: None   WEIGHT BEARING RESTRICTIONS: No  FALLS:  Has patient fallen in last 6 months? Yes. Number of falls 1  LIVING ENVIRONMENT: Lives with: lives with their family Lives in: House/apartment Stairs: Yes: Internal: 11 steps; on left going up  OCCUPATION: N/A  PLOF: Needs assistance with ADLs  PATIENT GOALS: get my arm back to normal as much as possible   NEXT MD VISIT:   OBJECTIVE:    COGNITION: Overall cognitive status: Within functional limits for tasks assessed     SENSATION: WFL  POSTURE: Rounded shoulders, protracted shoulders  UPPER EXTREMITY ROM:   Active ROM Right eval Left eval  Shoulder flexion PROM 65 Unable to do AROM 70   Shoulder extension    Shoulder abduction 30 60  Shoulder adduction    Shoulder internal rotation 35 80  Shoulder external rotation Unable to do AROM 30  Elbow flexion [facility] WFL  Elbow extension [facility] WFL   Wrist flexion    Wrist extension    Wrist ulnar deviation    Wrist radial deviation    Wrist pronation    Wrist supination    (Blank rows = not tested)  UPPER EXTREMITY MMT:  MMT Right eval Left eval  Shoulder flexion 2- 3+  Shoulder extension    Shoulder abduction 2- 3+  Shoulder adduction    Shoulder internal rotation 2- 3+  Shoulder external rotation 2- 3+  Middle trapezius    Lower trapezius    Elbow flexion    Elbow extension    Wrist flexion    Wrist extension    Wrist ulnar deviation    Wrist radial deviation    Wrist pronation    Wrist supination    Grip strength (lbs)    (Blank rows = not tested)  JOINT MOBILITY TESTING:  PROM to tolerable ranges, still very tight and guarded   PALPATION:  TTP and still sensitive around incision    TODAY'S TREATMENT:                                                                                                                                         DATE:  10/20/22 Ball on table Scapular squeezes 2x10  Shoulder shrugs 2x10 Biceps Curls 1lb 2x10 Triceps Ext yellow 2x10 each PROM all directions supine each way, grade 2-3 inferior and posterior glides RUE isometrics flex, ext, IR, ER, abd 2 sets  of 3 w/ 5'' holds   10/18/22 Ball on table PROM all directions supine each way, grade 2-3 inferior and posterior glides RUE isometrics flex, ext, IR, ER, abd 2 sets of 3 w/ 5'' holds  Scapular squeezes 2x10  Shoulder shrugs 2x10  10/11/22 Table slides flexion and abd 2x10  Rolling ball on mat multi directions  PROM all directions supine 2x10 each way, grade 2-3 inferior and posterior glides Supine chest press 2x10  AAROM abduction, ER 2x10  Shoulder shrugs 2x10 Scapular squeezes 2x10  Isometrics ER/IR, tried in flexion  and abd able to do slightly but compensates heavy x10 3s holds    10/06/22- EVAL    PATIENT EDUCATION: Education details: POC and HEP  Person educated: Patient Education method: Explanation Education comprehension: verbalized understanding  HOME EXERCISE PROGRAM: Access Code: [email or web address] URL: [email or web address] Date: 10/06/2022 Prepared by: Cassie Freer  Exercises - Supine Shoulder Flexion Extension AAROM with Dowel  - 1 x daily - 7 x weekly - 2 sets - 10 reps - Supine Shoulder Abduction AAROM with Dowel  - 1 x daily - 7 x weekly - 2 sets - 10 reps - Supine Shoulder External Rotation in 45 Degrees Abduction AAROM with Dowel  - 1 x daily - 7 x weekly - 2 sets - 10 reps - Seated Shoulder Flexion Towel Slide at Table Top  - 1 x daily - 7 x weekly - 2 sets - 10 reps - Seated Shoulder Abduction Towel Slide at Table Top  - 1 x daily - 7 x weekly - 2 sets - 10 reps - Seated Scapular Retraction  - 1 x daily - 7 x weekly - 2 sets - 10 reps - 3 hold  ASSESSMENT:  CLINICAL IMPRESSION: Patient is a 32 y.o. female who was seen today for physical therapy  treatment for s/p R total reverse shoulder surgery. Pt continues to have good PROM considering post op date. She is very weak with isometrics. Cue to hold contraction with scapular retractions. Continue to work on her ROM and strength as tolerated. Follow protocol    OBJECTIVE IMPAIRMENTS: decreased ROM, decreased strength, impaired UE functional use, improper body mechanics, and pain.   ACTIVITY LIMITATIONS: carrying, lifting, dressing, reach over head, and hygiene/grooming  PARTICIPATION LIMITATIONS: cleaning, laundry, and driving  REHAB POTENTIAL: Good  CLINICAL DECISION MAKING: Evolving/moderate complexity  EVALUATION COMPLEXITY: Low   GOALS: Goals reviewed with patient? Yes  SHORT TERM GOALS: Target date: 11/17/22  Patient will be independent with initial HEP.  Goal status: INITIAL   LONG TERM GOALS: Target  date: 12/29/22  Patient will be independent with advanced/ongoing HEP to improve outcomes and carryover.  Goal status: INITIAL  2.  Patient will report 75% improvement in R shoulder pain to improve QOL.  Baseline: 3/10 Goal status: INITIAL  3.  Patient to improve R shoulder AROM to [facility] without pain provocation to allow for increased ease of ADLs.  Baseline: see chart Goal status: INITIAL  4.  Patient will demonstrate improved functional UE strength as demonstrated by 4/5 or better. Baseline: 2- Goal status: INITIAL  PLAN:  PT FREQUENCY: 2x/week  PT DURATION: 12 weeks  PLANNED INTERVENTIONS: Therapeutic exercises, Therapeutic activity, Neuromuscular re-education, Balance training, Gait training, Patient/Family education, Self Care, Joint mobilization, Dry Needling, Electrical stimulation, Cryotherapy, Moist heat, Vasopneumatic device, Ultrasound, Ionotophoresis 4mg /ml Dexamethasone, and Manual therapy  PLAN FOR NEXT SESSION: AAROM, manual therapy, light strengthening as tolerated    Grayce Sessions, PTA 10/20/2022, 2:36 PM

## 2022-10-24 NOTE — Therapy (Signed)
OUTPATIENT PHYSICAL THERAPY SHOULDER TREATMENT   Patient Name: Anne Robinson MRN: [id number] DOB:[birth year], 32 y.o., female Today's Date: 10/25/2022  END OF SESSION:  PT End of Session - 10/25/22 1459     Visit Number 5    Date for PT Re-Evaluation 12/29/22    PT Start Time 1500    PT Stop Time 1545    PT Time Calculation (min) 45 min    Activity Tolerance Patient tolerated treatment well    Behavior During Therapy [facility] for tasks assessed/performed               Past Medical History:  Diagnosis Date   HTN (hypertension) 05/15/2016   Lupus (HCC)    Lupus nephritis (HCC)    Pseudotumor cerebri    Renal disorder    Thyroid disease    Past Surgical History:  Procedure Laterality Date   INSERTION OF DIALYSIS CATHETER     RENAL BIOPSY     SHOULDER SURGERY     VENTRICULAR ATRIAL SHUNT Right 11/27/2018   Codman programmable shunt set at 150   Patient Active Problem List   Diagnosis Date Noted   ESRD on dialysis (HCC) 09/15/2021   Idiopathic intracranial hypertension 09/15/2021   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   Anemia of chronic renal failure 09/15/2021   History of DVT (deep vein thrombosis) 09/15/2021   Thrombocytopenia (HCC) 09/15/2021   Hypothyroidism 09/15/2021   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   Complicated migraine 09/14/2021   Cellulitis of chest wall    Acute renal failure (HCC) 01/26/2017   Exacerbation of systemic lupus erythematosus (HCC) 01/26/2017   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   Tachypnea 01/24/2017   Dyspnea 01/24/2017   Macular rash 01/24/2017   Abscess of left axilla 01/24/2017   Abscess of axilla, left 01/23/2017   Hypotension    Sepsis (HCC) 05/15/2016   Elevated troponin 05/15/2016   HTN (hypertension) 05/15/2016   SLE (systemic lupus erythematosus) (HCC) 05/15/2016    PCP: Ninetta Lights  REFERRING PROVIDER: Dennie Maizes  REFERRING DIAG:  [phone] (ICD-10-CM) - Presence of right artificial shoulder  joint Z48.89 (ICD-10-CM) - Encounter for other specified surgical aftercare T84.028S (ICD-10-CM) - Dislocation of other internal joint prosthesis, sequela Z96.619 (ICD-10-CM) - Presence of unspecified artificial shoulder joint    THERAPY DIAG:  Stiffness of right shoulder, not elsewhere classified  Status post reverse total replacement of right shoulder  Muscle weakness (generalized)  Acute pain of right shoulder  Stiffness of left shoulder, not elsewhere classified  Other symptoms and signs involving the musculoskeletal system  Rationale for Evaluation and Treatment: Rehabilitation  ONSET DATE: 09/19/22  SUBJECTIVE:  SUBJECTIVE STATEMENT: The left one is hurting, the R one is hurting a little too. I think it might be because I slept on the left side.   Hand dominance: Right  PERTINENT HISTORY: 09/19/22 S/P reverse total shoulder arthroplasty, right  Aftercare following surgery  Dislocation of prosthetic joint of shoulder, sequela   06/24/22 reverse total shoulder   ESRD, dialysis MWF   PAIN:  Are you having pain? Yes: NPRS scale: 2/10 Pain location: R shoulder Pain description: ache Aggravating factors: laying the wrong way, lifting overhead  Relieving factors: not moving it   PRECAUTIONS: None  RED FLAGS: None   WEIGHT BEARING RESTRICTIONS: No  FALLS:  Has patient fallen in last 6 months? Yes. Number of falls 1  LIVING ENVIRONMENT: Lives with: lives with their family Lives in: House/apartment Stairs: Yes: Internal: 11 steps; on left going up  OCCUPATION: N/A  PLOF: Needs assistance with ADLs  PATIENT GOALS: get my arm back to normal as much as possible   NEXT MD VISIT:   OBJECTIVE:    COGNITION: Overall cognitive status: Within functional limits for tasks  assessed     SENSATION: WFL  POSTURE: Rounded shoulders, protracted shoulders  UPPER EXTREMITY ROM:   Active ROM Right eval Left eval  Shoulder flexion PROM 65 Unable to do AROM 70  Shoulder extension    Shoulder abduction 30 60  Shoulder adduction    Shoulder internal rotation 35 80  Shoulder external rotation Unable to do AROM 30  Elbow flexion [facility] WFL  Elbow extension [facility] WFL   Wrist flexion    Wrist extension    Wrist ulnar deviation    Wrist radial deviation    Wrist pronation    Wrist supination    (Blank rows = not tested)  UPPER EXTREMITY MMT:  MMT Right eval Left eval  Shoulder flexion 2- 3+  Shoulder extension    Shoulder abduction 2- 3+  Shoulder adduction    Shoulder internal rotation 2- 3+  Shoulder external rotation 2- 3+  Middle trapezius    Lower trapezius    Elbow flexion    Elbow extension    Wrist flexion    Wrist extension    Wrist ulnar deviation    Wrist radial deviation    Wrist pronation    Wrist supination    Grip strength (lbs)    (Blank rows = not tested)  JOINT MOBILITY TESTING:  PROM to tolerable ranges, still very tight and guarded   PALPATION:  TTP and still sensitive around incision    TODAY'S TREATMENT:                                                                                                                                         DATE:  10/25/22 Ball on table multi direction PROM all directions supine each way, grade 2-3 inferior and posterior glides Supine chest press with  dowel x10 Supine AAROM abduction and ER x10  RUE isometrics flex, ext, IR, ER, abd 2 sets of 5 w/ 3s holds against ball on wall  Scapular squeezes 2x10  Shoulder shrugs 2x10 Wall slides x10 Finger ladder x5, able to get to 10th one    10/20/22 Ball on table Scapular squeezes 2x10  Shoulder shrugs 2x10 Biceps Curls 1lb 2x10 Triceps Ext yellow 2x10 each PROM all directions supine each way, grade 2-3 inferior and posterior  glides RUE isometrics flex, ext, IR, ER, abd 2 sets of 3 w/ 5'' holds   10/18/22 Ball on table PROM all directions supine each way, grade 2-3 inferior and posterior glides RUE isometrics flex, ext, IR, ER, abd 2 sets of 3 w/ 5'' holds  Scapular squeezes 2x10  Shoulder shrugs 2x10  10/11/22 Table slides flexion and abd 2x10  Rolling ball on mat multi directions  PROM all directions supine 2x10 each way, grade 2-3 inferior and posterior glides Supine chest press 2x10  AAROM abduction, ER 2x10  Shoulder shrugs 2x10 Scapular squeezes 2x10  Isometrics ER/IR, tried in flexion and abd able to do slightly but compensates heavy x10 3s holds    10/06/22- EVAL    PATIENT EDUCATION: Education details: POC and HEP  Person educated: Patient Education method: Explanation Education comprehension: verbalized understanding  HOME EXERCISE PROGRAM: Access Code: [email or web address] URL: [email or web address] Date: 10/06/2022 Prepared by: Cassie Freer  Exercises - Supine Shoulder Flexion Extension AAROM with Dowel  - 1 x daily - 7 x weekly - 2 sets - 10 reps - Supine Shoulder Abduction AAROM with Dowel  - 1 x daily - 7 x weekly - 2 sets - 10 reps - Supine Shoulder External Rotation in 45 Degrees Abduction AAROM with Dowel  - 1 x daily - 7 x weekly - 2 sets - 10 reps - Seated Shoulder Flexion Towel Slide at Table Top  - 1 x daily - 7 x weekly - 2 sets - 10 reps - Seated Shoulder Abduction Towel Slide at Table Top  - 1 x daily - 7 x weekly - 2 sets - 10 reps - Seated Scapular Retraction  - 1 x daily - 7 x weekly - 2 sets - 10 reps - 3 hold  ASSESSMENT:  CLINICAL IMPRESSION: Patient is a 32 y.o. female who was seen today for physical therapy  treatment for s/p R total reverse shoulder surgery. Pt continues to have good PROM considering post op date. She is very weak with isometrics. She is 7 weeks out so we progressed to do some gentle AAROM. Unable to do shoulder flexion yet due to weakness. Pain  with ER and abduction isometrics and with wall slides.     OBJECTIVE IMPAIRMENTS: decreased ROM, decreased strength, impaired UE functional use, improper body mechanics, and pain.   ACTIVITY LIMITATIONS: carrying, lifting, dressing, reach over head, and hygiene/grooming  PARTICIPATION LIMITATIONS: cleaning, laundry, and driving  REHAB POTENTIAL: Good  CLINICAL DECISION MAKING: Evolving/moderate complexity  EVALUATION COMPLEXITY: Low   GOALS: Goals reviewed with patient? Yes  SHORT TERM GOALS: Target date: 11/17/22  Patient will be independent with initial HEP.  Goal status: INITIAL   LONG TERM GOALS: Target date: 12/29/22  Patient will be independent with advanced/ongoing HEP to improve outcomes and carryover.  Goal status: INITIAL  2.  Patient will report 75% improvement in R shoulder pain to improve QOL.  Baseline: 3/10 Goal status: INITIAL  3.  Patient to improve R shoulder AROM to [facility] without pain provocation  to allow for increased ease of ADLs.  Baseline: see chart Goal status: INITIAL  4.  Patient will demonstrate improved functional UE strength as demonstrated by 4/5 or better. Baseline: 2- Goal status: INITIAL  PLAN:  PT FREQUENCY: 2x/week  PT DURATION: 12 weeks  PLANNED INTERVENTIONS: Therapeutic exercises, Therapeutic activity, Neuromuscular re-education, Balance training, Gait training, Patient/Family education, Self Care, Joint mobilization, Dry Needling, Electrical stimulation, Cryotherapy, Moist heat, Vasopneumatic device, Ultrasound, Ionotophoresis 4mg /ml Dexamethasone, and Manual therapy  PLAN FOR NEXT SESSION: AAROM, manual therapy, light strengthening as tolerated    Cassie Freer, PT 10/25/2022, 3:39 PM

## 2022-10-25 ENCOUNTER — Ambulatory Visit: Payer: Medicare HMO

## 2022-10-25 DIAGNOSIS — Z96611 Presence of right artificial shoulder joint: Secondary | ICD-10-CM | POA: Diagnosis not present

## 2022-10-25 DIAGNOSIS — M25611 Stiffness of right shoulder, not elsewhere classified: Secondary | ICD-10-CM

## 2022-10-25 DIAGNOSIS — M25612 Stiffness of left shoulder, not elsewhere classified: Secondary | ICD-10-CM

## 2022-10-25 DIAGNOSIS — M25511 Pain in right shoulder: Secondary | ICD-10-CM

## 2022-10-25 DIAGNOSIS — M6281 Muscle weakness (generalized): Secondary | ICD-10-CM

## 2022-10-25 DIAGNOSIS — R29898 Other symptoms and signs involving the musculoskeletal system: Secondary | ICD-10-CM

## 2022-10-27 ENCOUNTER — Ambulatory Visit: Payer: Medicare HMO | Admitting: Physical Therapy

## 2022-10-31 NOTE — Therapy (Signed)
OUTPATIENT PHYSICAL THERAPY SHOULDER TREATMENT   Patient Name: Anne Robinson MRN: [id number] DOB:December 31, 1990, 32 y.o., female Today's Date: 11/01/2022  END OF SESSION:  PT End of Session - 11/01/22 1459     Visit Number 6    Date for PT Re-Evaluation 12/29/22    PT Start Time 1500    PT Stop Time 1545    PT Time Calculation (min) 45 min    Activity Tolerance Patient tolerated treatment well    Behavior During Therapy [facility] for tasks assessed/performed                Past Medical History:  Diagnosis Date   HTN (hypertension) 05/15/2016   Lupus (HCC)    Lupus nephritis (HCC)    Pseudotumor cerebri    Renal disorder    Thyroid disease    Past Surgical History:  Procedure Laterality Date   INSERTION OF DIALYSIS CATHETER     RENAL BIOPSY     SHOULDER SURGERY     VENTRICULAR ATRIAL SHUNT Right 11/27/2018   C[employer]man programmable shunt set at 150   Patient Active Problem List   Diagnosis Date Noted   ESRD on dialysis (HCC) 09/15/2021   Idiopathic intracranial hypertension 09/15/2021   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   Anemia of chronic renal failure 09/15/2021   History of DVT (deep vein thrombosis) 09/15/2021   Thrombocytopenia (HCC) 09/15/2021   Hypothyroidism 09/15/2021   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   Complicated migraine 09/14/2021   Cellulitis of chest wall    Acute renal failure (HCC) 01/26/2017   Exacerbation of systemic lupus erythematosus (HCC) 01/26/2017   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   Tachypnea 01/24/2017   Dyspnea 01/24/2017   Macular rash 01/24/2017   Abscess of left axilla 01/24/2017   Abscess of axilla, left 01/23/2017   Hypotension    Sepsis (HCC) 05/15/2016   Elevated troponin 05/15/2016   HTN (hypertension) 05/15/2016   SLE (systemic lupus erythematosus) (HCC) 05/15/2016    PCP: Ninetta Lights  REFERRING PROVIDER: Dennie Maizes  REFERRING DIAG:  [phone] (ICD-10-CM) - Presence of right artificial shoulder  joint Z48.89 (ICD-10-CM) - Encounter for other specified surgical aftercare T84.028S (ICD-10-CM) - Dislocation of other internal joint prosthesis, sequela Z96.619 (ICD-10-CM) - Presence of unspecified artificial shoulder joint    THERAPY DIAG:  Stiffness of right shoulder, not elsewhere classified  Status post reverse total replacement of right shoulder  Muscle weakness (generalized)  Rationale for Evaluation and Treatment: Rehabilitation  ONSET DATE: 09/19/22  SUBJECTIVE:  SUBJECTIVE STATEMENT: Same old, not worse it is still there. The left arm still hurts.   Hand dominance: Right  PERTINENT HISTORY: 09/19/22 S/P reverse total shoulder arthroplasty, right  Aftercare following surgery  Dislocation of prosthetic joint of shoulder, sequela   06/24/22 reverse total shoulder   ESRD, dialysis MWF   PAIN:  Are you having pain? Yes: NPRS scale: 2/10 Pain location: R shoulder Pain description: ache Aggravating factors: laying the wrong way, lifting overhead  Relieving factors: not moving it   PRECAUTIONS: None  RED FLAGS: None   WEIGHT BEARING RESTRICTIONS: No  FALLS:  Has patient fallen in last 6 months? Yes. Number of falls 1  LIVING ENVIRONMENT: Lives with: lives with their family Lives in: House/apartment Stairs: Yes: Internal: 11 steps; on left going up  OCCUPATION: N/A  PLOF: Needs assistance with ADLs  PATIENT GOALS: get my arm back to normal as much as possible   NEXT MD VISIT:   OBJECTIVE:    COGNITION: Overall cognitive status: Within functional limits for tasks assessed     SENSATION: WFL  POSTURE: Rounded shoulders, protracted shoulders  UPPER EXTREMITY ROM:   Active ROM Right eval Left eval  Shoulder flexion PROM 65 Unable to do AROM 70  Shoulder  extension    Shoulder abduction 30 60  Shoulder adduction    Shoulder internal rotation 35 80  Shoulder external rotation Unable to do AROM 30  Elbow flexion [facility] WFL  Elbow extension [facility] WFL   Wrist flexion    Wrist extension    Wrist ulnar deviation    Wrist radial deviation    Wrist pronation    Wrist supination    (Blank rows = not tested)  UPPER EXTREMITY MMT:  MMT Right eval Left eval  Shoulder flexion 2- 3+  Shoulder extension    Shoulder abduction 2- 3+  Shoulder adduction    Shoulder internal rotation 2- 3+  Shoulder external rotation 2- 3+  Middle trapezius    Lower trapezius    Elbow flexion    Elbow extension    Wrist flexion    Wrist extension    Wrist ulnar deviation    Wrist radial deviation    Wrist pronation    Wrist supination    Grip strength (lbs)    (Blank rows = not tested)  JOINT MOBILITY TESTING:  PROM to tolerable ranges, still very tight and guarded   PALPATION:  TTP and still sensitive around incision    TODAY'S TREATMENT:                                                                                                                                         DATE:  11/01/22 Wall slides x10 flexion and abd  Pulleys PROM all directions supine each way, grade 2-3 inferior and posterior glides Sidelying abd x10- only able to do small movements Tricep ext yellow 2x10  1# WaTE seated chest press 2x10  RUE isometrics flex, ext, IR, ER, abd 2 sets of 5 w/ 5'' holds    10/25/22 Ball on table multi direction PROM all directions supine each way, grade 2-3 inferior and posterior glides Supine chest press with dowel x10 Supine AAROM abduction and ER x10  RUE isometrics flex, ext, IR, ER, abd 2 sets of 5 w/ 3s holds against ball on wall  Scapular squeezes 2x10  Shoulder shrugs 2x10 Wall slides x10 Finger ladder x5, able to get to 10th one    10/20/22 Ball on table Scapular squeezes 2x10  Shoulder shrugs 2x10 Biceps Curls 1lb 2x10 Triceps  Ext yellow 2x10 each PROM all directions supine each way, grade 2-3 inferior and posterior glides RUE isometrics flex, ext, IR, ER, abd 2 sets of 3 w/ 5'' holds   10/18/22 Ball on table PROM all directions supine each way, grade 2-3 inferior and posterior glides RUE isometrics flex, ext, IR, ER, abd 2 sets of 3 w/ 5'' holds  Scapular squeezes 2x10  Shoulder shrugs 2x10  10/11/22 Table slides flexion and abd 2x10  Rolling ball on mat multi directions  PROM all directions supine 2x10 each way, grade 2-3 inferior and posterior glides Supine chest press 2x10  AAROM abduction, ER 2x10  Shoulder shrugs 2x10 Scapular squeezes 2x10  Isometrics ER/IR, tried in flexion and abd able to do slightly but compensates heavy x10 3s holds    10/06/22- EVAL    PATIENT EDUCATION: Education details: POC and HEP  Person educated: Patient Education method: Explanation Education comprehension: verbalized understanding  HOME EXERCISE PROGRAM: Access Code: [email or web address] URL: [email or web address] Date: 10/06/2022 Prepared by: Cassie Freer  Exercises - Supine Shoulder Flexion Extension AAROM with Dowel  - 1 x daily - 7 x weekly - 2 sets - 10 reps - Supine Shoulder Abduction AAROM with Dowel  - 1 x daily - 7 x weekly - 2 sets - 10 reps - Supine Shoulder External Rotation in 45 Degrees Abduction AAROM with Dowel  - 1 x daily - 7 x weekly - 2 sets - 10 reps - Seated Shoulder Flexion Towel Slide at Table Top  - 1 x daily - 7 x weekly - 2 sets - 10 reps - Seated Shoulder Abduction Towel Slide at Table Top  - 1 x daily - 7 x weekly - 2 sets - 10 reps - Seated Scapular Retraction  - 1 x daily - 7 x weekly - 2 sets - 10 reps - 3 hold  ASSESSMENT:  CLINICAL IMPRESSION: Patient is a 32 y.o. female who was seen today for physical therapy  treatment for s/p R total reverse shoulder surgery. Pt continues to demonstrate good PROM and AAROM with pulleys. She is still very weak which is limiting her  progression with AROM.   OBJECTIVE IMPAIRMENTS: decreased ROM, decreased strength, impaired UE functional use, improper body mechanics, and pain.   ACTIVITY LIMITATIONS: carrying, lifting, dressing, reach over head, and hygiene/grooming  PARTICIPATION LIMITATIONS: cleaning, laundry, and driving  REHAB POTENTIAL: Good  CLINICAL DECISION MAKING: Evolving/moderate complexity  EVALUATION COMPLEXITY: Low   GOALS: Goals reviewed with patient? Yes  SHORT TERM GOALS: Target date: 11/17/22  Patient will be independent with initial HEP.  Goal status: INITIAL   LONG TERM GOALS: Target date: 12/29/22  Patient will be independent with advanced/ongoing HEP to improve outcomes and carryover.  Goal status: INITIAL  2.  Patient will report 75% improvement in R shoulder pain to improve QOL.  Baseline:  3/10 Goal status: INITIAL  3.  Patient to improve R shoulder AROM to [facility] without pain provocation to allow for increased ease of ADLs.  Baseline: see chart Goal status: INITIAL  4.  Patient will demonstrate improved functional UE strength as demonstrated by 4/5 or better. Baseline: 2- Goal status: INITIAL  PLAN:  PT FREQUENCY: 2x/week  PT DURATION: 12 weeks  PLANNED INTERVENTIONS: Therapeutic exercises, Therapeutic activity, Neuromuscular re-education, Balance training, Gait training, Patient/Family education, Self Care, Joint mobilization, Dry Needling, Electrical stimulation, Cryotherapy, Moist heat, Vasopneumatic device, Ultrasound, Ionotophoresis 4mg /ml Dexamethasone, and Manual therapy  PLAN FOR NEXT SESSION: AAROM, manual therapy, light strengthening as tolerated    Cassie Freer, PT 11/01/2022, 3:45 PM

## 2022-11-01 ENCOUNTER — Ambulatory Visit: Payer: Medicare HMO

## 2022-11-01 DIAGNOSIS — M25611 Stiffness of right shoulder, not elsewhere classified: Secondary | ICD-10-CM

## 2022-11-01 DIAGNOSIS — Z96611 Presence of right artificial shoulder joint: Secondary | ICD-10-CM

## 2022-11-01 DIAGNOSIS — M6281 Muscle weakness (generalized): Secondary | ICD-10-CM

## 2022-11-03 ENCOUNTER — Ambulatory Visit: Payer: Medicare HMO

## 2022-11-03 ENCOUNTER — Other Ambulatory Visit: Payer: Self-pay

## 2022-11-03 ENCOUNTER — Encounter (HOSPITAL_BASED_OUTPATIENT_CLINIC_OR_DEPARTMENT_OTHER): Payer: Self-pay

## 2022-11-03 ENCOUNTER — Emergency Department (HOSPITAL_BASED_OUTPATIENT_CLINIC_OR_DEPARTMENT_OTHER): Payer: Medicare HMO

## 2022-11-03 ENCOUNTER — Emergency Department (HOSPITAL_BASED_OUTPATIENT_CLINIC_OR_DEPARTMENT_OTHER)
Admission: EM | Admit: 2022-11-03 | Discharge: 2022-11-04 | Disposition: A | Discharge status: Home / Self Care | Payer: Medicare HMO | Attending: Emergency Medicine | Admitting: Emergency Medicine

## 2022-11-03 DIAGNOSIS — N186 End stage renal disease: Secondary | ICD-10-CM | POA: Insufficient documentation

## 2022-11-03 DIAGNOSIS — I120 Hypertensive chronic kidney disease with stage 5 chronic kidney disease or end stage renal disease: Secondary | ICD-10-CM | POA: Insufficient documentation

## 2022-11-03 DIAGNOSIS — R112 Nausea with vomiting, unspecified: Secondary | ICD-10-CM | POA: Diagnosis not present

## 2022-11-03 DIAGNOSIS — Z7982 Long term (current) use of aspirin: Secondary | ICD-10-CM | POA: Insufficient documentation

## 2022-11-03 DIAGNOSIS — R110 Nausea: Secondary | ICD-10-CM

## 2022-11-03 DIAGNOSIS — E876 Hypokalemia: Secondary | ICD-10-CM | POA: Insufficient documentation

## 2022-11-03 DIAGNOSIS — E039 Hypothyroidism, unspecified: Secondary | ICD-10-CM | POA: Insufficient documentation

## 2022-11-03 DIAGNOSIS — R0789 Other chest pain: Secondary | ICD-10-CM | POA: Diagnosis present

## 2022-11-03 DIAGNOSIS — R072 Precordial pain: Secondary | ICD-10-CM | POA: Insufficient documentation

## 2022-11-03 DIAGNOSIS — Z992 Dependence on renal dialysis: Secondary | ICD-10-CM | POA: Insufficient documentation

## 2022-11-03 DIAGNOSIS — Z79899 Other long term (current) drug therapy: Secondary | ICD-10-CM | POA: Insufficient documentation

## 2022-11-03 DIAGNOSIS — Z7901 Long term (current) use of anticoagulants: Secondary | ICD-10-CM | POA: Diagnosis not present

## 2022-11-03 LAB — CBC
HCT: 31.6 % — ABNORMAL LOW (ref 36.0–46.0)
Hemoglobin: 9 g/dL — ABNORMAL LOW (ref 12.0–15.0)
MCH: 26 pg (ref 26.0–34.0)
MCHC: 28.5 g/dL — ABNORMAL LOW (ref 30.0–36.0)
MCV: 91.3 fL (ref 80.0–100.0)
Platelets: 131 10*3/uL — ABNORMAL LOW (ref 150–400)
RBC: 3.46 MIL/uL — ABNORMAL LOW (ref 3.87–5.11)
RDW: 20.8 % — ABNORMAL HIGH (ref 11.5–15.5)
WBC: 4.8 10*3/uL (ref 4.0–10.5)
nRBC: 1.5 % — ABNORMAL HIGH (ref 0.0–0.2)

## 2022-11-03 LAB — HEPATIC FUNCTION PANEL
ALT: 9 U/L (ref 0–44)
AST: 17 U/L (ref 15–41)
Albumin: 2.5 g/dL — ABNORMAL LOW (ref 3.5–5.0)
Alkaline Phosphatase: 73 U/L (ref 38–126)
Bilirubin, Direct: 0.2 mg/dL (ref 0.0–0.2)
Indirect Bilirubin: 1 mg/dL — ABNORMAL HIGH (ref 0.3–0.9)
Total Bilirubin: 1.2 mg/dL (ref 0.3–1.2)
Total Protein: 6 g/dL — ABNORMAL LOW (ref 6.5–8.1)

## 2022-11-03 LAB — BASIC METABOLIC PANEL
Anion gap: 10 (ref 5–15)
BUN: 7 mg/dL (ref 6–20)
CO2: 26 mmol/L (ref 22–32)
Calcium: 7.7 mg/dL — ABNORMAL LOW (ref 8.9–10.3)
Chloride: 100 mmol/L (ref 98–111)
Creatinine, Ser: 5.31 mg/dL — ABNORMAL HIGH (ref 0.44–1.00)
GFR, Estimated: 10 mL/min — ABNORMAL LOW (ref >60)
Glucose, Bld: 78 mg/dL (ref 70–99)
Potassium: 2.7 mmol/L — CL (ref 3.5–5.1)
Sodium: 136 mmol/L (ref 135–145)

## 2022-11-03 LAB — TROPONIN I (HIGH SENSITIVITY)
Troponin I (High Sensitivity): 11 ng/L (ref <18)
Troponin I (High Sensitivity): 12 ng/L (ref <18)

## 2022-11-03 LAB — LIPASE, BLOOD: Lipase: 26 U/L (ref 11–51)

## 2022-11-03 MED ORDER — ALUM & MAG HYDROXIDE-SIMETH 200-200-20 MG/5ML PO SUSP
30.0000 mL | Freq: Once | ORAL | Status: AC
  Administered 2022-11-03: 30 mL via ORAL
  Filled 2022-11-03: qty 30

## 2022-11-03 MED ORDER — FENTANYL CITRATE PF 50 MCG/ML IJ SOSY
50.0000 ug | PREFILLED_SYRINGE | Freq: Once | INTRAMUSCULAR | Status: AC
  Administered 2022-11-03: 50 ug via INTRAVENOUS
  Filled 2022-11-03: qty 1

## 2022-11-03 MED ORDER — ONDANSETRON HCL 4 MG/2ML IJ SOLN
4.0000 mg | Freq: Once | INTRAMUSCULAR | Status: DC
  Filled 2022-11-03: qty 2

## 2022-11-03 MED ORDER — ONDANSETRON 4 MG PO TBDP
4.0000 mg | ORAL_TABLET | Freq: Once | ORAL | Status: AC
  Administered 2022-11-03: 4 mg via ORAL
  Filled 2022-11-03: qty 1

## 2022-11-03 NOTE — ED Provider Notes (Signed)
[facility] Provider Note   CSN: [id number] Arrival date & time: 11/03/22  1753     History  Chief Complaint  Patient presents with   Chest Pain    Anne Robinson is a 32 y.o. female.  Patient with past medical history of hypertension, hypothyroidism, SLE, ESRD on hemodialysis Monday/Wednesday/Friday, anemia in ESRD, recent admission/surgery for shoulder prosthetic infection (09/06/22), also admitted at [facility] (7/28 - 10/05/2022) for abdominal pain and vomiting with CT findings as below during that admission, thought to be related to pancreatitis versus gastroparesis --presents for evaluation of chest pressure ongoing over the past 1 week with upper abdominal pain as well.  She has had a lot of nausea, vomited yesterday, but not since.  She has a history of cholecystectomy.  She also states that she is on Carafate which does sometimes help as well as Protonix.  She states that she has had upper endoscopy and colonoscopy but denies any real concerning findings with those.  She presented today because the chest pressure was more than she typically feels.  10/02/22 CT impression: Mild inflammatory changes are noted around the pancreatic head suggesting pancreatitis. Correlation with laboratory data is recommended. Mild wall thickening of right colon is noted suggesting possible infectious or inflammatory colitis. Minimal ascites is noted in the pelvis.        Home Medications Prior to Admission medications   Medication Sig Start Date End Date Taking? Authorizing Provider  amLODipine (NORVASC) 5 MG tablet Take 5 mg by mouth daily. 09/12/21   [provider]  aspirin 81 MG chewable tablet Chew 81 mg by mouth daily. 07/11/21   [provider]  atorvastatin (LIPITOR) 40 MG tablet Take 40 mg by mouth daily. 07/11/21   [provider]  butalbital-acetaminophen-caffeine (FIORICET) 50-325-40 MG tablet Take 1 tablet by mouth  every 6 (six) hours as needed for headache. 09/15/21   Clydie Braun, MD  calcium acetate (PHOSLO) 667 MG capsule Take 667 mg by mouth 3 (three) times daily. 07/22/21   [provider]  cetirizine (ZYRTEC) 10 MG tablet Take 10 mg by mouth daily. 08/14/21   [provider]  dicyclomine (BENTYL) 20 MG tablet Take 1 tablet (20 mg total) by mouth 2 (two) times daily. 05/01/22   Alvira Monday, MD  ELIQUIS 5 MG TABS tablet Take 5 mg by mouth 2 (two) times daily. 07/11/21   [provider]  fluticasone (FLONASE) 50 MCG/ACT nasal spray Place 1 spray into both nostrils daily as needed for allergies.    [provider]  hydrALAZINE (APRESOLINE) 25 MG tablet Take 25 mg by mouth 3 (three) times daily. 09/13/21   [provider]  HYDROcodone-acetaminophen (NORCO/VICODIN) 5-325 MG tablet Take 1-2 tablets by mouth every 6 (six) hours as needed for severe pain. 04/21/22   Pollyann Savoy, MD  ibuprofen (ADVIL) 200 MG tablet Take 800 mg by mouth every 6 (six) hours as needed for headache.    [provider]  levETIRAcetam (KEPPRA) 500 MG tablet Take 500 mg by mouth daily. 08/12/21   [provider]  levothyroxine (SYNTHROID) 50 MCG tablet Take 50 mcg by mouth daily. 09/04/21   [provider]  metoprolol succinate (TOPROL-XL) 50 MG 24 hr tablet Take 50 mg by mouth 2 (two) times daily. 07/11/21   [provider]  ondansetron (ZOFRAN-ODT) 4 MG disintegrating tablet Take 1 tablet (4 mg total) by mouth every 8 (eight) hours as needed for  nausea or vomiting. 05/01/22   Alvira Monday, MD  pantoprazole (PROTONIX) 20 MG tablet Take 2 tablets (40 mg total) by mouth daily for 14 days. 05/01/22 05/15/22  Alvira Monday, MD  predniSONE (DELTASONE) 5 MG tablet Take 5 mg by mouth daily with breakfast.    [provider]  prochlorperazine (COMPAZINE) 10 MG tablet Take 1 tablet (10 mg total) by mouth 2 (two) times daily as needed for nausea or  vomiting. 05/23/22   Tegeler, Canary Brim, MD  tiZANidine (ZANAFLEX) 4 MG tablet Take 4 mg by mouth at bedtime. 09/13/21   [provider]  traZODone (DESYREL) 50 MG tablet Take 25 mg by mouth at bedtime as needed for sleep. 08/14/21   [provider]  temazepam (RESTORIL) 15 MG capsule Take by mouth. 01/21/19 12/07/19  [provider]      Allergies    Reglan [metoclopramide] and Zestril [lisinopril]    Review of Systems   Review of Systems  Physical Exam Updated Vital Signs BP (!) 159/126 (BP Location: Right Arm)   Pulse (!) 106   Temp 98 F (36.7 C) (Oral)   Resp 16   Ht 4\' 11"  (1.499 m)   Wt 66 kg   SpO2 100%   BMI 29.39 kg/m  Physical Exam Vitals and nursing note reviewed.  Constitutional:      Appearance: She is well-developed. She is not diaphoretic.  HENT:     Head: Normocephalic and atraumatic.     Mouth/Throat:     Mouth: Mucous membranes are not dry.  Eyes:     Conjunctiva/sclera: Conjunctivae normal.  Neck:     Vascular: Normal carotid pulses. No JVD.     Trachea: Trachea normal. No tracheal deviation.  Cardiovascular:     Rate and Rhythm: Normal rate and regular rhythm.     Pulses: No decreased pulses.          Radial pulses are 2+ on the right side and 2+ on the left side.     Heart sounds: Normal heart sounds, S1 normal and S2 normal. No murmur heard. Pulmonary:     Effort: Pulmonary effort is normal. No respiratory distress.     Breath sounds: No wheezing.  Chest:     Chest wall: No tenderness.  Abdominal:     General: Bowel sounds are normal.     Palpations: Abdomen is soft.     Tenderness: There is no abdominal tenderness. There is no guarding or rebound.  Musculoskeletal:        General: Normal range of motion.     Cervical back: Normal range of motion and neck supple. No muscular tenderness.  Skin:    General: Skin is warm and dry.     Coloration: Skin is not pale.  Neurological:     Mental Status: She is alert.      ED Results / Procedures / Treatments   Labs (all labs ordered are listed, but only abnormal results are displayed) Labs Reviewed  BASIC METABOLIC PANEL - Abnormal; Notable for the following components:      Result Value   Potassium 2.7 (*)    Creatinine, Ser 5.31 (*)    Calcium 7.7 (*)    GFR, Estimated 10 (*)    All other components within normal limits  CBC - Abnormal; Notable for the following components:   RBC 3.46 (*)    Hemoglobin 9.0 (*)    HCT 31.6 (*)    MCHC 28.5 (*)  RDW 20.8 (*)    Platelets 131 (*)    nRBC 1.5 (*)    All other components within normal limits  HEPATIC FUNCTION PANEL - Abnormal; Notable for the following components:   Total Protein 6.0 (*)    Albumin 2.5 (*)    Indirect Bilirubin 1.0 (*)    All other components within normal limits  LIPASE, BLOOD  PREGNANCY, URINE  TROPONIN I (HIGH SENSITIVITY)  TROPONIN I (HIGH SENSITIVITY)    ED ECG REPORT   Date: 11/03/2022  Rate: 104  Rhythm: sinus tachycardia  QRS Axis: left  Intervals: normal  ST/T Wave abnormalities: nonspecific T wave changes  Conduction Disutrbances:none  Narrative Interpretation:   Old EKG Reviewed: unchanged/similar to 05/2022  I have personally reviewed the EKG tracing and agree with the computerized printout as noted.   Radiology DG Chest 2 View  Result Date: 11/03/2022 CLINICAL DATA:  Chest pain and nausea. EXAM: CHEST - 2 VIEW COMPARISON:  Jul 28, 2022 FINDINGS: There is stable left-sided venous catheter positioning. An intact ventriculoperitoneal shunt catheter is seen. Radiopaque vascular stents are also seen overlying the left axilla, the superior mediastinum and adjacent portions of the bilateral apices. The heart size and mediastinal contours are within normal limits. Both lungs are clear. There is an intact right shoulder replacement. A radiopaque fixation plate and screws are seen within the proximal to mid left humerus. No acute osseous abnormalities are  identified. IMPRESSION: Stable exam without active cardiopulmonary disease. Electronically Signed   By: Aram Candela M.D.   On: 11/03/2022 18:37    Procedures Procedures    Medications Ordered in ED Medications  fentaNYL (SUBLIMAZE) injection 50 mcg (has no administration in time range)  ondansetron (ZOFRAN) injection 4 mg (has no administration in time range)  alum & mag hydroxide-simeth (MAALOX/MYLANTA) 200-200-20 MG/5ML suspension 30 mL (has no administration in time range)    ED Course/ Medical Decision Making/ A&P    Patient seen and examined. History obtained directly from patient.   Labs/EKG: Ordered CBC, CMP, lipase, troponin.  EKG personally reviewed and interpreted as above.  Imaging: Chest x-ray personally reviewed and interpreted as above, agree negative for acute findings.  Medications/Fluids: Ordered: Fentanyl, Zofran.   Most recent vital signs reviewed and are as follows: BP (!) 151/98   Pulse 95   Temp 98 F (36.7 C) (Oral)   Resp 16   Ht 4\' 11"  (1.499 m)   Wt 66 kg   SpO2 100%   BMI 29.39 kg/m   Initial impression: Chest and abdominal pain, sounds acute on chronic.  Patient looks comfortable.  11:26 PM Reassessment performed. Patient appears stable.  Labs personally reviewed and interpreted including: CBC showing chronic anemia with hemoglobin 9.0 likely due to chronic disease, slightly low platelet count at 131, normal white blood cell count; BMP with low potassium noted at 2.7 in this dialysis patient otherwise normal electrolytes and blood sugar; hepatic function panel with low protein and albumin chronic; lipase normal; troponin 11 > 12.   Imaging personally visualized and interpreted including: Chest x-ray agree no acute findings, chronic surgical changes noted.  Reviewed pertinent lab work and imaging with patient at bedside. Questions answered.  She states that she has nausea medicine at home.  She has also been taking Carafate.  Most current  vital signs reviewed and are as follows: BP (!) 150/104   Pulse 98   Temp 98.8 F (37.1 C) (Oral)   Resp 12   Ht 4\' 11"  (1.499  m)   Wt 66 kg   SpO2 100%   BMI 29.39 kg/m   Plan: Discharge to home.   Prescriptions written for: None  Other home care instructions discussed: Close monitoring of symptoms, follow-up in dialysis tomorrow.  Return and follow-up instructions: I encouraged patient to return to ED with severe chest pain, especially if the pain is crushing or pressure-like and spreads to the arms, back, neck, or jaw, or if they have associated sweating, vomiting, or shortness of breath with the pain, or significant pain with activity. We discussed that the evaluation here today indicates a low-risk of serious cause of chest pain, including heart trouble or a blood clot, but no evaluation is perfect and chest pain can evolve with time. The patient verbalized understanding and agreed.  I encouraged patient to follow-up with their provider in the next 48 hours for recheck.                                  Medical Decision Making Amount and/or Complexity of Data Reviewed Labs: ordered. Radiology: ordered.  Risk OTC drugs. Prescription drug management.   For this patient's complaint of abdominal pain, the following conditions were considered on the differential diagnosis: gastritis/PUD, enteritis/duodenitis, appendicitis, cholelithiasis/cholecystitis, cholangitis, pancreatitis, ruptured viscus, colitis, diverticulitis, small/large bowel obstruction, proctitis, cystitis, pyelonephritis, ureteral colic, aortic dissection, aortic aneurysm. In women, ectopic pregnancy, pelvic inflammatory disease, ovarian cysts, and tubo-ovarian abscess were also considered. Atypical chest etiologies were also considered including ACS, PE, and pneumonia.  For this patient's complaint of chest pain, the following emergent conditions were considered on the differential diagnosis: acute coronary syndrome,  pulmonary embolism, pneumothorax, myocarditis, pericardial tamponade, aortic dissection, thoracic aortic aneurysm complication, esophageal perforation.   Other causes were also considered including: gastroesophageal reflux disease, musculoskeletal pain including costochondritis, pneumonia/pleurisy, herpes zoster, pericarditis.  In regards to possibility of ACS, patient has atypical features of pain, non-ischemic and unchanged EKG and negative troponin(s).   In regards to possibility of PE, symptoms are atypical for PE.  Feel that this is unlikely at this time.  She did have surgery greater than 4 weeks ago.  No evidence of heart strain, normal troponins.  Overall symptoms have been chronic and recurrent for the patient.  She is very complex medically with her history.  I have low concern for pancreatitis today.  She is very nauseous but has not been vomiting.  At this point, no indications for admission.  Her potassium is a bit low, but given her ESRD/dialysis status, do not want to replete at this time given that her potassium should trend upward.  Her EKG does not show any concerning findings related to hypokalemia.  She does have dialysis tomorrow and is encouraged to follow-up with them as planned.         Final Clinical Impression(s) / ED Diagnoses Final diagnoses:  Precordial pain  Nausea  Hypokalemia    Rx / DC Orders ED Discharge Orders     None         Renne Crigler, PA-C 11/03/22 2331    Arby Barrette, MD 11/17/22 [id number]

## 2022-11-03 NOTE — Discharge Instructions (Signed)
Please read and follow all provided instructions.  Your diagnoses today include:  1. Precordial pain   2. Nausea   3. Hypokalemia     Tests performed today include: An EKG of your heart: No heart rhythm problems or concerning changes from the low potassium A chest x-ray: Chronic findings but no acute problems noted today Cardiac enzymes - a blood test for heart muscle damage or normal Blood counts and electrolytes: Potassium was low as we discussed, but no other acute problems noted Vital signs. See below for your results today.   Medications prescribed:  None  Take any prescribed medications only as directed.  Follow-up instructions: Please follow-up with your primary care provider as soon as you can for further evaluation of your symptoms.   Return instructions:  SEEK IMMEDIATE MEDICAL ATTENTION IF: You have severe chest pain, especially if the pain is crushing or pressure-like and spreads to the arms, back, neck, or jaw, or if you have sweating, nausea or vomiting, or trouble with breathing. THIS IS AN EMERGENCY. Do not wait to see if the pain will go away. Get medical help at once. Call 911. DO NOT drive yourself to the hospital.  Your chest pain gets worse and does not go away after a few minutes of rest.  You have an attack of chest pain lasting longer than what you usually experience.  You have significant dizziness, if you pass out, or have trouble walking.  You have chest pain not typical of your usual pain for which you originally saw your caregiver.  You have any other emergent concerns regarding your health.  Additional Information: Chest pain comes from many different causes. Your caregiver has diagnosed you as having chest pain that is not specific for one problem, but does not require admission.  You are at low risk for an acute heart condition or other serious illness.   Your vital signs today were: BP (!) 150/104   Pulse 98   Temp 98.8 F (37.1 C) (Oral)    Resp 12   Ht 4\' 11"  (1.499 m)   Wt 66 kg   SpO2 100%   BMI 29.39 kg/m  If your blood pressure (BP) was elevated above 135/85 this visit, please have this repeated by your doctor within one month. --------------

## 2022-11-03 NOTE — Progress Notes (Signed)
Patient ambulated the same distance she usually walks at home.  Upon return to her room her SPO2 was 100% and HR was 118.

## 2022-11-03 NOTE — ED Triage Notes (Addendum)
Pt reports that she has had chest pain for a few day along with abdominal pain. Pt reports that she has been having some episodes of nausea. Pt is on dialysis MWF. Pt aslo report that she has has some shortness of breath as well.

## 2022-11-04 DIAGNOSIS — R072 Precordial pain: Secondary | ICD-10-CM | POA: Diagnosis not present

## 2022-11-04 MED ORDER — ONDANSETRON HCL 4 MG/2ML IJ SOLN
4.0000 mg | Freq: Once | INTRAMUSCULAR | Status: AC
  Administered 2022-11-04: 4 mg via INTRAVENOUS
  Filled 2022-11-04: qty 2

## 2022-11-08 ENCOUNTER — Ambulatory Visit: Payer: Medicare HMO | Attending: Physician Assistant | Admitting: Physical Therapy

## 2022-11-10 ENCOUNTER — Ambulatory Visit: Payer: Medicare HMO | Admitting: Physical Therapy

## 2023-01-17 ENCOUNTER — Other Ambulatory Visit: Payer: Self-pay

## 2023-01-17 ENCOUNTER — Ambulatory Visit: Payer: Medicare HMO | Attending: Hand Surgery | Admitting: Physical Therapy

## 2023-01-17 DIAGNOSIS — M6281 Muscle weakness (generalized): Secondary | ICD-10-CM | POA: Diagnosis present

## 2023-01-17 DIAGNOSIS — Z96611 Presence of right artificial shoulder joint: Secondary | ICD-10-CM | POA: Diagnosis present

## 2023-01-17 DIAGNOSIS — R29898 Other symptoms and signs involving the musculoskeletal system: Secondary | ICD-10-CM

## 2023-01-17 DIAGNOSIS — M25611 Stiffness of right shoulder, not elsewhere classified: Secondary | ICD-10-CM

## 2023-01-17 DIAGNOSIS — M25511 Pain in right shoulder: Secondary | ICD-10-CM | POA: Diagnosis present

## 2023-01-17 DIAGNOSIS — M25612 Stiffness of left shoulder, not elsewhere classified: Secondary | ICD-10-CM

## 2023-01-17 NOTE — Therapy (Signed)
OUTPATIENT PHYSICAL THERAPY SHOULDER EVALUATION   Patient Name: SHAWDA CHANNING MRN: [id number] DOB:1990/11/21, 32 y.o., female Today's Date: 01/17/2023  END OF SESSION:  PT End of Session - 01/17/23 1302     Visit Number 1    Date for PT Re-Evaluation 03/14/23    Authorization Type [employer]    PT Start Time 1303    PT Stop Time 1345    PT Time Calculation (min) 42 min    Activity Tolerance Patient tolerated treatment well             Past Medical History:  Diagnosis Date   HTN (hypertension) 05/15/2016   Lupus (HCC)    Lupus nephritis (HCC)    Pseudotumor cerebri    Renal disorder    Thyroid disease    Past Surgical History:  Procedure Laterality Date   INSERTION OF DIALYSIS CATHETER     RENAL BIOPSY     SHOULDER SURGERY     VENTRICULAR ATRIAL SHUNT Right 11/27/2018   Codman programmable shunt set at 150   Patient Active Problem List   Diagnosis Date Noted   ESRD on dialysis (HCC) 09/15/2021   Idiopathic intracranial hypertension 09/15/2021   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   Anemia of chronic renal failure 09/15/2021   History of DVT (deep vein thrombosis) 09/15/2021   Thrombocytopenia (HCC) 09/15/2021   Hypothyroidism 09/15/2021   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   Complicated migraine 09/14/2021   Cellulitis of chest wall    Acute renal failure (HCC) 01/26/2017   Exacerbation of systemic lupus erythematosus (HCC) 01/26/2017   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   Tachypnea 01/24/2017   Dyspnea 01/24/2017   Macular rash 01/24/2017   Abscess of left axilla 01/24/2017   Abscess of axilla, left 01/23/2017   Hypotension    Sepsis (HCC) 05/15/2016   Elevated troponin 05/15/2016   HTN (hypertension) 05/15/2016   SLE (systemic lupus erythematosus) (HCC) 05/15/2016    PCP: Laqueta Due., MD  REFERRING PROVIDER: Fletcher Anon, MD  REFERRING DIAG: [phone] (ICD-10-CM) - Presence of right artificial shoulder  joint  THERAPY DIAG:  Stiffness of right shoulder, not elsewhere classified  Status post reverse total replacement of right shoulder  Muscle weakness (generalized)  Acute pain of right shoulder  Stiffness of left shoulder, not elsewhere classified  Other symptoms and signs involving the musculoskeletal system  Rationale for Evaluation and Treatment: Rehabilitation  ONSET DATE: 09/19/22 s/p R reverse TSA revision  SUBJECTIVE:  SUBJECTIVE STATEMENT: Pt states she had fistula surgery in September and October so had to stop PT for a while. Did her ortho follow up and was recommended to keep working on her R shoulder. Pt states pain has been okay but just discomfort if she sleeps word. Most concerned about her ROM and strength -- wants to be able to drive again. Pt states she just finished with HHPT last week. Mostly has been working on wing.  Hand dominance: Right  PERTINENT HISTORY: 12/22/22 Revision AV fistula  Per H&P: ESRD on Dialysis, VTE, seizures, sustained a 4-part proximal humerus fractures during a seizure in December 2021, subsequently proximal humerus fixation, complicated with infections treated on IV theray for CONS and po doxy for suppressive therapy stopped this year after seeing ID. She underwent removal of hardware and Right reverse shoulder arthroplasty with Dr Neita Goodnight and Dr Marin Shutter on 06/24/2022, for failed proximal humerus fixation and presents for post-operative evaluation.    PAIN:  Are you having pain? No  PRECAUTIONS: None -- cleared for all ROM  RED FLAGS: None   WEIGHT BEARING RESTRICTIONS: No  FALLS:  Has patient fallen in last 6 months? No  LIVING ENVIRONMENT: Lives with: lives with their family Lives in: House/apartment  OCCUPATION: Not working  PLOF:  Independent  PATIENT GOALS: Return to driving  NEXT MD VISIT:   OBJECTIVE:  Note: Objective measures were completed at Evaluation unless otherwise noted.  DIAGNOSTIC FINDINGS:  Nothing recent since last visit  PATIENT SURVEYS:  FOTO 41; predicted 97  COGNITION: Overall cognitive status: Within functional limits for tasks assessed     SENSATION: WFL  POSTURE: L shoulder elevated vs R Flattened thoracic spine Rounded shoulders  UPPER EXTREMITY ROM:   ROM Right eval Left eval  Shoulder flexion A: 5 P: 120 A: 50  Shoulder extension A: 30 A: 50  Shoulder abduction A: 35 P: 115 A: 60  Shoulder adduction    Shoulder internal rotation A: L hip A: T10  Shoulder external rotation A: 20 P: 55 A: 20  Elbow flexion WNL   Elbow extension WNL   Wrist flexion    Wrist extension    Wrist ulnar deviation    Wrist radial deviation    Wrist pronation    Wrist supination    (Blank rows = not tested)  UPPER EXTREMITY MMT:  MMT Right eval Left eval  Shoulder flexion 2-   Shoulder extension 2   Shoulder abduction 2-   Shoulder adduction    Shoulder internal rotation 2   Shoulder external rotation 2-   Middle trapezius 3   Lower trapezius Unable to test   Elbow flexion 5   Elbow extension 4-   Wrist flexion 5   Wrist extension 5   Wrist ulnar deviation    Wrist radial deviation    Wrist pronation    Wrist supination    Grip strength (lbs)    (Blank rows = not tested)  SHOULDER SPECIAL TESTS: Did not assess  JOINT MOBILITY TESTING:  WNL  PALPATION:  WNL   TODAY'S TREATMENT:  DATE: 01/17/23 See HEP   PATIENT EDUCATION: Education details: Exam findings, initial HEP Person educated: Patient Education method: Explanation, Demonstration, and Handouts Education comprehension: verbalized understanding, returned demonstration,  and needs further education  HOME EXERCISE PROGRAM: Access Code: [email or web address] URL: [email or web address] Date: 01/17/2023 Prepared by: Vernon Prey April Kirstie Peri  Exercises - Isometric Shoulder Flexion at Wall  - 1 x daily - 7 x weekly - 1 sets - 10 reps - 3 sec hold - Isometric Shoulder Extension at Wall  - 1 x daily - 7 x weekly - 1 sets - 10 reps - 3 sec hold - Isometric Shoulder Abduction at Wall  - 1 x daily - 7 x weekly - 1 sets - 10 reps - 3 sec hold - Standing Isometric Shoulder External Rotation with Doorway  - 1 x daily - 7 x weekly - 1 sets - 10 reps - 3 sec hold - Standing Isometric Shoulder Internal Rotation at Doorway  - 1 x daily - 7 x weekly - 1 sets - 10 reps - 3 sec hold  ASSESSMENT:  CLINICAL IMPRESSION: Patient is a 32 y.o. F who was seen today for physical therapy evaluation and treatment s/p R reverse TSA revision. DOS 09/19/22 (now 17 weeks post op). Had holds in PT due to 2 AV fistula repairs -- recently d/c-ed from HHPT and has not been able to focus her rehab back to her R shoulder until now. Pt demos decreased R shoulder/scapular strength and ROM affecting her ability to perform ADLs and IADLs. L shoulder is also limited and pt notes she will likely need surgery for her L UE at some point. Pt will highly benefit from PT to address these deficits to reach pt's goals to return to driving.   OBJECTIVE IMPAIRMENTS: decreased activity tolerance, decreased endurance, decreased mobility, decreased ROM, decreased strength, hypomobility, increased fascial restrictions, impaired UE functional use, improper body mechanics, and postural dysfunction.   ACTIVITY LIMITATIONS: carrying, lifting, bathing, toileting, dressing, reach over head, and hygiene/grooming  PARTICIPATION LIMITATIONS: meal prep, cleaning, laundry, driving, and community activity  PERSONAL FACTORS: Age, Fitness, Past/current experiences, and Time since onset of injury/illness/exacerbation are also  affecting patient's functional outcome.   REHAB POTENTIAL: Good  CLINICAL DECISION MAKING: Evolving/moderate complexity  EVALUATION COMPLEXITY: Moderate   GOALS: Goals reviewed with patient? Yes  SHORT TERM GOALS: Target date: 02/14/2023   Pt will be ind with initial HEP Baseline: Goal status: INITIAL  2.  Pt will demo shoulder flexion and abd PROM to >/=140 deg Baseline:  Goal status: INITIAL  3.  Pt will have improved shoulder flex and abd AROM to >/=60 deg to demo increasing strength Baseline:  Goal status: INITIAL    LONG TERM GOALS: Target date: 03/14/2023   Pt will be ind with management and progression of HEP Baseline:  Goal status: INITIAL  2.  Pt will demo improved shoulder flexion and abd AROM to >/=100 deg for driving tasks Baseline:  Goal status: INITIAL  3.  Pt will be able to lift and carry at least 5# for ADLs and IADLs Baseline:  Goal status: INITIAL  4.  Pt will have increased FOTO score to >/=59 Baseline:  Goal status: INITIAL   PLAN:  PT FREQUENCY: 2x/week  PT DURATION: 8 weeks  PLANNED INTERVENTIONS: 97164- PT Re-evaluation, 97110-Therapeutic exercises, 97530- Therapeutic activity, [id number]- Neuromuscular re-education, 97535- Self Care, 56433- Manual therapy, 97033- Ionotophoresis 4mg /ml Dexamethasone, Patient/Family education, Taping, Dry Needling, Cryotherapy, and Moist heat  PLAN FOR NEXT SESSION: Assess response  to HEP. Continue strengthening as tolerated. Shoulder AROM/PROM.   Flara Storti April Ma L Georgann Bramble, PT 01/17/2023, 2:45 PM

## 2023-02-07 ENCOUNTER — Ambulatory Visit: Payer: Medicare HMO | Admitting: Physical Therapy

## 2023-02-09 ENCOUNTER — Ambulatory Visit: Payer: Medicare HMO

## 2023-02-14 ENCOUNTER — Ambulatory Visit: Payer: Medicare HMO | Attending: Hand Surgery | Admitting: Physical Therapy

## 2023-02-14 ENCOUNTER — Encounter: Payer: Self-pay | Admitting: Physical Therapy

## 2023-02-14 DIAGNOSIS — M25612 Stiffness of left shoulder, not elsewhere classified: Secondary | ICD-10-CM | POA: Diagnosis present

## 2023-02-14 DIAGNOSIS — M25611 Stiffness of right shoulder, not elsewhere classified: Secondary | ICD-10-CM | POA: Diagnosis present

## 2023-02-14 DIAGNOSIS — M6281 Muscle weakness (generalized): Secondary | ICD-10-CM | POA: Diagnosis present

## 2023-02-14 DIAGNOSIS — Z96611 Presence of right artificial shoulder joint: Secondary | ICD-10-CM | POA: Insufficient documentation

## 2023-02-14 DIAGNOSIS — M25511 Pain in right shoulder: Secondary | ICD-10-CM | POA: Diagnosis present

## 2023-02-14 NOTE — Therapy (Signed)
OUTPATIENT PHYSICAL THERAPY SHOULDER  TREATMENT   Patient Name: Anne Robinson MRN: [id number] DOB:10-May-1990, 32 y.o., female Today's Date: 02/14/2023  END OF SESSION:  PT End of Session - 02/14/23 1304     Visit Number 2    Date for PT Re-Evaluation 03/14/23    PT Start Time 1300    PT Stop Time 1345    PT Time Calculation (min) 45 min    Activity Tolerance Patient tolerated treatment well    Behavior During Therapy [facility] for tasks assessed/performed             Past Medical History:  Diagnosis Date   HTN (hypertension) 05/15/2016   Lupus    Lupus nephritis (HCC)    Pseudotumor cerebri    Renal disorder    Thyroid disease    Past Surgical History:  Procedure Laterality Date   INSERTION OF DIALYSIS CATHETER     RENAL BIOPSY     SHOULDER SURGERY     VENTRICULAR ATRIAL SHUNT Right 11/27/2018   Codman programmable shunt set at 150   Patient Active Problem List   Diagnosis Date Noted   ESRD on dialysis (HCC) 09/15/2021   Idiopathic intracranial hypertension 09/15/2021   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   Anemia of chronic renal failure 09/15/2021   History of DVT (deep vein thrombosis) 09/15/2021   Thrombocytopenia (HCC) 09/15/2021   Hypothyroidism 09/15/2021   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   Complicated migraine 09/14/2021   Cellulitis of chest wall    Acute renal failure (HCC) 01/26/2017   Exacerbation of systemic lupus erythematosus (HCC) 01/26/2017   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   Tachypnea 01/24/2017   Dyspnea 01/24/2017   Macular rash 01/24/2017   Abscess of left axilla 01/24/2017   Abscess of axilla, left 01/23/2017   Hypotension    Sepsis (HCC) 05/15/2016   Elevated troponin 05/15/2016   HTN (hypertension) 05/15/2016   SLE (systemic lupus erythematosus) (HCC) 05/15/2016    PCP: Laqueta Due., MD  REFERRING PROVIDER: Fletcher Anon, MD  REFERRING DIAG: [phone] (ICD-10-CM) - Presence of right artificial  shoulder joint  THERAPY DIAG:  Stiffness of right shoulder, not elsewhere classified  Status post reverse total replacement of right shoulder  Acute pain of right shoulder  Muscle weakness (generalized)  Stiffness of left shoulder, not elsewhere classified  Rationale for Evaluation and Treatment: Rehabilitation  ONSET DATE: 09/19/22 s/p R reverse TSA revision  SUBJECTIVE:  SUBJECTIVE STATEMENT: Pt states she had another fistula surgery in her R arm one week ago. Had some shoulder sourness after surgery, "They had my arms dangling in surgery" PERTINENT HISTORY: 12/22/22 Revision AV fistula  Per H&P: ESRD on Dialysis, VTE, seizures, sustained a 4-part proximal humerus fractures during a seizure in December 2021, subsequently proximal humerus fixation, complicated with infections treated on IV theray for CONS and po doxy for suppressive therapy stopped this year after seeing ID. She underwent removal of hardware and Right reverse shoulder arthroplasty with Dr Neita Goodnight and Dr Marin Shutter on 06/24/2022, for failed proximal humerus fixation and presents for post-operative evaluation.    PAIN:  Are you having pain? 6/10 L shoulder   PRECAUTIONS: None -- cleared for all ROM  RED FLAGS: None   WEIGHT BEARING RESTRICTIONS: No  FALLS:  Has patient fallen in last 6 months? No  LIVING ENVIRONMENT: Lives with: lives with their family Lives in: House/apartment  OCCUPATION: Not working  PLOF: Independent  PATIENT GOALS: Return to driving  NEXT MD VISIT:   OBJECTIVE:  Note: Objective measures were completed at Evaluation unless otherwise noted.  DIAGNOSTIC FINDINGS:  Nothing recent since last visit  PATIENT SURVEYS:  FOTO 41; predicted 31  COGNITION: Overall cognitive status: Within functional limits for  tasks assessed     SENSATION: WFL  POSTURE: L shoulder elevated vs R Flattened thoracic spine Rounded shoulders  UPPER EXTREMITY ROM:   ROM Right eval Left eval  Shoulder flexion A: 5 P: 120 A: 50  Shoulder extension A: 30 A: 50  Shoulder abduction A: 35 P: 115 A: 60  Shoulder adduction    Shoulder internal rotation A: L hip A: T10  Shoulder external rotation A: 20 P: 55 A: 20  Elbow flexion WNL   Elbow extension WNL   Wrist flexion    Wrist extension    Wrist ulnar deviation    Wrist radial deviation    Wrist pronation    Wrist supination    (Blank rows = not tested)  UPPER EXTREMITY MMT:  MMT Right eval Left eval  Shoulder flexion 2-   Shoulder extension 2   Shoulder abduction 2-   Shoulder adduction    Shoulder internal rotation 2   Shoulder external rotation 2-   Middle trapezius 3   Lower trapezius Unable to test   Elbow flexion 5   Elbow extension 4-   Wrist flexion 5   Wrist extension 5   Wrist ulnar deviation    Wrist radial deviation    Wrist pronation    Wrist supination    Grip strength (lbs)    (Blank rows = not tested)  SHOULDER SPECIAL TESTS: Did not assess  JOINT MOBILITY TESTING:  WNL  PALPATION:  WNL   TODAY'S TREATMENT:  DATE:  02/14/23 AAROM shoulder Flex, Ext, IR, and abduction x10 Rows & Ex yellow 2x10 AROM ER 2x10 AROM shoulder abd 2x10 Biceps curls dowel 2x10  Triceps Ext  yellow RUE 2x10 RUE PROM with ed range holds  Supine RUE flex x5 assist from therapist Supine AAROM flex 2x5 AAROM supine bench press w/ dowel  01/17/23 See HEP   PATIENT EDUCATION: Education details: Exam findings, initial HEP Person educated: Patient Education method: Explanation, Demonstration, and Handouts Education comprehension: verbalized understanding, returned demonstration, and needs further  education  HOME EXERCISE PROGRAM: Access Code: [email or web address] URL: [email or web address] Date: 01/17/2023 Prepared by: Vernon Prey April Kirstie Peri  Exercises - Isometric Shoulder Flexion at Wall  - 1 x daily - 7 x weekly - 1 sets - 10 reps - 3 sec hold - Isometric Shoulder Extension at Wall  - 1 x daily - 7 x weekly - 1 sets - 10 reps - 3 sec hold - Isometric Shoulder Abduction at Wall  - 1 x daily - 7 x weekly - 1 sets - 10 reps - 3 sec hold - Standing Isometric Shoulder External Rotation with Doorway  - 1 x daily - 7 x weekly - 1 sets - 10 reps - 3 sec hold - Standing Isometric Shoulder Internal Rotation at Doorway  - 1 x daily - 7 x weekly - 1 sets - 10 reps - 3 sec hold  ASSESSMENT:  CLINICAL IMPRESSION: Patient is a 32 y.o. F who was seen today for physical therapy evaluation and treatment s/p R reverse TSA revision. DOS 09/19/22 (now 17 weeks post op). Had holds in PT due to 2 AV fistula repairs -- recently d/c-ed from HHPT and has not been able to focus her rehab back to her R shoulder until now. Upon entering pt reported another fistula surgery last Tuesday in the RUE. Pt is limited with all shoulder ROM and strength. Bilateral shoulder elevation present with active assisted flexion. Difficulty with supine flexion, cues not bend elbows but to flex the shoulders. Pt will highly benefit from PT to address these deficits to reach pt's goals to return to driving.   OBJECTIVE IMPAIRMENTS: decreased activity tolerance, decreased endurance, decreased mobility, decreased ROM, decreased strength, hypomobility, increased fascial restrictions, impaired UE functional use, improper body mechanics, and postural dysfunction.   ACTIVITY LIMITATIONS: carrying, lifting, bathing, toileting, dressing, reach over head, and hygiene/grooming  PARTICIPATION LIMITATIONS: meal prep, cleaning, laundry, driving, and community activity  PERSONAL FACTORS: Age, Fitness, Past/current experiences, and Time  since onset of injury/illness/exacerbation are also affecting patient's functional outcome.   REHAB POTENTIAL: Good  CLINICAL DECISION MAKING: Evolving/moderate complexity  EVALUATION COMPLEXITY: Moderate   GOALS: Goals reviewed with patient? Yes  SHORT TERM GOALS: Target date: 02/14/2023   Pt will be ind with initial HEP Baseline: Goal status: On going  02/14/23  2.  Pt will demo shoulder flexion and abd PROM to >/=140 deg Baseline:  Goal status: INITIAL  3.  Pt will have improved shoulder flex and abd AROM to >/=60 deg to demo increasing strength Baseline:  Goal status: INITIAL    LONG TERM GOALS: Target date: 03/14/2023   Pt will be ind with management and progression of HEP Baseline:  Goal status: INITIAL  2.  Pt will demo improved shoulder flexion and abd AROM to >/=100 deg for driving tasks Baseline:  Goal status: INITIAL  3.  Pt will be able to lift and carry at least 5# for ADLs and IADLs Baseline:  Goal status: INITIAL  4.  Pt will have increased FOTO score to >/=59 Baseline:  Goal status: INITIAL   PLAN:  PT FREQUENCY: 2x/week  PT DURATION: 8 weeks  PLANNED INTERVENTIONS: 97164- PT Re-evaluation, 97110-Therapeutic exercises, 97530- Therapeutic activity, [id number]- Neuromuscular re-education, 97535- Self Care, [id number]- Manual therapy, 97033- Ionotophoresis 4mg /ml Dexamethasone, Patient/Family education, Taping, Dry Needling, Cryotherapy, and Moist heat  PLAN FOR NEXT SESSION: Ask about HEP has not completed. Continue strengthening as tolerated. Shoulder AROM/PROM.   Grayce Sessions, PTA 02/14/2023, 1:05 PM

## 2023-02-16 ENCOUNTER — Ambulatory Visit: Payer: Medicare HMO | Admitting: Physical Therapy

## 2023-02-16 DIAGNOSIS — M6281 Muscle weakness (generalized): Secondary | ICD-10-CM

## 2023-02-16 DIAGNOSIS — Z96611 Presence of right artificial shoulder joint: Secondary | ICD-10-CM

## 2023-02-16 DIAGNOSIS — M25511 Pain in right shoulder: Secondary | ICD-10-CM

## 2023-02-16 DIAGNOSIS — M25611 Stiffness of right shoulder, not elsewhere classified: Secondary | ICD-10-CM

## 2023-02-16 NOTE — Therapy (Signed)
OUTPATIENT PHYSICAL THERAPY SHOULDER  TREATMENT   Patient Name: Anne Robinson MRN: [id number] DOB:1990/07/11, 32 y.o., female Today's Date: 02/16/2023  END OF SESSION:  PT End of Session - 02/16/23 1259     Visit Number 3    Date for PT Re-Evaluation 03/14/23    Authorization Type [employer]    PT Start Time 1300    PT Stop Time 1340    PT Time Calculation (min) 40 min    Activity Tolerance Patient tolerated treatment well    Behavior During Therapy [facility] for tasks assessed/performed              Past Medical History:  Diagnosis Date   HTN (hypertension) 05/15/2016   Lupus    Lupus nephritis (HCC)    Pseudotumor cerebri    Renal disorder    Thyroid disease    Past Surgical History:  Procedure Laterality Date   INSERTION OF DIALYSIS CATHETER     RENAL BIOPSY     SHOULDER SURGERY     VENTRICULAR ATRIAL SHUNT Right 11/27/2018   Codman programmable shunt set at 150   Patient Active Problem List   Diagnosis Date Noted   ESRD on dialysis (HCC) 09/15/2021   Idiopathic intracranial hypertension 09/15/2021   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   Anemia of chronic renal failure 09/15/2021   History of DVT (deep vein thrombosis) 09/15/2021   Thrombocytopenia (HCC) 09/15/2021   Hypothyroidism 09/15/2021   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   Complicated migraine 09/14/2021   Cellulitis of chest wall    Acute renal failure (HCC) 01/26/2017   Exacerbation of systemic lupus erythematosus (HCC) 01/26/2017   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  Rationale for Evaluation and Treatment: Rehabilitation  ONSET DATE: 09/19/22 s/p R reverse TSA revision  SUBJECTIVE:                                                                                                                                                                                      SUBJECTIVE STATEMENT: Pt reports mild pain normally from sleeping wrong.   PERTINENT HISTORY: 12/22/22 Revision AV fistula  Per H&P: ESRD  on Dialysis, VTE, seizures, sustained a 4-part proximal humerus fractures during a seizure in December 2021, subsequently proximal humerus fixation, complicated with infections treated on IV theray for CONS and po doxy for suppressive therapy stopped this year after seeing ID. She underwent removal of hardware and Right reverse shoulder arthroplasty with Dr Neita Goodnight and Dr Marin Shutter on 06/24/2022, for failed proximal humerus fixation and presents for post-operative evaluation.    PAIN:  Are you having pain? 6/10 L shoulder   PRECAUTIONS: None -- cleared for all ROM  RED FLAGS: None   WEIGHT BEARING RESTRICTIONS: No  FALLS:  Has patient fallen in last 6 months? No  LIVING ENVIRONMENT: Lives with: lives with their family Lives in: House/apartment  OCCUPATION: Not working  PLOF: Independent  PATIENT GOALS: Return to driving  NEXT MD VISIT:   OBJECTIVE:  Note: Objective measures were completed at Evaluation unless otherwise noted.  DIAGNOSTIC FINDINGS:  Nothing recent since last visit  PATIENT SURVEYS:  FOTO 41; predicted 68  COGNITION: Overall cognitive status: Within functional limits for tasks assessed     SENSATION: WFL  POSTURE: L shoulder elevated vs R Flattened thoracic spine Rounded shoulders  UPPER EXTREMITY ROM:   ROM Right eval Left eval  Shoulder flexion A: 5 P: 120 A: 50  Shoulder extension A: 30 A: 50  Shoulder abduction  A: 35 P: 115 A: 60  Shoulder adduction    Shoulder internal rotation A: L hip A: T10  Shoulder external rotation A: 20 P: 55 A: 20  Elbow flexion WNL   Elbow extension WNL   Wrist flexion    Wrist extension    Wrist ulnar deviation    Wrist radial deviation    Wrist pronation    Wrist supination    (Blank rows = not tested)  UPPER EXTREMITY MMT:  MMT Right eval Left eval  Shoulder flexion 2-   Shoulder extension 2   Shoulder abduction 2-   Shoulder adduction    Shoulder internal rotation 2   Shoulder external rotation 2-   Middle trapezius 3   Lower trapezius Unable to test   Elbow flexion 5   Elbow extension 4-   Wrist flexion 5   Wrist extension 5   Wrist ulnar deviation    Wrist radial deviation    Wrist pronation    Wrist supination    Grip strength (lbs)    (Blank rows = not tested)  SHOULDER SPECIAL TESTS: Did not assess  JOINT MOBILITY TESTING:  WNL  PALPATION:  WNL   TODAY'S TREATMENT:                                                                                                                                         DATE:  02/16/23 AAROM shoulder flex, abd, CW/CCW, ER/IR with pball x10 Shoulder isometrics into wall: ext, flex, abd, ER/IR 10x3" Row yellow TB 2x10 Shoulder  ext eccentrics yellow TB x10 Standing hands on mat table scap squeeze + serratus anterior push 2x10 Scap clock hands on mat table yellow TB x10 Standing modified dips, hands on mat table 2x10 Supine bicep curl with 1# bar 2x10 AAROM supine bench press with dowel 2x10 AAROM supine ER/IR with dowel 2x10 Supine shoulder flexion eccentrics with therapist assist 5x10"  02/14/23 AAROM shoulder Flex, Ext, IR, and abduction x10 Rows & Ex yellow 2x10 AROM ER 2x10 AROM shoulder abd 2x10 Biceps curls dowel 2x10  Triceps Ext  yellow RUE 2x10 RUE PROM with ed range holds  Supine RUE flex x5 assist from therapist Supine AAROM flex 2x5 AAROM supine bench press w/  dowel  01/17/23 See HEP   PATIENT EDUCATION: Education details: Exam findings, initial HEP Person educated: Patient Education method: Explanation, Demonstration, and Handouts Education comprehension: verbalized understanding, returned demonstration, and needs further education  HOME EXERCISE PROGRAM: Access Code: [email or web address] URL: [email or web address] Date: 01/17/2023 Prepared by: Vernon Prey April Kirstie Peri  Exercises - Isometric Shoulder Flexion at Wall  - 1 x daily - 7 x weekly - 1 sets - 10 reps - 3 sec hold - Isometric Shoulder Extension at Wall  - 1 x daily - 7 x weekly - 1 sets - 10 reps - 3 sec hold - Isometric Shoulder Abduction at Wall  - 1 x daily - 7 x weekly - 1 sets - 10 reps - 3 sec hold - Standing Isometric Shoulder External Rotation with Doorway  - 1 x daily - 7 x weekly - 1 sets - 10 reps - 3 sec hold - Standing Isometric Shoulder Internal Rotation at Doorway  - 1 x daily - 7 x weekly - 1 sets - 10 reps - 3 sec hold  ASSESSMENT:  CLINICAL IMPRESSION: Patient is a 32 y.o. F who was seen today for physical therapy evaluation and treatment s/p R reverse TSA revision. DOS 09/19/22 (now 17 weeks post op). Had holds in PT due to 3 AV fistula repairs (last one on 01/24/23)  -- recently d/c-ed from HHPT and has not been able to focus her rehab back to her R shoulder until now. Session focused on isometrics and eccentric strengthening today as well as increasing some UE weight bearing.   OBJECTIVE IMPAIRMENTS: decreased activity tolerance, decreased endurance, decreased mobility, decreased ROM, decreased strength, hypomobility, increased fascial restrictions, impaired UE functional use, improper body mechanics, and postural dysfunction.     GOALS: Goals reviewed with patient? Yes  SHORT TERM GOALS: Target date: 02/14/2023   Pt will be ind with initial HEP Baseline: Goal status: On going  02/14/23  2.  Pt will demo shoulder flexion and abd PROM to >/=140  deg Baseline:  Goal status: INITIAL  3.  Pt will have improved shoulder flex and abd AROM to >/=60 deg to demo increasing strength Baseline:  Goal status: INITIAL    LONG TERM GOALS: Target date: 03/14/2023   Pt will be ind with management and progression of HEP Baseline:  Goal status: INITIAL  2.  Pt will demo improved shoulder flexion and abd AROM to >/=100 deg for driving tasks Baseline:  Goal status: INITIAL  3.  Pt will be able to lift and carry at least 5# for ADLs and IADLs Baseline:  Goal status: INITIAL  4.  Pt will have increased FOTO score to >/=59 Baseline:  Goal status: INITIAL   PLAN:  PT FREQUENCY: 2x/week  PT DURATION: 8 weeks  PLANNED INTERVENTIONS: 97164- PT Re-evaluation, 97110-Therapeutic exercises,  16109- Therapeutic activity, [id number]- Neuromuscular re-education, [id number]- Self Care, [id number]- Manual therapy, [phone]- Ionotophoresis 4mg /ml Dexamethasone, Patient/Family education, Taping, Dry Needling, Cryotherapy, and Moist heat  PLAN FOR NEXT SESSION: Ask about HEP. Continue strengthening as tolerated. Shoulder AROM/AAROM.   Anne Robinson April Ma L Kelyn Koskela, PT 02/16/2023, 1:00 PM

## 2023-02-21 ENCOUNTER — Ambulatory Visit: Payer: Medicare HMO | Admitting: Physical Therapy

## 2023-02-21 DIAGNOSIS — Z96611 Presence of right artificial shoulder joint: Secondary | ICD-10-CM

## 2023-02-21 DIAGNOSIS — M25612 Stiffness of left shoulder, not elsewhere classified: Secondary | ICD-10-CM

## 2023-02-21 DIAGNOSIS — M25611 Stiffness of right shoulder, not elsewhere classified: Secondary | ICD-10-CM | POA: Diagnosis not present

## 2023-02-21 DIAGNOSIS — M6281 Muscle weakness (generalized): Secondary | ICD-10-CM

## 2023-02-21 DIAGNOSIS — M25511 Pain in right shoulder: Secondary | ICD-10-CM

## 2023-02-21 NOTE — Therapy (Signed)
OUTPATIENT PHYSICAL THERAPY SHOULDER  TREATMENT   Patient Name: Anne Robinson MRN: [id number] DOB:[birth year], 32 y.o., female Today's Date: 02/21/2023  END OF SESSION:  PT End of Session - 02/21/23 1021     Visit Number 4    Date for PT Re-Evaluation 03/14/23    Authorization Type [employer]    PT Start Time 1022   late sign in   PT Stop Time 1100    PT Time Calculation (min) 38 min    Activity Tolerance Patient tolerated treatment well    Behavior During Therapy WFL for tasks assessed/performed             Past Medical History:  Diagnosis Date   HTN (hypertension) 05/15/2016   Lupus    Lupus nephritis (HCC)    Pseudotumor cerebri    Renal disorder    Thyroid disease    Past Surgical History:  Procedure Laterality Date   INSERTION OF DIALYSIS CATHETER     RENAL BIOPSY     SHOULDER SURGERY     VENTRICULAR ATRIAL SHUNT Right 11/27/2018   Codman programmable shunt set at 150   Patient Active Problem List   Diagnosis Date Noted   ESRD on dialysis (HCC) 09/15/2021   Idiopathic intracranial hypertension 09/15/2021   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   Anemia of chronic renal failure 09/15/2021   History of DVT (deep vein thrombosis) 09/15/2021   Thrombocytopenia (HCC) 09/15/2021   Hypothyroidism 09/15/2021   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   Complicated migraine 09/14/2021   Cellulitis of chest wall    Acute renal failure (HCC) 01/26/2017   Exacerbation of systemic lupus erythematosus (HCC) 01/26/2017   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  Rationale for Evaluation and Treatment: Rehabilitation  ONSET DATE: 09/19/22 s/p R reverse TSA revision  SUBJECTIVE:                                                                                                                                                                                      SUBJECTIVE STATEMENT: Pt states she was very sore after last session. Reports she has noted some increase in strength --  has been able to put on her deodorant.   PERTINENT HISTORY: 12/22/22 Revision AV fistula, 01/24/23 revision AV fistula  Per H&P: ESRD on Dialysis, VTE, seizures, sustained a 4-part proximal humerus fractures during a seizure in December 2021, subsequently proximal humerus fixation, complicated with infections treated on IV theray for CONS and po doxy for suppressive therapy stopped this year after seeing ID. She underwent removal of hardware and Right reverse shoulder arthroplasty with Dr Neita Goodnight and Dr Marin Shutter on 06/24/2022, for failed proximal humerus fixation and presents for post-operative evaluation.    PAIN:  Are you having pain? 6/10 L shoulder   PRECAUTIONS: None -- cleared for all ROM  RED FLAGS: None   WEIGHT BEARING RESTRICTIONS: No  FALLS:  Has patient fallen in last 6 months? No  LIVING ENVIRONMENT: Lives with: lives with their family Lives in: House/apartment  OCCUPATION: Not working  PLOF: Independent  PATIENT GOALS: Return to driving  NEXT MD VISIT:   OBJECTIVE:  Note: Objective measures were completed at Evaluation unless otherwise noted.  DIAGNOSTIC FINDINGS:  Nothing recent since last visit  PATIENT SURVEYS:  FOTO 41; predicted 51  COGNITION: Overall cognitive status: Within functional limits for tasks assessed     SENSATION: WFL  POSTURE: L shoulder elevated vs R Flattened thoracic spine Rounded shoulders  UPPER EXTREMITY  ROM:   ROM Right eval Left eval  Shoulder flexion A: 5 P: 120 A: 50  Shoulder extension A: 30 A: 50  Shoulder abduction A: 35 P: 115 A: 60  Shoulder adduction    Shoulder internal rotation A: L hip A: T10  Shoulder external rotation A: 20 P: 55 A: 20  Elbow flexion WNL   Elbow extension WNL   Wrist flexion    Wrist extension    Wrist ulnar deviation    Wrist radial deviation    Wrist pronation    Wrist supination    (Blank rows = not tested)  UPPER EXTREMITY MMT:  MMT Right eval Left eval  Shoulder flexion 2-   Shoulder extension 2   Shoulder abduction 2-   Shoulder adduction    Shoulder internal rotation 2   Shoulder external rotation 2-   Middle trapezius 3   Lower trapezius Unable to test   Elbow flexion 5   Elbow extension 4-   Wrist flexion 5   Wrist extension 5   Wrist ulnar deviation    Wrist radial deviation    Wrist pronation    Wrist supination    Grip strength (lbs)    (Blank rows = not tested)  SHOULDER SPECIAL TESTS: Did not assess  JOINT MOBILITY TESTING:  WNL  PALPATION:  WNL   TODAY'S TREATMENT:                                                                                                                                         DATE:  02/21/23 AAROM shoulder flex, abd, CW/CCW, ER/IR with pball x10 Ball roll up wall x10 Shoulder reactive isometrics yellow TB: ER, IR, flexion, ext, abd x10 each Supine bicep with dowel 2x10 Supine shoulder flexion with dowel 2x5 Supine shoulder abd AAROM with dowel 2x10 Manual therapy: Shoulder PROM all directions  02/16/23 AAROM shoulder flex, abd, CW/CCW, ER/IR with pball x10 Shoulder isometrics into wall: ext, flex, abd, ER/IR 10x3" Row yellow TB 2x10 Shoulder ext eccentrics yellow TB x10 Standing hands on mat table scap squeeze + serratus anterior push 2x10 Scap clock hands on mat table yellow TB x10 Standing modified dips, hands on mat table 2x10 Supine bicep curl with 1# bar 2x10 AAROM  supine bench press with dowel 2x10 AAROM supine ER/IR with dowel 2x10 Supine shoulder flexion eccentrics with therapist assist 5x10"  02/14/23 AAROM shoulder Flex, Ext, IR, and abduction x10 Rows & Ex yellow 2x10 AROM ER 2x10 AROM shoulder abd 2x10 Biceps curls dowel 2x10  Triceps Ext  yellow RUE 2x10 RUE PROM with ed range holds  Supine RUE flex x5 assist from therapist Supine AAROM flex 2x5 AAROM supine bench press w/ dowel  01/17/23 See HEP   PATIENT EDUCATION: Education details: Exam findings, initial HEP Person educated: Patient Education method: Explanation, Demonstration, and Handouts Education comprehension: verbalized understanding, returned demonstration, and needs further education  HOME EXERCISE PROGRAM: Access Code: [email or web address] URL: [email or web address] Date: 01/17/2023 Prepared by: Vernon Prey April Kirstie Peri  Exercises - Isometric Shoulder Flexion at Wall  - 1 x daily - 7 x weekly - 1 sets - 10 reps - 3 sec hold - Isometric Shoulder Extension at Wall  - 1 x daily - 7 x weekly - 1 sets - 10 reps - 3 sec hold - Isometric Shoulder Abduction at Wall  - 1 x daily - 7 x weekly - 1 sets - 10 reps - 3 sec hold - Standing Isometric Shoulder External Rotation with Doorway  - 1 x daily - 7 x weekly - 1 sets - 10 reps - 3 sec hold - Standing Isometric Shoulder Internal Rotation at Doorway  - 1 x daily - 7 x weekly - 1 sets - 10 reps - 3 sec hold  ASSESSMENT:  CLINICAL IMPRESSION: Patient is a 32 y.o. F who was seen today for physical therapy evaluation and treatment s/p R reverse TSA revision. DOS 09/19/22 (now 17 weeks post op). Had holds in PT due to 3 AV fistula repairs (last one on 01/24/23)  -- recently d/c-ed from HHPT and has not been able to focus her rehab back to her R shoulder until now. Decreased intensity of strengthening as pt was very sore after last session. Continued to focus on strengthening.   OBJECTIVE IMPAIRMENTS: decreased activity  tolerance, decreased endurance, decreased mobility, decreased ROM, decreased strength, hypomobility, increased fascial restrictions, impaired UE functional use, improper body mechanics, and postural dysfunction.     GOALS: Goals reviewed with patient? Yes  SHORT TERM GOALS: Target date: 02/14/2023   Pt will be ind with initial HEP Baseline: Goal status: On going  02/14/23  2.  Pt will demo shoulder flexion and abd PROM to >/=140 deg Baseline:  Goal status: INITIAL  3.  Pt will have improved shoulder flex and abd AROM to >/=60 deg to demo increasing strength Baseline:  Goal status: INITIAL    LONG TERM GOALS: Target date: 03/14/2023   Pt will be ind with management and progression of HEP Baseline:  Goal status: INITIAL  2.  Pt will  demo improved shoulder flexion and abd AROM to >/=100 deg for driving tasks Baseline:  Goal status: INITIAL  3.  Pt will be able to lift and carry at least 5# for ADLs and IADLs Baseline:  Goal status: INITIAL  4.  Pt will have increased FOTO score to >/=59 Baseline:  Goal status: INITIAL   PLAN:  PT FREQUENCY: 2x/week  PT DURATION: 8 weeks  PLANNED INTERVENTIONS: 97164- PT Re-evaluation, 97110-Therapeutic exercises, 97530- Therapeutic activity, [id number]- Neuromuscular re-education, 97535- Self Care, 59563- Manual therapy, 97033- Ionotophoresis 4mg /ml Dexamethasone, Patient/Family education, Taping, Dry Needling, Cryotherapy, and Moist heat  PLAN FOR NEXT SESSION: Ask about HEP. Continue strengthening as tolerated. Shoulder AROM/AAROM.   Bartlomiej Jenkinson April Ma L Kalli Greenfield, PT 02/21/2023, 10:22 AM

## 2023-02-23 ENCOUNTER — Ambulatory Visit: Payer: Medicare HMO | Admitting: Physical Therapy

## 2023-02-23 ENCOUNTER — Encounter: Payer: Self-pay | Admitting: Physical Therapy

## 2023-02-23 DIAGNOSIS — Z96611 Presence of right artificial shoulder joint: Secondary | ICD-10-CM

## 2023-02-23 DIAGNOSIS — M25611 Stiffness of right shoulder, not elsewhere classified: Secondary | ICD-10-CM

## 2023-02-23 DIAGNOSIS — M25511 Pain in right shoulder: Secondary | ICD-10-CM

## 2023-02-23 NOTE — Therapy (Signed)
OUTPATIENT PHYSICAL THERAPY SHOULDER  TREATMENT   Patient Name: Anne Robinson MRN: [id number] DOB:12-19-1990, 32 y.o., female Today's Date: 02/23/2023  END OF SESSION:  PT End of Session - 02/23/23 1018     Visit Number 5    Date for PT Re-Evaluation 03/14/23    PT Start Time 1015    PT Stop Time 1100    PT Time Calculation (min) 45 min    Activity Tolerance Patient tolerated treatment well    Behavior During Therapy [facility] for tasks assessed/performed             Past Medical History:  Diagnosis Date   HTN (hypertension) 05/15/2016   Lupus    Lupus nephritis (HCC)    Pseudotumor cerebri    Renal disorder    Thyroid disease    Past Surgical History:  Procedure Laterality Date   INSERTION OF DIALYSIS CATHETER     RENAL BIOPSY     SHOULDER SURGERY     VENTRICULAR ATRIAL SHUNT Right 11/27/2018   Codman programmable shunt set at 150   Patient Active Problem List   Diagnosis Date Noted   ESRD on dialysis (HCC) 09/15/2021   Idiopathic intracranial hypertension 09/15/2021   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   Anemia of chronic renal failure 09/15/2021   History of DVT (deep vein thrombosis) 09/15/2021   Thrombocytopenia (HCC) 09/15/2021   Hypothyroidism 09/15/2021   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   Complicated migraine 09/14/2021   Cellulitis of chest wall    Acute renal failure (HCC) 01/26/2017   Exacerbation of systemic lupus erythematosus (HCC) 01/26/2017   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   Tachypnea 01/24/2017   Dyspnea 01/24/2017   Macular rash 01/24/2017   Abscess of left axilla 01/24/2017   Abscess of axilla, left 01/23/2017   Hypotension    Sepsis (HCC) 05/15/2016   Elevated troponin 05/15/2016   HTN (hypertension) 05/15/2016   SLE (systemic lupus erythematosus) (HCC) 05/15/2016    PCP: Laqueta Due., MD  REFERRING PROVIDER: Fletcher Anon, MD  REFERRING DIAG: [phone] (ICD-10-CM) - Presence of right artificial  shoulder joint  THERAPY DIAG:  Stiffness of right shoulder, not elsewhere classified  Status post reverse total replacement of right shoulder  Acute pain of right shoulder  Rationale for Evaluation and Treatment: Rehabilitation  ONSET DATE: 09/19/22 s/p R reverse TSA revision  SUBJECTIVE:                                                                                                                                                                                      SUBJECTIVE STATEMENT: "Feeling tired today, but whatever"  PERTINENT HISTORY: 12/22/22  Revision AV fistula, 01/24/23 revision AV fistula  Per H&P: ESRD on Dialysis, VTE, seizures, sustained a 4-part proximal humerus fractures during a seizure in December 2021, subsequently proximal humerus fixation, complicated with infections treated on IV theray for CONS and po doxy for suppressive therapy stopped this year after seeing ID. She underwent removal of hardware and Right reverse shoulder arthroplasty with Dr Neita Goodnight and Dr Marin Shutter on 06/24/2022, for failed proximal humerus fixation and presents for post-operative evaluation.    PAIN:  Are you having pain? 0/10 L shoulder   PRECAUTIONS: None -- cleared for all ROM  RED FLAGS: None   WEIGHT BEARING RESTRICTIONS: No  FALLS:  Has patient fallen in last 6 months? No  LIVING ENVIRONMENT: Lives with: lives with their family Lives in: House/apartment  OCCUPATION: Not working  PLOF: Independent  PATIENT GOALS: Return to driving  NEXT MD VISIT:   OBJECTIVE:  Note: Objective measures were completed at Evaluation unless otherwise noted.  DIAGNOSTIC FINDINGS:  Nothing recent since last visit  PATIENT SURVEYS:  FOTO 41; predicted 80  COGNITION: Overall cognitive status: Within functional limits for tasks assessed     SENSATION: WFL  POSTURE: L shoulder elevated vs R Flattened thoracic spine Rounded shoulders  UPPER EXTREMITY ROM:   ROM Right eval  Left eval   Shoulder flexion A: 5 P: 120 A: 50   Shoulder extension A: 30 A: 50   Shoulder abduction A: 35 P: 115 A: 60   Shoulder adduction     Shoulder internal rotation A: L hip A: T10   Shoulder external rotation A: 20 P: 55 A: 20   Elbow flexion WNL    Elbow extension WNL    Wrist flexion     Wrist extension     Wrist ulnar deviation     Wrist radial deviation     Wrist pronation     Wrist supination     (Blank rows = not tested)  UPPER EXTREMITY MMT:  MMT Right eval Left eval  Shoulder flexion 2-   Shoulder extension 2   Shoulder abduction 2-   Shoulder adduction    Shoulder internal rotation 2   Shoulder external rotation 2-   Middle trapezius 3   Lower trapezius Unable to test   Elbow flexion 5   Elbow extension 4-   Wrist flexion 5   Wrist extension 5   Wrist ulnar deviation    Wrist radial deviation    Wrist pronation    Wrist supination    Grip strength (lbs)    (Blank rows = not tested)  SHOULDER SPECIAL TESTS: Did not assess  JOINT MOBILITY TESTING:  WNL  PALPATION:  WNL   TODAY'S TREATMENT:  DATE:  02/23/23 AAROM shoulder flex, abd, IR, Ext 2x10 Rows and Ext red 2x10 Biceps curls dowel 2x10  Triceps Ext red 2x10 Supine shoulder flexion with dowel 3x5 Supine RUE Er 2x10 AROM, Yellow tband x10 very weak RUE Supine chest press 2x10 Supine Flex RUE 2x5 Shoulder Ext isometric 2x5 Manual therapy: Shoulder PROM all directions  02/21/23 AAROM shoulder flex, abd, CW/CCW, ER/IR with pball x10 Ball roll up wall x10 Shoulder reactive isometrics yellow TB: ER, IR, flexion, ext, abd x10 each Supine bicep with dowel 2x10 Supine shoulder flexion with dowel 2x5 Supine shoulder abd AAROM with dowel 2x10 Manual therapy: Shoulder PROM all directions  02/16/23 AAROM shoulder flex, abd, CW/CCW, ER/IR with pball  x10 Shoulder isometrics into wall: ext, flex, abd, ER/IR 10x3" Row yellow TB 2x10 Shoulder ext eccentrics yellow TB x10 Standing hands on mat table scap squeeze + serratus anterior push 2x10 Scap clock hands on mat table yellow TB x10 Standing modified dips, hands on mat table 2x10 Supine bicep curl with 1# bar 2x10 AAROM supine bench press with dowel 2x10 AAROM supine ER/IR with dowel 2x10 Supine shoulder flexion eccentrics with therapist assist 5x10"  02/14/23 AAROM shoulder Flex, Ext, IR, and abduction x10 Rows & Ex yellow 2x10 AROM ER 2x10 AROM shoulder abd 2x10 Biceps curls dowel 2x10  Triceps Ext  yellow RUE 2x10 RUE PROM with ed range holds  Supine RUE flex x5 assist from therapist Supine AAROM flex 2x5 AAROM supine bench press w/ dowel  01/17/23 See HEP   PATIENT EDUCATION: Education details: Exam findings, initial HEP Person educated: Patient Education method: Explanation, Demonstration, and Handouts Education comprehension: verbalized understanding, returned demonstration, and needs further education  HOME EXERCISE PROGRAM: Access Code: [email or web address] URL: [email or web address] Date: 01/17/2023 Prepared by: Vernon Prey April Kirstie Peri  Exercises - Isometric Shoulder Flexion at Wall  - 1 x daily - 7 x weekly - 1 sets - 10 reps - 3 sec hold - Isometric Shoulder Extension at Wall  - 1 x daily - 7 x weekly - 1 sets - 10 reps - 3 sec hold - Isometric Shoulder Abduction at Wall  - 1 x daily - 7 x weekly - 1 sets - 10 reps - 3 sec hold - Standing Isometric Shoulder External Rotation with Doorway  - 1 x daily - 7 x weekly - 1 sets - 10 reps - 3 sec hold - Standing Isometric Shoulder Internal Rotation at Doorway  - 1 x daily - 7 x weekly - 1 sets - 10 reps - 3 sec hold  ASSESSMENT:  CLINICAL IMPRESSION: Patient is a 32 y.o. F who was seen today for physical therapy evaluation and treatment s/p R reverse TSA revision. DOS 09/19/22 (now 17 weeks post op). Had  holds in PT due to 3 AV fistula repairs (last one on 01/24/23)  -- recently d/c-ed from [facility] and has not been able to focus her rehab back to her R shoulder until now. Add more lithe strengthing with and without resistance. AROM can be challenging for patient. She has good PROM with some end range pain  OBJECTIVE IMPAIRMENTS: decreased activity tolerance, decreased endurance, decreased mobility, decreased ROM, decreased strength, hypomobility, increased fascial restrictions, impaired UE functional use, improper body mechanics, and postural dysfunction.     GOALS: Goals reviewed with patient? Yes  SHORT TERM GOALS: Target date: 02/14/2023   Pt will be ind with initial HEP Baseline: Goal status: Met 02/23/23  2.  Pt will demo shoulder flexion and abd PROM  to >/=140 deg Baseline:  Goal status: Progressing 02/23/23  3.  Pt will have improved shoulder flex and abd AROM to >/=60 deg to demo increasing strength Baseline:  Goal status: INITIAL    LONG TERM GOALS: Target date: 03/14/2023   Pt will be ind with management and progression of HEP Baseline:  Goal status: INITIAL  2.  Pt will demo improved shoulder flexion and abd AROM to >/=100 deg for driving tasks Baseline:  Goal status: INITIAL  3.  Pt will be able to lift and carry at least 5# for ADLs and IADLs Baseline:  Goal status: INITIAL  4.  Pt will have increased FOTO score to >/=59 Baseline:  Goal status: INITIAL   PLAN:  PT FREQUENCY: 2x/week  PT DURATION: 8 weeks  PLANNED INTERVENTIONS: 97164- PT Re-evaluation, 97110-Therapeutic exercises, 97530- Therapeutic activity, [id number]- Neuromuscular re-education, 97535- Self Care, [id number]- Manual therapy, 97033- Ionotophoresis 4mg /ml Dexamethasone, Patient/Family education, Taping, Dry Needling, Cryotherapy, and Moist heat  PLAN FOR NEXT SESSION: Ask about HEP. Continue strengthening as tolerated. Shoulder AROM/AAROM.   Grayce Sessions, PTA 02/23/2023, 10:19 AM

## 2023-03-02 ENCOUNTER — Ambulatory Visit: Payer: Medicare HMO

## 2023-03-07 ENCOUNTER — Ambulatory Visit: Payer: Medicare HMO

## 2023-03-09 ENCOUNTER — Other Ambulatory Visit: Payer: Self-pay | Admitting: Medical Genetics

## 2023-03-09 ENCOUNTER — Ambulatory Visit: Payer: Medicare HMO | Admitting: Physical Therapy

## 2023-03-24 ENCOUNTER — Other Ambulatory Visit: Payer: Self-pay

## 2023-03-24 ENCOUNTER — Emergency Department (HOSPITAL_BASED_OUTPATIENT_CLINIC_OR_DEPARTMENT_OTHER)
Admission: EM | Admit: 2023-03-24 | Discharge: 2023-03-24 | Disposition: A | Discharge status: Home / Self Care | Payer: Medicare Other | Attending: Emergency Medicine | Admitting: Emergency Medicine

## 2023-03-24 ENCOUNTER — Encounter (HOSPITAL_BASED_OUTPATIENT_CLINIC_OR_DEPARTMENT_OTHER): Payer: Self-pay | Admitting: Emergency Medicine

## 2023-03-24 DIAGNOSIS — N764 Abscess of vulva: Secondary | ICD-10-CM | POA: Diagnosis present

## 2023-03-24 DIAGNOSIS — L0291 Cutaneous abscess, unspecified: Secondary | ICD-10-CM

## 2023-03-24 DIAGNOSIS — Z7901 Long term (current) use of anticoagulants: Secondary | ICD-10-CM | POA: Insufficient documentation

## 2023-03-24 DIAGNOSIS — N186 End stage renal disease: Secondary | ICD-10-CM | POA: Diagnosis not present

## 2023-03-24 DIAGNOSIS — Z7982 Long term (current) use of aspirin: Secondary | ICD-10-CM | POA: Diagnosis not present

## 2023-03-24 DIAGNOSIS — Z992 Dependence on renal dialysis: Secondary | ICD-10-CM | POA: Diagnosis not present

## 2023-03-24 MED ORDER — FLUCONAZOLE 150 MG PO TABS: 150.0000 mg | ORAL_TABLET | Freq: Once | ORAL | 0 refills | Status: AC

## 2023-03-24 MED ORDER — LIDOCAINE-EPINEPHRINE 2 %-1:100000 IJ SOLN: 10.0000 mL | Freq: Once | INTRAMUSCULAR | Status: DC

## 2023-03-24 MED ORDER — DOXYCYCLINE HYCLATE 100 MG PO TABS: 100.0000 mg | ORAL_TABLET | Freq: Two times a day (BID) | ORAL | 0 refills | Status: AC

## 2023-03-24 MED ORDER — LIDOCAINE-EPINEPHRINE 1 %-1:100000 IJ SOLN
20.0000 mL | Freq: Once | INTRAMUSCULAR | Status: AC
  Administered 2023-03-24: 5 mL via INTRADERMAL

## 2023-03-24 MED ORDER — MORPHINE SULFATE (PF) 4 MG/ML IV SOLN
4.0000 mg | Freq: Once | INTRAVENOUS | Status: AC
Start: 2023-03-24 — End: 2023-03-24
  Administered 2023-03-24: 4 mg via INTRAVENOUS
  Filled 2023-03-24: qty 1

## 2023-03-24 MED ORDER — PENTAFLUOROPROP-TETRAFLUOROETH EX AERO
INHALATION_SPRAY | CUTANEOUS | Status: DC | PRN
  Administered 2023-03-24 (×2): 30 via TOPICAL
  Filled 2023-03-24 (×3): qty 30

## 2023-03-24 MED ORDER — LIDOCAINE-EPINEPHRINE-TETRACAINE (LET) TOPICAL GEL
3.0000 mL | Freq: Once | TOPICAL | Status: AC
  Administered 2023-03-24: 3 mL via TOPICAL
  Filled 2023-03-24: qty 3

## 2023-03-24 NOTE — ED Triage Notes (Signed)
Pt reports vaginal abscess, was seen at [facility] and sent her for pain control with lysing, reports this is a recurrent issue

## 2023-03-24 NOTE — ED Notes (Signed)
Pt. Abscess has been assessed by EDP and will be opened by the EDP.

## 2023-03-24 NOTE — ED Provider Notes (Signed)
[facility] Provider Note   CSN: [id number] Arrival date & time: 03/24/23  1348     History  Chief Complaint  Patient presents with   Abscess    Anne Robinson is a 33 y.o. female.  33 year old female with history of lupus, ESRD on dialysis, possible hidradenitis suppurativa with recurrent labial and axillary abscesses, history of DVT on Eliquis presenting for left labial abscess.  Onset about 3 days ago and has since been worsening with increasing size and pain.  She visited her PCP today and attempted to have it drained but after starting the procedure she had unbearable pain and was directed to come to the ED to have it drained with IV pain medicine.  She denies any recent fevers, abdominal pain, dysuria, vaginal discharge, nausea/vomiting, chest pain/shortness of breath.  Denies concern for STD.  She has had a labial abscess in this area before which resolved on its own after bursting and draining.  She has not noticed any pus drainage or bleeding from this abscess yet.  She denies any other abscesses or boils in any other parts of her body today.   Abscess Associated symptoms: no fever, no headaches, no nausea and no vomiting       Home Medications Prior to Admission medications   Medication Sig Start Date End Date Taking? Authorizing Provider  amLODipine (NORVASC) 5 MG tablet Take 5 mg by mouth daily. 09/12/21   [provider]  aspirin 81 MG chewable tablet Chew 81 mg by mouth daily. 07/11/21   [provider]  atorvastatin (LIPITOR) 40 MG tablet Take 40 mg by mouth daily. 07/11/21   [provider]  butalbital-acetaminophen-caffeine (FIORICET) 50-325-40 MG tablet Take 1 tablet by mouth every 6 (six) hours as needed for headache. 09/15/21   Clydie Braun, MD  calcium acetate (PHOSLO) 667 MG capsule Take 667 mg by mouth 3 (three) times daily. 07/22/21   [provider]  cetirizine (ZYRTEC) 10 MG  tablet Take 10 mg by mouth daily. 08/14/21   [provider]  dicyclomine (BENTYL) 20 MG tablet Take 1 tablet (20 mg total) by mouth 2 (two) times daily. 05/01/22   Alvira Monday, MD  ELIQUIS 5 MG TABS tablet Take 5 mg by mouth 2 (two) times daily. 07/11/21   [provider]  fluticasone (FLONASE) 50 MCG/ACT nasal spray Place 1 spray into both nostrils daily as needed for allergies.    [provider]  hydrALAZINE (APRESOLINE) 25 MG tablet Take 25 mg by mouth 3 (three) times daily. 09/13/21   [provider]  HYDROcodone-acetaminophen (NORCO/VICODIN) 5-325 MG tablet Take 1-2 tablets by mouth every 6 (six) hours as needed for severe pain. 04/21/22   Pollyann Savoy, MD  ibuprofen (ADVIL) 200 MG tablet Take 800 mg by mouth every 6 (six) hours as needed for headache.    [provider]  levETIRAcetam (KEPPRA) 500 MG tablet Take 500 mg by mouth daily. 08/12/21   [provider]  levothyroxine (SYNTHROID) 50 MCG tablet Take 50 mcg by mouth daily. 09/04/21   [provider]  metoprolol succinate (TOPROL-XL) 50 MG 24 hr tablet Take 50 mg by mouth 2 (two) times daily. 07/11/21   [provider]  ondansetron (ZOFRAN-ODT) 4 MG disintegrating tablet Take 1 tablet (4 mg total) by mouth every 8 (eight) hours as needed for nausea or vomiting. 05/01/22   Alvira Monday, MD  pantoprazole (PROTONIX) 20 MG tablet Take 2 tablets (  40 mg total) by mouth daily for 14 days. 05/01/22 05/15/22  Alvira Monday, MD  predniSONE (DELTASONE) 5 MG tablet Take 5 mg by mouth daily with breakfast.    [provider]  prochlorperazine (COMPAZINE) 10 MG tablet Take 1 tablet (10 mg total) by mouth 2 (two) times daily as needed for nausea or vomiting. 05/23/22   Tegeler, Canary Brim, MD  tiZANidine (ZANAFLEX) 4 MG tablet Take 4 mg by mouth at bedtime. 09/13/21   [provider]  traZODone (DESYREL) 50 MG tablet Take 25 mg by mouth at bedtime as needed for  sleep. 08/14/21   [provider]  temazepam (RESTORIL) 15 MG capsule Take by mouth. 01/21/19 12/07/19  [provider]      Allergies    Reglan [metoclopramide], Dilaudid [hydromorphone], Ivp dye [iodinated contrast media], Oxycodone, and Zestril [lisinopril]    Review of Systems   Review of Systems  Constitutional:  Negative for fever.  Respiratory:  Negative for shortness of breath.   Cardiovascular:  Negative for chest pain.  Gastrointestinal:  Negative for abdominal pain, diarrhea, nausea and vomiting.  Genitourinary:  Positive for vaginal pain. Negative for dysuria, vaginal bleeding and vaginal discharge.  Neurological:  Negative for weakness and headaches.    Physical Exam Updated Vital Signs BP 126/88 (BP Location: Left Arm)   Pulse 100   Temp 98.6 F (37 C) (Oral)   Resp 20   Ht 4\' 11"  (1.499 m)   Wt 68 kg   SpO2 98%   BMI 30.28 kg/m  Physical Exam Exam conducted with a chaperone present.  Constitutional:      General: She is not in acute distress.    Appearance: Normal appearance.  HENT:     Head: Normocephalic and atraumatic.     Mouth/Throat:     Mouth: Mucous membranes are moist.     Pharynx: Oropharynx is clear.  Eyes:     Extraocular Movements: Extraocular movements intact.     Conjunctiva/sclera: Conjunctivae normal.  Cardiovascular:     Rate and Rhythm: Regular rhythm.     Heart sounds: Normal heart sounds. No murmur heard. Pulmonary:     Effort: No respiratory distress.     Breath sounds: Normal breath sounds. No wheezing, rhonchi or rales.  Abdominal:     General: There is no distension.     Palpations: Abdomen is soft.     Tenderness: There is no abdominal tenderness.  Genitourinary:      Comments: 5-6cm abscess along external L labia with underlying fluctuance. Skin is erythematous and the area is tender to palpation. POCUS revealed significant soft tissue swelling and fluid collection Musculoskeletal:        General: No  swelling or deformity.     Right lower leg: No edema.     Left lower leg: No edema.  Neurological:     Mental Status: She is alert.     ED Results / Procedures / Treatments   Labs (all labs ordered are listed, but only abnormal results are displayed) Labs Reviewed - No data to display  EKG None  Radiology No results found.  Procedures .Incision and Drainage  Date/Time: 03/24/2023 5:33 PM  Performed by: Heide Scales, MD Authorized by: Heide Scales, MD   Consent:    Consent obtained:  Verbal   Consent given by:  Patient   Risks, benefits, and alternatives were discussed: yes     Risks discussed:  Bleeding, pain, incomplete drainage, infection and damage to  other organs   Alternatives discussed:  No treatment, delayed treatment, observation, alternative treatment and referral Universal protocol:    Procedure explained and questions answered to patient or proxy's satisfaction: yes     Relevant documents present and verified: yes     Site/side marked: yes     Immediately prior to procedure, a time out was called: yes     Patient identity confirmed:  Verbally with patient and arm band Location:    Type:  Abscess   Size:  6cm   Location: vulvar. Pre-procedure details:    Skin preparation:  Chlorhexidine Sedation:    Sedation type:  None Anesthesia:    Anesthesia method:  Topical application and local infiltration   Topical anesthetic:  LET   Local anesthetic:  Lidocaine 1% WITH epi Procedure type:    Complexity:  Simple Procedure details:    Ultrasound guidance: no     Needle aspiration: no     Incision types:  Stab incision   Wound management:  Irrigated with saline   Drainage:  Serosanguinous and bloody   Drainage amount:  Moderate   Wound treatment:  Wound left open   Packing materials:  None Post-procedure details:    Procedure completion:  Tolerated well, no immediate complications     Medications Ordered in ED Medications   pentafluoroprop-tetrafluoroeth (GEBAUERS) aerosol (30 Applications Topical Given by Other 03/24/23 1725)  morphine (PF) 4 MG/ML injection 4 mg (4 mg Intravenous Given 03/24/23 1647)  lidocaine-EPINEPHrine-tetracaine (LET) topical gel (3 mLs Topical Given 03/24/23 1724)  lidocaine-EPINEPHrine (XYLOCAINE W/EPI) 1 %-1:100000 (with pres) injection 20 mL (5 mLs Intradermal Given by Other 03/24/23 1727)    ED Course/ Medical Decision Making/ A&P                                 Medical Decision Making 33 year old female with history of lupus, ESRD on dialysis, history of DVT on Eliquis, possible history of hidradenitis suppurativa presenting for left labial/vulvar abscess worsening over the past 3 days. Given external location favor soft tissue infection rather than bartholin cyst. Reassuringly no systemic symptoms including fever, abdominal pain, nausea/vomiting.  Hemodynamically stable.  I&D attempted outpatient but procedure was aborted due to pain.  Will give IV morphine here and attempt I&D.  Patient will likely need antibiotics to prevent recurrence, may need further outpatient evaluation for hidradenitis and/or suppressive treatment for this.  Considered risk of bleeding given the patient is on Eliquis however discussed with pharmacist and upon shared decision making with patient decided to proceed.  6:22 PM  I&D completed as noted. Pt tolerated procedure well. Single small incision made which led to serosanguinous drainage and deflation of her abscess. The lesion did not resolve completely but given improvement and that she is on Eliquis decided to stop after single incision.  Will discharge patient with 10 day course of doxycycline, per her request also sent diflucan in case her antibiotic course causes a yeast infection. Pt to follow up with GYN and PCP. Discussed supportive care and return precautions.  Risk Prescription drug management.          Final Clinical Impression(s) / ED  Diagnoses Final diagnoses:  None    Rx / DC Orders ED Discharge Orders     None         Vonna Drafts, MD 03/24/23 1954    Tegeler, Canary Brim, MD 03/24/23 2348

## 2023-03-24 NOTE — Discharge Instructions (Addendum)
Please take doxycycline twice daily for 10 days to treat any underlying infection  You can use Diflucan after completing the course of antibiotics for any yeast infection

## 2023-04-05 ENCOUNTER — Other Ambulatory Visit (HOSPITAL_COMMUNITY): Payer: Self-pay

## 2023-04-07 ENCOUNTER — Other Ambulatory Visit (HOSPITAL_COMMUNITY)
Admission: RE | Admit: 2023-04-07 | Discharge: 2023-04-07 | Disposition: A | Discharge status: Home / Self Care | Payer: Self-pay | Source: Ambulatory Visit | Attending: Oncology | Admitting: Oncology

## 2023-04-20 ENCOUNTER — Ambulatory Visit: Payer: Medicare Other | Attending: Hand Surgery

## 2023-04-20 DIAGNOSIS — M25611 Stiffness of right shoulder, not elsewhere classified: Secondary | ICD-10-CM | POA: Insufficient documentation

## 2023-04-20 DIAGNOSIS — M25511 Pain in right shoulder: Secondary | ICD-10-CM | POA: Diagnosis present

## 2023-04-20 DIAGNOSIS — M6281 Muscle weakness (generalized): Secondary | ICD-10-CM | POA: Diagnosis present

## 2023-04-20 DIAGNOSIS — Z96611 Presence of right artificial shoulder joint: Secondary | ICD-10-CM | POA: Diagnosis present

## 2023-04-20 DIAGNOSIS — M25612 Stiffness of left shoulder, not elsewhere classified: Secondary | ICD-10-CM | POA: Diagnosis present

## 2023-04-20 LAB — GENECONNECT MOLECULAR SCREEN: Genetic Analysis Overall Interpretation: NEGATIVE

## 2023-04-20 NOTE — Therapy (Addendum)
OUTPATIENT PHYSICAL THERAPY SHOULDER EVALUATION   Patient Name: Anne Robinson MRN: [id number] DOB:[birth year], 33 y.o., female Today's Date: 04/20/2023  END OF SESSION:   Past Medical History:  Diagnosis Date   HTN (hypertension) 05/15/2016   Lupus    Lupus nephritis (HCC)    Pseudotumor cerebri    Renal disorder    Thyroid disease    Past Surgical History:  Procedure Laterality Date   INSERTION OF DIALYSIS CATHETER     RENAL BIOPSY     SHOULDER SURGERY     VENTRICULAR ATRIAL SHUNT Right 11/27/2018   Codman programmable shunt set at 150   Patient Active Problem List   Diagnosis Date Noted   ESRD on dialysis (HCC) 09/15/2021   Idiopathic intracranial hypertension 09/15/2021   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   Anemia of chronic renal failure 09/15/2021   History of DVT (deep vein thrombosis) 09/15/2021   Thrombocytopenia (HCC) 09/15/2021   Hypothyroidism 09/15/2021   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   Complicated migraine 09/14/2021   Cellulitis of chest wall    Acute renal failure (HCC) 01/26/2017   Exacerbation of systemic lupus erythematosus (HCC) 01/26/2017   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 Ninetta Lights  REFERRING PROVIDER: Dr. Fanny Dance  REFERRING DIAG: s/p right rTSA  THERAPY DIAG:  No diagnosis found.  Rationale for Evaluation and Treatment: Rehabilitation  ONSET DATE: 09/06/22  SUBJECTIVE:                                                                                                                                                                                      SUBJECTIVE STATEMENT: Went for follow up  January 29th. Waited for new referral to come back. I have not been doing my exercises and I feel my shoulder are tight and need to be stretched.  Hand dominance: Right  PERTINENT HISTORY: HTN Systemic Lupus Erythematosus ESRD Immunocompromised Hx of DVT Hx of rTSA - 09/19/22, 06/24/22 L AV Fistula revision - 01/24/23  PAIN:  Are you having pain? Yes: NPRS scale: 3/10 Pain location: right shoulder Pain description: sharp sometimes, others achy Aggravating factors: moving into certain positions Relieving factors: laying on her back, sometimes hydrocodone, tylenol, muscle relaxer, topical advil   PRECAUTIONS: Fall and Other: AV Shunt L UE - no BP cuff  RED FLAGS:  None   WEIGHT BEARING RESTRICTIONS: No  FALLS:  Has patient fallen in last 6 months? No  LIVING ENVIRONMENT: Lives with: lives with their family Lives in: House/apartment Stairs:  none Has following equipment at home: Single point cane, Environmental consultant - 4 wheeled, and Wheelchair (manual), utilize rollator post dialysis days  OCCUPATION: Nothing rn, previously Occupational psychologist; wants to get into medical billing  PLOF: Independent and Independent with basic ADLs  PATIENT GOALS:improve ROM, be able to lift arms overhead, driving  NEXT MD VISIT:   OBJECTIVE:  Note: Objective measures were completed at Evaluation unless otherwise noted.  DIAGNOSTIC FINDINGS:  No recent relevant imaging  COGNITION: Overall cognitive status: Within functional limits for tasks assessed     SENSATION: WFL   UPPER EXTREMITY ROM:   Active ROM Right eval Left eval  Shoulder flexion AROM: 23* PROM: ~120* 35*  Shoulder extension    Shoulder abduction 35* PROM: ~120* 33*  Shoulder adduction    Shoulder internal rotation SI joint* T4*  Shoulder external rotation Mastoid process* C7*  Elbow flexion [facility] WFL  Elbow extension [facility] WFL  Wrist flexion [facility] WFL  Wrist extension [facility] WFL  Wrist ulnar deviation WFL WFL   Wrist radial deviation WFL WFL  Wrist pronation    Wrist supination    (Blank rows = not tested, * = painful)  UPPER EXTREMITY MMT:  MMT Right eval Left eval  Shoulder flexion 2- 2-  Shoulder extension    Shoulder abduction 2- 2-  Shoulder adduction    Shoulder internal rotation 2- 2-  Shoulder external rotation 2- 2-  Middle trapezius    Lower trapezius    Elbow flexion    Elbow extension    Wrist flexion    Wrist extension    Wrist ulnar deviation    Wrist radial deviation    Wrist pronation    Wrist supination    Grip strength (lbs)    (Blank rows = not tested)  JOINT MOBILITY TESTING:  Inferior AMA - firm end feel, no pain, normal motion Posterior AMA - firm end feel, no pain, normal motion  PALPATION:  Not tender to palpation                                                                                                                           TREATMENT DATE:   04/20/23 - EVAL   PATIENT EDUCATION: Education details: POC and HEP Person educated: Patient Education method: Medical illustrator Education comprehension: verbalized understanding  HOME EXERCISE PROGRAM: Access Code: [email or web address] URL: [email or web address] Date: 04/20/2023 Prepared by: Cassie Freer  Exercises - Standing Shoulder Row with Anchored Resistance  - 1 x daily - 7 x weekly - 2 sets - 10 reps - Supine Shoulder Flexion AAROM with Dowel  - 1 x daily - 7 x weekly - 2 sets - 10 reps - Seated Shoulder Flexion Towel Slide at Table Top  - 1 x daily - 7 x weekly -  2 sets - 10 reps - Seated Shoulder Abduction Towel Slide at Table Top  - 1 x daily - 7 x weekly - 2 sets - 10 reps - Standing Bilateral Shoulder Internal Rotation AAROM with Dowel  - 1 x daily - 7 x weekly - 2 sets - 10 reps - Seated Shoulder External Rotation AAROM with Dowel  - 1 x daily - 7 x weekly - 2 sets - 10 reps   ASSESSMENT:  CLINICAL IMPRESSION: Patient is a 33 y.o. female who was seen today for  physical therapy evaluation and treatment for s/p R rTSA. Pt was here for another evaluation after the same shoulder surgery on 09/06/22. She came for a few visits at the end of 2024 initially, but was now able to return after being sick. She has not been keeping up with an HEP over the holidays, so we gave her a new HEP to work through. Pt has very limited bilateral shoulder ROM both against gravity and not against gravity. She was able to get to about 120 degrees of shoulder flexion and abduction with PROM, so her ROM deficits most likely are coming from muscular weakness. Pt will benefit from skilled PT to increase strength and for neuromuscular reeducations of her glenohumeral and periscapular muscles to allow for functional use of both shoulders.    OBJECTIVE IMPAIRMENTS: decreased mobility, decreased ROM, decreased strength, and pain.   ACTIVITY LIMITATIONS: carrying, lifting, sleeping, bathing, toileting, dressing, reach over head, and hygiene/grooming  PARTICIPATION LIMITATIONS: meal prep, cleaning, laundry, driving, community activity, and occupation  PERSONAL FACTORS: Age, Behavior pattern, Education, Fitness, Past/current experiences, Time since onset of injury/illness/exacerbation, and 1 comorbidity: ESRD  are also affecting patient's functional outcome.   REHAB POTENTIAL: Good  CLINICAL DECISION MAKING: Stable/uncomplicated  EVALUATION COMPLEXITY: Low  GOALS: Goals reviewed with patient? Yes  SHORT TERM GOALS: Target date: 06/01/23  Patient will be independent with initial HEP.  Baseline: given 04/20/23 Goal status: INITIAL  2.  Patient will be able to get 90 degrees of shoulder flexion and abduction AROM against gravity. Baseline: see chart above Goal status: INITIAL  3.  Patient will be able to reach T12 (IR) and T1 (ER) with right arm reaching behind the back.  Baseline: SI joint Goal status: INITIAL   LONG TERM GOALS: Target date: 07/13/23  Patient will be independent  with advanced/ongoing HEP to improve outcomes and carryover.  Baseline:  Goal status: INITIAL  2.  Patient will report 75% improvement in right shoulder pain to improve QOL.  Baseline: 3/10 Goal status: INITIAL  3.  Patient to improve bilateral shoulder AROM to [facility] without pain provocation to allow for increased ease of ADLs.  Baseline: see chart above Goal status: INITIAL  4.  Patient will demonstrate improved functional UE strength as demonstrated by 4-/5 MMT. Baseline: grossly 2-/5 Goal status: INITIAL    PLAN:  PT FREQUENCY: 2x/week  PT DURATION: 12 weeks  PLANNED INTERVENTIONS: 97164- PT Re-evaluation, 97110-Therapeutic exercises, 97530- Therapeutic activity, 97112- Neuromuscular re-education, 97535- Self Care, 16109- Manual therapy, 97014- Electrical stimulation (unattended), 97016- Vasopneumatic device, 97035- Ultrasound, Dry Needling, Joint mobilization, Cryotherapy, and Moist heat  PLAN FOR NEXT SESSION: improve shoulder AROM, GH and periscapular muscle strength.    Pascal Lux, Student-PT 04/20/2023, 10:23 AM

## 2023-04-24 ENCOUNTER — Encounter (HOSPITAL_BASED_OUTPATIENT_CLINIC_OR_DEPARTMENT_OTHER): Payer: Self-pay | Admitting: Emergency Medicine

## 2023-04-24 ENCOUNTER — Other Ambulatory Visit: Payer: Self-pay

## 2023-04-24 ENCOUNTER — Emergency Department (HOSPITAL_BASED_OUTPATIENT_CLINIC_OR_DEPARTMENT_OTHER)
Admission: EM | Admit: 2023-04-24 | Discharge: 2023-04-24 | Disposition: A | Discharge status: Home / Self Care | Payer: Medicare Other | Attending: Emergency Medicine | Admitting: Emergency Medicine

## 2023-04-24 DIAGNOSIS — Z7901 Long term (current) use of anticoagulants: Secondary | ICD-10-CM | POA: Insufficient documentation

## 2023-04-24 DIAGNOSIS — Z79899 Other long term (current) drug therapy: Secondary | ICD-10-CM | POA: Insufficient documentation

## 2023-04-24 DIAGNOSIS — T82838A Hemorrhage of vascular prosthetic devices, implants and grafts, initial encounter: Secondary | ICD-10-CM | POA: Diagnosis present

## 2023-04-24 DIAGNOSIS — Y828 Other medical devices associated with adverse incidents: Secondary | ICD-10-CM | POA: Diagnosis not present

## 2023-04-24 DIAGNOSIS — Z7982 Long term (current) use of aspirin: Secondary | ICD-10-CM | POA: Diagnosis not present

## 2023-04-24 HISTORY — DX: Hypothyroidism, unspecified: E03.9

## 2023-04-24 NOTE — ED Provider Notes (Signed)
[place] [facility] DEPARTMENT AT [facility] Provider Note   CSN: [id number] Arrival date & time: 04/24/23  1808     History  Chief Complaint  Patient presents with   Graft Bleeding    Anne Robinson is a 33 y.o. female.  Patient to ED with bleeding from her dialysis graft in the right upper arm. She went to dialysis this morning and bled afterward. The area was bandaged and bleeding controlled. Later this afternoon the area bled again prompting ED evaluation.   The history is provided by the patient. No language interpreter was used.       Home Medications Prior to Admission medications   Medication Sig Start Date End Date Taking? Authorizing Provider  amLODipine (NORVASC) 5 MG tablet Take 5 mg by mouth daily. 09/12/21   [provider]  aspirin 81 MG chewable tablet Chew 81 mg by mouth daily. 07/11/21   [provider]  atorvastatin (LIPITOR) 40 MG tablet Take 40 mg by mouth daily. 07/11/21   [provider]  butalbital-acetaminophen-caffeine (FIORICET) 50-325-40 MG tablet Take 1 tablet by mouth every 6 (six) hours as needed for headache. 09/15/21   Clydie Braun, MD  calcium acetate (PHOSLO) 667 MG capsule Take 667 mg by mouth 3 (three) times daily. 07/22/21   [provider]  cetirizine (ZYRTEC) 10 MG tablet Take 10 mg by mouth daily. 08/14/21   [provider]  dicyclomine (BENTYL) 20 MG tablet Take 1 tablet (20 mg total) by mouth 2 (two) times daily. 05/01/22   Alvira Monday, MD  ELIQUIS 5 MG TABS tablet Take 5 mg by mouth 2 (two) times daily. 07/11/21   [provider]  fluticasone (FLONASE) 50 MCG/ACT nasal spray Place 1 spray into both nostrils daily as needed for allergies.    [provider]  hydrALAZINE (APRESOLINE) 25 MG tablet Take 25 mg by mouth 3 (three) times daily. 09/13/21   [provider]  HYDROcodone-acetaminophen (NORCO/VICODIN) 5-325 MG tablet Take 1-2 tablets by mouth every 6  (six) hours as needed for severe pain. 04/21/22   Pollyann Savoy, MD  ibuprofen (ADVIL) 200 MG tablet Take 800 mg by mouth every 6 (six) hours as needed for headache.    [provider]  levETIRAcetam (KEPPRA) 500 MG tablet Take 500 mg by mouth daily. 08/12/21   [provider]  levothyroxine (SYNTHROID) 50 MCG tablet Take 50 mcg by mouth daily. 09/04/21   [provider]  metoprolol succinate (TOPROL-XL) 50 MG 24 hr tablet Take 50 mg by mouth 2 (two) times daily. 07/11/21   [provider]  ondansetron (ZOFRAN-ODT) 4 MG disintegrating tablet Take 1 tablet (4 mg total) by mouth every 8 (eight) hours as needed for nausea or vomiting. 05/01/22   Alvira Monday, MD  pantoprazole (PROTONIX) 20 MG tablet Take 2 tablets (40 mg total) by mouth daily for 14 days. 05/01/22 05/15/22  Alvira Monday, MD  predniSONE (DELTASONE) 5 MG tablet Take 5 mg by mouth daily with breakfast.    [provider]  prochlorperazine (COMPAZINE) 10 MG tablet Take 1 tablet (10 mg total) by mouth 2 (two) times daily as needed for nausea or vomiting. 05/23/22   Tegeler, Canary Brim, MD  tiZANidine (ZANAFLEX) 4 MG tablet Take 4 mg by mouth at bedtime. 09/13/21   [provider]  traZODone (DESYREL) 50 MG tablet Take 25 mg by mouth at bedtime as needed for sleep. 08/14/21   [provider]  temazepam (RESTORIL)  15 MG capsule Take by mouth. 01/21/19 12/07/19  [provider]      Allergies    Reglan [metoclopramide], Dilaudid [hydromorphone], Ivp dye [iodinated contrast media], Oxycodone, and Zestril [lisinopril]    Review of Systems   Review of Systems  Physical Exam Updated Vital Signs BP (!) 131/100 (BP Location: Left Arm)   Pulse 71   Temp 99.2 F (37.3 C)   Resp 18   Ht 4\' 11"  (1.499 m)   Wt 70.5 kg   SpO2 100%   BMI 31.39 kg/m  Physical Exam Constitutional:      General: She is not in acute distress.    Appearance: She is well-developed. She is  not ill-appearing.  Pulmonary:     Effort: Pulmonary effort is normal.  Musculoskeletal:        General: Normal range of motion.     Cervical back: Normal range of motion.     Comments: Right upper arm dialysis graft with +thrill. No active bleeding. No swelling. No hematoma.   Skin:    General: Skin is warm and dry.  Neurological:     Mental Status: She is alert and oriented to person, place, and time.     ED Results / Procedures / Treatments   Labs (all labs ordered are listed, but only abnormal results are displayed) Labs Reviewed - No data to display  EKG None  Radiology No results found.  Procedures Procedures    Medications Ordered in ED Medications - No data to display  ED Course/ Medical Decision Making/ A&P Clinical Course as of 04/24/23 2025  Mon Apr 24, 2023  2023 Bleeding is controlled. Bandage reapplied. Patient given a Quick-clot to have at home if bleeding recurs. Discussed return precautions.  [SU]    Clinical Course User Index [SU] Elpidio Anis, PA-C                                 Medical Decision Making          Final Clinical Impression(s) / ED Diagnoses Final diagnoses:  Hemorrhage associated with renal dialysis graft [facility])    Rx / DC Orders ED Discharge Orders     None         Elpidio Anis, PA-C 04/24/23 2025    Vanetta Mulders, MD 04/26/23 1108

## 2023-04-24 NOTE — ED Notes (Signed)
Patient left without her discharge papers

## 2023-04-24 NOTE — Discharge Instructions (Signed)
If bleeding recurs, use the Quick-Clot provided to you here. Place the gauze over the area of bleed and then apply a pressure type dressing.   If bleeding remains uncontrolled, return to the ED.

## 2023-04-24 NOTE — ED Triage Notes (Signed)
Pt has dialysis graft to right upper arm.  Pt went to dialysis this am and after her treatment, the staff had a hard time getting the graft to stop bleeding at the unit.  Pt went home and it started to bleed again.  Pt was unable to get it stopped at home.  Pt has dressing in place which has bleed through.  Applied additional dressing in triage.

## 2023-04-24 NOTE — ED Notes (Signed)
Pt. Reports her R upper arm fistula had started to bleed while at home

## 2023-04-27 ENCOUNTER — Ambulatory Visit: Payer: Medicare Other | Admitting: Physical Therapy

## 2023-05-02 ENCOUNTER — Ambulatory Visit: Payer: Medicare Other | Admitting: Physical Therapy

## 2023-05-02 DIAGNOSIS — M25612 Stiffness of left shoulder, not elsewhere classified: Secondary | ICD-10-CM

## 2023-05-02 DIAGNOSIS — M25511 Pain in right shoulder: Secondary | ICD-10-CM

## 2023-05-02 DIAGNOSIS — M25611 Stiffness of right shoulder, not elsewhere classified: Secondary | ICD-10-CM

## 2023-05-02 DIAGNOSIS — M6281 Muscle weakness (generalized): Secondary | ICD-10-CM

## 2023-05-02 DIAGNOSIS — Z96611 Presence of right artificial shoulder joint: Secondary | ICD-10-CM | POA: Diagnosis not present

## 2023-05-02 NOTE — Therapy (Signed)
 OUTPATIENT PHYSICAL THERAPY SHOULDER    Patient Name: Anne Robinson MRN: [id number] DOB:1990-09-22, 33 y.o., female Today's Date: 05/02/2023  END OF SESSION:  PT End of Session - 05/02/23 1144     Visit Number 2    Date for PT Re-Evaluation 07/13/23    Authorization Type [employer]    PT Start Time 1145    PT Stop Time 1230    PT Time Calculation (min) 45 min             Past Medical History:  Diagnosis Date   HTN (hypertension) 05/15/2016   Hypothyroidism    Lupus    Lupus nephritis (HCC)    Pseudotumor cerebri    Renal disorder    Thyroid disease    Past Surgical History:  Procedure Laterality Date   INSERTION OF DIALYSIS CATHETER     RENAL BIOPSY     SHOULDER SURGERY     VENTRICULAR ATRIAL SHUNT Right 11/27/2018   Codman programmable shunt set at 150   Patient Active Problem List   Diagnosis Date Noted   ESRD on dialysis (HCC) 09/15/2021   Idiopathic intracranial hypertension 09/15/2021   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   Anemia of chronic renal failure 09/15/2021   History of DVT (deep vein thrombosis) 09/15/2021   Thrombocytopenia (HCC) 09/15/2021   Hypothyroidism 09/15/2021   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   Complicated migraine 09/14/2021   Cellulitis of chest wall    Acute renal failure (HCC) 01/26/2017   Exacerbation of systemic lupus erythematosus (HCC) 01/26/2017   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 Ninetta Lights  REFERRING PROVIDER: Dr. Fanny Dance  REFERRING DIAG: s/p right rTSA  THERAPY DIAG:  Status post reverse total replacement of right shoulder  Stiffness of right shoulder, not  elsewhere classified  Muscle weakness (generalized)  Stiffness of left shoulder, not elsewhere classified  Acute pain of right shoulder  Rationale for Evaluation and Treatment: Rehabilitation  ONSET DATE: 09/06/22  SUBJECTIVE:                                                                                                                                                                                      SUBJECTIVE STATEMENT:   BIL shld pain and stiffness Left > RT pending new scan and possible injection on left, trying  to avoid more surgery Went for follow up January 29th. Waited for new referral to come back. I have not been doing my exercises and I feel my shoulder are tight and need to be stretched.  Hand dominance: Right  PERTINENT HISTORY: HTN Systemic Lupus Erythematosus ESRD Immunocompromised Hx of DVT Hx of rTSA - 09/19/22, 06/24/22 L AV Fistula revision - 01/24/23  PAIN:  Are you having pain? Yes: NPRS scale: 3/10 Pain location: right shoulder Pain description: sharp sometimes, others achy Aggravating factors: moving into certain positions Relieving factors: laying on her back, sometimes hydrocodone, tylenol, muscle relaxer, topical advil   PRECAUTIONS: Fall and Other: AV Shunt L UE - no BP cuff  RED FLAGS: None   WEIGHT BEARING RESTRICTIONS: No  FALLS:  Has patient fallen in last 6 months? No  LIVING ENVIRONMENT: Lives with: lives with their family Lives in: House/apartment Stairs:  none Has following equipment at home: Single point cane, Environmental consultant - 4 wheeled, and Wheelchair (manual), utilize rollator post dialysis days  OCCUPATION: Nothing rn, previously Occupational psychologist; wants to get into medical billing  PLOF: Independent and Independent with basic ADLs  PATIENT GOALS:improve ROM, be able to lift arms overhead, driving  NEXT MD VISIT:   OBJECTIVE:  Note: Objective measures were completed at Evaluation unless otherwise  noted.  DIAGNOSTIC FINDINGS:  No recent relevant imaging  COGNITION: Overall cognitive status: Within functional limits for tasks assessed     SENSATION: WFL   UPPER EXTREMITY ROM:   Active ROM Right eval Left eval  Shoulder flexion AROM: 23* PROM: ~120* 35*  Shoulder extension    Shoulder abduction 35* PROM: ~120* 33*  Shoulder adduction    Shoulder internal rotation SI joint* T4*  Shoulder external rotation Mastoid process* C7*  Elbow flexion [facility] WFL  Elbow extension [facility] WFL  Wrist flexion [facility] WFL  Wrist extension [facility] WFL  Wrist ulnar deviation WFL WFL  Wrist radial deviation WFL WFL  Wrist pronation    Wrist supination    (Blank rows = not tested, * = painful)  UPPER EXTREMITY MMT:  MMT Right eval Left eval  Shoulder flexion 2- 2-  Shoulder extension    Shoulder abduction 2- 2-  Shoulder adduction    Shoulder internal rotation 2- 2-  Shoulder external rotation 2- 2-  Middle trapezius    Lower trapezius    Elbow flexion    Elbow extension    Wrist flexion    Wrist extension    Wrist ulnar deviation    Wrist radial deviation    Wrist pronation    Wrist supination    Grip strength (lbs)    (Blank rows = not tested)  JOINT MOBILITY TESTING:  Inferior AMA - firm end feel, no pain, normal motion Posterior AMA - firm end feel, no pain, normal motion  PALPATION:  Not tender to palpation  TREATMENT DATE:   05/02/23 PROM BIL UE supine Supine 1# wt chest press,flex,abd,IR and ER 10 x 3# cane ex supine shld flex and chest press 10 x UBE 2 min fwd/2 min backward L2 Nustep L 3 5 min  04/20/23 - EVAL   PATIENT EDUCATION: Education details: POC and HEP Person educated: Patient Education method: Medical illustrator Education comprehension: verbalized understanding  HOME EXERCISE PROGRAM: Access Code: [email or web address] URL:  [email or web address] Date: 04/20/2023 Prepared by: Anne Robinson  Exercises - Standing Shoulder Row with Anchored Resistance  - 1 x daily - 7 x weekly - 2 sets - 10 reps - Supine Shoulder Flexion AAROM with Dowel  - 1 x daily - 7 x weekly - 2 sets - 10 reps - Seated Shoulder Flexion Towel Slide at Table Top  - 1 x daily - 7 x weekly - 2 sets - 10 reps - Seated Shoulder Abduction Towel Slide at Table Top  - 1 x daily - 7 x weekly - 2 sets - 10 reps - Standing Bilateral Shoulder Internal Rotation AAROM with Dowel  - 1 x daily - 7 x weekly - 2 sets - 10 reps - Seated Shoulder External Rotation AAROM with Dowel  - 1 x daily - 7 x weekly - 2 sets - 10 reps   ASSESSMENT:  CLINICAL IMPRESSION: Pt arrives with BIL shld tightness and pain LEft > RT. States trying to avoid more left shld surgery so possibly new scan and inj. Pt with relief after MT and stretching which was note dot be much better than months ago when this PTA worked with this pt. Tolerated ex fair with cuing for speed and control -flexion being the most challenging. OBJECTIVE IMPAIRMENTS: decreased mobility, decreased ROM, decreased strength, and pain.   ACTIVITY LIMITATIONS: carrying, lifting, sleeping, bathing, toileting, dressing, reach over head, and hygiene/grooming  PARTICIPATION LIMITATIONS: meal prep, cleaning, laundry, driving, community activity, and occupation  PERSONAL FACTORS: Age, Behavior pattern, Education, Fitness, Past/current experiences, Time since onset of injury/illness/exacerbation, and 1 comorbidity: ESRD  are also affecting patient's functional outcome.   REHAB POTENTIAL: Good  CLINICAL DECISION MAKING: Stable/uncomplicated  EVALUATION COMPLEXITY: Low  GOALS: Goals reviewed with patient? Yes  SHORT TERM GOALS: Target date: 06/01/23  Patient will be independent with initial HEP.  Baseline: given 04/20/23 Goal status: INITIAL  2.  Patient will be able to get 90 degrees of shoulder  flexion and abduction AROM against gravity. Baseline: see chart above Goal status: INITIAL  3.  Patient will be able to reach T12 (IR) and T1 (ER) with right arm reaching behind the back.  Baseline: SI joint Goal status: INITIAL   LONG TERM GOALS: Target date: 07/13/23  Patient will be independent with advanced/ongoing HEP to improve outcomes and carryover.  Baseline:  Goal status: INITIAL  2.  Patient will report 75% improvement in right shoulder pain to improve QOL.  Baseline: 3/10 Goal status: INITIAL  3.  Patient to improve bilateral shoulder AROM to [facility] without pain provocation to allow for increased ease of ADLs.  Baseline: see chart above Goal status: INITIAL  4.  Patient will demonstrate improved functional UE strength as demonstrated by 4-/5 MMT. Baseline: grossly 2-/5 Goal status: INITIAL    PLAN:  PT FREQUENCY: 2x/week  PT DURATION: 12 weeks  PLANNED INTERVENTIONS: 97164- PT Re-evaluation, 97110-Therapeutic exercises, 97530- Therapeutic activity, 97112- Neuromuscular re-education, 97535- Self Care, 78469- Manual therapy, 97014- Electrical stimulation (unattended), 97016- Vasopneumatic device, 97035- Ultrasound, Dry Needling, Joint mobilization, Cryotherapy, and Moist  heat  PLAN FOR NEXT SESSION: improve shoulder AROM, GH and periscapular muscle strength. Recheck HEP as pt is non compliant   Anne Robinson,ANGIE, PTA 05/02/2023, 11:44 AM [place] [facility] at [facility] [address] [facility] [place], [place], [address] Phone: [phone]   Fax:  [phone]  Patient Details  Name: YAKIRA DUQUETTE MRN: [id number] Date of Birth: [birth year] Referring Provider:  Laqueta Due., MD  Encounter Date: 05/02/2023   Anne Robinson, PTA 05/02/2023, 11:44 AM  [facility] [facility] at [facility] [address]. [place], [place], [address] Phone: [phone]   Fax:  [phone]

## 2023-05-04 ENCOUNTER — Ambulatory Visit: Payer: Medicare Other | Admitting: Physical Therapy

## 2023-05-04 ENCOUNTER — Encounter: Payer: Self-pay | Admitting: Physical Therapy

## 2023-05-04 DIAGNOSIS — Z96611 Presence of right artificial shoulder joint: Secondary | ICD-10-CM

## 2023-05-04 DIAGNOSIS — M25611 Stiffness of right shoulder, not elsewhere classified: Secondary | ICD-10-CM

## 2023-05-04 DIAGNOSIS — M25612 Stiffness of left shoulder, not elsewhere classified: Secondary | ICD-10-CM

## 2023-05-04 DIAGNOSIS — M6281 Muscle weakness (generalized): Secondary | ICD-10-CM

## 2023-05-04 NOTE — Therapy (Signed)
 OUTPATIENT PHYSICAL THERAPY SHOULDER    Patient Name: Anne Robinson MRN: [id number] DOB:1990/11/25, 33 y.o., female Today's Date: 05/04/2023  END OF SESSION:  PT End of Session - 05/04/23 1348     Visit Number 3    Date for PT Re-Evaluation 07/13/23    PT Start Time 1345    PT Stop Time 1430    PT Time Calculation (min) 45 min    Activity Tolerance Patient tolerated treatment well    Behavior During Therapy [facility] for tasks assessed/performed             Past Medical History:  Diagnosis Date   HTN (hypertension) 05/15/2016   Hypothyroidism    Lupus    Lupus nephritis (HCC)    Pseudotumor cerebri    Renal disorder    Thyroid disease    Past Surgical History:  Procedure Laterality Date   INSERTION OF DIALYSIS CATHETER     RENAL BIOPSY     SHOULDER SURGERY     VENTRICULAR ATRIAL SHUNT Right 11/27/2018   Codman programmable shunt set at 150   Patient Active Problem List   Diagnosis Date Noted   ESRD on dialysis (HCC) 09/15/2021   Idiopathic intracranial hypertension 09/15/2021   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   Anemia of chronic renal failure 09/15/2021   History of DVT (deep vein thrombosis) 09/15/2021   Thrombocytopenia (HCC) 09/15/2021   Hypothyroidism 09/15/2021   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   Complicated migraine 09/14/2021   Cellulitis of chest wall    Acute renal failure (HCC) 01/26/2017   Exacerbation of systemic lupus erythematosus (HCC) 01/26/2017   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 Ninetta Lights  REFERRING PROVIDER: Dr. Fanny Dance  REFERRING DIAG: s/p right rTSA  THERAPY DIAG:  Status post reverse  total replacement of right shoulder  Stiffness of right shoulder, not elsewhere classified  Muscle weakness (generalized)  Stiffness of left shoulder, not elsewhere classified  Rationale for Evaluation and Treatment: Rehabilitation  ONSET DATE: 09/06/22  SUBJECTIVE:                                                                                                                                                                                      SUBJECTIVE STATEMENT:  Still tight but a little looses Hand dominance: Right  PERTINENT HISTORY:  HTN Systemic Lupus Erythematosus ESRD Immunocompromised Hx of DVT Hx of rTSA - 09/19/22, 06/24/22 L AV Fistula revision - 01/24/23  PAIN:  Are you having pain? Yes: NPRS scale: 5/10 Pain location: Left  shoulder Pain description: sharp sometimes, others achy Aggravating factors: moving into certain positions Relieving factors: laying on her back, sometimes hydrocodone, tylenol, muscle relaxer, topical advil   PRECAUTIONS: Fall and Other: AV Shunt L UE - no BP cuff  RED FLAGS: None   WEIGHT BEARING RESTRICTIONS: No  FALLS:  Has patient fallen in last 6 months? No  LIVING ENVIRONMENT: Lives with: lives with their family Lives in: House/apartment Stairs:  none Has following equipment at home: Single point cane, Environmental consultant - 4 wheeled, and Wheelchair (manual), utilize rollator post dialysis days  OCCUPATION: Nothing rn, previously Occupational psychologist; wants to get into medical billing  PLOF: Independent and Independent with basic ADLs  PATIENT GOALS:improve ROM, be able to lift arms overhead, driving  NEXT MD VISIT:   OBJECTIVE:  Note: Objective measures were completed at Evaluation unless otherwise noted.  DIAGNOSTIC FINDINGS:  No recent relevant imaging  COGNITION: Overall cognitive status: Within functional limits for tasks assessed     SENSATION: WFL   UPPER EXTREMITY ROM:   Active ROM Right eval  Left eval  Shoulder flexion AROM: 23* PROM: ~120* 35*  Shoulder extension    Shoulder abduction 35* PROM: ~120* 33*  Shoulder adduction    Shoulder internal rotation SI joint* T4*  Shoulder external rotation Mastoid process* C7*  Elbow flexion [facility] WFL  Elbow extension [facility] WFL  Wrist flexion [facility] WFL  Wrist extension [facility] WFL  Wrist ulnar deviation WFL WFL  Wrist radial deviation WFL WFL  Wrist pronation    Wrist supination    (Blank rows = not tested, * = painful)  UPPER EXTREMITY MMT:  MMT Right eval Left eval  Shoulder flexion 2- 2-  Shoulder extension    Shoulder abduction 2- 2-  Shoulder adduction    Shoulder internal rotation 2- 2-  Shoulder external rotation 2- 2-  Middle trapezius    Lower trapezius    Elbow flexion    Elbow extension    Wrist flexion    Wrist extension    Wrist ulnar deviation    Wrist radial deviation    Wrist pronation    Wrist supination    Grip strength (lbs)    (Blank rows = not tested)  JOINT MOBILITY TESTING:  Inferior AMA - firm end feel, no pain, normal motion Posterior AMA - firm end feel, no pain, normal motion  PALPATION:  Not tender to palpation                                                                                                                           TREATMENT DATE:  05/04/23 NuStep L 3 x 5 min 1lb cane Flex, Ext,  x10 PROM BIL UE supine with end range holds  AAROM supine flex &  Chest press 3lb WaTE 2x10  RUE supine 1lb ER 2x10, Flex 2x5, IR 2x10 RUE supine shoulder abd 2x10 Seated Triceps Ext red 2x15  05/02/23 PROM BIL UE supine Supine 1# wt chest press,flex,abd,IR and ER 10 x 3# cane ex supine shld flex and chest press 10 x UBE 2 min fwd/2 min backward L2 Nustep L 3 5 min  04/20/23 - EVAL   PATIENT EDUCATION: Education details: POC and HEP Person educated: Patient Education method: Medical illustrator Education comprehension: verbalized understanding  HOME EXERCISE  PROGRAM: Access Code: [email or web address] URL: [email or web address] Date: 04/20/2023 Prepared by: Cassie Freer  Exercises - Standing Shoulder Row with Anchored Resistance  - 1 x daily - 7 x weekly - 2 sets - 10 reps - Supine Shoulder Flexion AAROM with Dowel  - 1 x daily - 7 x weekly - 2 sets - 10 reps - Seated Shoulder Flexion Towel Slide at Table Top  - 1 x daily - 7 x weekly - 2 sets - 10 reps - Seated Shoulder Abduction Towel Slide at Table Top  - 1 x daily - 7 x weekly - 2 sets - 10 reps - Standing Bilateral Shoulder Internal Rotation AAROM with Dowel  - 1 x daily - 7 x weekly - 2 sets - 10 reps - Seated Shoulder External Rotation AAROM with Dowel  - 1 x daily - 7 x weekly - 2 sets - 10 reps   ASSESSMENT:  CLINICAL IMPRESSION: Pt arrives with BIL shld tightness and pain LEft > RT. Again trying to avoid more left shld surgery so possibly new scan and inj. Tolerated ex fair with cuing control flexion being the most challenging. RUE ER is the most limiting. Tolerated all PROM well but with some end range pain.  OBJECTIVE IMPAIRMENTS: decreased mobility, decreased ROM, decreased strength, and pain.   ACTIVITY LIMITATIONS: carrying, lifting, sleeping, bathing, toileting, dressing, reach over head, and hygiene/grooming  PARTICIPATION LIMITATIONS: meal prep, cleaning, laundry, driving, community activity, and occupation  PERSONAL FACTORS: Age, Behavior pattern, Education, Fitness, Past/current experiences, Time since onset of injury/illness/exacerbation, and 1 comorbidity: ESRD  are also affecting patient's functional outcome.   REHAB POTENTIAL: Good  CLINICAL DECISION MAKING: Stable/uncomplicated  EVALUATION COMPLEXITY: Low  GOALS: Goals reviewed with patient? Yes  SHORT TERM GOALS: Target date: 06/01/23  Patient will be independent with initial HEP.  Baseline: given 04/20/23 Goal status: INITIAL  2.  Patient will be able to get 90 degrees of shoulder flexion and abduction  AROM against gravity. Baseline: see chart above Goal status: INITIAL  3.  Patient will be able to reach T12 (IR) and T1 (ER) with right arm reaching behind the back.  Baseline: SI joint Goal status: INITIAL   LONG TERM GOALS: Target date: 07/13/23  Patient will be independent with advanced/ongoing HEP to improve outcomes and carryover.  Baseline:  Goal status: INITIAL  2.  Patient will report 75% improvement in right shoulder pain to improve QOL.  Baseline: 3/10 Goal status: INITIAL  3.  Patient to improve bilateral shoulder AROM to [facility] without pain provocation to allow for increased ease of ADLs.  Baseline: see chart above Goal status: INITIAL  4.  Patient will demonstrate improved functional UE strength as demonstrated by 4-/5 MMT. Baseline: grossly 2-/5 Goal status: INITIAL    PLAN:  PT FREQUENCY: 2x/week  PT DURATION: 12 weeks  PLANNED INTERVENTIONS: 97164- PT Re-evaluation, 97110-Therapeutic exercises, 97530- Therapeutic activity, 97112- Neuromuscular re-education, 97535- Self Care, [address]- Manual therapy, 97014- Electrical stimulation (unattended),  78295- Vasopneumatic device, [id number]- Ultrasound, Dry Needling, Joint mobilization, Cryotherapy, and Moist heat  PLAN FOR NEXT SESSION: improve shoulder AROM, GH and periscapular muscle strength. Recheck HEP as pt is non compliant   Grayce Sessions, PTA

## 2023-05-09 ENCOUNTER — Encounter: Payer: Self-pay | Admitting: Physical Therapy

## 2023-05-09 ENCOUNTER — Ambulatory Visit: Payer: Medicare Other | Attending: Internal Medicine | Admitting: Physical Therapy

## 2023-05-09 DIAGNOSIS — M6281 Muscle weakness (generalized): Secondary | ICD-10-CM | POA: Diagnosis present

## 2023-05-09 DIAGNOSIS — M25612 Stiffness of left shoulder, not elsewhere classified: Secondary | ICD-10-CM | POA: Diagnosis present

## 2023-05-09 DIAGNOSIS — Z96611 Presence of right artificial shoulder joint: Secondary | ICD-10-CM | POA: Insufficient documentation

## 2023-05-09 DIAGNOSIS — M25511 Pain in right shoulder: Secondary | ICD-10-CM | POA: Diagnosis present

## 2023-05-09 DIAGNOSIS — M25611 Stiffness of right shoulder, not elsewhere classified: Secondary | ICD-10-CM | POA: Insufficient documentation

## 2023-05-09 NOTE — Therapy (Signed)
 OUTPATIENT PHYSICAL THERAPY SHOULDER    Patient Name: Anne Robinson MRN: [id number] DOB:May 15, 1990, 33 y.o., female Today's Date: 05/09/2023  END OF SESSION:  PT End of Session - 05/09/23 1302     Visit Number 4    Date for PT Re-Evaluation 07/13/23    PT Start Time 1303    PT Stop Time 1345    PT Time Calculation (min) 42 min    Activity Tolerance Patient tolerated treatment well    Behavior During Therapy [facility] for tasks assessed/performed             Past Medical History:  Diagnosis Date   HTN (hypertension) 05/15/2016   Hypothyroidism    Lupus    Lupus nephritis (HCC)    Pseudotumor cerebri    Renal disorder    Thyroid disease    Past Surgical History:  Procedure Laterality Date   INSERTION OF DIALYSIS CATHETER     RENAL BIOPSY     SHOULDER SURGERY     VENTRICULAR ATRIAL SHUNT Right 11/27/2018   Codman programmable shunt set at 150   Patient Active Problem List   Diagnosis Date Noted   ESRD on dialysis (HCC) 09/15/2021   Idiopathic intracranial hypertension 09/15/2021   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   Anemia of chronic renal failure 09/15/2021   History of DVT (deep vein thrombosis) 09/15/2021   Thrombocytopenia (HCC) 09/15/2021   Hypothyroidism 09/15/2021   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   Complicated migraine 09/14/2021   Cellulitis of chest wall    Acute renal failure (HCC) 01/26/2017   Exacerbation of systemic lupus erythematosus (HCC) 01/26/2017   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 Ninetta Lights  REFERRING PROVIDER: Dr. Fanny Dance  REFERRING DIAG: s/p right rTSA  THERAPY DIAG:  Status post reverse  total replacement of right shoulder  Stiffness of right shoulder, not elsewhere classified  Muscle weakness (generalized)  Stiffness of left shoulder, not elsewhere classified  Rationale for Evaluation and Treatment: Rehabilitation  ONSET DATE: 09/06/22  SUBJECTIVE:                                                                                                                                                                                      SUBJECTIVE STATEMENT:  "Ok but, its always some level of pain or discomfort"  Hand  dominance: Right  PERTINENT HISTORY: HTN Systemic Lupus Erythematosus ESRD Immunocompromised Hx of DVT Hx of rTSA - 09/19/22, 06/24/22 L AV Fistula revision - 01/24/23  PAIN:  Are you having pain? Yes: NPRS scale: 3/10 Pain location: both  shoulder Pain description: sharp sometimes, others achy Aggravating factors: moving into certain positions Relieving factors: laying on her back, sometimes hydrocodone, tylenol, muscle relaxer, topical advil   PRECAUTIONS: Fall and Other: AV Shunt L UE - no BP cuff  RED FLAGS: None   WEIGHT BEARING RESTRICTIONS: No  FALLS:  Has patient fallen in last 6 months? No  LIVING ENVIRONMENT: Lives with: lives with their family Lives in: House/apartment Stairs:  none Has following equipment at home: Single point cane, Environmental consultant - 4 wheeled, and Wheelchair (manual), utilize rollator post dialysis days  OCCUPATION: Nothing rn, previously Occupational psychologist; wants to get into medical billing  PLOF: Independent and Independent with basic ADLs  PATIENT GOALS:improve ROM, be able to lift arms overhead, driving  NEXT MD VISIT:   OBJECTIVE:  Note: Objective measures were completed at Evaluation unless otherwise noted.  DIAGNOSTIC FINDINGS:  No recent relevant imaging  COGNITION: Overall cognitive status: Within functional limits for tasks assessed     SENSATION: WFL   UPPER EXTREMITY ROM:   Active  ROM Right eval Left eval  Shoulder flexion AROM: 23* PROM: ~120* 35*  Shoulder extension    Shoulder abduction 35* PROM: ~120* 33*  Shoulder adduction    Shoulder internal rotation SI joint* T4*  Shoulder external rotation Mastoid process* C7*  Elbow flexion [facility] WFL  Elbow extension [facility] WFL  Wrist flexion [facility] WFL  Wrist extension [facility] WFL  Wrist ulnar deviation WFL WFL  Wrist radial deviation WFL WFL  Wrist pronation    Wrist supination    (Blank rows = not tested, * = painful)  UPPER EXTREMITY MMT:  MMT Right eval Left eval  Shoulder flexion 2- 2-  Shoulder extension    Shoulder abduction 2- 2-  Shoulder adduction    Shoulder internal rotation 2- 2-  Shoulder external rotation 2- 2-  Middle trapezius    Lower trapezius    Elbow flexion    Elbow extension    Wrist flexion    Wrist extension    Wrist ulnar deviation    Wrist radial deviation    Wrist pronation    Wrist supination    Grip strength (lbs)    (Blank rows = not tested)  JOINT MOBILITY TESTING:  Inferior AMA - firm end feel, no pain, normal motion Posterior AMA - firm end feel, no pain, normal motion  PALPATION:  Not tender to palpation                                                                                                                           TREATMENT DATE:  05/09/23 NuStep L 3 x 4 min UBE L1 x 2 min each AAROM standing flex, Ext, IR up back  dowel 2x10 Rows & Ext yellow 2x10 RUE supine 1lb ER 2x10, Flex 3x5, IR 2x10 AAROM supine flex & Chest press 3lb WaTE 2x10  PROM BIL UE supine with end range holds  Supine horiz abd yellow 2x10  05/04/23 NuStep L 3 x 5 min 1lb cane Flex, Ext,  x10 PROM BIL UE supine with end range holds  AAROM supine flex & Chest press 3lb WaTE 2x10  RUE supine 1lb ER 2x10, Flex 2x5, IR 2x10 RUE supine shoulder abd 2x10 Seated Triceps Ext red 2x15  05/02/23 PROM BIL UE supine Supine 1# wt chest press,flex,abd,IR and ER 10 x 3# cane ex supine shld flex  and chest press 10 x UBE 2 min fwd/2 min backward L2 Nustep L 3 5 min  04/20/23 - EVAL   PATIENT EDUCATION: Education details: POC and HEP Person educated: Patient Education method: Medical illustrator Education comprehension: verbalized understanding  HOME EXERCISE PROGRAM: Access Code: [email or web address] URL: [email or web address] Date: 04/20/2023 Prepared by: Cassie Freer  Exercises - Standing Shoulder Row with Anchored Resistance  - 1 x daily - 7 x weekly - 2 sets - 10 reps - Supine Shoulder Flexion AAROM with Dowel  - 1 x daily - 7 x weekly - 2 sets - 10 reps - Seated Shoulder Flexion Towel Slide at Table Top  - 1 x daily - 7 x weekly - 2 sets - 10 reps - Seated Shoulder Abduction Towel Slide at Table Top  - 1 x daily - 7 x weekly - 2 sets - 10 reps - Standing Bilateral Shoulder Internal Rotation AAROM with Dowel  - 1 x daily - 7 x weekly - 2 sets - 10 reps - Seated Shoulder External Rotation AAROM with Dowel  - 1 x daily - 7 x weekly - 2 sets - 10 reps   ASSESSMENT:  CLINICAL IMPRESSION: Pt arrives with BIL shld tightness and pain in both shoulders Left > RT.  She is currently waiting on the MD, trying to avoid more left shld surgery so possibly new scan and inj. Tolerated ex fair with cuing for posture. RUE ER remains the most limiting. Tolerated all PROM well but with some end range pain. Added more strengthening interventions without issues.  OBJECTIVE IMPAIRMENTS: decreased mobility, decreased ROM, decreased strength, and pain.   ACTIVITY LIMITATIONS: carrying, lifting, sleeping, bathing, toileting, dressing, reach over head, and hygiene/grooming  PARTICIPATION LIMITATIONS: meal prep, cleaning, laundry, driving, community activity, and occupation  PERSONAL FACTORS: Age, Behavior pattern, Education, Fitness, Past/current experiences, Time since onset of injury/illness/exacerbation, and 1 comorbidity: ESRD  are also affecting patient's functional outcome.    REHAB POTENTIAL: Good  CLINICAL DECISION MAKING: Stable/uncomplicated  EVALUATION COMPLEXITY: Low  GOALS: Goals reviewed with patient? Yes  SHORT TERM GOALS: Target date: 06/01/23  Patient will be independent with initial HEP.  Baseline: given 04/20/23 Goal status: Progressing   2.  Patient will be able to get 90 degrees of shoulder flexion and abduction AROM against gravity. Baseline: see chart above Goal status: INITIAL  3.  Patient will be able to reach T12 (IR) and T1 (ER) with right arm reaching behind the back.  Baseline: SI joint Goal status: INITIAL   LONG TERM GOALS: Target date: 07/13/23  Patient will be independent with advanced/ongoing HEP to improve outcomes and carryover.  Baseline:  Goal status: INITIAL  2.  Patient will report 75% improvement in right shoulder pain to improve QOL.  Baseline: 3/10 Goal status: INITIAL  3.  Patient to improve bilateral  shoulder AROM to [facility] without pain provocation to allow for increased ease of ADLs.  Baseline: see chart above Goal status: INITIAL  4.  Patient will demonstrate improved functional UE strength as demonstrated by 4-/5 MMT. Baseline: grossly 2-/5 Goal status: INITIAL    PLAN:  PT FREQUENCY: 2x/week  PT DURATION: 12 weeks  PLANNED INTERVENTIONS: 97164- PT Re-evaluation, 97110-Therapeutic exercises, 97530- Therapeutic activity, 97112- Neuromuscular re-education, 97535- Self Care, 16109- Manual therapy, 97014- Electrical stimulation (unattended), 97016- Vasopneumatic device, 97035- Ultrasound, Dry Needling, Joint mobilization, Cryotherapy, and Moist heat  PLAN FOR NEXT SESSION: improve shoulder AROM, GH and periscapular muscle strength. Recheck HEP as pt is non compliant   Grayce Sessions, PTA

## 2023-05-16 ENCOUNTER — Ambulatory Visit: Payer: Medicare Other | Admitting: Physical Therapy

## 2023-05-16 DIAGNOSIS — Z96611 Presence of right artificial shoulder joint: Secondary | ICD-10-CM | POA: Diagnosis not present

## 2023-05-16 DIAGNOSIS — M25611 Stiffness of right shoulder, not elsewhere classified: Secondary | ICD-10-CM

## 2023-05-16 DIAGNOSIS — M25511 Pain in right shoulder: Secondary | ICD-10-CM

## 2023-05-16 DIAGNOSIS — M25612 Stiffness of left shoulder, not elsewhere classified: Secondary | ICD-10-CM

## 2023-05-16 DIAGNOSIS — M6281 Muscle weakness (generalized): Secondary | ICD-10-CM

## 2023-05-16 NOTE — Therapy (Signed)
 OUTPATIENT PHYSICAL THERAPY SHOULDER    Patient Name: Anne Robinson MRN: [id number] DOB:March 24, 1990, 33 y.o., female Today's Date: 05/16/2023  END OF SESSION:  PT End of Session - 05/16/23 1356     Visit Number 5    Date for PT Re-Evaluation 07/13/23    Authorization Type [employer]    PT Start Time 1357    PT Stop Time 1445    PT Time Calculation (min) 48 min             Past Medical History:  Diagnosis Date   HTN (hypertension) 05/15/2016   Hypothyroidism    Lupus    Lupus nephritis (HCC)    Pseudotumor cerebri    Renal disorder    Thyroid disease    Past Surgical History:  Procedure Laterality Date   INSERTION OF DIALYSIS CATHETER     RENAL BIOPSY     SHOULDER SURGERY     VENTRICULAR ATRIAL SHUNT Right 11/27/2018   Codman programmable shunt set at 150   Patient Active Problem List   Diagnosis Date Noted   ESRD on dialysis (HCC) 09/15/2021   Idiopathic intracranial hypertension 09/15/2021   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   Anemia of chronic renal failure 09/15/2021   History of DVT (deep vein thrombosis) 09/15/2021   Thrombocytopenia (HCC) 09/15/2021   Hypothyroidism 09/15/2021   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   Complicated migraine 09/14/2021   Cellulitis of chest wall    Acute renal failure (HCC) 01/26/2017   Exacerbation of systemic lupus erythematosus (HCC) 01/26/2017   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 Ninetta Lights  REFERRING PROVIDER: Dr. Fanny Dance  REFERRING DIAG: s/p right rTSA  THERAPY DIAG:  Status post reverse total replacement of right shoulder  Stiffness of right shoulder, not  elsewhere classified  Muscle weakness (generalized)  Stiffness of left shoulder, not elsewhere classified  Acute pain of right shoulder  Rationale for Evaluation and Treatment: Rehabilitation  ONSET DATE: 09/06/22  SUBJECTIVE:                                                                                                                                                                                      SUBJECTIVE STATEMENT:  Left shld giving me a fit- MRI has been approved    Hand dominance: Right  PERTINENT  HISTORY: HTN Systemic Lupus Erythematosus ESRD Immunocompromised Hx of DVT Hx of rTSA - 09/19/22, 06/24/22 L AV Fistula revision - 01/24/23  PAIN:  Are you having pain? Yes: NPRS scale: 3/10 Pain location: both  shoulder Pain description: sharp sometimes, others achy Aggravating factors: moving into certain positions Relieving factors: laying on her back, sometimes hydrocodone, tylenol, muscle relaxer, topical advil   PRECAUTIONS: Fall and Other: AV Shunt L UE - no BP cuff  RED FLAGS: None   WEIGHT BEARING RESTRICTIONS: No  FALLS:  Has patient fallen in last 6 months? No  LIVING ENVIRONMENT: Lives with: lives with their family Lives in: House/apartment Stairs:  none Has following equipment at home: Single point cane, Environmental consultant - 4 wheeled, and Wheelchair (manual), utilize rollator post dialysis days  OCCUPATION: Nothing rn, previously Occupational psychologist; wants to get into medical billing  PLOF: Independent and Independent with basic ADLs  PATIENT GOALS:improve ROM, be able to lift arms overhead, driving  NEXT MD VISIT:   OBJECTIVE:  Note: Objective measures were completed at Evaluation unless otherwise noted.  DIAGNOSTIC FINDINGS:  No recent relevant imaging  COGNITION: Overall cognitive status: Within functional limits for tasks assessed     SENSATION: [facility]   UPPER EXTREMITY ROM:   Active ROM Right eval Left eval  RT Seated 05/16/23 Act/Pass  Shoulder flexion AROM: 23* PROM: ~120* 35* 60/160  Shoulder extension     Shoulder abduction 35* PROM: ~120* 33* 60/163   Shoulder adduction     Shoulder internal rotation SI joint* T4*   Shoulder external rotation Mastoid process* C7*   Elbow flexion [facility] WFL   Elbow extension [facility]   Wrist flexion [facility] WFL   Wrist extension [facility] WFL   Wrist ulnar deviation WFL WFL   Wrist radial deviation WFL WFL   Wrist pronation     Wrist supination     (Blank rows = not tested, * = painful)  UPPER EXTREMITY MMT:  MMT Right eval Left eval RT 05/16/23  Shoulder flexion 2- 2- 2+  Shoulder extension     Shoulder abduction 2- 2- 2+  Shoulder adduction     Shoulder internal rotation 2- 2- 3-  Shoulder external rotation 2- 2- 3-  Middle trapezius     Lower trapezius     Elbow flexion     Elbow extension     Wrist flexion     Wrist extension     Wrist ulnar deviation     Wrist radial deviation     Wrist pronation     Wrist supination     Grip strength (lbs)     (Blank rows = not tested)  JOINT MOBILITY TESTING:  Inferior AMA - firm end feel, no pain, normal motion Posterior AMA - firm end feel, no pain, normal motion  PALPATION:  Not tender to palpation  TREATMENT DATE:   05/16/23 Nustep L 4 UBE L 2 2 min fwd/back ROM/MMT and goals checked Seated Row 15# 2 sets 10 Lat Pull 15# 2 sets 10 with PTA assistance Chest press 5# 10x - painful BIL L>RT 2# rotation behind back Bicep curl 3# 2 sets 10 ( palm up, thumb up) Seated 2# cane ex 10 x shld flex,chest press,abd Supine 2# cane ex 10 x chest press,flex,abd PROM BIL shlds   05/09/23 NuStep L 3 x 4 min UBE L1 x 2 min each AAROM standing flex, Ext, IR up back dowel 2x10 Rows & Ext yellow 2x10 RUE supine 1lb ER 2x10, Flex 3x5, IR 2x10 AAROM supine flex & Chest press 3lb  WaTE 2x10  PROM BIL UE supine with end range holds  Supine horiz abd yellow 2x10  05/04/23 NuStep L 3 x 5 min 1lb cane Flex, Ext,  x10 PROM BIL UE supine with end range holds  AAROM supine flex & Chest press 3lb WaTE 2x10  RUE supine 1lb ER 2x10, Flex 2x5, IR 2x10 RUE supine shoulder abd 2x10 Seated Triceps Ext red 2x15  05/02/23 PROM BIL UE supine Supine 1# wt chest press,flex,abd,IR and ER 10 x 3# cane ex supine shld flex and chest press 10 x UBE 2 min fwd/2 min backward L2 Nustep L 3 5 min  04/20/23 - EVAL   PATIENT EDUCATION: Education details: POC and HEP Person educated: Patient Education method: Medical illustrator Education comprehension: verbalized understanding  HOME EXERCISE PROGRAM: Access Code: [email or web address] URL: [email or web address] Date: 04/20/2023 Prepared by: Cassie Freer  Exercises - Standing Shoulder Row with Anchored Resistance  - 1 x daily - 7 x weekly - 2 sets - 10 reps - Supine Shoulder Flexion AAROM with Dowel  - 1 x daily - 7 x weekly - 2 sets - 10 reps - Seated Shoulder Flexion Towel Slide at Table Top  - 1 x daily - 7 x weekly - 2 sets - 10 reps - Seated Shoulder Abduction Towel Slide at Table Top  - 1 x daily - 7 x weekly - 2 sets - 10 reps - Standing Bilateral Shoulder Internal Rotation AAROM with Dowel  - 1 x daily - 7 x weekly - 2 sets - 10 reps - Seated Shoulder External Rotation AAROM with Dowel  - 1 x daily - 7 x weekly - 2 sets - 10 reps   ASSESSMENT:  CLINICAL IMPRESSION: Pt arrives with shld pain L>RT,MRI approved just waiting to get scheduled. ROM/MMT and goals assessed and documented. PROM significantly more than Active d/t weakness. Progressed strength with verb and tactile cuing,very weak so many compensations and pain with left shld  OBJECTIVE IMPAIRMENTS: decreased mobility, decreased ROM, decreased strength, and pain.   ACTIVITY LIMITATIONS: carrying, lifting, sleeping, bathing, toileting, dressing, reach  over head, and hygiene/grooming  PARTICIPATION LIMITATIONS: meal prep, cleaning, laundry, driving, community activity, and occupation  PERSONAL FACTORS: Age, Behavior pattern, Education, Fitness, Past/current experiences, Time since onset of injury/illness/exacerbation, and 1 comorbidity: ESRD  are also affecting patient's functional outcome.   REHAB POTENTIAL: Good  CLINICAL DECISION MAKING: Stable/uncomplicated  EVALUATION COMPLEXITY: Low  GOALS: Goals reviewed with patient? Yes  SHORT TERM GOALS: Target date: 06/01/23  Patient will be independent with initial HEP.  Baseline: given 04/20/23 Goal status: Progressing  MET 05/16/23  2.  Patient will be able to get 90 degrees of shoulder flexion and abduction AROM against gravity. Baseline: see chart above Goal status: progressing 05/16/23  3.  Patient  will be able to reach T12 (IR) and T1 (ER) with right arm reaching behind the back.  Baseline: SI joint Goal status: progressing 05/16/23   LONG TERM GOALS: Target date: 07/13/23  Patient will be independent with advanced/ongoing HEP to improve outcomes and carryover.  Baseline:  Goal status: INITIAL  2.  Patient will report 75% improvement in right shoulder pain to improve QOL.  Baseline: 3/10 Goal status: INITIAL  3.  Patient to improve bilateral shoulder AROM to [facility] without pain provocation to allow for increased ease of ADLs.  Baseline: see chart above Goal status: INITIAL  4.  Patient will demonstrate improved functional UE strength as demonstrated by 4-/5 MMT. Baseline: grossly 2-/5 Goal status: INITIAL    PLAN:  PT FREQUENCY: 2x/week  PT DURATION: 12 weeks  PLANNED INTERVENTIONS: 97164- PT Re-evaluation, 97110-Therapeutic exercises, 97530- Therapeutic activity, 97112- Neuromuscular re-education, 97535- Self Care, 16109- Manual therapy, 97014- Electrical stimulation (unattended), 97016- Vasopneumatic device, 97035- Ultrasound, Dry Needling, Joint mobilization,  Cryotherapy, and Moist heat  PLAN FOR NEXT SESSION: improve shoulder AROM, GH and periscapular muscle strength. Recheck HEP as pt is non compliant   Aizlyn Schifano,ANGIE, PTA  [facility] at [facility] [address] [place], [place], [address] Phone: [phone]   Fax:  [phone]  Patient Details  Name: Anne Robinson MRN: [id number] Date of Birth: 1990/08/08 Referring Provider:  Laqueta Due., MD  Encounter Date: 05/16/2023   Suanne Marker, PTA 05/16/2023, 1:57 PM  [facility] [facility] at [facility] [address] [place], [place], [address] Phone: [phone]   Fax:  [phone]

## 2023-05-18 ENCOUNTER — Ambulatory Visit: Payer: Medicare Other | Admitting: Physical Therapy

## 2023-05-18 DIAGNOSIS — M25612 Stiffness of left shoulder, not elsewhere classified: Secondary | ICD-10-CM

## 2023-05-18 DIAGNOSIS — Z96611 Presence of right artificial shoulder joint: Secondary | ICD-10-CM | POA: Diagnosis not present

## 2023-05-18 DIAGNOSIS — M25611 Stiffness of right shoulder, not elsewhere classified: Secondary | ICD-10-CM

## 2023-05-18 DIAGNOSIS — M6281 Muscle weakness (generalized): Secondary | ICD-10-CM

## 2023-05-18 NOTE — Therapy (Signed)
 OUTPATIENT PHYSICAL THERAPY SHOULDER    Patient Name: Anne Robinson MRN: [id number] DOB:01/04/1991, 33 y.o., female Today's Date: 05/18/2023  END OF SESSION:  PT End of Session - 05/18/23 1405     Visit Number 6    Date for PT Re-Evaluation 07/13/23    Authorization Type [employer]    PT Start Time 1402    PT Stop Time 1445    PT Time Calculation (min) 43 min             Past Medical History:  Diagnosis Date   HTN (hypertension) 05/15/2016   Hypothyroidism    Lupus    Lupus nephritis (HCC)    Pseudotumor cerebri    Renal disorder    Thyroid disease    Past Surgical History:  Procedure Laterality Date   INSERTION OF DIALYSIS CATHETER     RENAL BIOPSY     SHOULDER SURGERY     VENTRICULAR ATRIAL SHUNT Right 11/27/2018   Codman programmable shunt set at 150   Patient Active Problem List   Diagnosis Date Noted   ESRD on dialysis (HCC) 09/15/2021   Idiopathic intracranial hypertension 09/15/2021   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   Anemia of chronic renal failure 09/15/2021   History of DVT (deep vein thrombosis) 09/15/2021   Thrombocytopenia (HCC) 09/15/2021   Hypothyroidism 09/15/2021   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   Complicated migraine 09/14/2021   Cellulitis of chest wall    Acute renal failure (HCC) 01/26/2017   Exacerbation of systemic lupus erythematosus (HCC) 01/26/2017   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 Ninetta Lights  REFERRING PROVIDER: Dr. Fanny Dance  REFERRING DIAG: s/p right rTSA  THERAPY DIAG:  Status post reverse total replacement of right shoulder  Stiffness of right shoulder, not  elsewhere classified  Muscle weakness (generalized)  Stiffness of left shoulder, not elsewhere classified  Rationale for Evaluation and Treatment: Rehabilitation  ONSET DATE: 09/06/22  SUBJECTIVE:                                                                                                                                                                                      SUBJECTIVE STATEMENT:  Left shld sucks, RT arm decent    Hand dominance: Right  PERTINENT HISTORY: HTN Systemic Lupus Erythematosus ESRD Immunocompromised Hx of DVT  Hx of rTSA - 09/19/22, 06/24/22 L AV Fistula revision - 01/24/23  PAIN:  Are you having pain? Yes: NPRS scale: 3/10 Pain location: both  shoulder Pain description: sharp sometimes, others achy Aggravating factors: moving into certain positions Relieving factors: laying on her back, sometimes hydrocodone, tylenol, muscle relaxer, topical advil   PRECAUTIONS: Fall and Other: AV Shunt L UE - no BP cuff  RED FLAGS: None   WEIGHT BEARING RESTRICTIONS: No  FALLS:  Has patient fallen in last 6 months? No  LIVING ENVIRONMENT: Lives with: lives with their family Lives in: House/apartment Stairs:  none Has following equipment at home: Single point cane, Environmental consultant - 4 wheeled, and Wheelchair (manual), utilize rollator post dialysis days  OCCUPATION: Nothing rn, previously Occupational psychologist; wants to get into medical billing  PLOF: Independent and Independent with basic ADLs  PATIENT GOALS:improve ROM, be able to lift arms overhead, driving  NEXT MD VISIT:   OBJECTIVE:  Note: Objective measures were completed at Evaluation unless otherwise noted.  DIAGNOSTIC FINDINGS:  No recent relevant imaging  COGNITION: Overall cognitive status: Within functional limits for tasks assessed     SENSATION: [facility]   UPPER EXTREMITY ROM:   Active ROM Right eval Left eval RT Seated 05/16/23 Act/Pass  Shoulder flexion AROM:  23* PROM: ~120* 35* 60/160  Shoulder extension     Shoulder abduction 35* PROM: ~120* 33* 60/163   Shoulder adduction     Shoulder internal rotation SI joint* T4*   Shoulder external rotation Mastoid process* C7*   Elbow flexion [facility] WFL   Elbow extension [facility]   Wrist flexion [facility] WFL   Wrist extension [facility] WFL   Wrist ulnar deviation WFL WFL   Wrist radial deviation WFL WFL   Wrist pronation     Wrist supination     (Blank rows = not tested, * = painful)  UPPER EXTREMITY MMT:  MMT Right eval Left eval RT 05/16/23  Shoulder flexion 2- 2- 2+  Shoulder extension     Shoulder abduction 2- 2- 2+  Shoulder adduction     Shoulder internal rotation 2- 2- 3-  Shoulder external rotation 2- 2- 3-  Middle trapezius     Lower trapezius     Elbow flexion     Elbow extension     Wrist flexion     Wrist extension     Wrist ulnar deviation     Wrist radial deviation     Wrist pronation     Wrist supination     Grip strength (lbs)     (Blank rows = not tested)  JOINT MOBILITY TESTING:  Inferior AMA - firm end feel, no pain, normal motion Posterior AMA - firm end feel, no pain, normal motion  PALPATION:  Not tender to palpation  TREATMENT DATE:   05/18/23 UBE L 2 3 min fwd/ 3 min back 3# seated cane ex chest press and flexion 2 sets 10 3# standing cane ex shld ext and IR 2 sets 10 1# seated func reaching 10 x 3 sets 1# bicep curl palm up 15x thumb up 15 x 1# supine Er,IR and chest press 10 x 1# supine flexion circles 10 each way with assist PROM BIL shlds    05/16/23 Nustep L 4 UBE L 2 2 min fwd/back ROM/MMT and goals checked Seated Row 15# 2 sets 10 Lat Pull 15# 2 sets 10 with PTA assistance Chest press 5# 10x - painful BIL L>RT 2# rotation behind back Bicep curl 3# 2 sets 10 ( palm up, thumb up) Seated 2# cane ex 10 x shld flex,chest  press,abd Supine 2# cane ex 10 x chest press,flex,abd PROM BIL shlds   05/09/23 NuStep L 3 x 4 min UBE L1 x 2 min each AAROM standing flex, Ext, IR up back dowel 2x10 Rows & Ext yellow 2x10 RUE supine 1lb ER 2x10, Flex 3x5, IR 2x10 AAROM supine flex & Chest press 3lb WaTE 2x10  PROM BIL UE supine with end range holds  Supine horiz abd yellow 2x10  05/04/23 NuStep L 3 x 5 min 1lb cane Flex, Ext,  x10 PROM BIL UE supine with end range holds  AAROM supine flex & Chest press 3lb WaTE 2x10  RUE supine 1lb ER 2x10, Flex 2x5, IR 2x10 RUE supine shoulder abd 2x10 Seated Triceps Ext red 2x15  05/02/23 PROM BIL UE supine Supine 1# wt chest press,flex,abd,IR and ER 10 x 3# cane ex supine shld flex and chest press 10 x UBE 2 min fwd/2 min backward L2 Nustep L 3 5 min  04/20/23 - EVAL   PATIENT EDUCATION: Education details: POC and HEP Person educated: Patient Education method: Medical illustrator Education comprehension: verbalized understanding  HOME EXERCISE PROGRAM: Access Code: [email or web address] URL: [email or web address] Date: 04/20/2023 Prepared by: Cassie Freer  Exercises - Standing Shoulder Row with Anchored Resistance  - 1 x daily - 7 x weekly - 2 sets - 10 reps - Supine Shoulder Flexion AAROM with Dowel  - 1 x daily - 7 x weekly - 2 sets - 10 reps - Seated Shoulder Flexion Towel Slide at Table Top  - 1 x daily - 7 x weekly - 2 sets - 10 reps - Seated Shoulder Abduction Towel Slide at Table Top  - 1 x daily - 7 x weekly - 2 sets - 10 reps - Standing Bilateral Shoulder Internal Rotation AAROM with Dowel  - 1 x daily - 7 x weekly - 2 sets - 10 reps - Seated Shoulder External Rotation AAROM with Dowel  - 1 x daily - 7 x weekly - 2 sets - 10 reps   ASSESSMENT:  CLINICAL IMPRESSION:  porgressed session on RT shld strengthening. PROM is good but she is very weak, minimized use of Left shld d/t pain OBJECTIVE IMPAIRMENTS: decreased mobility, decreased ROM,  decreased strength, and pain.   ACTIVITY LIMITATIONS: carrying, lifting, sleeping, bathing, toileting, dressing, reach over head, and hygiene/grooming  PARTICIPATION LIMITATIONS: meal prep, cleaning, laundry, driving, community activity, and occupation  PERSONAL FACTORS: Age, Behavior pattern, Education, Fitness, Past/current experiences, Time since onset of injury/illness/exacerbation, and 1 comorbidity: ESRD  are also affecting patient's functional outcome.   REHAB POTENTIAL: Good  CLINICAL DECISION MAKING: Stable/uncomplicated  EVALUATION COMPLEXITY: Low  GOALS: Goals reviewed with patient? Yes  SHORT TERM  GOALS: Target date: 06/01/23  Patient will be independent with initial HEP.  Baseline: given 04/20/23 Goal status: Progressing  MET 05/16/23  2.  Patient will be able to get 90 degrees of shoulder flexion and abduction AROM against gravity. Baseline: see chart above Goal status: progressing 05/16/23  3.  Patient will be able to reach T12 (IR) and T1 (ER) with right arm reaching behind the back.  Baseline: SI joint Goal status: progressing 05/16/23   LONG TERM GOALS: Target date: 07/13/23  Patient will be independent with advanced/ongoing HEP to improve outcomes and carryover.  Baseline:  Goal status: INITIAL  2.  Patient will report 75% improvement in right shoulder pain to improve QOL.  Baseline: 3/10 Goal status: INITIAL  3.  Patient to improve bilateral shoulder AROM to [facility] without pain provocation to allow for increased ease of ADLs.  Baseline: see chart above Goal status: INITIAL  4.  Patient will demonstrate improved functional UE strength as demonstrated by 4-/5 MMT. Baseline: grossly 2-/5 Goal status: INITIAL    PLAN:  PT FREQUENCY: 2x/week  PT DURATION: 12 weeks  PLANNED INTERVENTIONS: 97164- PT Re-evaluation, 97110-Therapeutic exercises, 97530- Therapeutic activity, 97112- Neuromuscular re-education, 97535- Self Care, [address]- Manual therapy, 97014-  Electrical stimulation (unattended), 97016- Vasopneumatic device, 97035- Ultrasound, Dry Needling, Joint mobilization, Cryotherapy, and Moist heat  PLAN FOR NEXT SESSION: improve shoulder AROM, GH and periscapular muscle strength.    Suanne Marker, PTA  [facility] at [facility] [address] [facility]. [place], [place], [address] Phone: [phone]   Fax:  [phone]  Patient Details  Name: Anne Robinson MRN: [id number] Date of Birth: 06-Feb-1991 Referring Provider:  Laqueta Due., MD  Encounter Date: 05/18/2023   Suanne Marker, PTA 05/18/2023, 2:07 PM  [facility] [address] [place], [place], [address] Phone: [phone]   Fax:  [phone] [address] [facility] [address] [place], [place], [address] Phone: [phone]   Fax:  [phone]  Patient Details  Name: Anne Robinson MRN: [id number] Date of Birth: 09-07-1990 Referring Provider:  Laqueta Due., MD  Encounter Date: 05/18/2023   Suanne Marker, PTA 05/18/2023, 2:07 PM  [place]  [facility] at [facility] [address] [place], [place], [address] Phone: [phone]   Fax:  [phone]

## 2023-06-06 IMAGING — DX DG CHEST 2V
2 series · 2 of 2 positions shown · non-contrast
Comparison: 10/08/2020

CLINICAL DATA: Fatigue and extremity pain.

EXAM:
CHEST - 2 VIEW

[chest pa]
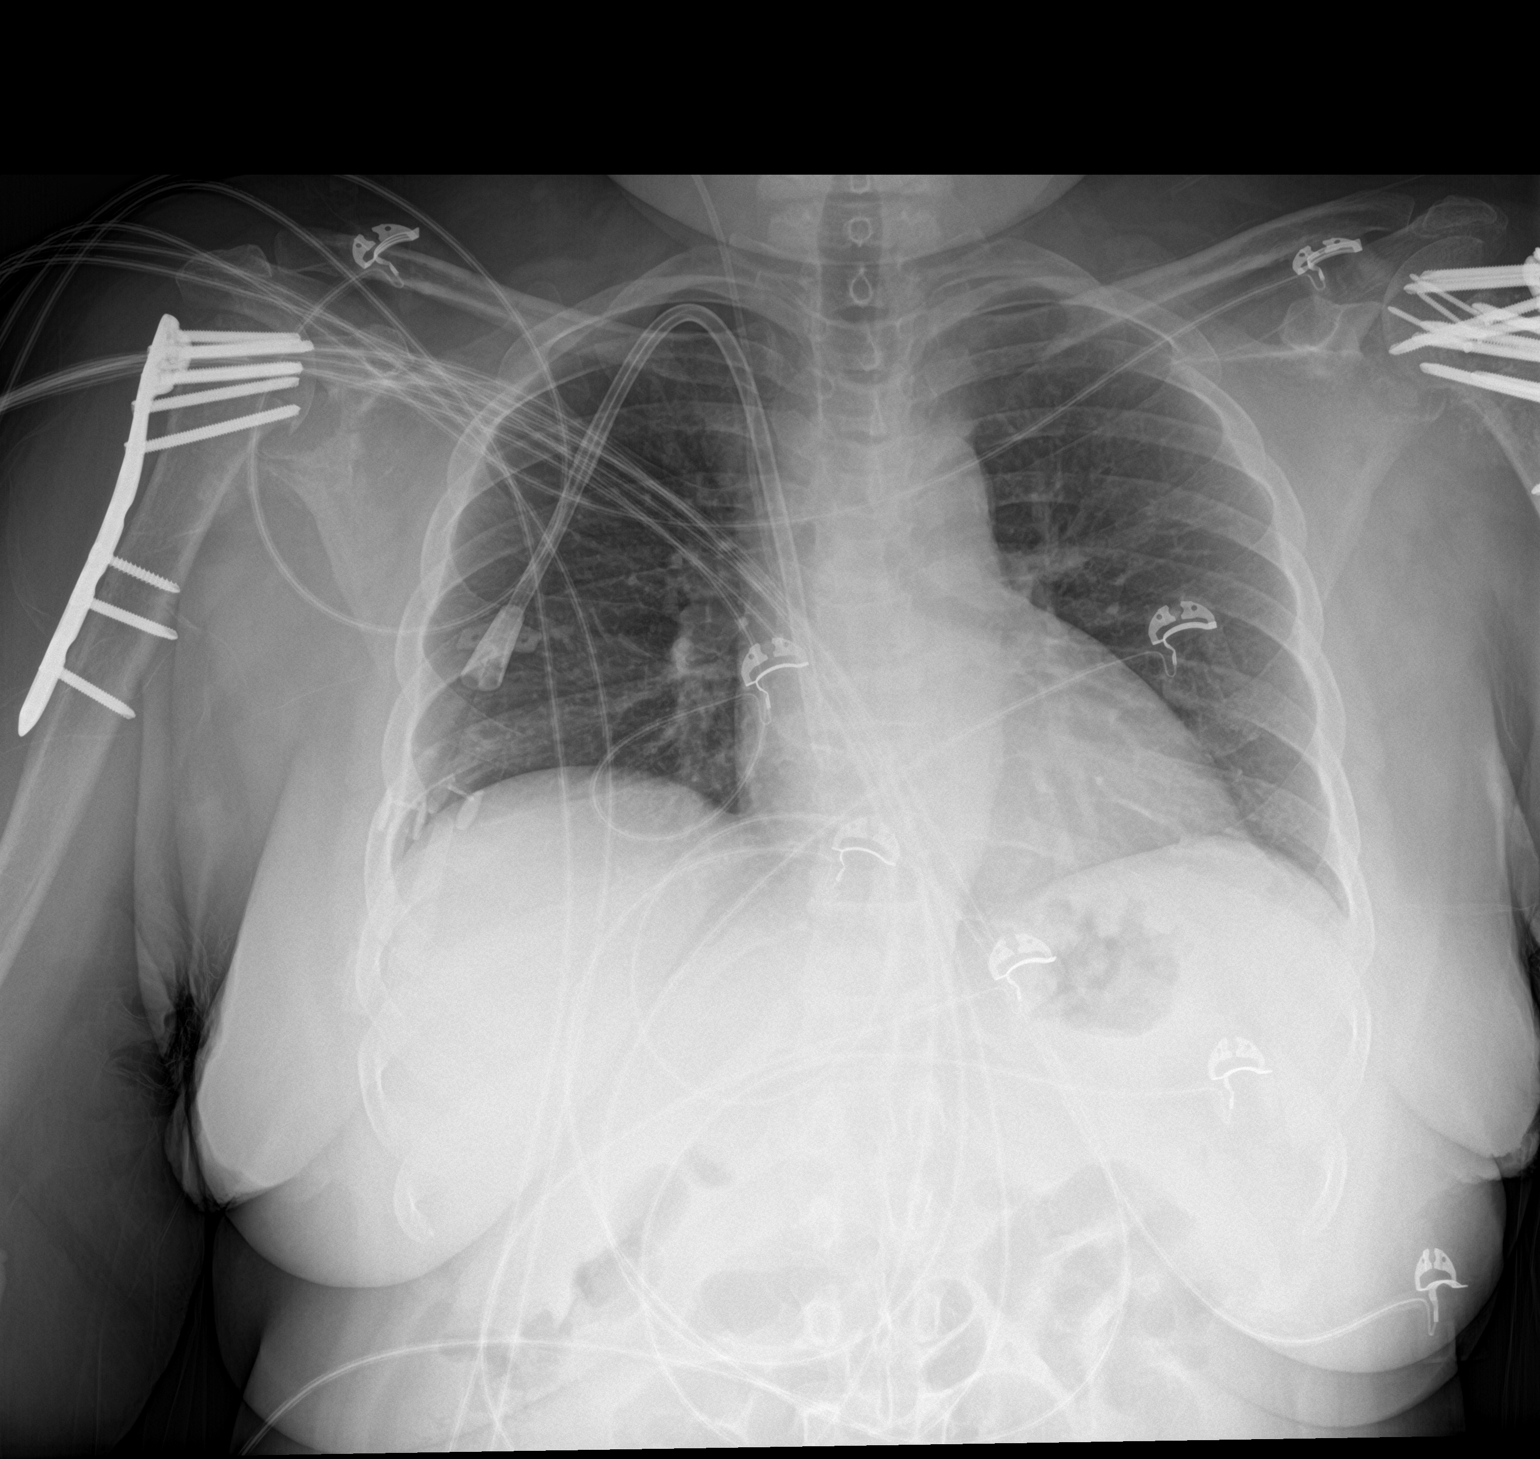

[chest lat]
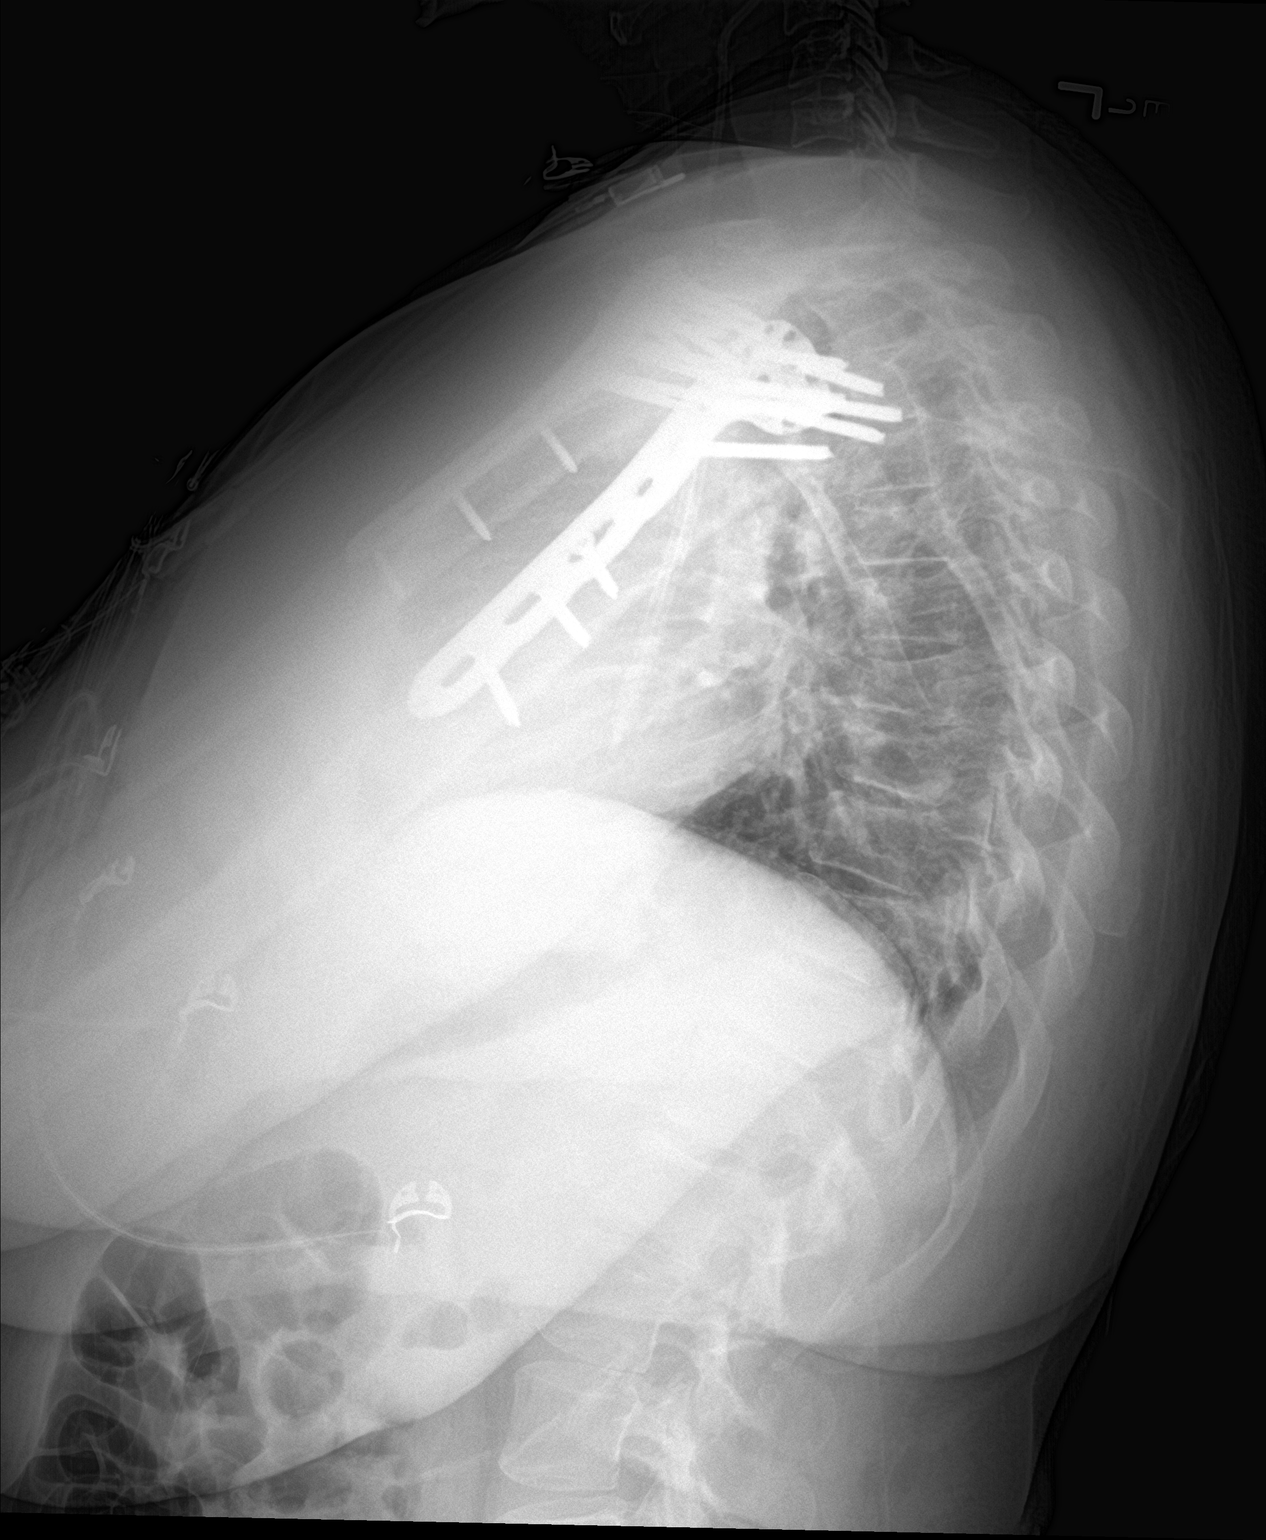

[2 of 2 positions shown; findings below may reference images not displayed]

FINDINGS: Right jugular and subclavian central venous catheters with tip over
the cavoatrial junction region. Postoperative changes in the
shoulders. Heart size and pulmonary vascularity are normal. Lungs
are clear. No pneumothorax. Mediastinal contours appear intact.
IMPRESSION: No active cardiopulmonary disease.

## 2023-06-28 ENCOUNTER — Encounter: Payer: Self-pay | Admitting: Allergy

## 2023-06-28 ENCOUNTER — Ambulatory Visit (INDEPENDENT_AMBULATORY_CARE_PROVIDER_SITE_OTHER): Admitting: Allergy

## 2023-06-28 ENCOUNTER — Other Ambulatory Visit: Payer: Self-pay

## 2023-06-28 VITALS — BP 118/70 | HR 95 | Temp 98.1°F | Resp 18 | Ht 58.75 in | Wt 166.5 lb

## 2023-06-28 DIAGNOSIS — H1013 Acute atopic conjunctivitis, bilateral: Secondary | ICD-10-CM | POA: Diagnosis not present

## 2023-06-28 DIAGNOSIS — J310 Chronic rhinitis: Secondary | ICD-10-CM | POA: Diagnosis not present

## 2023-06-28 DIAGNOSIS — L299 Pruritus, unspecified: Secondary | ICD-10-CM

## 2023-06-28 DIAGNOSIS — H109 Unspecified conjunctivitis: Secondary | ICD-10-CM

## 2023-06-28 MED ORDER — FEXOFENADINE HCL 60 MG PO TABS: 60.0000 mg | ORAL_TABLET | Freq: Every day | ORAL | 5 refills | Status: AC

## 2023-06-28 MED ORDER — MONTELUKAST SODIUM 10 MG PO TABS: 10.0000 mg | ORAL_TABLET | Freq: Every day | ORAL | 5 refills | Status: AC

## 2023-06-28 MED ORDER — OLOPATADINE HCL 0.2 % OP SOLN: 1.0000 [drp] | Freq: Every day | OPHTHALMIC | 5 refills | Status: AC | PRN

## 2023-06-28 MED ORDER — RYALTRIS 665-25 MCG/ACT NA SUSP: 2.0000 | Freq: Two times a day (BID) | NASAL | 2 refills | Status: AC

## 2023-06-28 NOTE — Progress Notes (Signed)
 New Patient Note  RE: Anne Robinson MRN: [id number] DOB: [birth year] Date of Office Visit: 06/28/2023  Primary care provider: Naoma Bacca., MD  Chief Complaint: Allergies  History of present illness: Anne Robinson is a 33 y.o. female presenting today for evaluation of allergic rhinitis. Discussed the use of AI scribe software for clinical note transcription with the patient, who gave verbal consent to proceed.  She experiences persistent itching all over her body, described as feeling like it is 'from the inside out.' The itching persists despite the use of moisturizing oils and lotions.  Her eyes and ears itch.  She has nasal congestion and drainage.  Symptoms worse during pollen seasons and seems to be getting worse. Due to her kidney disease and dialysis, she is cautious about using many over-the-counter medications.  She is on hemodialysis three times a week (Monday, Wednesday, Friday) for chronic kidney disease.  She has tried Benadryl , which provides inconsistent relief. She has not tried Xyzal but has used Zyrtec, which provided inconsistent relief.  She is currently taking montelukast  (Singulair ). She has tried Claritin  without much benefit in the past. She experiences nasal itching and uses Flonase  as needed for nasal symptoms and does find it helps with postnasal drip control.   No history of asthma, food allergies, or eczema.    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 No pertinent family history.  Social history: Lives in an apartment with carpeting with electric heating and central cooling.  No pets in the home.  Dogs outside the home.  No concern for water damage, mildew or roaches in the home.  Does not report occupation at this time.  Denies a smoking history.   Medication List: Current Outpatient Medications  Medication Sig Dispense Refill   amLODipine  (NORVASC ) 5 MG tablet Take 5 mg by mouth daily.     atorvastatin  (LIPITOR) 40 MG tablet Take 40 mg by mouth daily.     butalbital -acetaminophen -caffeine  (FIORICET ) 50-325-40 MG tablet Take 1 tablet by mouth every 6 (six) hours as needed for headache. 14 tablet 0   cetirizine (ZYRTEC) 10 MG tablet Take 10 mg by mouth daily.     fluticasone  (FLONASE ) 50 MCG/ACT nasal spray Place 1 spray into both nostrils daily as needed for allergies.     hydrALAZINE  (APRESOLINE ) 25 MG tablet Take 25 mg by mouth 3 (three) times daily.     levETIRAcetam  (KEPPRA ) 500 MG tablet Take 500 mg by mouth daily.     levothyroxine  (SYNTHROID ) 50 MCG tablet Take 50 mcg by mouth daily.     metoprolol  succinate (TOPROL -XL) 50 MG 24 hr tablet Take 50 mg by mouth 2 (two) times daily.     montelukast  (SINGULAIR ) 10 MG tablet Take 10 mg by mouth at bedtime.     ondansetron  (ZOFRAN -ODT) 4 MG disintegrating tablet Take 1 tablet (4 mg  total) by mouth every 8 (eight) hours as needed for nausea or vomiting. 20 tablet 0   predniSONE  (DELTASONE ) 5 MG tablet Take 5 mg by mouth daily with breakfast.     prochlorperazine  (COMPAZINE ) 10 MG tablet Take 1 tablet (10 mg total) by mouth 2 (two) times daily as needed for nausea or vomiting. 20 tablet 0   tiZANidine  (ZANAFLEX ) 4 MG tablet Take 4 mg by mouth at bedtime.     traZODone  (DESYREL ) 50 MG tablet Take 25 mg by mouth at bedtime as needed for sleep.     acetaminophen  (TYLENOL ) 500 MG tablet Take 1,000 mg by mouth.     brimonidine (ALPHAGAN) 0.2 % ophthalmic solution  SMARTSIG:In Eye(s)     ELIQUIS  5 MG TABS tablet Take 5 mg by mouth 2 (two) times daily.     pantoprazole  (PROTONIX ) 20 MG tablet Take 2 tablets (40 mg total) by mouth daily for 14 days. 28 tablet 0   No current facility-administered medications for this visit.    Known medication allergies: Allergies  Allergen Reactions   Reglan  [Metoclopramide ] Itching, Palpitations and Other (See Comments)    Muscle twitching restlessness    Dilaudid  [Hydromorphone ] Itching   Isosorbide Other (See Comments)    headaches   Ivp Dye [Iodinated Contrast Media] Itching   Oxycodone  Itching   Zestril [Lisinopril] Swelling    Physical examination: Blood pressure 118/70, pulse 95, temperature 98.1 F (36.7 C), temperature source Temporal, resp. rate 18, height 4' 10.75" (1.492 m), weight 166 lb 8 oz (75.5 kg), SpO2 100%.  General: Alert, interactive, in no acute distress. HEENT: PERRLA, cerumen obstructing TMs bilaterally, turbinates moderately edematous without discharge, post-pharynx non erythematous. Neck: Supple without lymphadenopathy. Lungs: Clear to auscultation without wheezing, rhonchi or rales. {no increased work of breathing. CV: Normal S1, S2 without murmurs. Abdomen: Nondistended, nontender. Skin: Warm and dry, without lesions or rashes. Extremities:  No clubbing, cyanosis or edema. Neuro:   Grossly intact.  Diagnositics/Labs: None today  Assessment and plan: Rhinoconjunctivitis - Use Ryaltris  nasal spray 2 sprays each nostril twice a day for nasal congestion or drainage control.  This is a combination spray with nasal steroid, mometasone, for congestion control and nasal antihistamine, olopatadine , for drainage control.   With using nasal sprays point tip of bottle toward eye on same side nostril and lean head slightly forward for best technique.   - Try Allegra  60mg  daily (you can also half the 180mg  OTC dosing).  - Use Pataday  1 drop each eye daily as needed for itchy/watery  eyes - Continue Singulair  10mg  daily.  - Order blood allergy testing and will call you with these results. - Discuss allergy shots if testing is positive and if medication management is not effective enough in controlling symptoms  Pruritus (itching) Generalized itching without rash, persistent year-round. Benadryl  provides inconsistent relief.  - Prescribe Allegra  (fexofenadine ) with dose adjustment for dialysis as above - Consider carbinoxamine if Allegra  is ineffective. - Continue montelukast  (Singulair ).  Follow-up in 3-4 months or sooner if needed  I appreciate the opportunity to take part in Anne Robinson's care. Please do not hesitate to contact me with questions.  Sincerely,   Catha Clink, MD Allergy/Immunology [facility]

## 2023-06-28 NOTE — Patient Instructions (Addendum)
 Rhinoconjunctivitis - Use Ryaltris  nasal spray 2 sprays each nostril twice a day for nasal congestion or drainage control.  This is a combination spray with nasal steroid, mometasone, for congestion control and nasal antihistamine, olopatadine , for drainage control.   With using nasal sprays point tip of bottle toward eye on same side nostril and lean head slightly forward for best technique.   - Try Allegra  60mg  daily (you can also half the 180mg  OTC dosing).  - Use Pataday  1 drop each eye daily as needed for itchy/watery eyes - Continue Singulair  10mg  daily.  - Order blood allergy testing and will call you with these results. - Discuss allergy shots if testing is positive and if medication management is not effective enough in controlling symptoms  Pruritus (itching) Generalized itching without rash, persistent year-round. Benadryl  provides inconsistent relief.  - Prescribe Allegra  (fexofenadine ) with dose adjustment for dialysis as above - Consider carbinoxamine if Allegra  is ineffective. - Continue montelukast  (Singulair ).  Follow-up in 3-4 months or sooner if needed

## 2023-07-03 LAB — ALLERGENS W/TOTAL IGE AREA 2
Alternaria Alternata IgE: <0.1 kU/L
Aspergillus Fumigatus IgE: <0.1 kU/L
Bermuda Grass IgE: <0.1 kU/L
Cat Dander IgE: <0.1 kU/L
Cedar, Mountain IgE: <0.1 kU/L
Cladosporium Herbarum IgE: <0.1 kU/L
Cockroach, German IgE: <0.1 kU/L
Common Silver Birch IgE: <0.1 kU/L
Cottonwood IgE: <0.1 kU/L
D Farinae IgE: <0.1 kU/L
D Pteronyssinus IgE: <0.1 kU/L
Dog Dander IgE: <0.1 kU/L
Elm, American IgE: <0.1 kU/L
IgE (Immunoglobulin E), Serum: <2 [IU]/mL — ABNORMAL LOW (ref 6–495)
Johnson Grass IgE: <0.1 kU/L
Maple/Box Elder IgE: <0.1 kU/L
Mouse Urine IgE: <0.1 kU/L
Oak, White IgE: <0.1 kU/L
Pecan, Hickory IgE: <0.1 kU/L
Penicillium Chrysogen IgE: <0.1 kU/L
Pigweed, Rough IgE: <0.1 kU/L
Ragweed, Short IgE: <0.1 kU/L
Sheep Sorrel IgE Qn: <0.1 kU/L
Timothy Grass IgE: <0.1 kU/L
White Mulberry IgE: <0.1 kU/L

## 2023-07-04 ENCOUNTER — Encounter: Payer: Self-pay | Admitting: Allergy

## 2023-08-07 NOTE — Progress Notes (Signed)
   [address] [place] [address] Dept: [phone]  FOLLOW UP NOTE  Patient ID: Anne Robinson, female    DOB: September 04, 1990  Age: 33 y.o. MRN: [id number] Date of Office Visit: 08/10/2023  Assessment  Chief Complaint: No chief complaint on file.  HPI Anne Robinson   Discussed the use of AI scribe software for clinical note transcription with the patient, who gave verbal consent to proceed.  History of Present Illness      Drug Allergies:  Allergies  Allergen Reactions   Reglan  [Metoclopramide ] Itching, Palpitations and Other (See Comments)    Muscle twitching restlessness    Dilaudid  [Hydromorphone ] Itching   Isosorbide Other (See Comments)    headaches   Ivp Dye [Iodinated Contrast Media] Itching   Oxycodone  Itching   Zestril [Lisinopril] Swelling    Physical Exam: There were no vitals taken for this visit.   Physical Exam  Diagnostics:    Assessment and Plan: No diagnosis found.  No orders of the defined types were placed in this encounter.   There are no Patient Instructions on file for this visit.  No follow-ups on file.    Thank you for the opportunity to care for this patient.  Please do not hesitate to contact me with questions.  Marinus Sic, FNP [facility]

## 2023-08-10 ENCOUNTER — Encounter: Payer: Self-pay | Admitting: Family Medicine

## 2023-08-10 ENCOUNTER — Ambulatory Visit (INDEPENDENT_AMBULATORY_CARE_PROVIDER_SITE_OTHER): Admitting: Family Medicine

## 2023-08-10 DIAGNOSIS — J310 Chronic rhinitis: Secondary | ICD-10-CM

## 2023-08-10 DIAGNOSIS — H109 Unspecified conjunctivitis: Secondary | ICD-10-CM

## 2023-08-10 NOTE — Patient Instructions (Addendum)
 Rhinoconjunctivitis Return to the clinic for environmental allergy skin testing.  Remember to stop your antihistamines for 3 days before the testing appointment - Use Ryaltris  nasal spray 2 sprays each nostril twice a day for nasal congestion or drainage control.  This is a combination spray with nasal steroid, mometasone, for congestion control and nasal antihistamine, olopatadine , for drainage control.   With using nasal sprays point tip of bottle toward eye on same side nostril and lean head slightly forward for best technique.   - Try Allegra  60mg  daily (you can also half the 180mg  OTC dosing).  - Use Pataday  1 drop each eye daily as needed for itchy/watery eyes - Continue Singulair  10mg  daily.  - Order blood allergy testing and will call you with these results. - Discuss allergy shots if testing is positive and if medication management is not effective enough in controlling symptoms  Pruritus (itching) Generalized itching without rash, persistent year-round. Benadryl  provides inconsistent relief.  - Prescribe Allegra  (fexofenadine ) with dose adjustment for dialysis as above - Consider carbinoxamine if Allegra  is ineffective. - Continue montelukast  (Singulair ).  Follow-up in 3-4 months or sooner if needed

## 2023-08-21 ENCOUNTER — Encounter: Payer: Self-pay | Admitting: Allergy

## 2023-08-21 ENCOUNTER — Other Ambulatory Visit: Payer: Self-pay | Admitting: Family Medicine

## 2023-08-21 MED ORDER — CARBINOXAMINE MALEATE 4 MG PO TABS: ORAL_TABLET | ORAL | 0 refills | Status: DC

## 2023-08-21 NOTE — Telephone Encounter (Signed)
 Carbinoxamine ordered. Thank you

## 2023-08-22 ENCOUNTER — Telehealth: Payer: Self-pay | Admitting: Allergy

## 2023-08-22 ENCOUNTER — Other Ambulatory Visit: Payer: Self-pay | Admitting: Family Medicine

## 2023-08-22 NOTE — Telephone Encounter (Signed)
 Per 06/28/23 office notes:  Pruritus (itching) Generalized itching without rash, persistent year-round. Benadryl  provides inconsistent relief.  - Prescribe Allegra  (fexofenadine ) with dose adjustment for dialysis as above - Consider carbinoxamine if Allegra  is ineffective. - Continue montelukast  (Singulair ).    Carbinoxamine Maleate 4 mg has been sent to [facility] [address] on 08/21/23.  Called patient - DOB/DPR/Pharmacy verified - advised of above notation.  Patient stated she was unknown - she was not contacted by our office or [facility] to advise.    I apologized to patient - advised to contact [facility] now to see when medication will be ready for pick up.  Patient verbalized understanding to all, no questions.

## 2023-08-22 NOTE — Telephone Encounter (Signed)
 PT called to advise that benadryl  was not as effective as it was, and was wondering if [facility]ee could send other medication that was discussed to pharmacy. Advised will call back if provider requires scheduled appt, she thanked

## 2023-08-23 ENCOUNTER — Encounter (HOSPITAL_BASED_OUTPATIENT_CLINIC_OR_DEPARTMENT_OTHER): Payer: Self-pay | Admitting: Emergency Medicine

## 2023-08-23 ENCOUNTER — Other Ambulatory Visit: Payer: Self-pay

## 2023-08-23 ENCOUNTER — Emergency Department (HOSPITAL_BASED_OUTPATIENT_CLINIC_OR_DEPARTMENT_OTHER)
Admission: EM | Admit: 2023-08-23 | Discharge: 2023-08-23 | Disposition: A | Discharge status: Home / Self Care | Attending: Emergency Medicine | Admitting: Emergency Medicine

## 2023-08-23 DIAGNOSIS — L02412 Cutaneous abscess of left axilla: Secondary | ICD-10-CM | POA: Insufficient documentation

## 2023-08-23 DIAGNOSIS — L0291 Cutaneous abscess, unspecified: Secondary | ICD-10-CM

## 2023-08-23 MED ORDER — LIDOCAINE HCL URETHRAL/MUCOSAL 2 % EX GEL: 1.0000 | Freq: Once | CUTANEOUS | Status: DC

## 2023-08-23 MED ORDER — DOXYCYCLINE HYCLATE 100 MG PO CAPS: 100.0000 mg | ORAL_CAPSULE | Freq: Two times a day (BID) | ORAL | 0 refills | Status: AC

## 2023-08-23 MED ORDER — LIDOCAINE-EPINEPHRINE-TETRACAINE (LET) TOPICAL GEL
3.0000 mL | Freq: Once | TOPICAL | Status: DC
  Filled 2023-08-23: qty 3

## 2023-08-23 MED ORDER — CLOTRIMAZOLE 1 % VA CREA: 1.0000 | TOPICAL_CREAM | Freq: Every day | VAGINAL | 0 refills | Status: AC

## 2023-08-23 MED ORDER — ONDANSETRON 4 MG PO TBDP: 4.0000 mg | ORAL_TABLET | Freq: Three times a day (TID) | ORAL | 0 refills | Status: AC | PRN

## 2023-08-23 MED ORDER — LIDOCAINE-EPINEPHRINE-TETRACAINE (LET) TOPICAL GEL
3.0000 mL | Freq: Once | TOPICAL | Status: AC
  Administered 2023-08-23: 3 mL via TOPICAL

## 2023-08-23 MED ORDER — LIDOCAINE-EPINEPHRINE (PF) 2 %-1:200000 IJ SOLN
10.0000 mL | Freq: Once | INTRAMUSCULAR | Status: AC
  Administered 2023-08-23: 10 mL
  Filled 2023-08-23: qty 20

## 2023-08-23 NOTE — ED Provider Notes (Signed)
 [facility] Provider Note   CSN: [id number] Arrival date & time: 08/23/23  1158     Patient presents with: Abscess   Anne Robinson is a 33 y.o. female.    Abscess   33 year old female presents emergency department with complaints of abscess.  Patient reports area began as a nodule in her left armpit area about a week ago.  Reports worsening pain over the past few days.  States that it been draining a little bit.  Denies any fevers, chills, nausea, vomiting.  States she is being worked up for hidradenitis suppurativa but has not had an official diagnosis yet.  Past medical history significant for hypertension, angioedema, lupus nephritis, pseudotumor cerebri, thyroid disease, Pepcid, ESRD on dialysis, DVT, VP shunt  Prior to Admission medications   Medication Sig Start Date End Date Taking? Authorizing Provider  acetaminophen  (TYLENOL ) 500 MG tablet Take 1,000 mg by mouth.    [provider]  amLODipine  (NORVASC ) 5 MG tablet Take 5 mg by mouth daily. 09/12/21   [provider]  atorvastatin  (LIPITOR) 40 MG tablet Take 40 mg by mouth daily. 07/11/21   [provider]  brimonidine (ALPHAGAN) 0.2 % ophthalmic solution SMARTSIG:In Eye(s)    [provider]  butalbital -acetaminophen -caffeine  (FIORICET ) 50-325-40 MG tablet Take 1 tablet by mouth every 6 (six) hours as needed for headache. 09/15/21   Claudene Maximino LABOR, MD  Carbinoxamine Maleate 4 MG TABS Take 1-2 tablets up to twice a day if needed for a runny nose or itch 08/21/23   Ambs, Arlean HERO, FNP  fexofenadine  (ALLEGRA  ALLERGY) 60 MG tablet Take 1 tablet (60 mg total) by mouth daily. 06/28/23   Jeneal Danita Macintosh, MD  fluticasone  (FLONASE ) 50 MCG/ACT nasal spray Place 1 spray into both nostrils daily as needed for allergies.    [provider]  hydrALAZINE  (APRESOLINE ) 25 MG tablet Take 25 mg by mouth 3 (three) times daily. 09/13/21   [provider]  levETIRAcetam  (KEPPRA ) 500 MG tablet Take 500 mg by mouth daily. 08/12/21   [provider]  levothyroxine  (SYNTHROID ) 50 MCG tablet Take 50 mcg by mouth daily. 09/04/21   [provider]  metoprolol  succinate (TOPROL -XL) 50 MG 24 hr tablet Take 50 mg by mouth 2 (two) times daily. 07/11/21   [provider]  montelukast  (SINGULAIR ) 10 MG tablet Take 1 tablet (10 mg total) by mouth at bedtime. 06/28/23   Jeneal Danita Macintosh, MD  Olopatadine  HCl (PATADAY ) 0.2 % SOLN Place 1 drop into both eyes daily as needed (itchy/watery eyes). 06/28/23   Jeneal Danita Macintosh, MD  Olopatadine -Mometasone (RYALTRIS ) 665-25 MCG/ACT SUSP Place 2 sprays into the nose 2 (two) times daily. 06/28/23   Jeneal Danita Macintosh, MD  ondansetron  (ZOFRAN -ODT) 4 MG disintegrating tablet Take 1 tablet (4 mg total) by mouth every 8 (eight) hours as needed for nausea or vomiting. 05/01/22   Dreama Longs, MD  pantoprazole  (PROTONIX ) 20 MG tablet Take 2 tablets (40 mg total) by mouth daily for 14 days. 05/01/22 05/15/22  Dreama Longs, MD  predniSONE  (DELTASONE ) 5 MG tablet Take 5 mg by mouth daily with breakfast.    [provider]  prochlorperazine  (COMPAZINE ) 10 MG tablet Take 1 tablet (10 mg total) by mouth 2 (two) times daily as needed for nausea or vomiting. 05/23/22   Tegeler, Lonni PARAS, MD  tiZANidine  (ZANAFLEX ) 4 MG tablet Take 4 mg by mouth at bedtime. 09/13/21   [provider]  traZODone  (DESYREL ) 50 MG tablet Take 25 mg by mouth at bedtime as needed for sleep. 08/14/21   [provider]  temazepam (RESTORIL) 15 MG capsule Take by mouth. 01/21/19 12/07/19  [provider]    Allergies: Reglan  [metoclopramide ], Dilaudid  [hydromorphone ], Isosorbide, Ivp dye [iodinated contrast media], Oxycodone , and Zestril [lisinopril]    Review of Systems  All other systems reviewed and are negative.   Updated Vital Signs BP (!) 127/90   Pulse 100    Temp 99.1 F (37.3 C)   Resp 15   Ht 4' 10.75 (1.492 m)   Wt 83 kg   SpO2 100%   BMI 37.27 kg/m   Physical Exam Vitals and nursing note reviewed.  Constitutional:      General: She is not in acute distress.    Appearance: She is well-developed.  HENT:     Head: Normocephalic and atraumatic.   Eyes:     Conjunctiva/sclera: Conjunctivae normal.    Cardiovascular:     Rate and Rhythm: Normal rate and regular rhythm.     Heart sounds: No murmur heard. Pulmonary:     Effort: Pulmonary effort is normal. No respiratory distress.     Breath sounds: Normal breath sounds.  Abdominal:     Palpations: Abdomen is soft.     Tenderness: There is no abdominal tenderness.   Musculoskeletal:        General: No swelling.     Cervical back: Neck supple.   Skin:    General: Skin is warm and dry.     Capillary Refill: Capillary refill takes less than 2 seconds.     Comments: 4.6 cm in diameter area of palpable fluctuance left axilla.  Minimal surrounding erythema/induration.   Neurological:     Mental Status: She is alert.   Psychiatric:        Mood and Affect: Mood normal.     (all labs ordered are listed, but only abnormal results are displayed) Labs Reviewed - No data to display  EKG: None  Radiology: No results found.   .Incision and Drainage  Date/Time: 08/23/2023 12:39 PM  Performed by: Silver Wonda LABOR, PA Authorized by: Silver Wonda LABOR, PA   Consent:    Consent obtained:  Verbal   Consent given by:  Patient   Risks discussed:  Bleeding, incomplete drainage, pain and damage to other organs   Alternatives discussed:  No treatment Universal protocol:    Procedure explained and questions answered to patient or proxy's satisfaction: yes     Relevant documents present and verified: yes     Patient identity confirmed:  Verbally with patient Location:    Type:  Abscess   Size:  4.6   Location:  Upper extremity   Upper extremity location: Left  axilla. Pre-procedure details:    Skin preparation:  Chlorhexidine  with alcohol Anesthesia:    Anesthesia method:  Local infiltration   Local anesthetic:  Lidocaine  2% WITH epi Procedure type:    Complexity:  Complex Procedure details:    Incision types:  Single straight   Incision depth:  Subcutaneous   Wound management:  Probed and deloculated, irrigated with saline and extensive cleaning   Drainage:  Purulent   Drainage amount:  Moderate   Packing materials:  None Post-procedure details:    Procedure completion:  Tolerated well, no immediate complications    Medications Ordered in the ED  lidocaine -EPINEPHrine  (XYLOCAINE  W/EPI) 2 %-1:200000 (PF) injection 10 mL (has no administration in time range)  Medical Decision Making Risk Prescription drug management.   This patient presents to the ED for concern of abscess, this involves an extensive number of treatment options, and is a complaint that carries with it a high risk of complications and morbidity.  The differential diagnosis includes abscess, cellulitis, necrotizing infection, aneurysm, sepsis, other   Co morbidities that complicate the patient evaluation  See HPI   Additional history obtained:  Additional history obtained from EMR External records from outside source obtained and reviewed including hospital records   Lab Tests:  N/a   Imaging Studies ordered:  N/a   Cardiac Monitoring: / EKG:  The patient was maintained on a cardiac monitor.  I personally viewed and interpreted the cardiac monitored which showed an underlying rhythm of: sinus rhythm   Consultations Obtained:  N/a   Problem List / ED Course / Critical interventions / Medication management  Abscess I ordered medication including lidocaine  with epinephrine    Reevaluation of the patient after these medicines showed that the patient improved I have reviewed the patients home medicines and have  made adjustments as needed   Social Determinants of Health:  Denies tobacco, licit drug use.   Test / Admission - Considered:  Abscess Vitals signs within normal range and stable throughout visit. 33 year old female presents emergency department with complaints of abscess.  Patient reports area began as a nodule in her left armpit area about a week ago.  Reports worsening pain over the past few days.  States that it been draining a little bit.  Denies any fevers, chills, nausea, vomiting.  States she is being worked up for hidradenitis suppurativa but has not had an official diagnosis yet. On exam, 4.6 cm in diameter area of palpable fluctuance in left axillary space.  Bedside ultrasound performed confirmed fluid collection.  Area clean, anesthetized and drained in manner as above.  Given surrounding cellulitic skin changes, will place patient on antibiotics.  Will recommend follow-up with dermatology given concern for HS. patient does not meet SIRS criteria.  Treatment plan discussed with patient and she nausea send is agreeable to set plan.  Patient overall well-appearing, afebrile in no acute distress. Worrisome signs and symptoms were discussed with the patient, and the patient acknowledged understanding to return to the ED if noticed. Patient was stable upon discharge.       Final diagnoses:  None    ED Discharge Orders     None          Silver Wonda LABOR, [place] 08/23/23 1558    Darra Fonda MATSU, MD 09/01/23 [phone]

## 2023-08-23 NOTE — Discharge Instructions (Addendum)
 As discussed, recommend washing area gently with warm soapy water.  Change bandages daily.  Will put on antibiotics given concern for infection.  Recommend follow-up with dermatology in the outpatient setting for reassessment given concern for HS.  Please do not hesitate to return to emergency department if the worrisome signs and symptoms we discussed become apparent.

## 2023-08-23 NOTE — ED Triage Notes (Signed)
 Pt POV steady gait- c/o L axilla abscess x 1 week, minimal drainage. Denies fever.   Hx of same.

## 2023-08-29 ENCOUNTER — Other Ambulatory Visit: Payer: Self-pay | Admitting: Family Medicine

## 2023-08-29 NOTE — Telephone Encounter (Signed)
 Ok. Can you please find out if any antihistamines are covered. Cetirizine, levocetirizine, fexofenadine , or loratadine ? Thank you

## 2023-08-31 NOTE — Telephone Encounter (Signed)
 Can we please run a PA for Allegra ? She should not use levocetirizine with ESRD. Thank you

## 2023-09-01 ENCOUNTER — Telehealth: Payer: Self-pay

## 2023-09-01 ENCOUNTER — Other Ambulatory Visit (HOSPITAL_COMMUNITY): Payer: Self-pay

## 2023-09-01 NOTE — Telephone Encounter (Signed)
 OTC medications are not covered under Medicare Part D Law

## 2023-09-01 NOTE — Telephone Encounter (Signed)
 Can you please let this patient know that she will need to get her antihistamines over the counter and that generic is fine. Allerga 180 mg once a day. Thank you

## 2023-09-01 NOTE — Telephone Encounter (Signed)
 Per test claim: OTC medications are not covered under Medicare Part D law

## 2023-09-01 NOTE — Telephone Encounter (Signed)
 Please start a PA.

## 2023-09-05 NOTE — Telephone Encounter (Signed)
 I called the patient and had to leave a voicemail. Patient was instructed to call the office back in regards to a prescription/medication.

## 2023-09-19 NOTE — Telephone Encounter (Signed)
 I called the patient. Patient was informed the message and continued asking why it was not covered with her insurance since she was getting the generic allerga 60 mg with pharmacy. So I explained to the patient the pharmacy tech stated the medicare part d law not covering OTC medications.

## 2023-10-02 ENCOUNTER — Ambulatory Visit: Attending: Nurse Practitioner

## 2023-10-02 DIAGNOSIS — M25612 Stiffness of left shoulder, not elsewhere classified: Secondary | ICD-10-CM | POA: Diagnosis present

## 2023-10-02 DIAGNOSIS — R29898 Other symptoms and signs involving the musculoskeletal system: Secondary | ICD-10-CM | POA: Diagnosis present

## 2023-10-02 DIAGNOSIS — M6281 Muscle weakness (generalized): Secondary | ICD-10-CM | POA: Insufficient documentation

## 2023-10-02 DIAGNOSIS — G8929 Other chronic pain: Secondary | ICD-10-CM | POA: Diagnosis present

## 2023-10-02 DIAGNOSIS — M25611 Stiffness of right shoulder, not elsewhere classified: Secondary | ICD-10-CM | POA: Diagnosis present

## 2023-10-02 DIAGNOSIS — Z96611 Presence of right artificial shoulder joint: Secondary | ICD-10-CM | POA: Insufficient documentation

## 2023-10-02 DIAGNOSIS — M25512 Pain in left shoulder: Secondary | ICD-10-CM | POA: Diagnosis present

## 2023-10-02 NOTE — Therapy (Signed)
 OUTPATIENT PHYSICAL THERAPY SHOULDER EVALUATION   Patient Name: Anne Robinson MRN: [id number] DOB:1990-04-14, 33 y.o., female Today's Date: 10/02/2023  END OF SESSION:  PT End of Session - 10/02/23 1319     Visit Number 1    Date for PT Re-Evaluation 12/25/23    PT Start Time 1318    PT Stop Time 1400    PT Time Calculation (min) 42 min          Past Medical History:  Diagnosis Date   Angio-edema    HTN (hypertension) 05/15/2016   Hypothyroidism    Lupus    Lupus nephritis (HCC)    Pseudotumor cerebri    Renal disorder    Thyroid disease    Past Surgical History:  Procedure Laterality Date   INSERTION OF DIALYSIS CATHETER     RENAL BIOPSY     SHOULDER SURGERY     VENTRICULAR ATRIAL SHUNT Right 11/27/2018   Codman programmable shunt set at 150   Patient Active Problem List   Diagnosis Date Noted   Rhinoconjunctivitis 08/10/2023   ESRD on dialysis (HCC) 09/15/2021   Idiopathic intracranial hypertension 09/15/2021   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   Anemia of chronic renal failure 09/15/2021   History of DVT (deep vein thrombosis) 09/15/2021   Thrombocytopenia (HCC) 09/15/2021   Hypothyroidism 09/15/2021   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   Complicated migraine 09/14/2021   Cellulitis of chest wall    Acute renal failure (HCC) 01/26/2017   Exacerbation of systemic lupus erythematosus (HCC) 01/26/2017   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   Tachypnea 01/24/2017   Dyspnea 01/24/2017   Macular rash 01/24/2017   Abscess of left axilla 01/24/2017   Abscess of axilla, left 01/23/2017   Hypotension    Sepsis (HCC) 05/15/2016   Elevated troponin 05/15/2016   HTN (hypertension) 05/15/2016   SLE (systemic lupus erythematosus) (HCC) 05/15/2016    PCP: Camie Cools  REFERRING PROVIDER: Froylan Finical  REFERRING DIAG:  [phone] (ICD-10-CM) - Other displaced fracture of upper end of left humerus, initial encounter for closed fracture  Z96.611  (ICD-10-CM) - Presence of right artificial shoulder joint    THERAPY DIAG:  Status post reverse total replacement of right shoulder  Stiffness of left shoulder, not elsewhere classified  Stiffness of right shoulder, not elsewhere classified  Muscle weakness (generalized)  Other symptoms and signs involving the musculoskeletal system  Chronic left shoulder pain  Rationale for Evaluation and Treatment: Rehabilitation  ONSET DATE: 2021-2022  SUBJECTIVE:  SUBJECTIVE STATEMENT: I am back for the same stuff. Still trying to get a transplant. They did say that my L shoulder needs to be replaced. But we are waiting for my overall to be better before doing that again.   PERTINENT HISTORY: 08/30/23:  Patient is here today for MRI review. She has left shoulder proximal humerus ORIF. She has hardware in the left proximal humerus; ORIF proximal humerus with Dr. [place] 04/09/2020- a displaced left proximal humerus fracture . 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 She reports doing well from Right reverse TSA but has not been able to do a lot of physical therapy. Her motion is limited. Her left shoulder now hurts with movement.  We were denied for CT scan.   PAIN:  Are you having pain? Yes: NPRS scale: 6/10 Pain location: L shoulder, R shoulder sometimes hurts but not really a problem Pain description: achy, dull, constant Aggravating factors: picking up heavy items, getting dressed, over head motions  Relieving factors: Tylenol  sometimes   PRECAUTIONS: None  RED FLAGS: None   WEIGHT BEARING RESTRICTIONS: No  FALLS:  Has patient fallen in  last 6 months? No  LIVING ENVIRONMENT: Lives with: lives with their family Lives in: House/apartment   PLOF: Independent and Independent with basic ADLs  PATIENT GOALS:strengthen my arms, get as close to full ROM, be able to get dressed easier   NEXT MD VISIT:   OBJECTIVE:  Note: Objective measures were completed at Evaluation unless otherwise noted.  DIAGNOSTIC FINDINGS:  LEFT SHOULDER There is no evidence of an acute fracture or dislocation. A radiopaque fixation plate and multiple fixation screws are seen within the proximal to mid left humerus. A chronic fracture deformity of the left humeral head is seen with heterotopic bone formation suspected within the region medial to the left humeral neck. There is marked severity narrowing of the glenohumeral articulation. A radiopaque vascular stent is seen overlying the soft tissues medial to the proximal left humeral shaft.  RIGHT SHOULDER 1. Inferior subluxation of the right humeral head with respect to the right glenoid. This may represent sequelae associated with a joint effusion. MRI correlation is recommended. 2. Extensive chronic and postoperative changes, as described above, without definite evidence of acute osseous abnormality  PATIENT SURVEYS:  QuickDash 54.5  COGNITION: Overall cognitive status: Within functional limits for tasks assessed     SENSATION: WFL  POSTURE: Rounded shoulders, kyphotic  UPPER EXTREMITY ROM:   Active ROM Right eval Left eval  Shoulder flexion 50 40 w/pain  Shoulder extension    Shoulder abduction 55 40 w/pain  Shoulder adduction    Shoulder internal rotation T8 S2 w/pain  Shoulder external rotation C5 C2 w/pain  Elbow flexion    Elbow extension    Wrist flexion    Wrist extension    Wrist ulnar deviation    Wrist radial deviation    Wrist pronation    Wrist supination    (Blank rows = not tested)  UPPER EXTREMITY MMT:  MMT Right eval Left eval  Shoulder flexion 2- 2-   Shoulder extension 2- 2-  Shoulder abduction    Shoulder adduction    Shoulder internal rotation 3+ 3+  Shoulder external rotation 3+ 2-  Middle trapezius    Lower trapezius    Elbow flexion    Elbow extension  Wrist flexion    Wrist extension    Wrist ulnar deviation    Wrist radial deviation    Wrist pronation    Wrist supination    Grip strength (lbs)    (Blank rows = not tested)  SHOULDER SPECIAL TESTS: Impingement tests: Painful arc test: positive  Rotator cuff assessment: Drop arm test: positive  and Empty can test: positive   JOINT MOBILITY TESTING:  Can push into further ranges for PROM   PALPATION:  TTP L side                                                                                                                              TREATMENT DATE: 10/02/23- EVAL   PATIENT EDUCATION: Education details: POC, HEP Person educated: Patient Education method: Explanation Education comprehension: verbalized understanding  HOME EXERCISE PROGRAM: Access Code: [email or web address] URL: [email or web address] Date: 10/02/2023 Prepared by: Almetta Fam  Exercises - Supine Shoulder Flexion Extension AAROM with Dowel  - 1 x daily - 7 x weekly - 3 sets - 10 reps - Sidelying Shoulder External Rotation  - 1 x daily - 7 x weekly - 3 sets - 10 reps - Standing Shoulder Extension with Dowel  - 1 x daily - 7 x weekly - 3 sets - 10 reps - Supine Shoulder Abduction AAROM with Dowel  - 1 x daily - 7 x weekly - 3 sets - 10 reps - Isometric Shoulder Flexion at Wall  - 1 x daily - 7 x weekly - 3 sets - 10 reps - 5 hold - Isometric Shoulder External Rotation at Wall  - 1 x daily - 7 x weekly - 3 sets - 10 reps - 5 hold - Standing Isometric Shoulder External Rotation with Doorway  - 1 x daily - 7 x weekly - 3 sets - 10 reps - 5 hold - Isometric Shoulder Extension at Wall  - 1 x daily - 7 x weekly - 3 sets - 10 reps - 5 hold  ASSESSMENT:  CLINICAL IMPRESSION: Patient is a 33  y.o. female who was seen today for physical therapy evaluation and treatment for bilateral shoulder impairments. She has a reverse total shoulder May 2024 and ORIF in the L humerus back in Feb 2022. Pt still has severe ROM deficits especially with flexion and abduction. She reports minimal pain in the R side, but the L one is painful and worse with movements. Her doctor wants to replace the L one but would like her to be in better health overall. Passively she does have a decent amount of motion but is very weak all around. Patient will benefit from skilled PT address her ROM, pain, and strength impairments in bilateral shoulder to allow ease with ADLs.    OBJECTIVE IMPAIRMENTS: decreased ROM, decreased strength, impaired UE functional use, improper body mechanics, postural dysfunction, and pain.   ACTIVITY LIMITATIONS: carrying, dressing, reach over head, and hygiene/grooming  PARTICIPATION LIMITATIONS: cleaning, laundry, shopping,  and community activity  PERSONAL FACTORS: Past/current experiences and Time since onset of injury/illness/exacerbation are also affecting patient's functional outcome.   REHAB POTENTIAL: Fair    CLINICAL DECISION MAKING: Evolving/moderate complexity  EVALUATION COMPLEXITY: Low  GOALS: Goals reviewed with patient? Yes  SHORT TERM GOALS: Target date: 11/13/23  Patient will be independent with initial HEP.  Baseline:  Goal status: INITIAL  2.  Patient will be able to demonstrate full PROM  Baseline:  Goal status: INITIAL   LONG TERM GOALS: Target date: 12/25/23  Patient will be independent with advanced/ongoing HEP to improve outcomes and carryover.  Baseline:  Goal status: INITIAL  2.  Patient will report 50% improvement in L shoulder pain to improve QOL.  Baseline: 6/10 Goal status: INITIAL  3.  Patient to improve bilateral shoulder AROM to 100d to allow for increased ease of ADLs.  Baseline:  Goal status: INITIAL  4.  Patient will demonstrate  improved functional UE strength by 1 muscle grade in weak groups. Baseline: see chart Goal status: INITIAL  5.  Patient will report 10 points improvement on QuickDash to demonstrate improved functional ability.  Baseline: 54.5 Goal status: INITIAL   PLAN:  PT FREQUENCY: 2x/week  PT DURATION: 12 weeks  PLANNED INTERVENTIONS: 97110-Therapeutic exercises, 97530- Therapeutic activity, 97112- Neuromuscular re-education, 97535- Self Care, 02859- Manual therapy, 97016- Vasopneumatic device, 20560 (1-2 muscles), 20561 (3+ muscles)- Dry Needling, Patient/Family education, Taping, Joint mobilization, Joint manipulation, Spinal manipulation, Spinal mobilization, Cryotherapy, and Moist heat  PLAN FOR NEXT SESSION: AAROM, PROM, isometrics and strengthening as tolerated    Almetta Fam, PT 10/02/2023, 1:54 PM

## 2023-10-12 ENCOUNTER — Ambulatory Visit: Admitting: Allergy

## 2023-10-16 NOTE — Therapy (Incomplete)
 OUTPATIENT PHYSICAL THERAPY SHOULDER TREATMENT   Patient Name: Anne Robinson MRN: [id number] DOB:[birth year], 33 y.o., female Today's Date: 10/16/2023  END OF SESSION:    Past Medical History:  Diagnosis Date   Angio-edema    HTN (hypertension) 05/15/2016   Hypothyroidism    Lupus    Lupus nephritis (HCC)    Pseudotumor cerebri    Renal disorder    Thyroid disease    Past Surgical History:  Procedure Laterality Date   INSERTION OF DIALYSIS CATHETER     RENAL BIOPSY     SHOULDER SURGERY     VENTRICULAR ATRIAL SHUNT Right 11/27/2018   Codman programmable shunt set at 150   Patient Active Problem List   Diagnosis Date Noted   Rhinoconjunctivitis 08/10/2023   ESRD on dialysis (HCC) 09/15/2021   Idiopathic intracranial hypertension 09/15/2021   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   Anemia of chronic renal failure 09/15/2021   History of DVT (deep vein thrombosis) 09/15/2021   Thrombocytopenia (HCC) 09/15/2021   Hypothyroidism 09/15/2021   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   Complicated migraine 09/14/2021   Cellulitis of chest wall    Acute renal failure (HCC) 01/26/2017   Exacerbation of systemic lupus erythematosus (HCC) 01/26/2017   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   Tachypnea 01/24/2017   Dyspnea 01/24/2017   Macular rash 01/24/2017   Abscess of left axilla 01/24/2017   Abscess of axilla, left 01/23/2017   Hypotension    Sepsis (HCC) 05/15/2016   Elevated troponin 05/15/2016   HTN (hypertension) 05/15/2016   SLE (systemic lupus erythematosus) (HCC) 05/15/2016    PCP: Camie Cools  REFERRING PROVIDER: Froylan Finical  REFERRING DIAG:  [phone] (ICD-10-CM) - Other displaced fracture of upper end of left humerus, initial encounter for closed fracture  Z96.611 (ICD-10-CM) - Presence of right artificial shoulder joint    THERAPY DIAG:  No diagnosis found.  Rationale for Evaluation and Treatment: Rehabilitation  ONSET DATE:  2021-2022  SUBJECTIVE:                                                                                                                                                                                      SUBJECTIVE STATEMENT: I am back for the same stuff. Still trying to get a transplant. They did say that my L shoulder needs to be replaced. But we are waiting for my overall to be better before doing that again.   PERTINENT HISTORY: 08/30/23:  Patient is here today for MRI review. She has left shoulder proximal humerus ORIF. She has hardware in the left proximal humerus; ORIF proximal humerus with Dr. Georgia 04/09/2020- a displaced left proximal humerus fracture .  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 She reports doing well from Right reverse TSA but has not been able to do a lot of physical therapy. Her motion is limited. Her left shoulder now hurts with movement.  We were denied for CT scan.   PAIN:  Are you having pain? Yes: NPRS scale: 6/10 Pain location: L shoulder, R shoulder sometimes hurts but not really a problem Pain description: achy, dull, constant Aggravating factors: picking up heavy items, getting dressed, over head motions  Relieving factors: Tylenol  sometimes   PRECAUTIONS: None  RED FLAGS: None   WEIGHT BEARING RESTRICTIONS: No  FALLS:  Has patient fallen in last 6 months? No  LIVING ENVIRONMENT: Lives with: lives with their family Lives in: House/apartment   PLOF: Independent and Independent with basic ADLs  PATIENT GOALS:strengthen my arms, get as close to full ROM, be able to get dressed easier   NEXT MD VISIT:   OBJECTIVE:  Note: Objective measures were completed at Evaluation unless otherwise noted.  DIAGNOSTIC FINDINGS:  LEFT SHOULDER There is no evidence of an acute fracture  or dislocation. A radiopaque fixation plate and multiple fixation screws are seen within the proximal to mid left humerus. A chronic fracture deformity of the left humeral head is seen with heterotopic bone formation suspected within the region medial to the left humeral neck. There is marked severity narrowing of the glenohumeral articulation. A radiopaque vascular stent is seen overlying the soft tissues medial to the proximal left humeral shaft.  RIGHT SHOULDER 1. Inferior subluxation of the right humeral head with respect to the right glenoid. This may represent sequelae associated with a joint effusion. MRI correlation is recommended. 2. Extensive chronic and postoperative changes, as described above, without definite evidence of acute osseous abnormality  PATIENT SURVEYS:  QuickDash 54.5  COGNITION: Overall cognitive status: Within functional limits for tasks assessed     SENSATION: WFL  POSTURE: Rounded shoulders, kyphotic  UPPER EXTREMITY ROM:   Active ROM Right eval Left eval  Shoulder flexion 50 40 w/pain  Shoulder extension    Shoulder abduction 55 40 w/pain  Shoulder adduction    Shoulder internal rotation T8 S2 w/pain  Shoulder external rotation C5 C2 w/pain  Elbow flexion    Elbow extension    Wrist flexion    Wrist extension    Wrist ulnar deviation    Wrist radial deviation    Wrist pronation    Wrist supination    (Blank rows = not tested)  UPPER EXTREMITY MMT:  MMT Right eval Left eval  Shoulder flexion 2- 2-  Shoulder extension 2- 2-  Shoulder abduction    Shoulder adduction    Shoulder internal rotation 3+ 3+  Shoulder external rotation 3+ 2-  Middle trapezius    Lower trapezius    Elbow flexion    Elbow extension    Wrist flexion    Wrist extension    Wrist ulnar deviation    Wrist radial deviation    Wrist pronation    Wrist supination    Grip strength (lbs)    (Blank rows = not tested)  SHOULDER SPECIAL TESTS: Impingement  tests: Painful arc test: positive  Rotator cuff assessment: Drop arm test: positive  and Empty can test: positive   JOINT MOBILITY TESTING:  Can push into further ranges for  PROM   PALPATION:  TTP L side                                                                                                                              TREATMENT DATE:  10/17/23 Flexion and rotations on pball AAROM with dowel Wall slides Isometrics with ball PROM to bilateral shoulders   10/02/23- EVAL   PATIENT EDUCATION: Education details: POC, HEP Person educated: Patient Education method: Explanation Education comprehension: verbalized understanding  HOME EXERCISE PROGRAM: Access Code: [email or web address] URL: [email or web address] Date: 10/02/2023 Prepared by: Almetta Fam  Exercises - Supine Shoulder Flexion Extension AAROM with Dowel  - 1 x daily - 7 x weekly - 3 sets - 10 reps - Sidelying Shoulder External Rotation  - 1 x daily - 7 x weekly - 3 sets - 10 reps - Standing Shoulder Extension with Dowel  - 1 x daily - 7 x weekly - 3 sets - 10 reps - Supine Shoulder Abduction AAROM with Dowel  - 1 x daily - 7 x weekly - 3 sets - 10 reps - Isometric Shoulder Flexion at Wall  - 1 x daily - 7 x weekly - 3 sets - 10 reps - 5 hold - Isometric Shoulder External Rotation at Wall  - 1 x daily - 7 x weekly - 3 sets - 10 reps - 5 hold - Standing Isometric Shoulder External Rotation with Doorway  - 1 x daily - 7 x weekly - 3 sets - 10 reps - 5 hold - Isometric Shoulder Extension at Wall  - 1 x daily - 7 x weekly - 3 sets - 10 reps - 5 hold  ASSESSMENT:  CLINICAL IMPRESSION: Patient is a 33 y.o. female who was seen today for physical therapy evaluation and treatment for bilateral shoulder impairments. She has a reverse total shoulder May 2024 and ORIF in the L humerus back in Feb 2022. Pt still has severe ROM deficits especially with flexion and abduction. She reports minimal pain in the R side, but the L  one is painful and worse with movements. Her doctor wants to replace the L one but would like her to be in better health overall. Passively she does have a decent amount of motion but is very weak all around. Patient will benefit from skilled PT address her ROM, pain, and strength impairments in bilateral shoulder to allow ease with ADLs.    OBJECTIVE IMPAIRMENTS: decreased ROM, decreased strength, impaired UE functional use, improper body mechanics, postural dysfunction, and pain.   ACTIVITY LIMITATIONS: carrying, dressing, reach over head, and hygiene/grooming  PARTICIPATION LIMITATIONS: cleaning, laundry, shopping, and community activity  PERSONAL FACTORS: Past/current experiences and Time since onset of injury/illness/exacerbation are also affecting patient's functional outcome.   REHAB POTENTIAL: Fair    CLINICAL DECISION MAKING: Evolving/moderate complexity  EVALUATION COMPLEXITY: Low  GOALS: Goals reviewed with patient? Yes  SHORT TERM GOALS: Target date: 11/13/23  Patient will be independent with initial  HEP.  Baseline:  Goal status: INITIAL  2.  Patient will be able to demonstrate full PROM  Baseline:  Goal status: INITIAL   LONG TERM GOALS: Target date: 12/25/23  Patient will be independent with advanced/ongoing HEP to improve outcomes and carryover.  Baseline:  Goal status: INITIAL  2.  Patient will report 50% improvement in L shoulder pain to improve QOL.  Baseline: 6/10 Goal status: INITIAL  3.  Patient to improve bilateral shoulder AROM to 100d to allow for increased ease of ADLs.  Baseline:  Goal status: INITIAL  4.  Patient will demonstrate improved functional UE strength by 1 muscle grade in weak groups. Baseline: see chart Goal status: INITIAL  5.  Patient will report 10 points improvement on QuickDash to demonstrate improved functional ability.  Baseline: 54.5 Goal status: INITIAL   PLAN:  PT FREQUENCY: 2x/week  PT DURATION: 12  weeks  PLANNED INTERVENTIONS: 97110-Therapeutic exercises, 97530- Therapeutic activity, 97112- Neuromuscular re-education, 97535- Self Care, 02859- Manual therapy, 97016- Vasopneumatic device, 20560 (1-2 muscles), 20561 (3+ muscles)- Dry Needling, Patient/Family education, Taping, Joint mobilization, Joint manipulation, Spinal manipulation, Spinal mobilization, Cryotherapy, and Moist heat  PLAN FOR NEXT SESSION: AAROM, PROM, isometrics and strengthening as tolerated    Almetta Fam, PT 10/16/2023, 2:15 PM

## 2023-10-17 ENCOUNTER — Ambulatory Visit

## 2023-10-19 ENCOUNTER — Ambulatory Visit: Admitting: Physical Therapy

## 2023-10-24 ENCOUNTER — Ambulatory Visit: Admitting: Physical Therapy

## 2023-10-26 ENCOUNTER — Ambulatory Visit

## 2023-10-31 ENCOUNTER — Ambulatory Visit

## 2023-11-02 ENCOUNTER — Ambulatory Visit

## 2023-11-07 ENCOUNTER — Ambulatory Visit: Admitting: Physical Therapy

## 2023-11-09 ENCOUNTER — Ambulatory Visit: Admitting: Physical Therapy

## 2023-12-22 ENCOUNTER — Ambulatory Visit: Admitting: Neurosurgery

## 2023-12-22 ENCOUNTER — Encounter: Payer: Self-pay | Admitting: Neurosurgery

## 2023-12-22 VITALS — BP 122/72 | HR 74 | Ht 59.0 in | Wt 186.8 lb

## 2023-12-22 DIAGNOSIS — Z982 Presence of cerebrospinal fluid drainage device: Secondary | ICD-10-CM

## 2023-12-22 DIAGNOSIS — G932 Benign intracranial hypertension: Secondary | ICD-10-CM | POA: Diagnosis not present

## 2023-12-22 NOTE — Progress Notes (Signed)
 33 year old lady with idiopathic intracranial hypertension with a ventriculoperitoneal shunt placed in 2020.  She is having headaches again.  She requested the shunt to be programmed again.  I programmed her to the setting of 120.  I will see her back in a couple weeks.

## 2024-01-09 MED ORDER — CARBINOXAMINE MALEATE 4 MG PO TABS: ORAL_TABLET | ORAL | 0 refills | Status: AC

## 2024-01-09 NOTE — Telephone Encounter (Signed)
 If denied again, can you please ask if she wants to pay the cost or change to a different antihistamine please. Thank you

## 2024-01-09 NOTE — Addendum Note (Signed)
 Addended by: NANCEE JON SAILOR on: 01/09/2024 09:26 AM   Modules accepted: Orders

## 2024-01-10 NOTE — Telephone Encounter (Signed)
 Thank you :)

## 2024-01-10 NOTE — Telephone Encounter (Signed)
 I spoke with pharmacy and was informed that she used [facility] and paid for the medication.

## 2024-01-18 ENCOUNTER — Ambulatory Visit: Attending: Nurse Practitioner

## 2024-01-18 DIAGNOSIS — Z96611 Presence of right artificial shoulder joint: Secondary | ICD-10-CM | POA: Diagnosis present

## 2024-01-18 DIAGNOSIS — M25611 Stiffness of right shoulder, not elsewhere classified: Secondary | ICD-10-CM | POA: Diagnosis present

## 2024-01-18 DIAGNOSIS — M25512 Pain in left shoulder: Secondary | ICD-10-CM | POA: Diagnosis present

## 2024-01-18 DIAGNOSIS — M6281 Muscle weakness (generalized): Secondary | ICD-10-CM | POA: Diagnosis present

## 2024-01-18 DIAGNOSIS — G8929 Other chronic pain: Secondary | ICD-10-CM | POA: Diagnosis present

## 2024-01-18 DIAGNOSIS — M25612 Stiffness of left shoulder, not elsewhere classified: Secondary | ICD-10-CM | POA: Diagnosis present

## 2024-01-18 DIAGNOSIS — R29898 Other symptoms and signs involving the musculoskeletal system: Secondary | ICD-10-CM | POA: Diagnosis present

## 2024-01-18 NOTE — Therapy (Signed)
 OUTPATIENT PHYSICAL THERAPY SHOULDER EVALUATION   Patient Name: Anne Robinson MRN: [id number] DOB:[birth year], 33 y.o., female Today's Date: 01/18/2024  END OF SESSION:  PT End of Session - 01/18/24 1508     Visit Number 1    Date for Recertification  04/11/24    Authorization Type [employer]    PT Start Time 1508    PT Stop Time 1545    PT Time Calculation (min) 37 min          Past Medical History:  Diagnosis Date   Angio-edema    HTN (hypertension) 05/15/2016   Hypothyroidism    Lupus    Lupus nephritis (HCC)    Pseudotumor cerebri    Renal disorder    Thyroid disease    Past Surgical History:  Procedure Laterality Date   INSERTION OF DIALYSIS CATHETER     RENAL BIOPSY     SHOULDER SURGERY     VENTRICULAR ATRIAL SHUNT Right 11/27/2018   Codman programmable shunt set at 150   Patient Active Problem List   Diagnosis Date Noted   Rhinoconjunctivitis 08/10/2023   ESRD on dialysis (HCC) 09/15/2021   Idiopathic intracranial hypertension 09/15/2021   Immunosuppression due to chronic steroid use 09/15/2021   Anemia of chronic renal failure 09/15/2021   History of DVT (deep vein thrombosis) 09/15/2021   Thrombocytopenia 09/15/2021   Hypothyroidism 09/15/2021   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   Complicated migraine 09/14/2021   Cellulitis of chest wall    Acute renal failure 01/26/2017   Exacerbation of systemic lupus erythematosus (HCC) 01/26/2017   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   Tachypnea 01/24/2017   Dyspnea 01/24/2017   Macular rash 01/24/2017   Abscess of left axilla 01/24/2017   Abscess of axilla, left 01/23/2017   Hypotension    Sepsis (HCC) 05/15/2016   Elevated troponin 05/15/2016   HTN (hypertension) 05/15/2016   SLE (systemic lupus erythematosus) (HCC) 05/15/2016    PCP: Camie Cools  REFERRING PROVIDER: Froylan Finical  REFERRING DIAG:  [phone] (ICD-10-CM) - Other displaced fracture of upper end of left humerus, initial encounter for closed fracture   Z96.611 (ICD-10-CM) - Presence of right artificial shoulder joint    THERAPY DIAG:  Stiffness of left shoulder, not elsewhere classified  Chronic left shoulder pain  Muscle weakness (generalized)  Rationale for Evaluation and Treatment: Rehabilitation  ONSET DATE: chronic  SUBJECTIVE:                                                                                                                                                                                      SUBJECTIVE STATEMENT: I was supposed to come back after the eval last time.  But then I had a blood clot in my fistula and then a DVT in my R leg. I was in the hospital for a month. The L shoulder is sore and throbbing, I have sharp pain. The R side is okay but the ROM is still very limited.    PERTINENT HISTORY: She has left shoulder proximal humerus ORIF. She has hardware in the left proximal humerus; ORIF proximal humerus with Dr. Georgia 04/09/2020- a displaced left proximal humerus fracture . 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 She had Right reverse TSA but was not been able to do a lot of physical therapy. Her motion is limited. Her left shoulder still hurts, was supposed to get surgery.   1. Thrombosis of arteriovenous fistula, subsequent encounter  2. Hematoma of left thigh, subsequent encounter  3. Clotting disorder (CMD)  4. History of thrombectomy  5. Adrenal insufficiency (CMD)  6. Acquired hypothyroidism  7. Essential hypertension  8. Stress-induced cardiomyopathy  9. Pseudotumor cerebri syndrome  10. Seizure disorder (HCC)  11. ESRD on hemodialysis (HCC)  12. Glomerular disease in systemic lupus erythematosus (HCC)  13. Lupus nephritis, ISN/RPS class IV (HCC)  14. Anemia of chronic renal failure, stage 5 (HCC)  15. History of  pulmonary embolus (PE)  16. Chronic lupus erythematosus  17. Hypoalbuminemia  18. Impaired gait and mobility  19. History of ITP  20. 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 Mixed hyperlipidemia  22. Immunosuppressed status (HCC)   PAIN:  Are you having pain? Yes: NPRS scale: 6/10 Pain location: L shoulder Pain description: throbbing, sharp Aggravating factors: lifting, reaching overhead, laying on it the wrong way Relieving factors: just take muscle relaxer at night  PRECAUTIONS: None  RED FLAGS: None   WEIGHT BEARING RESTRICTIONS: No  FALLS:  Has patient fallen in last 6 months? No  LIVING ENVIRONMENT: Lives with: lives with their family Lives in: House/apartment  OCCUPATION: On disability   PLOF: Independent with basic ADLs  PATIENT GOALS:to be able to get more range and be stronger. Eventually want to be able to drive again   NEXT MD VISIT:   OBJECTIVE:  Note: Objective measures were completed at Evaluation unless otherwise noted.  COGNITION: Overall cognitive status: Within functional limits for tasks assessed     SENSATION: WFL  POSTURE: Rounded shoulders  UPPER EXTREMITY ROM:   Active ROM Right eval Left eval  Shoulder flexion 70 70 with pain  Shoulder extension    Shoulder abduction 58 with pain 53 with pain  Shoulder adduction    Shoulder internal rotation T7 T7  Shoulder external rotation C5 C5 with pain  Elbow flexion    Elbow extension    Wrist flexion    Wrist extension    Wrist ulnar deviation    Wrist radial deviation    Wrist pronation    Wrist supination    (Blank rows = not tested)  UPPER EXTREMITY MMT:  MMT Right eval Left eval  Shoulder flexion 2-  2- with pain  Shoulder extension    Shoulder abduction 2- 2- with pain  Shoulder adduction    Shoulder internal rotation 4- 4-  Shoulder external rotation 3 2- with pain  Middle trapezius    Lower trapezius    Elbow flexion    Elbow extension    Wrist flexion  Wrist  extension    Wrist ulnar deviation    Wrist radial deviation    Wrist pronation    Wrist supination    Grip strength (lbs)    (Blank rows = not tested)  SHOULDER SPECIAL TESTS: Impingement tests: Painful arc test: positive  Rotator cuff assessment: Drop arm test: positive  and Empty can test: positive   JOINT MOBILITY TESTING:  Can push into further ranges for PROM, but still not full range  PALPATION:  TTP L shoulder                                                                                                                             TREATMENT DATE: 01/18/24- EVAL   PATIENT EDUCATION: Education details: POC, HEP review Person educated: Patient Education method: Medical Illustrator Education comprehension: verbalized understanding  HOME EXERCISE PROGRAM: Access Code: [email or web address] URL: [email or web address] Date: 01/18/2024 Prepared by: Almetta Fam  Exercises - Supine Shoulder Flexion Extension AAROM with Dowel  - 1 x daily - 7 x weekly - 3 sets - 10 reps - Sidelying Shoulder External Rotation  - 1 x daily - 7 x weekly - 3 sets - 10 reps - Standing Shoulder Extension with Dowel  - 1 x daily - 7 x weekly - 3 sets - 10 reps - Supine Shoulder Abduction AAROM with Dowel  - 1 x daily - 7 x weekly - 3 sets - 10 reps - Isometric Shoulder Flexion at Wall  - 1 x daily - 7 x weekly - 3 sets - 10 reps - 5 hold - Isometric Shoulder External Rotation at Wall  - 1 x daily - 7 x weekly - 3 sets - 10 reps - 5 hold - Isometric Shoulder Extension at Wall  - 1 x daily - 7 x weekly - 3 sets - 10 reps - 5 hold  ASSESSMENT:  CLINICAL IMPRESSION: Patient is a 33 y.o. female who was seen today for physical therapy evaluation and treatment for bilateral shoulder impairments. Her left side gives her more trouble than the R. She had a reverse total on the R in May of 2024 and ORIF in the L humerus back in Feb 2022. Her doctors suggested surgery for the L side but are  holding off until she is in better health overall, and possibly after getting a kidney transplant for which she is still on the wait list. Patient has significant ROM deficits mainly with flexion, abduction, and external rotation on both sides. Passively she is able to demonstrate further ranges into flexion and abduction but is lacking muscle strength overall to get decent ranges on her own. She will benefit from PT to address her pain, range, strength impairments to allow more ease with ADLs and household activities.   OBJECTIVE IMPAIRMENTS: decreased ROM, decreased strength, impaired UE functional use, improper body mechanics, postural dysfunction, and pain.   ACTIVITY LIMITATIONS: carrying, lifting, reach over head, and hygiene/grooming  PARTICIPATION LIMITATIONS: cleaning,  driving, shopping, and community activity  PERSONAL FACTORS: Past/current experiences and Time since onset of injury/illness/exacerbation are also affecting patient's functional outcome.   REHAB POTENTIAL: Fair    CLINICAL DECISION MAKING: Evolving/moderate complexity  EVALUATION COMPLEXITY: Low  GOALS: Goals reviewed with patient? Yes   SHORT TERM GOALS: Target date: 02/29/24   Patient will be independent with initial HEP.  Baseline:  Goal status: INITIAL   2.  Patient will be able to demonstrate full PROM  Baseline:  Goal status: INITIAL     LONG TERM GOALS: Target date: 04/11/24   Patient will be independent with advanced/ongoing HEP to improve outcomes and carryover.  Baseline:  Goal status: INITIAL   2.  Patient will report 50% improvement in L shoulder pain to improve QOL.  Baseline: 6/10 Goal status: INITIAL   3.  Patient to improve bilateral active shoulder flexion and abduction to 100d or better to allow for increased ease of ADLs.  Baseline:  Goal status: INITIAL   4.  Patient will demonstrate improved functional UE strength by 1 muscle grade in weak groups. Baseline: see chart Goal  status: INITIAL  PLAN:  PT FREQUENCY: 2x/week  PT DURATION: 12 weeks  PLANNED INTERVENTIONS: 97110-Therapeutic exercises, 97530- Therapeutic activity, 97112- Neuromuscular re-education, 97535- Self Care, 02859- Manual therapy, 20560 (1-2 muscles), 20561 (3+ muscles)- Dry Needling, Patient/Family education, Taping, Joint mobilization, Spinal mobilization, Cryotherapy, and Moist heat  PLAN FOR NEXT SESSION: AAROM, PROM, isometrics and strengthening as tolerated    Almetta Fam, PT 01/18/2024, 3:53 PM

## 2024-01-23 ENCOUNTER — Ambulatory Visit: Admitting: Neurosurgery

## 2024-01-25 ENCOUNTER — Ambulatory Visit

## 2024-01-25 DIAGNOSIS — G8929 Other chronic pain: Secondary | ICD-10-CM

## 2024-01-25 DIAGNOSIS — M6281 Muscle weakness (generalized): Secondary | ICD-10-CM

## 2024-01-25 DIAGNOSIS — M25612 Stiffness of left shoulder, not elsewhere classified: Secondary | ICD-10-CM | POA: Diagnosis not present

## 2024-01-25 DIAGNOSIS — R29898 Other symptoms and signs involving the musculoskeletal system: Secondary | ICD-10-CM

## 2024-01-25 DIAGNOSIS — Z96611 Presence of right artificial shoulder joint: Secondary | ICD-10-CM

## 2024-01-25 DIAGNOSIS — M25611 Stiffness of right shoulder, not elsewhere classified: Secondary | ICD-10-CM

## 2024-01-25 NOTE — Therapy (Signed)
 OUTPATIENT PHYSICAL THERAPY SHOULDER TREATMENT   Patient Name: Anne Robinson MRN: [id number] DOB:1990-08-25, 33 y.o., female Today's Date: 01/25/2024  END OF SESSION:  PT End of Session - 01/25/24 1749     Visit Number 2    Date for Recertification  04/11/24    Authorization Type [employer]    PT Start Time 1750    PT Stop Time 1835    PT Time Calculation (min) 45 min           Past Medical History:  Diagnosis Date   Angio-edema    HTN (hypertension) 05/15/2016   Hypothyroidism    Lupus    Lupus nephritis (HCC)    Pseudotumor cerebri    Renal disorder    Thyroid disease    Past Surgical History:  Procedure Laterality Date   INSERTION OF DIALYSIS CATHETER     RENAL BIOPSY     SHOULDER SURGERY     VENTRICULAR ATRIAL SHUNT Right 11/27/2018   Codman programmable shunt set at 150   Patient Active Problem List   Diagnosis Date Noted   Rhinoconjunctivitis 08/10/2023   ESRD on dialysis (HCC) 09/15/2021   Idiopathic intracranial hypertension 09/15/2021   Immunosuppression due to chronic steroid use 09/15/2021   Anemia of chronic renal failure 09/15/2021   History of DVT (deep vein thrombosis) 09/15/2021   Thrombocytopenia 09/15/2021   Hypothyroidism 09/15/2021   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   Complicated migraine 09/14/2021   Cellulitis of chest wall    Acute renal failure 01/26/2017   Exacerbation of systemic lupus erythematosus (HCC) 01/26/2017   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   Tachypnea 01/24/2017   Dyspnea 01/24/2017   Macular rash 01/24/2017   Abscess of left axilla 01/24/2017   Abscess of axilla, left 01/23/2017   Hypotension    Sepsis (HCC) 05/15/2016   Elevated troponin 05/15/2016   HTN (hypertension) 05/15/2016   SLE (systemic lupus erythematosus) (HCC) 05/15/2016    PCP: Camie Cools  REFERRING PROVIDER: Froylan Finical  REFERRING DIAG:  [phone] (ICD-10-CM) - Other displaced fracture of upper end of left humerus, initial encounter for closed fracture   Z96.611 (ICD-10-CM) - Presence of right artificial shoulder joint    THERAPY DIAG:  Stiffness of left shoulder, not elsewhere classified  Chronic left shoulder pain  Muscle weakness (generalized)  Stiffness of right shoulder, not elsewhere classified  Status post reverse total replacement of right shoulder  Other symptoms and signs involving the musculoskeletal system  Rationale for Evaluation and Treatment: Rehabilitation  ONSET DATE: chronic  SUBJECTIVE:  SUBJECTIVE STATEMENT: No change since eval. Pain is still there.   PERTINENT HISTORY: She has left shoulder proximal humerus ORIF. She has hardware in the left proximal humerus; ORIF proximal humerus with Dr. Georgia 04/09/2020- a displaced left proximal humerus fracture . 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 She had Right reverse TSA but was not been able to do a lot of physical therapy. Her motion is limited. Her left shoulder still hurts, was supposed to get surgery.   1. Thrombosis of arteriovenous fistula, subsequent encounter  2. Hematoma of left thigh, subsequent encounter  3. Clotting disorder (CMD)  4. History of thrombectomy  5. Adrenal insufficiency (CMD)  6. Acquired hypothyroidism  7. Essential hypertension  8. Stress-induced cardiomyopathy  9. Pseudotumor cerebri syndrome  10. Seizure disorder (HCC)  11. ESRD on hemodialysis (HCC)  12. Glomerular disease in systemic lupus erythematosus (HCC)  13. Lupus nephritis, ISN/RPS class IV (HCC)  14. Anemia of chronic renal failure, stage 5 (HCC)  15. History of pulmonary embolus (PE)  16. Chronic lupus  erythematosus  17. Hypoalbuminemia  18. Impaired gait and mobility  19. History of ITP  20. 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 Mixed hyperlipidemia  22. Immunosuppressed status (HCC)   PAIN:  Are you having pain? Yes: NPRS scale: 6/10 Pain location: L shoulder Pain description: throbbing, sharp Aggravating factors: lifting, reaching overhead, laying on it the wrong way Relieving factors: just take muscle relaxer at night  PRECAUTIONS: None  RED FLAGS: None   WEIGHT BEARING RESTRICTIONS: No  FALLS:  Has patient fallen in last 6 months? No  LIVING ENVIRONMENT: Lives with: lives with their family Lives in: House/apartment  OCCUPATION: On disability   PLOF: Independent with basic ADLs  PATIENT GOALS:to be able to get more range and be stronger. Eventually want to be able to drive again   NEXT MD VISIT:   OBJECTIVE:  Note: Objective measures were completed at Evaluation unless otherwise noted.  COGNITION: Overall cognitive status: Within functional limits for tasks assessed     SENSATION: WFL  POSTURE: Rounded shoulders  UPPER EXTREMITY ROM:   Active ROM Right eval Left eval  Shoulder flexion 70 70 with pain  Shoulder extension    Shoulder abduction 58 with pain 53 with pain  Shoulder adduction    Shoulder internal rotation T7 T7  Shoulder external rotation C5 C5 with pain  Elbow flexion    Elbow extension    Wrist flexion    Wrist extension    Wrist ulnar deviation    Wrist radial deviation    Wrist pronation    Wrist supination    (Blank rows = not tested)  UPPER EXTREMITY MMT:  MMT Right eval Left eval  Shoulder flexion 2-  2- with pain  Shoulder extension    Shoulder abduction 2- 2- with pain  Shoulder adduction    Shoulder internal rotation 4- 4-  Shoulder external rotation 3 2- with pain  Middle trapezius    Lower trapezius    Elbow flexion    Elbow extension    Wrist flexion    Wrist extension    Wrist ulnar deviation    Wrist  radial deviation    Wrist pronation    Wrist supination    Grip strength (lbs)    (Blank rows = not tested)  SHOULDER SPECIAL TESTS: Impingement tests: Painful arc test: positive  Rotator cuff assessment: Drop arm test: positive  and Empty can test: positive   JOINT MOBILITY TESTING:  Can push into further ranges for PROM, but still not full range  PALPATION:  TTP L shoulder                                                                                                                             TREATMENT DATE:  01/25/24 AAROM with dowel flexion, ext, IR 2x10 Isometrics against PT resistance all directions 5s holds x10 bilaterally  Wall slides x10 PROM bilateral shoulders  Supine chest press 2# 2x10 Yellow band rows and ext 2x10   01/18/24- EVAL   PATIENT EDUCATION: Education details: POC, HEP review Person educated: Patient Education method: Explanation and Demonstration Education comprehension: verbalized understanding  HOME EXERCISE PROGRAM: Access Code: [email or web address] URL: [email or web address] Date: 01/18/2024 Prepared by: Almetta Fam  Exercises - Supine Shoulder Flexion Extension AAROM with Dowel  - 1 x daily - 7 x weekly - 3 sets - 10 reps - Sidelying Shoulder External Rotation  - 1 x daily - 7 x weekly - 3 sets - 10 reps - Standing Shoulder Extension with Dowel  - 1 x daily - 7 x weekly - 3 sets - 10 reps - Supine Shoulder Abduction AAROM with Dowel  - 1 x daily - 7 x weekly - 3 sets - 10 reps - Isometric Shoulder Flexion at Wall  - 1 x daily - 7 x weekly - 3 sets - 10 reps - 5 hold - Isometric Shoulder External Rotation at Wall  - 1 x daily - 7 x weekly - 3 sets - 10 reps - 5 hold - Isometric Shoulder Extension at Wall  - 1 x daily - 7 x weekly - 3 sets - 10 reps - 5 hold  ASSESSMENT:  CLINICAL IMPRESSION: Patient returns with no changes since eval. She has not been compliant with HEP. We focused mostly on ROM and light strengthening today. Pain  limits her ability to do some exercises but she gives forth a good effort. She is still very limited with mobility bilaterally. Pt is able to get into further ranges with PROM but still quite limited. She will benefit from PT to address her pain, range, strength impairments to allow more ease with ADLs and household activities.   Patient is a 33 y.o. female who was seen today for physical therapy evaluation and treatment for bilateral shoulder impairments. Her left side gives her more trouble than the R. She had a reverse total on the R in May of 2024 and ORIF in the L humerus back in Feb 2022. Her doctors suggested surgery for the L side but are holding off until she is in better health overall, and possibly after getting a kidney transplant for which she is still on the wait list. Patient has significant ROM deficits mainly with flexion, abduction, and external rotation  on both sides. Passively she is able to demonstrate further ranges into flexion and abduction but is lacking muscle strength overall to get decent ranges on her own.   OBJECTIVE IMPAIRMENTS: decreased ROM, decreased strength, impaired UE functional use, improper body mechanics, postural dysfunction, and pain.   ACTIVITY LIMITATIONS: carrying, lifting, reach over head, and hygiene/grooming  PARTICIPATION LIMITATIONS: cleaning, driving, shopping, and community activity  PERSONAL FACTORS: Past/current experiences and Time since onset of injury/illness/exacerbation are also affecting patient's functional outcome.   REHAB POTENTIAL: Fair    CLINICAL DECISION MAKING: Evolving/moderate complexity  EVALUATION COMPLEXITY: Low  GOALS: Goals reviewed with patient? Yes   SHORT TERM GOALS: Target date: 02/29/24   Patient will be independent with initial HEP.  Baseline:  Goal status: INITIAL   2.  Patient will be able to demonstrate full PROM  Baseline:  Goal status: INITIAL     LONG TERM GOALS: Target date: 04/11/24   Patient  will be independent with advanced/ongoing HEP to improve outcomes and carryover.  Baseline:  Goal status: INITIAL   2.  Patient will report 50% improvement in L shoulder pain to improve QOL.  Baseline: 6/10 Goal status: INITIAL   3.  Patient to improve bilateral active shoulder flexion and abduction to 100d or better to allow for increased ease of ADLs.  Baseline:  Goal status: INITIAL   4.  Patient will demonstrate improved functional UE strength by 1 muscle grade in weak groups. Baseline: see chart Goal status: INITIAL  PLAN:  PT FREQUENCY: 2x/week  PT DURATION: 12 weeks  PLANNED INTERVENTIONS: 97110-Therapeutic exercises, 97530- Therapeutic activity, 97112- Neuromuscular re-education, 97535- Self Care, 02859- Manual therapy, 20560 (1-2 muscles), 20561 (3+ muscles)- Dry Needling, Patient/Family education, Taping, Joint mobilization, Spinal mobilization, Cryotherapy, and Moist heat  PLAN FOR NEXT SESSION: AAROM, PROM, isometrics and strengthening as tolerated    Almetta Fam, PT 01/25/2024, 6:31 PM

## 2024-01-26 ENCOUNTER — Encounter: Payer: Self-pay | Admitting: Neurosurgery

## 2024-01-26 ENCOUNTER — Ambulatory Visit (INDEPENDENT_AMBULATORY_CARE_PROVIDER_SITE_OTHER): Admitting: Neurosurgery

## 2024-01-26 VITALS — BP 107/74 | HR 86 | Temp 97.6°F | Ht 59.0 in | Wt 188.2 lb

## 2024-01-26 DIAGNOSIS — G932 Benign intracranial hypertension: Secondary | ICD-10-CM

## 2024-01-26 DIAGNOSIS — Z982 Presence of cerebrospinal fluid drainage device: Secondary | ICD-10-CM

## 2024-01-26 NOTE — Progress Notes (Signed)
 33 year old lady with idiopathic intracranial hypertension who is on dialysis.  Recently we dialed her shunt to a setting of 120.  She returned and says that she is doing much better.  She does not have any headaches anymore.  We decided that she is going to call me if there is anything I can do for her.

## 2024-02-06 ENCOUNTER — Encounter: Payer: Self-pay | Admitting: Physical Therapy

## 2024-02-06 ENCOUNTER — Ambulatory Visit: Admitting: Physical Therapy

## 2024-02-06 DIAGNOSIS — R29898 Other symptoms and signs involving the musculoskeletal system: Secondary | ICD-10-CM | POA: Insufficient documentation

## 2024-02-06 DIAGNOSIS — M6281 Muscle weakness (generalized): Secondary | ICD-10-CM | POA: Diagnosis present

## 2024-02-06 DIAGNOSIS — Z96611 Presence of right artificial shoulder joint: Secondary | ICD-10-CM | POA: Diagnosis present

## 2024-02-06 DIAGNOSIS — M25512 Pain in left shoulder: Secondary | ICD-10-CM | POA: Insufficient documentation

## 2024-02-06 DIAGNOSIS — M25612 Stiffness of left shoulder, not elsewhere classified: Secondary | ICD-10-CM | POA: Diagnosis present

## 2024-02-06 DIAGNOSIS — M25611 Stiffness of right shoulder, not elsewhere classified: Secondary | ICD-10-CM | POA: Diagnosis present

## 2024-02-06 DIAGNOSIS — G8929 Other chronic pain: Secondary | ICD-10-CM | POA: Diagnosis present

## 2024-02-06 DIAGNOSIS — M25511 Pain in right shoulder: Secondary | ICD-10-CM | POA: Insufficient documentation

## 2024-02-06 NOTE — Therapy (Signed)
 OUTPATIENT PHYSICAL THERAPY SHOULDER TREATMENT   Patient Name: Anne Robinson MRN: [id number] DOB:1990/03/31, 33 y.o., female Today's Date: 02/06/2024  END OF SESSION:  PT End of Session - 02/06/24 1259     Visit Number 3    Date for Recertification  04/11/24    PT Start Time 1300    PT Stop Time 1345    PT Time Calculation (min) 45 min    Activity Tolerance Patient tolerated treatment well    Behavior During Therapy [facility] for tasks assessed/performed           Past Medical History:  Diagnosis Date   Angio-edema    HTN (hypertension) 05/15/2016   Hypothyroidism    Lupus    Lupus nephritis (HCC)    Pseudotumor cerebri    Renal disorder    Thyroid disease    Past Surgical History:  Procedure Laterality Date   INSERTION OF DIALYSIS CATHETER     RENAL BIOPSY     SHOULDER SURGERY     VENTRICULAR ATRIAL SHUNT Right 11/27/2018   Codman programmable shunt set at 150   Patient Active Problem List   Diagnosis Date Noted   Rhinoconjunctivitis 08/10/2023   ESRD on dialysis (HCC) 09/15/2021   Idiopathic intracranial hypertension 09/15/2021   Immunosuppression due to chronic steroid use 09/15/2021   Anemia of chronic renal failure 09/15/2021   History of DVT (deep vein thrombosis) 09/15/2021   Thrombocytopenia 09/15/2021   Hypothyroidism 09/15/2021   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   Complicated migraine 09/14/2021   Cellulitis of chest wall    Acute renal failure 01/26/2017   Exacerbation of systemic lupus erythematosus (HCC) 01/26/2017   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   Tachypnea 01/24/2017   Dyspnea 01/24/2017   Macular rash 01/24/2017   Abscess of left axilla 01/24/2017   Abscess of axilla, left 01/23/2017   Hypotension    Sepsis (HCC) 05/15/2016   Elevated troponin 05/15/2016   HTN (hypertension) 05/15/2016   SLE (systemic lupus erythematosus) (HCC) 05/15/2016    PCP: Camie Cools  REFERRING PROVIDER: Froylan Finical  REFERRING DIAG:  [id number] (ICD-10-CM) - Other  displaced fracture of upper end of left humerus, initial encounter for closed fracture  Z96.611 (ICD-10-CM) - Presence of right artificial shoulder joint    THERAPY DIAG:  Stiffness of left shoulder, not elsewhere classified  Chronic left shoulder pain  Muscle weakness (generalized)  Status post reverse total replacement of right shoulder  Rationale for Evaluation and Treatment: Rehabilitation  ONSET DATE: chronic  SUBJECTIVE:  SUBJECTIVE STATEMENT: Little tightness in the front of L shoulder   PERTINENT HISTORY: She has left shoulder proximal humerus ORIF. She has hardware in the left proximal humerus; ORIF proximal humerus with Dr. Georgia 04/09/2020- a displaced left proximal humerus fracture . 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 She had Right reverse TSA but was not been able to do a lot of physical therapy. Her motion is limited. Her left shoulder still hurts, was supposed to get surgery.   1. Thrombosis of arteriovenous fistula, subsequent encounter  2. Hematoma of left thigh, subsequent encounter  3. Clotting disorder (CMD)  4. History of thrombectomy  5. Adrenal insufficiency (CMD)  6. Acquired hypothyroidism  7. Essential hypertension  8. Stress-induced cardiomyopathy  9. Pseudotumor cerebri syndrome  10. Seizure disorder (HCC)  11. ESRD on hemodialysis (HCC)  12. Glomerular disease in systemic lupus erythematosus (HCC)  13. Lupus nephritis, ISN/RPS class IV (HCC)  14. Anemia of chronic renal failure, stage 5 (HCC)  15. History of pulmonary embolus (PE)  16. Chronic lupus erythematosus  17. Hypoalbuminemia  18.  Impaired gait and mobility  19. History of ITP  20. 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 Mixed hyperlipidemia  22. Immunosuppressed status (HCC)   PAIN:  Are you having pain? Yes: NPRS scale: 5/10 Pain location: L shoulder Pain description: throbbing, sharp Aggravating factors: lifting, reaching overhead, laying on it the wrong way Relieving factors: just take muscle relaxer at night  PRECAUTIONS: None  RED FLAGS: None   WEIGHT BEARING RESTRICTIONS: No  FALLS:  Has patient fallen in last 6 months? No  LIVING ENVIRONMENT: Lives with: lives with their family Lives in: House/apartment  OCCUPATION: On disability   PLOF: Independent with basic ADLs  PATIENT GOALS:to be able to get more range and be stronger. Eventually want to be able to drive again   NEXT MD VISIT:   OBJECTIVE:  Note: Objective measures were completed at Evaluation unless otherwise noted.  COGNITION: Overall cognitive status: Within functional limits for tasks assessed     SENSATION: WFL  POSTURE: Rounded shoulders  UPPER EXTREMITY ROM:   Active ROM Right eval Left eval  Shoulder flexion 70 70 with pain  Shoulder extension    Shoulder abduction 58 with pain 53 with pain  Shoulder adduction    Shoulder internal rotation T7 T7  Shoulder external rotation C5 C5 with pain  Elbow flexion    Elbow extension    Wrist flexion    Wrist extension    Wrist ulnar deviation    Wrist radial deviation    Wrist pronation    Wrist supination    (Blank rows = not tested)  UPPER EXTREMITY MMT:  MMT Right eval Left eval  Shoulder flexion 2-  2- with pain  Shoulder extension    Shoulder abduction 2- 2- with pain  Shoulder adduction    Shoulder internal rotation 4- 4-  Shoulder external rotation 3 2- with pain  Middle trapezius    Lower trapezius    Elbow flexion    Elbow extension    Wrist flexion    Wrist extension    Wrist ulnar deviation    Wrist radial deviation    Wrist pronation     Wrist supination    Grip strength (lbs)    (Blank rows = not tested)  SHOULDER SPECIAL TESTS: Impingement tests: Painful arc test: positive  Rotator cuff assessment: Drop arm test: positive  and Empty can test: positive   JOINT MOBILITY TESTING:  Can push into further ranges for PROM, but still not full range  PALPATION:  TTP L shoulder                                                                                                                             TREATMENT DATE:  02/06/24 NuStep L 5 x 6 min AAROM with 1lb WaTE flexion, ext, IR 2x10 Rows & Ext green 2x10 Shoulder abd & flex 2x10 AROM ER/IR 2x10 Bilateral Shoulder PROM with end range holds  01/25/24 AAROM with dowel flexion, ext, IR 2x10 Isometrics against PT resistance all directions 5s holds x10 bilaterally  Wall slides x10 PROM bilateral shoulders  Supine chest press 2# 2x10 Yellow band rows and ext 2x10   01/18/24- EVAL   PATIENT EDUCATION: Education details: POC, HEP review Person educated: Patient Education method: Explanation and Demonstration Education comprehension: verbalized understanding  HOME EXERCISE PROGRAM: Access Code: [email or web address] URL: [email or web address] Date: 01/18/2024 Prepared by: Almetta Fam  Exercises - Supine Shoulder Flexion Extension AAROM with Dowel  - 1 x daily - 7 x weekly - 3 sets - 10 reps - Sidelying Shoulder External Rotation  - 1 x daily - 7 x weekly - 3 sets - 10 reps - Standing Shoulder Extension with Dowel  - 1 x daily - 7 x weekly - 3 sets - 10 reps - Supine Shoulder Abduction AAROM with Dowel  - 1 x daily - 7 x weekly - 3 sets - 10 reps - Isometric Shoulder Flexion at Wall  - 1 x daily - 7 x weekly - 3 sets - 10 reps - 5 hold - Isometric Shoulder External Rotation at Wall  - 1 x daily - 7 x weekly - 3 sets - 10 reps - 5 hold - Isometric Shoulder Extension at Wall  - 1 x daily - 7 x weekly - 3 sets - 10 reps - 5 hold  ASSESSMENT:  CLINICAL  IMPRESSION: Continued focused on ROM and light strengthening today. Weakness limited her ability with raise shoulders.  She is still very limited with mobility bilaterally. Pt is able to get into further ranges with PROM. R shoulder elevation noted with rows and extensions.  She will benefit from PT to address her pain, range, strength impairments to allow more ease with ADLs and household activities.   Patient is a 33 y.o. female who was seen today for physical therapy evaluation and treatment for bilateral shoulder impairments. Her left side gives her more trouble than the R. She had a reverse total on the R in May of 2024 and ORIF in the L humerus back in Feb 2022. Her doctors suggested surgery for the L side but are holding off until she is in better health overall, and possibly after getting a kidney transplant for which  she is still on the wait list. Patient has significant ROM deficits mainly with flexion, abduction, and external rotation on both sides. Passively she is able to demonstrate further ranges into flexion and abduction but is lacking muscle strength overall to get decent ranges on her own.   OBJECTIVE IMPAIRMENTS: decreased ROM, decreased strength, impaired UE functional use, improper body mechanics, postural dysfunction, and pain.   ACTIVITY LIMITATIONS: carrying, lifting, reach over head, and hygiene/grooming  PARTICIPATION LIMITATIONS: cleaning, driving, shopping, and community activity  PERSONAL FACTORS: Past/current experiences and Time since onset of injury/illness/exacerbation are also affecting patient's functional outcome.   REHAB POTENTIAL: Fair    CLINICAL DECISION MAKING: Evolving/moderate complexity  EVALUATION COMPLEXITY: Low  GOALS: Goals reviewed with patient? Yes   SHORT TERM GOALS: Target date: 02/29/24   Patient will be independent with initial HEP.  Baseline:  Goal status: Met 12/2/5   2.  Patient will be able to demonstrate full PROM  Baseline:   Goal status: INITIAL     LONG TERM GOALS: Target date: 04/11/24   Patient will be independent with advanced/ongoing HEP to improve outcomes and carryover.  Baseline:  Goal status: INITIAL   2.  Patient will report 50% improvement in L shoulder pain to improve QOL.  Baseline: 6/10 Goal status: INITIAL   3.  Patient to improve bilateral active shoulder flexion and abduction to 100d or better to allow for increased ease of ADLs.  Baseline:  Goal status: INITIAL   4.  Patient will demonstrate improved functional UE strength by 1 muscle grade in weak groups. Baseline: see chart Goal status: INITIAL  PLAN:  PT FREQUENCY: 2x/week  PT DURATION: 12 weeks  PLANNED INTERVENTIONS: 97110-Therapeutic exercises, 97530- Therapeutic activity, 97112- Neuromuscular re-education, 97535- Self Care, 02859- Manual therapy, 20560 (1-2 muscles), 20561 (3+ muscles)- Dry Needling, Patient/Family education, Taping, Joint mobilization, Spinal mobilization, Cryotherapy, and Moist heat  PLAN FOR NEXT SESSION: AAROM, PROM, isometrics and strengthening as tolerated    Anne Robinson, PTA 02/06/2024, 1:01 PM

## 2024-02-08 ENCOUNTER — Ambulatory Visit: Admitting: Physical Therapy

## 2024-02-08 ENCOUNTER — Encounter: Payer: Self-pay | Admitting: Physical Therapy

## 2024-02-08 DIAGNOSIS — M25612 Stiffness of left shoulder, not elsewhere classified: Secondary | ICD-10-CM

## 2024-02-08 DIAGNOSIS — M6281 Muscle weakness (generalized): Secondary | ICD-10-CM

## 2024-02-08 DIAGNOSIS — M25611 Stiffness of right shoulder, not elsewhere classified: Secondary | ICD-10-CM

## 2024-02-08 DIAGNOSIS — G8929 Other chronic pain: Secondary | ICD-10-CM

## 2024-02-08 DIAGNOSIS — Z96611 Presence of right artificial shoulder joint: Secondary | ICD-10-CM

## 2024-02-08 NOTE — Therapy (Signed)
 OUTPATIENT PHYSICAL THERAPY SHOULDER TREATMENT   Patient Name: Anne Robinson MRN: [id number] DOB:[birth year], 33 y.o., female Today's Date: 02/08/2024  END OF SESSION:  PT End of Session - 02/08/24 1305     Visit Number 4    Date for Recertification  04/11/24    PT Start Time 1300    PT Stop Time 1345    PT Time Calculation (min) 45 min    Activity Tolerance Patient tolerated treatment well    Behavior During Therapy [facility] for tasks assessed/performed           Past Medical History:  Diagnosis Date   Angio-edema    HTN (hypertension) 05/15/2016   Hypothyroidism    Lupus    Lupus nephritis (HCC)    Pseudotumor cerebri    Renal disorder    Thyroid disease    Past Surgical History:  Procedure Laterality Date   INSERTION OF DIALYSIS CATHETER     RENAL BIOPSY     SHOULDER SURGERY     VENTRICULAR ATRIAL SHUNT Right 11/27/2018   Codman programmable shunt set at 150   Patient Active Problem List   Diagnosis Date Noted   Rhinoconjunctivitis 08/10/2023   ESRD on dialysis (HCC) 09/15/2021   Idiopathic intracranial hypertension 09/15/2021   Immunosuppression due to chronic steroid use 09/15/2021   Anemia of chronic renal failure 09/15/2021   History of DVT (deep vein thrombosis) 09/15/2021   Thrombocytopenia 09/15/2021   Hypothyroidism 09/15/2021   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   Complicated migraine 09/14/2021   Cellulitis of chest wall    Acute renal failure 01/26/2017   Exacerbation of systemic lupus erythematosus (HCC) 01/26/2017   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   Tachypnea 01/24/2017   Dyspnea 01/24/2017   Macular rash 01/24/2017   Abscess of left axilla 01/24/2017   Abscess of axilla, left 01/23/2017   Hypotension    Sepsis (HCC) 05/15/2016   Elevated troponin 05/15/2016   HTN (hypertension) 05/15/2016   SLE (systemic lupus erythematosus) (HCC) 05/15/2016    PCP: Camie Cools  REFERRING PROVIDER: Froylan Finical  REFERRING DIAG:  [phone] (ICD-10-CM) - Other  displaced fracture of upper end of left humerus, initial encounter for closed fracture  Z96.611 (ICD-10-CM) - Presence of right artificial shoulder joint    THERAPY DIAG:  Stiffness of left shoulder, not elsewhere classified  Chronic left shoulder pain  Muscle weakness (generalized)  Status post reverse total replacement of right shoulder  Stiffness of right shoulder, not elsewhere classified  Rationale for Evaluation and Treatment: Rehabilitation  ONSET DATE: chronic  SUBJECTIVE:  SUBJECTIVE STATEMENT: Shoulder soreness yesterday when she woke up.   PERTINENT HISTORY: She has left shoulder proximal humerus ORIF. She has hardware in the left proximal humerus; ORIF proximal humerus with Dr. Georgia 04/09/2020- a displaced left proximal humerus fracture . 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 She had Right reverse TSA but was not been able to do a lot of physical therapy. Her motion is limited. Her left shoulder still hurts, was supposed to get surgery.   1. Thrombosis of arteriovenous fistula, subsequent encounter  2. Hematoma of left thigh, subsequent encounter  3. Clotting disorder (CMD)  4. History of thrombectomy  5. Adrenal insufficiency (CMD)  6. Acquired hypothyroidism  7. Essential hypertension  8. Stress-induced cardiomyopathy  9. Pseudotumor cerebri syndrome  10. Seizure disorder (HCC)  11. ESRD on hemodialysis (HCC)  12. Glomerular disease in systemic lupus erythematosus (HCC)  13. Lupus nephritis, ISN/RPS class IV (HCC)  14. Anemia of chronic renal failure, stage 5 (HCC)  15. History of pulmonary embolus (PE)  16.  Chronic lupus erythematosus  17. Hypoalbuminemia  18. Impaired gait and mobility  19. History of ITP  20. 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 Mixed hyperlipidemia  22. Immunosuppressed status (HCC)   PAIN:  Are you having pain? Yes: NPRS scale: 5/10 Pain location: L shoulder Pain description: throbbing, sharp Aggravating factors: lifting, reaching overhead, laying on it the wrong way Relieving factors: just take muscle relaxer at night  PRECAUTIONS: None  RED FLAGS: None   WEIGHT BEARING RESTRICTIONS: No  FALLS:  Has patient fallen in last 6 months? No  LIVING ENVIRONMENT: Lives with: lives with their family Lives in: House/apartment  OCCUPATION: On disability   PLOF: Independent with basic ADLs  PATIENT GOALS:to be able to get more range and be stronger. Eventually want to be able to drive again   NEXT MD VISIT:   OBJECTIVE:  Note: Objective measures were completed at Evaluation unless otherwise noted.  COGNITION: Overall cognitive status: Within functional limits for tasks assessed     SENSATION: WFL  POSTURE: Rounded shoulders  UPPER EXTREMITY ROM:   Active ROM Right eval Left eval  Shoulder flexion 70 70 with pain  Shoulder extension    Shoulder abduction 58 with pain 53 with pain  Shoulder adduction    Shoulder internal rotation T7 T7  Shoulder external rotation C5 C5 with pain  Elbow flexion    Elbow extension    Wrist flexion    Wrist extension    Wrist ulnar deviation    Wrist radial deviation    Wrist pronation    Wrist supination    (Blank rows = not tested)  UPPER EXTREMITY MMT:  MMT Right eval Left eval  Shoulder flexion 2-  2- with pain  Shoulder extension    Shoulder abduction 2- 2- with pain  Shoulder adduction    Shoulder internal rotation 4- 4-  Shoulder external rotation 3 2- with pain  Middle trapezius    Lower trapezius    Elbow flexion    Elbow extension    Wrist flexion    Wrist extension    Wrist ulnar  deviation    Wrist radial deviation    Wrist pronation    Wrist supination    Grip strength (lbs)    (Blank rows = not tested)  SHOULDER  SPECIAL TESTS: Impingement tests: Painful arc test: positive  Rotator cuff assessment: Drop arm test: positive  and Empty can test: positive   JOINT MOBILITY TESTING:  Can push into further ranges for PROM, but still not full range  PALPATION:  TTP L shoulder                                                                                                                             TREATMENT DATE:  02/08/24 NuStep L 5 x 6 min AAROM with 1lb WaTE flexion, ext, IR 2x10  Therapist assisted with flexion to increase ROM Rows & Ext blue 2x10. Shoulder abd & flex 2x10  Assist to increase ROM Supine flex 1lb 2x10 1lb Chest press 2x10 Bilateral Shoulder PROM with end range holds  02/06/24 NuStep L 5 x 6 min AAROM with 1lb WaTE flexion, ext, IR 2x10 Rows & Ext green 2x10 Shoulder abd & flex 2x10 AROM ER/IR 2x10 Bilateral Shoulder PROM with end range holds  01/25/24 AAROM with dowel flexion, ext, IR 2x10 Isometrics against PT resistance all directions 5s holds x10 bilaterally  Wall slides x10 PROM bilateral shoulders  Supine chest press 2# 2x10 Yellow band rows and ext 2x10   01/18/24- EVAL   PATIENT EDUCATION: Education details: POC, HEP review Person educated: Patient Education method: Explanation and Demonstration Education comprehension: verbalized understanding  HOME EXERCISE PROGRAM: Access Code: [email or web address] URL: [email or web address] Date: 01/18/2024 Prepared by: Almetta Fam  Exercises - Supine Shoulder Flexion Extension AAROM with Dowel  - 1 x daily - 7 x weekly - 3 sets - 10 reps - Sidelying Shoulder External Rotation  - 1 x daily - 7 x weekly - 3 sets - 10 reps - Standing Shoulder Extension with Dowel  - 1 x daily - 7 x weekly - 3 sets - 10 reps - Supine Shoulder Abduction AAROM with Dowel  - 1 x daily - 7 x  weekly - 3 sets - 10 reps - Isometric Shoulder Flexion at Wall  - 1 x daily - 7 x weekly - 3 sets - 10 reps - 5 hold - Isometric Shoulder External Rotation at Wall  - 1 x daily - 7 x weekly - 3 sets - 10 reps - 5 hold - Isometric Shoulder Extension at Wall  - 1 x daily - 7 x weekly - 3 sets - 10 reps - 5 hold  ASSESSMENT:  CLINICAL IMPRESSION: Continued focused on ROM and light strengthening today. Weakness limited her ability with raise shoulders. Added assistance to flexion and abduction motions to increase ROM and work on actuary.  She is still very limited with mobility bilaterally.  R shoulder elevation noted with rows and extensions.  She will benefit from PT to address her pain, range, strength impairments to allow more ease with ADLs and household activities.   Patient is a 33 y.o. female who was seen today for physical therapy evaluation and treatment for bilateral shoulder impairments. Her left  side gives her more trouble than the R. She had a reverse total on the R in May of 2024 and ORIF in the L humerus back in Feb 2022. Her doctors suggested surgery for the L side but are holding off until she is in better health overall, and possibly after getting a kidney transplant for which she is still on the wait list. Patient has significant ROM deficits mainly with flexion, abduction, and external rotation on both sides. Passively she is able to demonstrate further ranges into flexion and abduction but is lacking muscle strength overall to get decent ranges on her own.   OBJECTIVE IMPAIRMENTS: decreased ROM, decreased strength, impaired UE functional use, improper body mechanics, postural dysfunction, and pain.   ACTIVITY LIMITATIONS: carrying, lifting, reach over head, and hygiene/grooming  PARTICIPATION LIMITATIONS: cleaning, driving, shopping, and community activity  PERSONAL FACTORS: Past/current experiences and Time since onset of injury/illness/exacerbation are also affecting  patient's functional outcome.   REHAB POTENTIAL: Fair    CLINICAL DECISION MAKING: Evolving/moderate complexity  EVALUATION COMPLEXITY: Low  GOALS: Goals reviewed with patient? Yes   SHORT TERM GOALS: Target date: 02/29/24   Patient will be independent with initial HEP.  Baseline:  Goal status: Met 12/2/5   2.  Patient will be able to demonstrate full PROM  Baseline:  Goal status: Ongoing 02/08/24     LONG TERM GOALS: Target date: 04/11/24   Patient will be independent with advanced/ongoing HEP to improve outcomes and carryover.  Baseline:  Goal status: INITIAL   2.  Patient will report 50% improvement in L shoulder pain to improve QOL.  Baseline: 6/10 Goal status: INITIAL   3.  Patient to improve bilateral active shoulder flexion and abduction to 100d or better to allow for increased ease of ADLs.  Baseline:  Goal status: INITIAL   4.  Patient will demonstrate improved functional UE strength by 1 muscle grade in weak groups. Baseline: see chart Goal status: INITIAL  PLAN:  PT FREQUENCY: 2x/week  PT DURATION: 12 weeks  PLANNED INTERVENTIONS: 97110-Therapeutic exercises, 97530- Therapeutic activity, 97112- Neuromuscular re-education, 97535- Self Care, 02859- Manual therapy, 20560 (1-2 muscles), 20561 (3+ muscles)- Dry Needling, Patient/Family education, Taping, Joint mobilization, Spinal mobilization, Cryotherapy, and Moist heat  PLAN FOR NEXT SESSION: AAROM, PROM, isometrics and strengthening as tolerated    Tanda KANDICE Sorrow, PTA 02/08/2024, 1:05 PM

## 2024-02-13 ENCOUNTER — Ambulatory Visit

## 2024-02-15 ENCOUNTER — Encounter: Payer: Self-pay | Admitting: Physical Therapy

## 2024-02-15 ENCOUNTER — Ambulatory Visit: Admitting: Physical Therapy

## 2024-02-15 DIAGNOSIS — G8929 Other chronic pain: Secondary | ICD-10-CM

## 2024-02-15 DIAGNOSIS — Z96611 Presence of right artificial shoulder joint: Secondary | ICD-10-CM

## 2024-02-15 DIAGNOSIS — M6281 Muscle weakness (generalized): Secondary | ICD-10-CM

## 2024-02-15 DIAGNOSIS — M25612 Stiffness of left shoulder, not elsewhere classified: Secondary | ICD-10-CM

## 2024-02-15 NOTE — Therapy (Signed)
 OUTPATIENT PHYSICAL THERAPY SHOULDER TREATMENT   Patient Name: Anne Robinson MRN: [id number] DOB:[birth year], 33 y.o., female Today's Date: 02/15/2024  END OF SESSION:  PT End of Session - 02/15/24 1304     Visit Number 5    Date for Recertification  04/11/24    PT Start Time 1300    PT Stop Time 1345    PT Time Calculation (min) 45 min    Activity Tolerance Patient tolerated treatment well    Behavior During Therapy [facility] for tasks assessed/performed           Past Medical History:  Diagnosis Date   Angio-edema    HTN (hypertension) 05/15/2016   Hypothyroidism    Lupus    Lupus nephritis (HCC)    Pseudotumor cerebri    Renal disorder    Thyroid disease    Past Surgical History:  Procedure Laterality Date   INSERTION OF DIALYSIS CATHETER     RENAL BIOPSY     SHOULDER SURGERY     VENTRICULAR ATRIAL SHUNT Right 11/27/2018   Codman programmable shunt set at 150   Patient Active Problem List   Diagnosis Date Noted   Rhinoconjunctivitis 08/10/2023   ESRD on dialysis (HCC) 09/15/2021   Idiopathic intracranial hypertension 09/15/2021   Immunosuppression due to chronic steroid use 09/15/2021   Anemia of chronic renal failure 09/15/2021   History of DVT (deep vein thrombosis) 09/15/2021   Thrombocytopenia 09/15/2021   Hypothyroidism 09/15/2021   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   Complicated migraine 09/14/2021   Cellulitis of chest wall    Acute renal failure 01/26/2017   Exacerbation of systemic lupus erythematosus (HCC) 01/26/2017   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   Tachypnea 01/24/2017   Dyspnea 01/24/2017   Macular rash 01/24/2017   Abscess of left axilla 01/24/2017   Abscess of axilla, left 01/23/2017   Hypotension    Sepsis (HCC) 05/15/2016   Elevated troponin 05/15/2016   HTN (hypertension) 05/15/2016   SLE (systemic lupus erythematosus) (HCC) 05/15/2016    PCP: Camie Cools  REFERRING PROVIDER: Froylan Finical  REFERRING DIAG:  [phone] (ICD-10-CM) - Other  displaced fracture of upper end of left humerus, initial encounter for closed fracture  Z96.611 (ICD-10-CM) - Presence of right artificial shoulder joint    THERAPY DIAG:  Stiffness of left shoulder, not elsewhere classified  Chronic left shoulder pain  Muscle weakness (generalized)  Status post reverse total replacement of right shoulder  Rationale for Evaluation and Treatment: Rehabilitation  ONSET DATE: chronic  SUBJECTIVE:  SUBJECTIVE STATEMENT: Ok   PERTINENT HISTORY: She has left shoulder proximal humerus ORIF. She has hardware in the left proximal humerus; ORIF proximal humerus with Dr. Georgia 04/09/2020- a displaced left proximal humerus fracture . 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 She had Right reverse TSA but was not been able to do a lot of physical therapy. Her motion is limited. Her left shoulder still hurts, was supposed to get surgery.   1. Thrombosis of arteriovenous fistula, subsequent encounter  2. Hematoma of left thigh, subsequent encounter  3. Clotting disorder (CMD)  4. History of thrombectomy  5. Adrenal insufficiency (CMD)  6. Acquired hypothyroidism  7. Essential hypertension  8. Stress-induced cardiomyopathy  9. Pseudotumor cerebri syndrome  10. Seizure disorder (HCC)  11. ESRD on hemodialysis (HCC)  12. Glomerular disease in systemic lupus erythematosus (HCC)  13. Lupus nephritis, ISN/RPS class IV (HCC)  14. Anemia of chronic renal failure, stage 5 (HCC)  15. History of pulmonary embolus (PE)  16. Chronic lupus erythematosus  17. Hypoalbuminemia  18. Impaired gait and mobility  19. History  of ITP  20. 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 Mixed hyperlipidemia  22. Immunosuppressed status (HCC)   PAIN:  Are you having pain? Yes: NPRS scale: 3/10 Pain location: L shoulder Pain description: throbbing, sharp Aggravating factors: lifting, reaching overhead, laying on it the wrong way Relieving factors: just take muscle relaxer at night  PRECAUTIONS: None  RED FLAGS: None   WEIGHT BEARING RESTRICTIONS: No  FALLS:  Has patient fallen in last 6 months? No  LIVING ENVIRONMENT: Lives with: lives with their family Lives in: House/apartment  OCCUPATION: On disability   PLOF: Independent with basic ADLs  PATIENT GOALS:to be able to get more range and be stronger. Eventually want to be able to drive again   NEXT MD VISIT:   OBJECTIVE:  Note: Objective measures were completed at Evaluation unless otherwise noted.  COGNITION: Overall cognitive status: Within functional limits for tasks assessed     SENSATION: WFL  POSTURE: Rounded shoulders  UPPER EXTREMITY ROM:   Active ROM Right eval Left eval Right 02/15/24 Left 02/15/24  Shoulder flexion 70 70 with pain 72 73  Shoulder extension      Shoulder abduction 58 with pain 53 with pain 62 64  Shoulder adduction      Shoulder internal rotation T7 T7    Shoulder external rotation C5 C5 with pain    Elbow flexion      Elbow extension      Wrist flexion      Wrist extension      Wrist ulnar deviation      Wrist radial deviation      Wrist pronation      Wrist supination      (Blank rows = not tested)  UPPER EXTREMITY MMT:  MMT Right eval Left eval  Shoulder flexion 2-  2- with pain  Shoulder extension    Shoulder abduction 2- 2- with pain  Shoulder adduction    Shoulder internal rotation 4- 4-  Shoulder external rotation 3 2- with pain  Middle trapezius    Lower trapezius    Elbow flexion    Elbow extension    Wrist flexion    Wrist extension    Wrist ulnar deviation    Wrist radial deviation  Wrist pronation    Wrist supination    Grip strength (lbs)    (Blank rows = not tested)  SHOULDER SPECIAL TESTS: Impingement tests: Painful arc test: positive  Rotator cuff assessment: Drop arm test: positive  and Empty can test: positive   JOINT MOBILITY TESTING:  Can push into further ranges for PROM, but still not full range  PALPATION:  TTP L shoulder                                                                                                                             TREATMENT DATE:  02/15/24 NuStep L5 x 6 min Rows and Ext blue 2x10 Triceps ext 20lb 3x10  Biceps Curls 10lb 2x10 Behind the back passes red ball  Sit to stand w/ red ball chest press 2x10 Supine flex 1lb 2x10 1lb Chest press 2x10 Bilateral Shoulder PROM with end range holds  02/08/24 NuStep L 5 x 6 min AAROM with 1lb WaTE flexion, ext, IR 2x10  Therapist assisted with flexion to increase ROM Rows & Ext blue 2x10. Shoulder abd & flex 2x10  Assist to increase ROM Supine flex 1lb 2x10 1lb Chest press 2x10 Bilateral Shoulder PROM with end range holds  02/06/24 NuStep L 5 x 6 min AAROM with 1lb WaTE flexion, ext, IR 2x10 Rows & Ext green 2x10 Shoulder abd & flex 2x10 AROM ER/IR 2x10 Bilateral Shoulder PROM with end range holds  01/25/24 AAROM with dowel flexion, ext, IR 2x10 Isometrics against PT resistance all directions 5s holds x10 bilaterally  Wall slides x10 PROM bilateral shoulders  Supine chest press 2# 2x10 Yellow band rows and ext 2x10   01/18/24- EVAL   PATIENT EDUCATION: Education details: POC, HEP review Person educated: Patient Education method: Explanation and Demonstration Education comprehension: verbalized understanding  HOME EXERCISE PROGRAM: Access Code: [email or web address] URL: [email or web address] Date: 01/18/2024 Prepared by: Almetta Fam  Exercises - Supine Shoulder Flexion Extension AAROM with Dowel  - 1 x daily - 7 x weekly - 3 sets - 10 reps -  Sidelying Shoulder External Rotation  - 1 x daily - 7 x weekly - 3 sets - 10 reps - Standing Shoulder Extension with Dowel  - 1 x daily - 7 x weekly - 3 sets - 10 reps - Supine Shoulder Abduction AAROM with Dowel  - 1 x daily - 7 x weekly - 3 sets - 10 reps - Isometric Shoulder Flexion at Wall  - 1 x daily - 7 x weekly - 3 sets - 10 reps - 5 hold - Isometric Shoulder External Rotation at Wall  - 1 x daily - 7 x weekly - 3 sets - 10 reps - 5 hold - Isometric Shoulder Extension at Wall  - 1 x daily - 7 x weekly - 3 sets - 10 reps - 5 hold  ASSESSMENT:  CLINICAL IMPRESSION: Continued focused on ROM and light strengthening today. Weakness limited her ability with raise shoulders, no much progress  made with AROM.   R shoulder elevation noted with rows and extensions. Some discomfort with biceps curls. She will benefit from PT to address her pain, range, strength impairments to allow more ease with ADLs and household activities.   Patient is a 33 y.o. female who was seen today for physical therapy evaluation and treatment for bilateral shoulder impairments. Her left side gives her more trouble than the R. She had a reverse total on the R in May of 2024 and ORIF in the L humerus back in Feb 2022. Her doctors suggested surgery for the L side but are holding off until she is in better health overall, and possibly after getting a kidney transplant for which she is still on the wait list. Patient has significant ROM deficits mainly with flexion, abduction, and external rotation on both sides. Passively she is able to demonstrate further ranges into flexion and abduction but is lacking muscle strength overall to get decent ranges on her own.   OBJECTIVE IMPAIRMENTS: decreased ROM, decreased strength, impaired UE functional use, improper body mechanics, postural dysfunction, and pain.   ACTIVITY LIMITATIONS: carrying, lifting, reach over head, and hygiene/grooming  PARTICIPATION LIMITATIONS: cleaning, driving,  shopping, and community activity  PERSONAL FACTORS: Past/current experiences and Time since onset of injury/illness/exacerbation are also affecting patient's functional outcome.   REHAB POTENTIAL: Fair    CLINICAL DECISION MAKING: Evolving/moderate complexity  EVALUATION COMPLEXITY: Low  GOALS: Goals reviewed with patient? Yes   SHORT TERM GOALS: Target date: 02/29/24   Patient will be independent with initial HEP.  Baseline:  Goal status: Met 12/2/5   2.  Patient will be able to demonstrate full PROM  Baseline:  Goal status: Ongoing 02/08/24, Progressing 02/15/24     LONG TERM GOALS: Target date: 04/11/24   Patient will be independent with advanced/ongoing HEP to improve outcomes and carryover.  Baseline:  Goal status: INITIAL   2.  Patient will report 50% improvement in L shoulder pain to improve QOL.  Baseline: 6/10 Goal status: INITIAL   3.  Patient to improve bilateral active shoulder flexion and abduction to 100d or better to allow for increased ease of ADLs.  Baseline:  Goal status: INITIAL   4.  Patient will demonstrate improved functional UE strength by 1 muscle grade in weak groups. Baseline: see chart Goal status: INITIAL  PLAN:  PT FREQUENCY: 2x/week  PT DURATION: 12 weeks  PLANNED INTERVENTIONS: 97110-Therapeutic exercises, 97530- Therapeutic activity, 97112- Neuromuscular re-education, 97535- Self Care, 02859- Manual therapy, 20560 (1-2 muscles), 20561 (3+ muscles)- Dry Needling, Patient/Family education, Taping, Joint mobilization, Spinal mobilization, Cryotherapy, and Moist heat  PLAN FOR NEXT SESSION: AAROM, PROM, isometrics and strengthening as tolerated    Tanda KANDICE Sorrow, PTA 02/15/2024, 1:05 PM

## 2024-02-20 ENCOUNTER — Ambulatory Visit

## 2024-02-20 DIAGNOSIS — M25511 Pain in right shoulder: Secondary | ICD-10-CM

## 2024-02-20 DIAGNOSIS — Z96611 Presence of right artificial shoulder joint: Secondary | ICD-10-CM

## 2024-02-20 DIAGNOSIS — G8929 Other chronic pain: Secondary | ICD-10-CM

## 2024-02-20 DIAGNOSIS — R29898 Other symptoms and signs involving the musculoskeletal system: Secondary | ICD-10-CM

## 2024-02-20 DIAGNOSIS — M6281 Muscle weakness (generalized): Secondary | ICD-10-CM

## 2024-02-20 DIAGNOSIS — M25612 Stiffness of left shoulder, not elsewhere classified: Secondary | ICD-10-CM | POA: Diagnosis not present

## 2024-02-20 DIAGNOSIS — M25611 Stiffness of right shoulder, not elsewhere classified: Secondary | ICD-10-CM

## 2024-02-20 NOTE — Therapy (Signed)
 OUTPATIENT PHYSICAL THERAPY SHOULDER TREATMENT   Patient Name: Anne Robinson MRN: [id number] DOB:[birth year], 33 y.o., female Today's Date: 02/20/2024  END OF SESSION:  PT End of Session - 02/20/24 1238     Visit Number 6    Date for Recertification  04/11/24    Authorization Type --    PT Start Time 1232    PT Stop Time 1315    PT Time Calculation (min) 43 min    Activity Tolerance Patient tolerated treatment well    Behavior During Therapy WFL for tasks assessed/performed            Past Medical History:  Diagnosis Date   Angio-edema    HTN (hypertension) 05/15/2016   Hypothyroidism    Lupus    Lupus nephritis (HCC)    Pseudotumor cerebri    Renal disorder    Thyroid disease    Past Surgical History:  Procedure Laterality Date   INSERTION OF DIALYSIS CATHETER     RENAL BIOPSY     SHOULDER SURGERY     VENTRICULAR ATRIAL SHUNT Right 11/27/2018   Codman programmable shunt set at 150   Patient Active Problem List   Diagnosis Date Noted   Rhinoconjunctivitis 08/10/2023   ESRD on dialysis (HCC) 09/15/2021   Idiopathic intracranial hypertension 09/15/2021   Immunosuppression due to chronic steroid use 09/15/2021   Anemia of chronic renal failure 09/15/2021   History of DVT (deep vein thrombosis) 09/15/2021   Thrombocytopenia 09/15/2021   Hypothyroidism 09/15/2021   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   Complicated migraine 09/14/2021   Cellulitis of chest wall    Acute renal failure 01/26/2017   Exacerbation of systemic lupus erythematosus (HCC) 01/26/2017   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   Tachypnea 01/24/2017   Dyspnea 01/24/2017   Macular rash 01/24/2017   Abscess of left axilla 01/24/2017   Abscess of axilla, left 01/23/2017   Hypotension    Sepsis (HCC) 05/15/2016   Elevated troponin 05/15/2016   HTN (hypertension) 05/15/2016   SLE (systemic lupus erythematosus) (HCC) 05/15/2016    PCP: Camie Cools  REFERRING PROVIDER: Froylan Finical  REFERRING DIAG:   [phone] (ICD-10-CM) - Other displaced fracture of upper end of left humerus, initial encounter for closed fracture  Z96.611 (ICD-10-CM) - Presence of right artificial shoulder joint    THERAPY DIAG:  Stiffness of left shoulder, not elsewhere classified  Stiffness of right shoulder, not elsewhere classified  Chronic left shoulder pain  Other symptoms and signs involving the musculoskeletal system  Muscle weakness (generalized)  Acute pain of right shoulder  Status post reverse total replacement of right shoulder  Rationale for Evaluation and Treatment: Rehabilitation  ONSET DATE: chronic  SUBJECTIVE:  SUBJECTIVE STATEMENT: Pt reported feeling okay today some minimal bilateral shoulder pain about a 4/10 upon arrival of PT.    PERTINENT HISTORY: She has left shoulder proximal humerus ORIF. She has hardware in the left proximal humerus; ORIF proximal humerus with Dr. Georgia 04/09/2020- a displaced left proximal humerus fracture . 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 She had Right reverse TSA but was not been able to do a lot of physical therapy. Her motion is limited. Her left shoulder still hurts, was supposed to get surgery.   1. Thrombosis of arteriovenous fistula, subsequent encounter  2. Hematoma of left thigh, subsequent encounter  3. Clotting disorder (CMD)  4. History of thrombectomy  5. Adrenal insufficiency (CMD)  6. Acquired hypothyroidism  7. Essential hypertension  8. Stress-induced cardiomyopathy  9. Pseudotumor cerebri syndrome  10. Seizure disorder (HCC)  11. ESRD on hemodialysis (HCC)  12. Glomerular disease  in systemic lupus erythematosus (HCC)  13. Lupus nephritis, ISN/RPS class IV (HCC)  14. Anemia of chronic renal failure, stage 5 (HCC)  15. History of pulmonary embolus (PE)  16. Chronic lupus erythematosus  17. Hypoalbuminemia  18. Impaired gait and mobility  19. History of ITP  20. 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 Mixed hyperlipidemia  22. Immunosuppressed status (HCC)   PAIN:  Are you having pain? Yes: NPRS scale: 3/10 Pain location: L shoulder Pain description: throbbing, sharp Aggravating factors: lifting, reaching overhead, laying on it the wrong way Relieving factors: just take muscle relaxer at night  PRECAUTIONS: None  RED FLAGS: None   WEIGHT BEARING RESTRICTIONS: No  FALLS:  Has patient fallen in last 6 months? No  LIVING ENVIRONMENT: Lives with: lives with their family Lives in: House/apartment  OCCUPATION: On disability   PLOF: Independent with basic ADLs  PATIENT GOALS:to be able to get more range and be stronger. Eventually want to be able to drive again   NEXT MD VISIT:   OBJECTIVE:  Note: Objective measures were completed at Evaluation unless otherwise noted.  COGNITION: Overall cognitive status: Within functional limits for tasks assessed     SENSATION: WFL  POSTURE: Rounded shoulders  UPPER EXTREMITY ROM:   Active ROM Right eval Left eval Right 02/15/24 Left 02/15/24  Shoulder flexion 70 70 with pain 72 73  Shoulder extension      Shoulder abduction 58 with pain 53 with pain 62 64  Shoulder adduction      Shoulder internal rotation T7 T7    Shoulder external rotation C5 C5 with pain    Elbow flexion      Elbow extension      Wrist flexion      Wrist extension      Wrist ulnar deviation      Wrist radial deviation      Wrist pronation      Wrist supination      (Blank rows = not tested)  UPPER EXTREMITY MMT:  MMT Right eval Left eval  Shoulder flexion 2-  2- with pain  Shoulder extension    Shoulder abduction 2- 2-  with pain  Shoulder adduction    Shoulder internal rotation 4- 4-  Shoulder external rotation 3 2- with pain  Middle trapezius    Lower trapezius    Elbow flexion    Elbow extension    Wrist flexion  Wrist extension    Wrist ulnar deviation    Wrist radial deviation    Wrist pronation    Wrist supination    Grip strength (lbs)    (Blank rows = not tested)  SHOULDER SPECIAL TESTS: Impingement tests: Painful arc test: positive  Rotator cuff assessment: Drop arm test: positive  and Empty can test: positive   JOINT MOBILITY TESTING:  Can push into further ranges for PROM, but still not full range  PALPATION:  TTP L shoulder                                                                                                                             TREATMENT DATE:  02/20/24 NuStep L5 x  Towel wall slides 2x10 Standing shoulder Ext with dowel  Shoulder abduction with dowel 2x10 Stability ball rollouts for shoulder flexion 2x10  Supine shoulder flexion with dowel  Supine chest press 3# Ball toss for shoulder ROM  Resisted bicep curls Ytband 2x10  02/15/24 NuStep L5 x 6 min Rows and Ext blue 2x10 Triceps ext 20lb 3x10  Biceps Curls 10lb 2x10 Behind the back passes red ball  Sit to stand w/ red ball chest press 2x10 Supine flex 1lb 2x10 1lb Chest press 2x10 Bilateral Shoulder PROM with end range holds  02/08/24 NuStep L 5 x 6 min AAROM with 1lb WaTE flexion, ext, IR 2x10  Therapist assisted with flexion to increase ROM Rows & Ext blue 2x10. Shoulder abd & flex 2x10  Assist to increase ROM Supine flex 1lb 2x10 1lb Chest press 2x10 Bilateral Shoulder PROM with end range holds  02/06/24 NuStep L 5 x 6 min AAROM with 1lb WaTE flexion, ext, IR 2x10 Rows & Ext green 2x10 Shoulder abd & flex 2x10 AROM ER/IR 2x10 Bilateral Shoulder PROM with end range holds  01/25/24 AAROM with dowel flexion, ext, IR 2x10 Isometrics against PT resistance all directions 5s  holds x10 bilaterally  Wall slides x10 PROM bilateral shoulders  Supine chest press 2# 2x10 Yellow band rows and ext 2x10   01/18/24- EVAL   PATIENT EDUCATION: Education details: POC, HEP review Person educated: Patient Education method: Explanation and Demonstration Education comprehension: verbalized understanding  HOME EXERCISE PROGRAM: Access Code: [email or web address] URL: [email or web address] Date: 01/18/2024 Prepared by: Almetta Fam  Exercises - Supine Shoulder Flexion Extension AAROM with Dowel  - 1 x daily - 7 x weekly - 3 sets - 10 reps - Sidelying Shoulder External Rotation  - 1 x daily - 7 x weekly - 3 sets - 10 reps - Standing Shoulder Extension with Dowel  - 1 x daily - 7 x weekly - 3 sets - 10 reps - Supine Shoulder Abduction AAROM with Dowel  - 1 x daily - 7 x weekly - 3 sets - 10 reps - Isometric Shoulder Flexion at Wall  - 1 x daily - 7 x weekly - 3 sets - 10 reps - 5 hold - Isometric Shoulder External  Rotation at Wall  - 1 x daily - 7 x weekly - 3 sets - 10 reps - 5 hold - Isometric Shoulder Extension at Wall  - 1 x daily - 7 x weekly - 3 sets - 10 reps - 5 hold  ASSESSMENT:  CLINICAL IMPRESSION: Continued focused on ROM and light strengthening today. Pt with with decreased strength in bilateral UE shoulders. Pt compensates with excessive shoulder shrugging and overactive upper traps with shoulder exercises that target supraspinatus and deltoid muscles. Continue to progress ROM activities and light strengthening.   Patient is a 33 y.o. female who was seen today for physical therapy evaluation and treatment for bilateral shoulder impairments. Her left side gives her more trouble than the R. She had a reverse total on the R in May of 2024 and ORIF in the L humerus back in Feb 2022. Her doctors suggested surgery for the L side but are holding off until she is in better health overall, and possibly after getting a kidney transplant for which she is still on the  wait list. Patient has significant ROM deficits mainly with flexion, abduction, and external rotation on both sides. Passively she is able to demonstrate further ranges into flexion and abduction but is lacking muscle strength overall to get decent ranges on her own.   OBJECTIVE IMPAIRMENTS: decreased ROM, decreased strength, impaired UE functional use, improper body mechanics, postural dysfunction, and pain.   ACTIVITY LIMITATIONS: carrying, lifting, reach over head, and hygiene/grooming  PARTICIPATION LIMITATIONS: cleaning, driving, shopping, and community activity  PERSONAL FACTORS: Past/current experiences and Time since onset of injury/illness/exacerbation are also affecting patient's functional outcome.   REHAB POTENTIAL: Fair    CLINICAL DECISION MAKING: Evolving/moderate complexity  EVALUATION COMPLEXITY: Low  GOALS: Goals reviewed with patient? Yes   SHORT TERM GOALS: Target date: 02/29/24   Patient will be independent with initial HEP.  Baseline:  Goal status: Met 12/2/5   2.  Patient will be able to demonstrate full PROM  Baseline:  Goal status: Ongoing 02/08/24, Progressing 02/15/24     LONG TERM GOALS: Target date: 04/11/24   Patient will be independent with advanced/ongoing HEP to improve outcomes and carryover.  Baseline:  Goal status: MET 02/20/24   2.  Patient will report 50% improvement in L shoulder pain to improve QOL.  Baseline: 6/10 Goal status: IN PROGRESS 3/10 on R side 02/20/24   3.  Patient to improve bilateral active shoulder flexion and abduction to 100d or better to allow for increased ease of ADLs.  Baseline:  Goal status: IN PROGRESS 55d abduction bilaterally today 02/20/24   4.  Patient will demonstrate improved functional UE strength by 1 muscle grade in weak groups. Baseline: see chart Goal status: IN PROGRESS unchanged from eval 02/20/24  PLAN:  PT FREQUENCY: 2x/week  PT DURATION: 12 weeks  PLANNED INTERVENTIONS: 97110-Therapeutic  exercises, 97530- Therapeutic activity, 97112- Neuromuscular re-education, 97535- Self Care, 02859- Manual therapy, 20560 (1-2 muscles), 20561 (3+ muscles)- Dry Needling, Patient/Family education, Taping, Joint mobilization, Spinal mobilization, Cryotherapy, and Moist heat  PLAN FOR NEXT SESSION: AAROM, PROM, isometrics and strengthening as tolerated    Thersia Alder, Student-PT 02/20/2024, 1:24 PM

## 2024-02-22 ENCOUNTER — Encounter: Payer: Self-pay | Admitting: Physical Therapy

## 2024-02-22 ENCOUNTER — Ambulatory Visit: Admitting: Physical Therapy

## 2024-02-22 DIAGNOSIS — Z96611 Presence of right artificial shoulder joint: Secondary | ICD-10-CM

## 2024-02-22 DIAGNOSIS — M6281 Muscle weakness (generalized): Secondary | ICD-10-CM

## 2024-02-22 DIAGNOSIS — G8929 Other chronic pain: Secondary | ICD-10-CM

## 2024-02-22 DIAGNOSIS — M25612 Stiffness of left shoulder, not elsewhere classified: Secondary | ICD-10-CM | POA: Diagnosis not present

## 2024-02-22 DIAGNOSIS — M25611 Stiffness of right shoulder, not elsewhere classified: Secondary | ICD-10-CM

## 2024-02-22 NOTE — Therapy (Signed)
 OUTPATIENT PHYSICAL THERAPY SHOULDER TREATMENT   Patient Name: Anne Robinson MRN: [id number] DOB:November 16, 1990, 33 y.o., female Today's Date: 02/22/2024  END OF SESSION:  PT End of Session - 02/22/24 1300     Visit Number 7    Date for Recertification  04/11/24    PT Start Time 1300    PT Stop Time 1345    PT Time Calculation (min) 45 min    Activity Tolerance Patient tolerated treatment well    Behavior During Therapy [facility] for tasks assessed/performed            Past Medical History:  Diagnosis Date   Angio-edema    HTN (hypertension) 05/15/2016   Hypothyroidism    Lupus    Lupus nephritis (HCC)    Pseudotumor cerebri    Renal disorder    Thyroid disease    Past Surgical History:  Procedure Laterality Date   INSERTION OF DIALYSIS CATHETER     RENAL BIOPSY     SHOULDER SURGERY     VENTRICULAR ATRIAL SHUNT Right 11/27/2018   Codman programmable shunt set at 150   Patient Active Problem List   Diagnosis Date Noted   Rhinoconjunctivitis 08/10/2023   ESRD on dialysis (HCC) 09/15/2021   Idiopathic intracranial hypertension 09/15/2021   Immunosuppression due to chronic steroid use 09/15/2021   Anemia of chronic renal failure 09/15/2021   History of DVT (deep vein thrombosis) 09/15/2021   Thrombocytopenia 09/15/2021   Hypothyroidism 09/15/2021   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   Complicated migraine 09/14/2021   Cellulitis of chest wall    Acute renal failure 01/26/2017   Exacerbation of systemic lupus erythematosus (HCC) 01/26/2017   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   Tachypnea 01/24/2017   Dyspnea 01/24/2017   Macular rash 01/24/2017   Abscess of left axilla 01/24/2017   Abscess of axilla, left 01/23/2017   Hypotension    Sepsis (HCC) 05/15/2016   Elevated troponin 05/15/2016   HTN (hypertension) 05/15/2016   SLE (systemic lupus erythematosus) (HCC) 05/15/2016    PCP: Camie Cools  REFERRING PROVIDER: Froylan Finical  REFERRING DIAG:  [phone] (ICD-10-CM) -  Other displaced fracture of upper end of left humerus, initial encounter for closed fracture  Z96.611 (ICD-10-CM) - Presence of right artificial shoulder joint    THERAPY DIAG:  Stiffness of left shoulder, not elsewhere classified  Stiffness of right shoulder, not elsewhere classified  Chronic left shoulder pain  Muscle weakness (generalized)  Status post reverse total replacement of right shoulder  Rationale for Evaluation and Treatment: Rehabilitation  ONSET DATE: chronic  SUBJECTIVE:  SUBJECTIVE STATEMENT: Its ok    PERTINENT HISTORY: She has left shoulder proximal humerus ORIF. She has hardware in the left proximal humerus; ORIF proximal humerus with Dr. Georgia 04/09/2020- a displaced left proximal humerus fracture . 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 She had Right reverse TSA but was not been able to do a lot of physical therapy. Her motion is limited. Her left shoulder still hurts, was supposed to get surgery.   1. Thrombosis of arteriovenous fistula, subsequent encounter  2. Hematoma of left thigh, subsequent encounter  3. Clotting disorder (CMD)  4. History of thrombectomy  5. Adrenal insufficiency (CMD)  6. Acquired hypothyroidism  7. Essential hypertension  8. Stress-induced cardiomyopathy  9. Pseudotumor cerebri syndrome  10. Seizure disorder (HCC)  11. ESRD on hemodialysis (HCC)  12. Glomerular disease in systemic lupus erythematosus (HCC)  13. Lupus nephritis, ISN/RPS class IV (HCC)  14. Anemia of chronic renal failure, stage 5 (HCC)  15. History of pulmonary embolus (PE)  16. Chronic lupus erythematosus   17. Hypoalbuminemia  18. Impaired gait and mobility  19. History of ITP  20. 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 Mixed hyperlipidemia  22. Immunosuppressed status (HCC)   PAIN:  Are you having pain? Yes: NPRS scale: 3/10 Pain location: L shoulder Pain description: throbbing, sharp Aggravating factors: lifting, reaching overhead, laying on it the wrong way Relieving factors: just take muscle relaxer at night  PRECAUTIONS: None  RED FLAGS: None   WEIGHT BEARING RESTRICTIONS: No  FALLS:  Has patient fallen in last 6 months? No  LIVING ENVIRONMENT: Lives with: lives with their family Lives in: House/apartment  OCCUPATION: On disability   PLOF: Independent with basic ADLs  PATIENT GOALS:to be able to get more range and be stronger. Eventually want to be able to drive again   NEXT MD VISIT:   OBJECTIVE:  Note: Objective measures were completed at Evaluation unless otherwise noted.  COGNITION: Overall cognitive status: Within functional limits for tasks assessed     SENSATION: WFL  POSTURE: Rounded shoulders  UPPER EXTREMITY ROM:   Active ROM Right eval Left eval Right 02/15/24 Left 02/15/24 Right 02/22/24 Left 02/22/24  Shoulder flexion 70 70 with pain 72 73 70 75  Shoulder extension        Shoulder abduction 58 with pain 53 with pain 62 64 60 70  Shoulder adduction        Shoulder internal rotation T7 T7      Shoulder external rotation C5 C5 with pain      Elbow flexion        Elbow extension        Wrist flexion        Wrist extension        Wrist ulnar deviation        Wrist radial deviation        Wrist pronation        Wrist supination        (Blank rows = not tested)  UPPER EXTREMITY MMT:  MMT Right eval Left eval  Shoulder flexion 2-  2- with pain  Shoulder extension    Shoulder abduction 2- 2- with pain  Shoulder adduction    Shoulder internal rotation 4- 4-  Shoulder external rotation 3 2- with pain  Middle trapezius    Lower  trapezius    Elbow flexion    Elbow extension    Wrist flexion    Wrist extension    Wrist ulnar deviation    Wrist radial deviation    Wrist pronation    Wrist supination    Grip strength (lbs)    (Blank rows = not tested)  SHOULDER SPECIAL TESTS: Impingement tests: Painful arc test: positive  Rotator cuff assessment: Drop arm test: positive  and Empty can test: positive   JOINT MOBILITY TESTING:  Can push into further ranges for PROM, but still not full range  PALPATION:  TTP L shoulder                                                                                                                             TREATMENT DATE:  02/22/24 NuStep L5 x  Seated rows 20lb 2x10 Lat pull downs 20lb 2x10 Shoulder Ext green 2x15 Chest press 5lb 2x10 AAROM 2lb WaTE flex 2x5 w/ assist cues to control the eccentric  Supine 1lb shoulder flex 2x10 Bilateral UE AROM    02/20/24 NuStep L5 x  Towel wall slides 2x10 Standing shoulder Ext with dowel  Shoulder abduction with dowel 2x10 Stability ball rollouts for shoulder flexion 2x10  Supine shoulder flexion with dowel  Supine chest press 3# Ball toss for shoulder ROM  Resisted bicep curls Ytband 2x10  02/15/24 NuStep L5 x 6 min Rows and Ext blue 2x10 Triceps ext 20lb 3x10  Biceps Curls 10lb 2x10 Behind the back passes red ball  Sit to stand w/ red ball chest press 2x10 Supine flex 1lb 2x10 1lb Chest press 2x10 Bilateral Shoulder PROM with end range holds  02/08/24 NuStep L 5 x 6 min AAROM with 1lb WaTE flexion, ext, IR 2x10  Therapist assisted with flexion to increase ROM Rows & Ext blue 2x10. Shoulder abd & flex 2x10  Assist to increase ROM Supine flex 1lb 2x10 1lb Chest press 2x10 Bilateral Shoulder PROM with end range holds  02/06/24 NuStep L 5 x 6 min AAROM with 1lb WaTE flexion, ext, IR 2x10 Rows & Ext green 2x10 Shoulder abd & flex 2x10 AROM ER/IR 2x10 Bilateral Shoulder PROM with end range  holds  01/25/24 AAROM with dowel flexion, ext, IR 2x10 Isometrics against PT resistance all directions 5s holds x10 bilaterally  Wall slides x10 PROM bilateral shoulders  Supine chest press 2# 2x10 Yellow band rows and ext 2x10   01/18/24- EVAL   PATIENT EDUCATION: Education details: POC, HEP review Person educated: Patient Education method: Explanation and Demonstration Education comprehension: verbalized understanding  HOME EXERCISE PROGRAM: Access Code: [email or web address] URL: [email or web address] Date: 01/18/2024 Prepared by: Almetta Fam  Exercises - Supine Shoulder Flexion Extension AAROM with Dowel  - 1 x daily - 7 x weekly - 3 sets - 10 reps - Sidelying Shoulder External Rotation  - 1 x daily - 7 x weekly - 3 sets - 10 reps - Standing Shoulder Extension with  Dowel  - 1 x daily - 7 x weekly - 3 sets - 10 reps - Supine Shoulder Abduction AAROM with Dowel  - 1 x daily - 7 x weekly - 3 sets - 10 reps - Isometric Shoulder Flexion at Wall  - 1 x daily - 7 x weekly - 3 sets - 10 reps - 5 hold - Isometric Shoulder External Rotation at Wall  - 1 x daily - 7 x weekly - 3 sets - 10 reps - 5 hold - Isometric Shoulder Extension at Wall  - 1 x daily - 7 x weekly - 3 sets - 10 reps - 5 hold  ASSESSMENT:  CLINICAL IMPRESSION: Continued focused on ROM and light strengthening today. Progressed to machine level interventions without issue. Pt with with decreased strength in bilateral UE shoulders. Pt compensates with excessive shoulder shrugging and overactive upper traps with shoulder exercises that target supraspinatus and deltoid muscles. Pt has some LUE AROM improvement. She reports better UE function at home. Continue to progress ROM activities and light strengthening.   Patient is a 33 y.o. female who was seen today for physical therapy evaluation and treatment for bilateral shoulder impairments. Her left side gives her more trouble than the R. She had a reverse total on the R  in May of 2024 and ORIF in the L humerus back in Feb 2022. Her doctors suggested surgery for the L side but are holding off until she is in better health overall, and possibly after getting a kidney transplant for which she is still on the wait list. Patient has significant ROM deficits mainly with flexion, abduction, and external rotation on both sides. Passively she is able to demonstrate further ranges into flexion and abduction but is lacking muscle strength overall to get decent ranges on her own.   OBJECTIVE IMPAIRMENTS: decreased ROM, decreased strength, impaired UE functional use, improper body mechanics, postural dysfunction, and pain.   ACTIVITY LIMITATIONS: carrying, lifting, reach over head, and hygiene/grooming  PARTICIPATION LIMITATIONS: cleaning, driving, shopping, and community activity  PERSONAL FACTORS: Past/current experiences and Time since onset of injury/illness/exacerbation are also affecting patient's functional outcome.   REHAB POTENTIAL: Fair    CLINICAL DECISION MAKING: Evolving/moderate complexity  EVALUATION COMPLEXITY: Low  GOALS: Goals reviewed with patient? Yes   SHORT TERM GOALS: Target date: 02/29/24   Patient will be independent with initial HEP.  Baseline:  Goal status: Met 12/2/5   2.  Patient will be able to demonstrate full PROM  Baseline:  Goal status: Ongoing 02/08/24, Progressing 02/15/24     LONG TERM GOALS: Target date: 04/11/24   Patient will be independent with advanced/ongoing HEP to improve outcomes and carryover.  Baseline:  Goal status: MET 02/20/24   2.  Patient will report 50% improvement in L shoulder pain to improve QOL.  Baseline: 6/10 Goal status: IN PROGRESS 3/10 on R side 02/20/24   3.  Patient to improve bilateral active shoulder flexion and abduction to 100d or better to allow for increased ease of ADLs.  Baseline:  Goal status: IN PROGRESS 55d abduction bilaterally today 02/20/24   4.  Patient will demonstrate  improved functional UE strength by 1 muscle grade in weak groups. Baseline: see chart Goal status: IN PROGRESS unchanged from eval 02/20/24  PLAN:  PT FREQUENCY: 2x/week  PT DURATION: 12 weeks  PLANNED INTERVENTIONS: 97110-Therapeutic exercises, 97530- Therapeutic activity, 97112- Neuromuscular re-education, 97535- Self Care, 02859- Manual therapy, 20560 (1-2 muscles), 20561 (3+ muscles)- Dry Needling, Patient/Family education, Taping, Joint  mobilization, Spinal mobilization, Cryotherapy, and Moist heat  PLAN FOR NEXT SESSION: AAROM, PROM, isometrics and strengthening as tolerated    Tanda KANDICE Sorrow, PTA 02/22/2024, 1:05 PM
